# Patient Record
Sex: Female | Born: 1935 | Race: Black or African American | Hispanic: No | Smoking: Never smoker
Health system: Southern US, Community
[De-identification: ages and names within clinical notes are randomized; demographics above are authoritative.]

## PROBLEM LIST (undated history)

## (undated) DIAGNOSIS — M1990 Unspecified osteoarthritis, unspecified site: Secondary | ICD-10-CM

## (undated) DIAGNOSIS — C801 Malignant (primary) neoplasm, unspecified: Secondary | ICD-10-CM

## (undated) DIAGNOSIS — Z5189 Encounter for other specified aftercare: Secondary | ICD-10-CM

## (undated) DIAGNOSIS — N289 Disorder of kidney and ureter, unspecified: Secondary | ICD-10-CM

## (undated) DIAGNOSIS — Z992 Dependence on renal dialysis: Secondary | ICD-10-CM

## (undated) DIAGNOSIS — Z973 Presence of spectacles and contact lenses: Secondary | ICD-10-CM

## (undated) DIAGNOSIS — N186 End stage renal disease: Secondary | ICD-10-CM

## (undated) DIAGNOSIS — I10 Essential (primary) hypertension: Secondary | ICD-10-CM

## (undated) HISTORY — PX: COLONOSCOPY: SHX174

## (undated) HISTORY — DX: Unspecified osteoarthritis, unspecified site: M19.90

## (undated) HISTORY — DX: Encounter for other specified aftercare: Z51.89

## (undated) HISTORY — PX: APPENDECTOMY: SHX54

## (undated) HISTORY — PX: BREAST SURGERY: SHX581

## (undated) HISTORY — DX: Malignant (primary) neoplasm, unspecified: C80.1

## (undated) HISTORY — PX: ABDOMINAL HYSTERECTOMY: SHX81

## (undated) HISTORY — PX: TONSILLECTOMY: SUR1361

---

## 1999-05-04 ENCOUNTER — Other Ambulatory Visit: Admission: RE | Admit: 1999-05-04 | Discharge: 1999-05-04 | Payer: Self-pay | Admitting: Radiology

## 1999-05-04 ENCOUNTER — Encounter (INDEPENDENT_AMBULATORY_CARE_PROVIDER_SITE_OTHER): Payer: Self-pay | Admitting: Specialist

## 1999-05-22 HISTORY — PX: BREAST LUMPECTOMY WITH AXILLARY LYMPH NODE BIOPSY: SHX5593

## 1999-05-29 ENCOUNTER — Encounter (HOSPITAL_BASED_OUTPATIENT_CLINIC_OR_DEPARTMENT_OTHER): Payer: Self-pay | Admitting: General Surgery

## 1999-05-31 ENCOUNTER — Encounter (INDEPENDENT_AMBULATORY_CARE_PROVIDER_SITE_OTHER): Payer: Self-pay

## 1999-05-31 ENCOUNTER — Encounter (HOSPITAL_BASED_OUTPATIENT_CLINIC_OR_DEPARTMENT_OTHER): Payer: Self-pay | Admitting: General Surgery

## 1999-05-31 ENCOUNTER — Ambulatory Visit (HOSPITAL_COMMUNITY): Admission: RE | Admit: 1999-05-31 | Discharge: 1999-05-31 | Payer: Self-pay | Admitting: General Surgery

## 1999-06-28 ENCOUNTER — Encounter: Admission: RE | Admit: 1999-06-28 | Discharge: 1999-09-26 | Payer: Self-pay | Admitting: Radiation Oncology

## 2001-04-18 ENCOUNTER — Emergency Department (HOSPITAL_COMMUNITY): Admission: EM | Admit: 2001-04-18 | Discharge: 2001-04-19 | Payer: Self-pay | Admitting: *Deleted

## 2002-07-24 ENCOUNTER — Encounter: Payer: Self-pay | Admitting: Internal Medicine

## 2002-07-24 ENCOUNTER — Encounter: Admission: RE | Admit: 2002-07-24 | Discharge: 2002-07-24 | Payer: Self-pay | Admitting: Internal Medicine

## 2002-12-01 ENCOUNTER — Ambulatory Visit (HOSPITAL_COMMUNITY): Admission: RE | Admit: 2002-12-01 | Discharge: 2002-12-01 | Payer: Self-pay | Admitting: *Deleted

## 2002-12-01 ENCOUNTER — Encounter (INDEPENDENT_AMBULATORY_CARE_PROVIDER_SITE_OTHER): Payer: Self-pay | Admitting: Specialist

## 2004-07-11 ENCOUNTER — Ambulatory Visit: Payer: Self-pay | Admitting: Oncology

## 2004-08-04 ENCOUNTER — Ambulatory Visit (HOSPITAL_COMMUNITY): Admission: RE | Admit: 2004-08-04 | Discharge: 2004-08-04 | Payer: Self-pay | Admitting: *Deleted

## 2004-08-04 ENCOUNTER — Ambulatory Visit (HOSPITAL_BASED_OUTPATIENT_CLINIC_OR_DEPARTMENT_OTHER): Admission: RE | Admit: 2004-08-04 | Discharge: 2004-08-04 | Payer: Self-pay | Admitting: *Deleted

## 2009-11-14 ENCOUNTER — Ambulatory Visit: Payer: Self-pay | Admitting: Surgery

## 2009-12-13 ENCOUNTER — Ambulatory Visit: Payer: Self-pay | Admitting: Surgery

## 2009-12-13 ENCOUNTER — Ambulatory Visit (HOSPITAL_COMMUNITY): Admission: RE | Admit: 2009-12-13 | Discharge: 2009-12-13 | Payer: Self-pay | Admitting: Surgery

## 2010-01-02 ENCOUNTER — Ambulatory Visit: Payer: Self-pay | Admitting: Surgery

## 2010-01-30 ENCOUNTER — Ambulatory Visit: Payer: Self-pay | Admitting: Surgery

## 2010-02-20 ENCOUNTER — Ambulatory Visit: Payer: Self-pay | Admitting: Surgery

## 2010-03-31 ENCOUNTER — Encounter: Payer: Self-pay | Admitting: Emergency Medicine

## 2010-03-31 ENCOUNTER — Inpatient Hospital Stay (HOSPITAL_COMMUNITY): Admission: EM | Admit: 2010-03-31 | Discharge: 2010-04-18 | Payer: Self-pay | Admitting: Internal Medicine

## 2010-03-31 ENCOUNTER — Ambulatory Visit: Payer: Self-pay | Admitting: Emergency Medicine

## 2010-04-03 ENCOUNTER — Ambulatory Visit: Payer: Self-pay | Admitting: Vascular Surgery

## 2010-04-04 ENCOUNTER — Encounter: Payer: Self-pay | Admitting: Surgery

## 2010-05-16 ENCOUNTER — Emergency Department (HOSPITAL_COMMUNITY)
Admission: EM | Admit: 2010-05-16 | Discharge: 2010-05-16 | Payer: Self-pay | Source: Home / Self Care | Admitting: Emergency Medicine

## 2010-05-24 ENCOUNTER — Ambulatory Visit (HOSPITAL_COMMUNITY)
Admission: RE | Admit: 2010-05-24 | Discharge: 2010-05-24 | Payer: Self-pay | Source: Home / Self Care | Attending: Nephrology | Admitting: Nephrology

## 2010-05-24 ENCOUNTER — Emergency Department (HOSPITAL_COMMUNITY)
Admission: EM | Admit: 2010-05-24 | Discharge: 2010-05-24 | Payer: Self-pay | Source: Home / Self Care | Admitting: Emergency Medicine

## 2010-06-11 ENCOUNTER — Encounter: Payer: Self-pay | Admitting: Internal Medicine

## 2010-06-29 ENCOUNTER — Ambulatory Visit (INDEPENDENT_AMBULATORY_CARE_PROVIDER_SITE_OTHER): Payer: Medicare Other | Admitting: Vascular Surgery

## 2010-06-29 ENCOUNTER — Encounter (INDEPENDENT_AMBULATORY_CARE_PROVIDER_SITE_OTHER): Payer: Medicare Other

## 2010-06-29 DIAGNOSIS — N186 End stage renal disease: Secondary | ICD-10-CM

## 2010-06-29 DIAGNOSIS — T82898A Other specified complication of vascular prosthetic devices, implants and grafts, initial encounter: Secondary | ICD-10-CM

## 2010-07-03 NOTE — Assessment & Plan Note (Signed)
OFFICE VISIT  Pam, Fuller DOB:  [birth year]                                       06/29/2010 ZOXWR#:[id number]  CHIEF COMPLAINT:  Poorly functioning AV fistula.  HISTORY OF PRESENT ILLNESS:  The patient is a 77 year old female who previously had a left radiocephalic AV fistula placed in July of 2011. The fistula has never really functioned well and has had fairly poor flow.  She recently required catheter placement by Dr. Arbie Cookey in November of 2011.  She presents today for consideration for revision or placement of new access.  She currently dialyzes on Monday, Wednesday and Friday.  She has had no problems with her catheter during dialysis.  REVIEW OF SYSTEMS:  She denies any shortness of breath.  She denies any chest pain.  She denies any problems with prolonged bleeding.  PHYSICAL EXAM:  Blood pressure is 123/80 in the right arm, heart rate 102 and regular, respirations 16.  She has 2+ brachial and radial pulses bilaterally.  Clinically she has a thrombosed AV fistula in the left forearm.  She had a duplex ultrasound of her left upper extremity today which showed that the left forearm AV fistula is occluded.  However, the upper arm cephalic vein was patent.  The vein in the upper arm is fairly small between 24 and 30 mm in diameter.  On palpation with placement of a tourniquet again the vein does not seem large enough to consider for an AV fistula.  I believe the best option at this point would be to place an AV graft in her left upper extremity.  Will schedule this within the next few weeks.  Risks, benefits, possible complications and procedure details were explained to the patient including but not limited to bleeding, infection, graft thrombosis, ischemic steal.  She understands and agrees to proceed.    Janetta Hora. Fields, MD Electronically Signed  CEF/MEDQ  D:  06/30/2010  T:  06/30/2010  Job:  [id number]  cc:   Terrial Rhodes, M.D.

## 2010-07-14 NOTE — Procedures (Unsigned)
VASCULAR LAB EXAM  INDICATION:  Duplex/evaluation of left AV fistula.  HISTORY: Diabetes: Cardiac: Hypertension:  EXAM:  Left arm AV fistula with duplex.  IMPRESSION: 1. The left radial to cephalic AV fistula appears occluded from the     anastomosis to the proximal forearm level of the outflow vein.  The     cephalic vein from the antecubital fossa to the shoulder level     appears patent and compressible with nonpulsatile, spontaneous     phasic venous flow. 2. The left radial artery at the distal forearm and wrist levels     appears to be partially occluded with dampened Doppler waveforms     noted. 3.  Additional  measurements noted on the attached worksheet.   ___________________________________________ Janetta Hora. Fields, MD  CH/MEDQ  D:  06/30/2010  T:  06/30/2010  Job:  [id number]

## 2010-07-18 ENCOUNTER — Ambulatory Visit (HOSPITAL_COMMUNITY)
Admission: RE | Admit: 2010-07-18 | Discharge: 2010-07-18 | Disposition: A | Discharge status: Home / Self Care | Payer: Medicare Other | Source: Ambulatory Visit | Attending: Vascular Surgery | Admitting: Vascular Surgery

## 2010-07-18 DIAGNOSIS — D649 Anemia, unspecified: Secondary | ICD-10-CM | POA: Insufficient documentation

## 2010-07-18 DIAGNOSIS — I120 Hypertensive chronic kidney disease with stage 5 chronic kidney disease or end stage renal disease: Secondary | ICD-10-CM

## 2010-07-18 DIAGNOSIS — Z992 Dependence on renal dialysis: Secondary | ICD-10-CM | POA: Insufficient documentation

## 2010-07-18 DIAGNOSIS — N186 End stage renal disease: Secondary | ICD-10-CM

## 2010-07-18 DIAGNOSIS — Z01812 Encounter for preprocedural laboratory examination: Secondary | ICD-10-CM | POA: Insufficient documentation

## 2010-07-18 DIAGNOSIS — M109 Gout, unspecified: Secondary | ICD-10-CM | POA: Insufficient documentation

## 2010-07-18 LAB — POCT I-STAT 4, (NA,K, GLUC, HGB,HCT)
Glucose, Bld: 102 mg/dL — ABNORMAL HIGH (ref 70–99)
HCT: 41 % (ref 36.0–46.0)
Hemoglobin: 13.9 g/dL (ref 12.0–15.0)

## 2010-07-18 LAB — SURGICAL PCR SCREEN: MRSA, PCR: NEGATIVE

## 2010-07-24 NOTE — Op Note (Signed)
NAMEMEHR, DEPAOLI             ACCOUNT NO.:  [id number]  MEDICAL RECORD NO.:  [id number]           PATIENT TYPE:  O  LOCATION:  [facility]SC                         FACILITY:  [facility]  PHYSICIAN:  Janetta Hora. Fields, MD  DATE OF BIRTH:  1932-07-13  DATE OF PROCEDURE:  07/18/2010 DATE OF DISCHARGE:  07/18/2010                              OPERATIVE REPORT   PROCEDURE:  Left forearm arteriovenous graft.  PREOPERATIVE DIAGNOSIS:  End-stage renal disease.  POSTOPERATIVE DIAGNOSIS:  End-stage renal disease.  ANESTHESIA:  Local with IV sedation.  ASSISTANT:  Della Goo, PA-C  OPERATIVE FINDINGS:  A 4 to 7-mm tapered PTFE graft.  OPERATIVE DETAILS:  After obtaining informed consent, the patient was taken to the operating room.  The patient was placed in supine position on the operating room table.  After adequate sedation, the patient's entire left upper extremity was prepped and draped in usual sterile fashion.  Local anesthesia was infiltrated near the antecubital crease. A transverse incision was made in this location, carried down through subcutaneous tissues.  Cephalic vein was quite small and not adequate for use for creation of a fistula.  Laterally the basilic vein was examined and this was approximately 2 to 2.5 mm in diameter and I did not believe that this was a reasonable vein for creation of a basilic vein transposition.  The brachial artery was then dissected free more medial to this.  There was an adjacent deep brachial vein which was also 2.5 mm in diameter but was straighter with fewer side branches and appeared to be a better quality vein than the basilic vein.  This was dissected free circumferentially as well as the brachial artery.  Next, a loop configuration tunnel was created in the forearm with a transverse incision in the distal forearm for assistance and tunneling and a 4 to 7- mm tapered PTFE graft brought through the subcutaneous tunnel; 4 mm end of the  graft was on the ulnar aspect of the arm, 7-mm end on the radial aspect of the arm.  The patient was given 5000 units of intravenous heparin.  Vessel loops were used to control the artery proximally and distally.  A longitudinal opening was made in the brachial artery.  There was some calcification of the artery but overall it was soft in character; 4 mm end of the graft was beveled and sewn end graft to side of artery using a running 6- 0 Prolene suture.  Just prior to completion of anastomosis, this was fore bled, back bled and thoroughly flushed.  Anastomosis was secured, clamps released as pulsatile flow in the graft immediately.  Next, two small bulldog clamps were used to control the vein proximally and distally.  Longitudinal opening was made in the vein and the graft was beveled and sewn end of graft to side of vein using running 6-0 Prolene suture.  Just prior to completion of anastomosis, this was fore bled, back bled, and thoroughly flushed where anastomosis was secured, clamps were released, and there was palpable thrill above the graft immediately.  Hemostasis was then obtained with thrombin and Gelfoam and 50 mg of protamine.  After  hemostasis was obtained, the subcutaneous tissues of both incisions were reapproximated using running 3-0 Vicryl suture in the subcutaneous layer and a 4-0 Vicryl subcuticular stitch in the skin.  Dermabond was applied to both incisions.  The patient tolerated the procedure well and there were no complications.  Instrument, sponge, and needle counts were correct at the end of the case.  The patient was taken to the recovery room in stable condition.     Janetta Hora. Fields, MD     CEF/MEDQ  D:  07/18/2010  T:  07/19/2010  Job:  [id number]  Electronically Signed by Fabienne Bruns MD on 07/24/2010 11:02:27 AM

## 2010-07-31 LAB — CBC
MCH: 30.5 pg (ref 26.0–34.0)
Platelets: 134 10*3/uL — ABNORMAL LOW (ref 150–400)
RBC: 4.26 MIL/uL (ref 3.87–5.11)
RDW: 21.1 % — ABNORMAL HIGH (ref 11.5–15.5)
WBC: 7 10*3/uL (ref 4.0–10.5)

## 2010-07-31 LAB — BASIC METABOLIC PANEL
BUN: 14 mg/dL (ref 6–23)
Calcium: 9 mg/dL (ref 8.4–10.5)
Creatinine, Ser: 7.24 mg/dL — ABNORMAL HIGH (ref 0.4–1.2)
GFR calc Af Amer: 7 mL/min — ABNORMAL LOW (ref >60)

## 2010-07-31 LAB — POCT CARDIAC MARKERS
CKMB, poc: 2.5 ng/mL (ref 1.0–8.0)
Troponin i, poc: <0.05 ng/mL (ref 0.00–0.09)

## 2010-08-01 LAB — URINE CULTURE: Culture: NO GROWTH

## 2010-08-01 LAB — DIFFERENTIAL
Basophils Absolute: 0 10*3/uL (ref 0.0–0.1)
Lymphocytes Relative: 20 % (ref 12–46)
Monocytes Absolute: 0.1 10*3/uL (ref 0.1–1.0)
Neutro Abs: 5 10*3/uL (ref 1.7–7.7)
Neutrophils Relative %: 77 % (ref 43–77)

## 2010-08-01 LAB — URINALYSIS, ROUTINE W REFLEX MICROSCOPIC
Bilirubin Urine: NEGATIVE
Ketones, ur: NEGATIVE mg/dL
Nitrite: NEGATIVE
Specific Gravity, Urine: 1.013 (ref 1.005–1.030)
pH: 6 (ref 5.0–8.0)

## 2010-08-01 LAB — URINE MICROSCOPIC-ADD ON

## 2010-08-01 LAB — COMPREHENSIVE METABOLIC PANEL
Albumin: 4.1 g/dL (ref 3.5–5.2)
BUN: 97 mg/dL — ABNORMAL HIGH (ref 6–23)
Chloride: 103 mEq/L (ref 96–112)
Creatinine, Ser: 9.31 mg/dL — ABNORMAL HIGH (ref 0.4–1.2)
Total Bilirubin: 0.5 mg/dL (ref 0.3–1.2)

## 2010-08-01 LAB — CBC
MCH: 29.2 pg (ref 26.0–34.0)
MCV: 90.6 fL (ref 78.0–100.0)
Platelets: 246 10*3/uL (ref 150–400)
RBC: 4.62 MIL/uL (ref 3.87–5.11)
RDW: 18.2 % — ABNORMAL HIGH (ref 11.5–15.5)

## 2010-08-01 LAB — LIPASE, BLOOD: Lipase: 30 U/L (ref 11–59)

## 2010-08-02 LAB — CBC
HCT: 23.6 % — ABNORMAL LOW (ref 36.0–46.0)
HCT: 30.5 % — ABNORMAL LOW (ref 36.0–46.0)
HCT: 34.2 % — ABNORMAL LOW (ref 36.0–46.0)
HCT: 34.3 % — ABNORMAL LOW (ref 36.0–46.0)
HCT: 34.8 % — ABNORMAL LOW (ref 36.0–46.0)
HCT: 36.1 % (ref 36.0–46.0)
Hemoglobin: 10.4 g/dL — ABNORMAL LOW (ref 12.0–15.0)
Hemoglobin: 8.1 g/dL — ABNORMAL LOW (ref 12.0–15.0)
Hemoglobin: 8.7 g/dL — ABNORMAL LOW (ref 12.0–15.0)
Hemoglobin: 9.5 g/dL — ABNORMAL LOW (ref 12.0–15.0)
Hemoglobin: 9.7 g/dL — ABNORMAL LOW (ref 12.0–15.0)
MCH: 27.5 pg (ref 26.0–34.0)
MCH: 27.7 pg (ref 26.0–34.0)
MCH: 27.8 pg (ref 26.0–34.0)
MCH: 27.9 pg (ref 26.0–34.0)
MCH: 28 pg (ref 26.0–34.0)
MCH: 28.2 pg (ref 26.0–34.0)
MCH: 28.8 pg (ref 26.0–34.0)
MCHC: 30.6 g/dL (ref 30.0–36.0)
MCHC: 30.7 g/dL (ref 30.0–36.0)
MCHC: 30.8 g/dL (ref 30.0–36.0)
MCHC: 30.9 g/dL (ref 30.0–36.0)
MCHC: 31.7 g/dL (ref 30.0–36.0)
MCHC: 31.7 g/dL (ref 30.0–36.0)
MCHC: 31.8 g/dL (ref 30.0–36.0)
MCHC: 32.2 g/dL (ref 30.0–36.0)
MCV: 87 fL (ref 78.0–100.0)
MCV: 87.9 fL (ref 78.0–100.0)
MCV: 88.1 fL (ref 78.0–100.0)
MCV: 90.3 fL (ref 78.0–100.0)
MCV: 90.4 fL (ref 78.0–100.0)
MCV: 90.4 fL (ref 78.0–100.0)
Platelets: 187 10*3/uL (ref 150–400)
Platelets: 196 10*3/uL (ref 150–400)
Platelets: 209 10*3/uL (ref 150–400)
Platelets: 216 10*3/uL (ref 150–400)
Platelets: 231 10*3/uL (ref 150–400)
Platelets: 232 10*3/uL (ref 150–400)
Platelets: 254 10*3/uL (ref 150–400)
Platelets: 329 10*3/uL (ref 150–400)
Platelets: 344 10*3/uL (ref 150–400)
Platelets: 360 10*3/uL (ref 150–400)
RBC: 2.6 MIL/uL — ABNORMAL LOW (ref 3.87–5.11)
RBC: 2.61 MIL/uL — ABNORMAL LOW (ref 3.87–5.11)
RBC: 2.9 MIL/uL — ABNORMAL LOW (ref 3.87–5.11)
RBC: 3.02 MIL/uL — ABNORMAL LOW (ref 3.87–5.11)
RBC: 3.46 MIL/uL — ABNORMAL LOW (ref 3.87–5.11)
RBC: 3.85 MIL/uL — ABNORMAL LOW (ref 3.87–5.11)
RDW: 17.1 % — ABNORMAL HIGH (ref 11.5–15.5)
RDW: 17.2 % — ABNORMAL HIGH (ref 11.5–15.5)
RDW: 17.2 % — ABNORMAL HIGH (ref 11.5–15.5)
RDW: 17.3 % — ABNORMAL HIGH (ref 11.5–15.5)
RDW: 17.3 % — ABNORMAL HIGH (ref 11.5–15.5)
RDW: 17.3 % — ABNORMAL HIGH (ref 11.5–15.5)
RDW: 17.4 % — ABNORMAL HIGH (ref 11.5–15.5)
RDW: 17.5 % — ABNORMAL HIGH (ref 11.5–15.5)
RDW: 17.7 % — ABNORMAL HIGH (ref 11.5–15.5)
RDW: 17.8 % — ABNORMAL HIGH (ref 11.5–15.5)
WBC: 12.2 10*3/uL — ABNORMAL HIGH (ref 4.0–10.5)
WBC: 12.2 10*3/uL — ABNORMAL HIGH (ref 4.0–10.5)
WBC: 20.9 10*3/uL — ABNORMAL HIGH (ref 4.0–10.5)
WBC: 5.5 10*3/uL (ref 4.0–10.5)
WBC: 5.8 10*3/uL (ref 4.0–10.5)
WBC: 6.8 10*3/uL (ref 4.0–10.5)
WBC: 9 10*3/uL (ref 4.0–10.5)
WBC: 9.6 10*3/uL (ref 4.0–10.5)
WBC: 9.8 10*3/uL (ref 4.0–10.5)

## 2010-08-02 LAB — RENAL FUNCTION PANEL
Albumin: 2.2 g/dL — ABNORMAL LOW (ref 3.5–5.2)
Albumin: 2.7 g/dL — ABNORMAL LOW (ref 3.5–5.2)
Albumin: 2.9 g/dL — ABNORMAL LOW (ref 3.5–5.2)
Albumin: 3.3 g/dL — ABNORMAL LOW (ref 3.5–5.2)
BUN: 114 mg/dL — ABNORMAL HIGH (ref 6–23)
BUN: 122 mg/dL — ABNORMAL HIGH (ref 6–23)
BUN: 34 mg/dL — ABNORMAL HIGH (ref 6–23)
BUN: 71 mg/dL — ABNORMAL HIGH (ref 6–23)
BUN: 73 mg/dL — ABNORMAL HIGH (ref 6–23)
CO2: 20 mEq/L (ref 19–32)
CO2: 20 mEq/L (ref 19–32)
CO2: 24 mEq/L (ref 19–32)
CO2: 24 mEq/L (ref 19–32)
CO2: 26 mEq/L (ref 19–32)
Calcium: 7 mg/dL — ABNORMAL LOW (ref 8.4–10.5)
Calcium: 7.2 mg/dL — ABNORMAL LOW (ref 8.4–10.5)
Calcium: 7.6 mg/dL — ABNORMAL LOW (ref 8.4–10.5)
Calcium: 7.7 mg/dL — ABNORMAL LOW (ref 8.4–10.5)
Calcium: 8.2 mg/dL — ABNORMAL LOW (ref 8.4–10.5)
Calcium: 8.3 mg/dL — ABNORMAL LOW (ref 8.4–10.5)
Calcium: 8.6 mg/dL (ref 8.4–10.5)
Chloride: 89 mEq/L — ABNORMAL LOW (ref 96–112)
Chloride: 90 mEq/L — ABNORMAL LOW (ref 96–112)
Chloride: 90 mEq/L — ABNORMAL LOW (ref 96–112)
Creatinine, Ser: 6.8 mg/dL — ABNORMAL HIGH (ref 0.4–1.2)
Creatinine, Ser: 7 mg/dL — ABNORMAL HIGH (ref 0.4–1.2)
Creatinine, Ser: 7.55 mg/dL — ABNORMAL HIGH (ref 0.4–1.2)
Creatinine, Ser: 8.61 mg/dL — ABNORMAL HIGH (ref 0.4–1.2)
Creatinine, Ser: 9.27 mg/dL — ABNORMAL HIGH (ref 0.4–1.2)
Creatinine, Ser: 9.49 mg/dL — ABNORMAL HIGH (ref 0.4–1.2)
GFR calc Af Amer: 5 mL/min — ABNORMAL LOW (ref >60)
GFR calc Af Amer: 5 mL/min — ABNORMAL LOW (ref >60)
GFR calc Af Amer: 5 mL/min — ABNORMAL LOW (ref >60)
GFR calc Af Amer: 6 mL/min — ABNORMAL LOW (ref >60)
GFR calc Af Amer: 7 mL/min — ABNORMAL LOW (ref >60)
GFR calc Af Amer: 9 mL/min — ABNORMAL LOW (ref >60)
GFR calc non Af Amer: 4 mL/min — ABNORMAL LOW (ref >60)
GFR calc non Af Amer: 4 mL/min — ABNORMAL LOW (ref >60)
GFR calc non Af Amer: 5 mL/min — ABNORMAL LOW (ref >60)
GFR calc non Af Amer: 5 mL/min — ABNORMAL LOW (ref >60)
GFR calc non Af Amer: 6 mL/min — ABNORMAL LOW (ref >60)
GFR calc non Af Amer: 7 mL/min — ABNORMAL LOW (ref >60)
Glucose, Bld: 112 mg/dL — ABNORMAL HIGH (ref 70–99)
Glucose, Bld: 118 mg/dL — ABNORMAL HIGH (ref 70–99)
Glucose, Bld: 136 mg/dL — ABNORMAL HIGH (ref 70–99)
Glucose, Bld: 139 mg/dL — ABNORMAL HIGH (ref 70–99)
Phosphorus: 3.1 mg/dL (ref 2.3–4.6)
Phosphorus: 3.4 mg/dL (ref 2.3–4.6)
Phosphorus: 4 mg/dL (ref 2.3–4.6)
Phosphorus: 4.2 mg/dL (ref 2.3–4.6)
Phosphorus: 6.5 mg/dL — ABNORMAL HIGH (ref 2.3–4.6)
Phosphorus: 6.8 mg/dL — ABNORMAL HIGH (ref 2.3–4.6)
Potassium: 3.3 mEq/L — ABNORMAL LOW (ref 3.5–5.1)
Potassium: 4.2 mEq/L (ref 3.5–5.1)
Potassium: 4.6 mEq/L (ref 3.5–5.1)
Sodium: 129 mEq/L — ABNORMAL LOW (ref 135–145)
Sodium: 131 mEq/L — ABNORMAL LOW (ref 135–145)
Sodium: 139 mEq/L (ref 135–145)

## 2010-08-02 LAB — COMPREHENSIVE METABOLIC PANEL
ALT: 19 U/L (ref 0–35)
ALT: 22 U/L (ref 0–35)
AST: 61 U/L — ABNORMAL HIGH (ref 0–37)
Albumin: 2 g/dL — ABNORMAL LOW (ref 3.5–5.2)
Albumin: 3 g/dL — ABNORMAL LOW (ref 3.5–5.2)
Albumin: 3.3 g/dL — ABNORMAL LOW (ref 3.5–5.2)
Alkaline Phosphatase: 287 U/L — ABNORMAL HIGH (ref 39–117)
Alkaline Phosphatase: 91 U/L (ref 39–117)
BUN: 47 mg/dL — ABNORMAL HIGH (ref 6–23)
CO2: 26 mEq/L (ref 19–32)
Calcium: 7.5 mg/dL — ABNORMAL LOW (ref 8.4–10.5)
Calcium: 7.8 mg/dL — ABNORMAL LOW (ref 8.4–10.5)
Chloride: 95 mEq/L — ABNORMAL LOW (ref 96–112)
Creatinine, Ser: 4.64 mg/dL — ABNORMAL HIGH (ref 0.4–1.2)
Creatinine, Ser: 7.82 mg/dL — ABNORMAL HIGH (ref 0.4–1.2)
GFR calc Af Amer: 11 mL/min — ABNORMAL LOW (ref >60)
GFR calc Af Amer: 5 mL/min — ABNORMAL LOW (ref >60)
GFR calc non Af Amer: 9 mL/min — ABNORMAL LOW (ref >60)
Potassium: 4.5 mEq/L (ref 3.5–5.1)
Potassium: 4.9 mEq/L (ref 3.5–5.1)
Sodium: 139 mEq/L (ref 135–145)
Total Bilirubin: 0.3 mg/dL (ref 0.3–1.2)
Total Bilirubin: 1.3 mg/dL — ABNORMAL HIGH (ref 0.3–1.2)
Total Protein: 6.4 g/dL (ref 6.0–8.3)
Total Protein: 6.5 g/dL (ref 6.0–8.3)

## 2010-08-02 LAB — BASIC METABOLIC PANEL
BUN: 50 mg/dL — ABNORMAL HIGH (ref 6–23)
Calcium: 8.3 mg/dL — ABNORMAL LOW (ref 8.4–10.5)
Creatinine, Ser: 5.45 mg/dL — ABNORMAL HIGH (ref 0.4–1.2)
GFR calc Af Amer: 7 mL/min — ABNORMAL LOW (ref >60)
GFR calc non Af Amer: 6 mL/min — ABNORMAL LOW (ref >60)
GFR calc non Af Amer: 8 mL/min — ABNORMAL LOW (ref >60)
Glucose, Bld: 126 mg/dL — ABNORMAL HIGH (ref 70–99)
Potassium: 3.5 mEq/L (ref 3.5–5.1)
Sodium: 133 mEq/L — ABNORMAL LOW (ref 135–145)

## 2010-08-02 LAB — PROTIME-INR
INR: 1.13 (ref 0.00–1.49)
INR: 1.31 (ref 0.00–1.49)
INR: 1.34 (ref 0.00–1.49)
Prothrombin Time: 14.7 seconds (ref 11.6–15.2)
Prothrombin Time: 16.5 seconds — ABNORMAL HIGH (ref 11.6–15.2)
Prothrombin Time: 16.8 seconds — ABNORMAL HIGH (ref 11.6–15.2)

## 2010-08-02 LAB — CROSSMATCH
Antibody Screen: NEGATIVE
Unit division: 0

## 2010-08-02 LAB — CLOSTRIDIUM DIFFICILE BY PCR

## 2010-08-02 LAB — AMYLASE: Amylase: 148 U/L — ABNORMAL HIGH (ref 0–105)

## 2010-08-02 LAB — HEPATITIS PANEL, ACUTE
HCV Ab: NEGATIVE
Hep A IgM: NEGATIVE

## 2010-08-02 LAB — PTH, INTACT AND CALCIUM
Calcium, Total (PTH): 7 mg/dL — ABNORMAL LOW (ref 8.4–10.5)
PTH: 406.1 pg/mL — ABNORMAL HIGH (ref 14.0–72.0)

## 2010-08-02 LAB — DIFFERENTIAL
Eosinophils Relative: 0 % (ref 0–5)
Lymphocytes Relative: 10 % — ABNORMAL LOW (ref 12–46)
Lymphs Abs: 0.7 10*3/uL (ref 0.7–4.0)
Monocytes Absolute: 0.3 10*3/uL (ref 0.1–1.0)
Monocytes Relative: 4 % (ref 3–12)
Neutro Abs: 5.9 10*3/uL (ref 1.7–7.7)

## 2010-08-02 LAB — GLUCOSE, CAPILLARY
Glucose-Capillary: 135 mg/dL — ABNORMAL HIGH (ref 70–99)
Glucose-Capillary: 96 mg/dL (ref 70–99)

## 2010-08-02 LAB — CLOSTRIDIUM DIFFICILE EIA

## 2010-08-02 LAB — CULTURE, BLOOD (ROUTINE X 2)
Culture  Setup Time: 201111171339
Culture  Setup Time: 201111211912
Culture: NO GROWTH

## 2010-08-02 LAB — HEPATIC FUNCTION PANEL
Bilirubin, Direct: 0.3 mg/dL (ref 0.0–0.3)
Indirect Bilirubin: 0.5 mg/dL (ref 0.3–0.9)
Total Protein: 6.7 g/dL (ref 6.0–8.3)

## 2010-08-02 LAB — IRON AND TIBC
Iron: 84 ug/dL (ref 42–135)
TIBC: 178 ug/dL — ABNORMAL LOW (ref 250–470)

## 2010-08-02 LAB — FERRITIN: Ferritin: 251 ng/mL (ref 10–291)

## 2010-08-02 LAB — HEPATITIS B CORE ANTIBODY, TOTAL: Hep B Core Total Ab: NEGATIVE

## 2010-08-02 LAB — ABO/RH: ABO/RH(D): O NEG

## 2010-08-02 LAB — LIPASE, BLOOD: Lipase: 23 U/L (ref 11–59)

## 2010-08-02 LAB — HEMOGLOBIN A1C
Hgb A1c MFr Bld: 5.4 % (ref <5.7)
Mean Plasma Glucose: 108 mg/dL (ref <117)

## 2010-08-02 LAB — LIPID PANEL
HDL: 50 mg/dL (ref >39)
Triglycerides: 192 mg/dL — ABNORMAL HIGH (ref <150)
VLDL: 38 mg/dL (ref 0–40)

## 2010-08-02 LAB — NA AND K (SODIUM & POTASSIUM), RAND UR
Potassium Urine: 41 mEq/L
Sodium, Ur: 37 mEq/L

## 2010-08-02 LAB — HEPATITIS B SURFACE ANTIBODY,QUALITATIVE: Hep B S Ab: NEGATIVE

## 2010-08-05 LAB — SURGICAL PCR SCREEN
MRSA, PCR: NEGATIVE
Staphylococcus aureus: NEGATIVE

## 2010-08-31 ENCOUNTER — Ambulatory Visit (HOSPITAL_COMMUNITY): Payer: Medicare Other | Attending: Vascular Surgery

## 2010-08-31 DIAGNOSIS — I120 Hypertensive chronic kidney disease with stage 5 chronic kidney disease or end stage renal disease: Secondary | ICD-10-CM

## 2010-08-31 DIAGNOSIS — N186 End stage renal disease: Secondary | ICD-10-CM | POA: Insufficient documentation

## 2010-08-31 DIAGNOSIS — Z452 Encounter for adjustment and management of vascular access device: Secondary | ICD-10-CM | POA: Insufficient documentation

## 2010-10-03 NOTE — Assessment & Plan Note (Signed)
OFFICE VISIT   LURAE, HORNBROOK  DOB:  June 27, 1932                                       02/20/2010  JJOAC#:[id number]   REASON FOR VISIT:  Follow up fistula.   HISTORY:  This is a 77 year old female not yet on dialysis who underwent  left radiocephalic fistula on December 13, 2009.  She developed mild steal  post procedure.  She comes back in today for followup.  She has been  doing exercises.  She says that her hand feels tremendously better.   On examination, I can feel a thrill in her fistula up to the antecubital  crease.  She has 4/5 grip strength.  Sensation is intact.  I am a little  concerned her fistula is on the deep side.   I will plan on having the patient come back to see me in 6 weeks.  We  will get a ultrasound her fistula at that time.  We discussed today  possibly defatting/transposition of her fistula based on what her  ultrasound looks like.  This will be in 6 weeks.     Jorge Ny, MD  Electronically Signed   VWB/MEDQ  D:  02/20/2010  T:  02/21/2010  Job:  [id number]   cc:   Mindi Slicker. Lowell Guitar, M.D.

## 2010-10-03 NOTE — Assessment & Plan Note (Signed)
OFFICE VISIT   Pam Fuller, Pam Fuller  DOB:  1932/05/31                                       01/30/2010  IEPPI#:[id number]   The patient comes back today for followup of her left radiocephalic  fistula.  This was placed on December 13, 2009.  I was concerned of a mild  steal syndrome; therefore, I have encouraged her to do exercise and come  back to see me today.  She states that she has had very little change in  her symptoms, which are mainly swelling and pain in the joint space of  the index finger and thumb and fifth finger.  She denies having any  symptoms of a cold feeling or numbness.  She does have some weakness in  her left arm which is slightly worse than before her operation; however,  at baseline she has had issues with carpal tunnel syndrome.   PHYSICAL EXAMINATION:  Her incision is well-healed.  There is a palpable  radial pulse.  There is a thrill within the fistula, although it appears  to be somewhat on the deeper side.   I have ordered and reviewed her ultrasound, which shows an adequate-  sized cephalic vein measuring 0.4 to 0.6 mm.  There is retrograde flow  within the radial artery which changes to antegrade with fistula  compression.   I think the patient continues to have a mild form of steal syndrome.  She has no evidence of ischemia.  Some of her symptoms I do not feel are  related to her operation, particularly the joint pain that she is  feeling in her index and thumb finger.  I think she has some residual  carpal tunnel issues, with her ultrasound findings as well as her  symptom complaints that is definitely a component of steal but I do not  feel like it is strong enough to warrant taking down her fistula.  The  patient is in agreement with this.  She does not want to have this  removed at this time.  We will plan on seeing her back in 3 weeks.  I  have encouraged her continue to do her exercises more frequently.  Again, I will see  her back in 3 weeks.     Jorge Ny, MD  Electronically Signed   VWB/MEDQ  D:  01/30/2010  T:  01/31/2010  Job:  [id number]

## 2010-10-03 NOTE — Assessment & Plan Note (Signed)
OFFICE VISIT   Pam Fuller, Pam Fuller  DOB:  1933-02-26                                       11/14/2009  EAVWU#:[id number]   REASON FOR VISIT:  Dialysis access.   HISTORY:  This is a 77 year old female seen at the request of Dr. Lowell Guitar  for evaluation of renal failure and the need for access.  She is right-  handed.  She has stage V chronic kidney disease.  This patient suffers  from hypertension and dyslipidemia, which are medically managed.   REVIEW OF SYSTEMS:  GENERAL:  Negative for fevers, chills, weight gain  or weight loss.  CARDIAC:  Negative for chest pain.  PULMONARY:  Negative for shortness of breath.  GI:  Negative.  GU:  Positive for kidney disease.  VASCULAR:  Negative.  NEUROLOGIC:  Positive for occasional headaches.  MUSCULOSKELETAL:  Positive for arthritis and joint pain.  All other review of systems is negative.   PAST MEDICAL HISTORY:  Stage V chronic kidney disease, hypertension,  anemia, gout, dyslipidemia, obesity.   FAMILY HISTORY:  Negative for cardiovascular disease at an early age.   SOCIAL HISTORY:  Is a widow with 2 children.  Does not smoke, does not  drink.   ALLERGIES:  To mycins.   PHYSICAL EXAMINATION:  Heart rate is 64, blood pressure 174/80, O2  saturation is 98%.  Generally, she is well-appearing, in no distress.  HEENT:  Within normal limits.  Lungs are clear bilaterally.  Cardiovascular is a regular rate and rhythm.  Abdomen is soft but obese.  Musculoskeletal:  Without major deformities, minimal pedal edema.  Neuro:  No focal deficits.  Skin:  Without rash.  She has a palpable  left radial and brachial pulse.   DIAGNOSTICS:  I have ordered and independently reviewed her vein  mapping.  She has an adequate-sized left cephalic vein beginning at the  wrist.   ASSESSMENT/PLAN:  A 77 year old with chronic kidney disease, needing  access.   PLAN:  I have discussed with the patient proceeding with a left  radiocephalic fistula.  This has been scheduled for Tuesday, July 26.  The risks and benefits were discussed with the patient including the  risk of non-maturity, the risk of infection and the need for future  interventions including making her fistula more superficial.  All  questions of her questions were answered today.     Jorge Ny, MD  Electronically Signed   VWB/MEDQ  D:  11/14/2009  T:  11/15/2009  Job:  [id number]   cc:   Dr. Lowell Guitar

## 2010-10-03 NOTE — Procedures (Signed)
VASCULAR LAB EXAM   INDICATION:  :  Left hand pain, status post left AVF.    HISTORY:   EXAM:  Duplex of left upper extremity AVF.   IMPRESSION:  1. Patent left arteriovenous fistula with elevated velocities at the      anastomosis and the cephalic vein near the anastomosis.  2. Retrograde flow noted in the left radial artery distal to      anastomosis.  3. Branches and additional measurements as shown on attached drawing.    ___________________________________________  V. Charlena Cross, MD   AS/MEDQ  D:  01/30/2010  T:  01/30/2010  Job:  [id number]

## 2010-10-03 NOTE — Assessment & Plan Note (Signed)
OFFICE VISIT   VELTA, ROCKHOLT  DOB:  [birth year]                                       01/02/2010  ZOXWR#:[id number]   The patient comes back today.  She is status post left radiocephalic  fistula on July 26, 119.  There is good thrill within her forearm.  The  incision is well-healed.  She had been complaining of some pain and  swelling in her hand.  On examination her hand is warm and sensation is  intact.  She does have weak grip strength, however, this may have been  present baseline due to her  history of carpal tunnel release.   I have encouraged the patient to work with exercise balloon.  I will see  her back in 3 weeks.  I am concerned this  may be a mild steal syndrome,  however, I did not think any intervention is required at this time.  She  will see me in  3 weeks with an ultrasound to evaluate her for steal as  well as to evaluate her fistula.     Jorge Ny, MD  Electronically Signed   VWB/MEDQ  D:  01/02/2010  T:  01/03/2010  Job:  [id number]

## 2010-10-03 NOTE — Procedures (Signed)
CEPHALIC VEIN MAPPING   INDICATION:  AV fistula placement.   HISTORY:  Stage V chronic kidney disease.   EXAM:  The right cephalic and basilic veins are compressible.   Diameter measurements range from 0.49 to 0.36 in the cephalic vein and  0.61 to 0.29 in the basilic vein.   The left cephalic and basilic veins are compressible.   Diameter measurements range from 0.51 to 0.31 in the cephalic vein and  0.63 to 0.26 in the basilic vein.   See attached worksheet for all measurements.   IMPRESSION:  Patent right and left cephalic and basilic veins with the  diameters as noted above.   ___________________________________________  V. Pam Cross, MD   NT/MEDQ  D:  11/14/2009  T:  11/14/2009  Job:  [id number]

## 2010-10-06 NOTE — Op Note (Signed)
NAMEMONTANNA, MCBAIN             ACCOUNT NO.:  [id number]   MEDICAL RECORD NO.:  [id number]          PATIENT TYPE:  AMB   LOCATION:  [facility]SC                          FACILITY:  [facility]   PHYSICIAN:  Tennis Must Meyerdierks, M.D.DATE OF BIRTH:  12-Nov-1932   DATE OF PROCEDURE:  08/04/2004  DATE OF DISCHARGE:                                 OPERATIVE REPORT   PREOPERATIVE DIAGNOSIS:  Left carpal tunnel syndrome.   POSTOPERATIVE DIAGNOSIS:  Left carpal tunnel syndrome.   PROCEDURE:  Decompression, median nerve, left carpal tunnel.   SURGEON:  Lowell Bouton, M.D.   ANESTHESIA:  0.5% Marcaine local with sedation.   OPERATIVE FINDINGS:  The patient has had significant pressure on the nerve.  There were no masses in the carpal canal.  The motor branch of the nerve was  intact.   PROCEDURE:  Under 0.5% Marcaine local anesthesia with a tourniquet on the  left arm, the left hand was prepped and draped in the usual fashion and  after exsanguinating the limb, the tourniquet was inflated to 260 mmHg.  A 3 cm longitudinal incision was made in the palm just ulnar to the thenar  crease.  Sharp dissection was carried through the subcutaneous tissues.  Blunt dissection was carried through the superficial palmar fascia distal to  the transverse carpal ligament.  A hemostat was then placed in the carpal  canal up against the hook of the hamate and the transverse carpal ligament  was divided on the ulnar border of the median nerve.  The proximal end of  the ligament was divided with the scissors after dissecting the nerve away  from the undersurface of the ligament.  The carpal canal was then palpated  and was found to be adequately decompressed.  The nerve was examined and the  wound was irrigated with saline.  The skin was closed with 4-0 nylon  sutures.  Sterile dressings were applied, followed by a volar wrist splint.  The patient tolerated the procedure well and went to the recovery room  awake  and stable in good condition.      EMM/MEDQ  D:  08/04/2004  T:  08/04/2004  Job:  [id number]

## 2010-10-06 NOTE — Op Note (Signed)
[place]  Patient:    Pam Fuller                     MRN: [id number] Proc. Date: 05/31/99 Adm. Date:  [id number] Attending:  Sonda Primes CC:         Mardene Celeste. Lurene Shadow, M.D. (2)                           Operative Report  PREOPERATIVE DIAGNOSIS:  Carcinoma, left breast.  POSTOPERATIVE DIAGNOSIS:  Carcinoma, left breast.  OPERATION PERFORMED:  Lumpectomy with sentinel lymph node dissection.  SURGEON:  Mardene Celeste. Lurene Shadow, M.D.  ASSISTANT:  Ocie Cornfield, PA student.  ANESTHESIA:  General.  INDICATIONS FOR PROCEDURE:  The patient is a 77 year old woman presenting with n abnormal mammogram showing a stellate lesion within the right breast.  She underwent guided core needle biopsy which showed carcinoma.  She was brought to the operating room now for lumpectomy and sentinel lymph node dissection, possible axillary lymph node dissection following needle localization and radionuclide injection.  DESCRIPTION OF PROCEDURE:  Following the induction of satisfactory anesthesia, he region around the localizing needle was infiltrated with Lymphazurin dye 5 cc.  This area was massaged for approximately five minutes.  The breast was then prepped and draped to be included in a sterile operative field.  An elliptical incision  around the localizing needle was made and a wide wedge of breast tissue around he localizing needle was carried down to the anterior chest wall.  This was removed in its entirety and forwarded for pathologic evaluation.  Touch Preps on the margins were negative.  Attention was then turned to the left axilla where a transaxillary incision was made, deepened through the skin and subcutaneous tissues and with he use of a Neoprobe, an area of high radioactivity was pursued and this was carried down to a lymph node.  The first lymph node was not hot or blue.  The second lymph node was hot and not blue.  The  third lymph node was both hot and blue.  All three were forwarded for pathologic evaluation.  Touch Preps of these lymph nodes did not show any evidence of metastatic carcinoma.  Hemostasis was assured in both wounds. Sponge, instrument and sharp counts were verified.  In the axilla deep tissue was closed with interrupted 3-0 Vicryl and the skin closed with 5-0 Monocryl running subcuticular stitch.  The subcutaneous tissues were closed with interrupted 3-0  Vicryl sutures and skin closed with a 4-0 Monocryl running subcuticular stitch.  Both wounds were then reinforced with Steri-Strips and sterile compressive dressing applied.  Anesthetic reversed.  Patient removed from the operating room to the recovery room in stable condition having tolerated the procedure well. DD:  05/31/99 TD:  05/31/99 Job: [id number]

## 2011-01-24 ENCOUNTER — Other Ambulatory Visit: Payer: Self-pay

## 2011-01-24 ENCOUNTER — Encounter: Payer: Self-pay | Admitting: *Deleted

## 2011-01-24 ENCOUNTER — Emergency Department (HOSPITAL_BASED_OUTPATIENT_CLINIC_OR_DEPARTMENT_OTHER)
Admission: EM | Admit: 2011-01-24 | Discharge: 2011-01-24 | Disposition: A | Discharge status: Home / Self Care | Payer: Medicare Other | Attending: Emergency Medicine | Admitting: Emergency Medicine

## 2011-01-24 DIAGNOSIS — N186 End stage renal disease: Secondary | ICD-10-CM | POA: Insufficient documentation

## 2011-01-24 DIAGNOSIS — I120 Hypertensive chronic kidney disease with stage 5 chronic kidney disease or end stage renal disease: Secondary | ICD-10-CM | POA: Insufficient documentation

## 2011-01-24 DIAGNOSIS — Z992 Dependence on renal dialysis: Secondary | ICD-10-CM | POA: Insufficient documentation

## 2011-01-24 DIAGNOSIS — R1110 Vomiting, unspecified: Secondary | ICD-10-CM | POA: Insufficient documentation

## 2011-01-24 HISTORY — DX: Essential (primary) hypertension: I10

## 2011-01-24 HISTORY — DX: Disorder of kidney and ureter, unspecified: N28.9

## 2011-01-24 LAB — CBC
Hemoglobin: 11.7 g/dL — ABNORMAL LOW (ref 12.0–15.0)
MCH: 29.9 pg (ref 26.0–34.0)
MCV: 91.3 fL (ref 78.0–100.0)
RBC: 3.91 MIL/uL (ref 3.87–5.11)

## 2011-01-24 LAB — COMPREHENSIVE METABOLIC PANEL
ALT: 11 U/L (ref 0–35)
CO2: 29 mEq/L (ref 19–32)
Calcium: 10.7 mg/dL — ABNORMAL HIGH (ref 8.4–10.5)
Creatinine, Ser: 3.9 mg/dL — ABNORMAL HIGH (ref 0.50–1.10)
GFR calc Af Amer: 14 mL/min — ABNORMAL LOW (ref >60)
GFR calc non Af Amer: 11 mL/min — ABNORMAL LOW (ref >60)
Glucose, Bld: 167 mg/dL — ABNORMAL HIGH (ref 70–99)

## 2011-01-24 MED ORDER — ONDANSETRON HCL 4 MG PO TABS: 4.0000 mg | ORAL_TABLET | Freq: Four times a day (QID) | ORAL | Status: AC

## 2011-01-24 MED ORDER — ONDANSETRON 4 MG PO TBDP
4.0000 mg | ORAL_TABLET | Freq: Once | ORAL | Status: AC
  Administered 2011-01-24: 4 mg via ORAL
  Filled 2011-01-24: qty 1

## 2011-01-24 NOTE — ED Notes (Signed)
Pt to room 9 by ems via stretcher, pt reports having dialysis today, going to eat at [employer] afterwards, and having an episode of vomiting at the restaurant. Pt is now a/a/ox4, with no c/o, states this happens after dialysis sometimes and then she feels fine. Son at bedside.

## 2011-01-24 NOTE — ED Notes (Signed)
EMS advised me that patient was eating when episode began and after she went thru dialysis this afternoon. EMS stated patient had good ECG, but they had not checked her blood sugar as she is not diabetic. Patient was covered in vomit, mostly food particles. I helped wash her feet and get into gown. I replaced new sheets and chux.

## 2011-01-24 NOTE — ED Notes (Signed)
Pt given gingerale and encouraged to take small sips

## 2011-01-24 NOTE — ED Notes (Signed)
Pt has tolerated PO fluids. Pt also ambulated around room without difficulty. Will notify MD

## 2011-01-25 NOTE — ED Provider Notes (Signed)
History     CSN: [id number] Arrival date & time: 01/24/2011  6:52 PM  Chief Complaint  Patient presents with  . Nausea  . Emesis   HPI Comments: History of end-stage renal disease on dialysis, gout, hypertension presenting with vomiting and near syncope while at dinner at a restaurant.  The patient completed her full hour four-hour run of dialysis this afternoon and then went out to eat with her family. After eating a full meal she began to feel nauseated and had one episode of emesis, fell lightheaded and fell as she was like she is going to pass out. She now actually is consciousness. She denies any chest pain or shortness of breath. Paramedics were called and patient was transported here. She feels back to normal now. She has had vomiting after dialysis in the past. She denies any fevers, cough, abdominal pain, diarrhea. She does make a little bit of urine at baseline.  The history is provided by the patient and the EMS personnel.    Past Medical History  Diagnosis Date  . Hypertension   . Renal disorder     History reviewed. No pertinent past surgical history.  History reviewed. No pertinent family history.  History  Substance Use Topics  . Smoking status: Not on file  . Smokeless tobacco: Not on file  . Alcohol Use:     OB History    Grav Para Term Preterm Abortions TAB SAB Ect Mult Living                  Review of Systems  Constitutional: Positive for activity change and appetite change. Negative for fever.  HENT: Negative for congestion and rhinorrhea.   Respiratory: Negative for cough and shortness of breath.   Cardiovascular: Negative for chest pain.  Gastrointestinal: Positive for nausea and vomiting. Negative for abdominal pain and diarrhea.  Genitourinary: Negative for dysuria and hematuria.  Musculoskeletal: Negative for back pain.  Neurological: Negative for weakness and headaches.  Psychiatric/Behavioral: Negative.     Physical Exam  BP 102/59  Pulse  90  Temp(Src) 98.4 F (36.9 C) (Oral)  Resp 21  SpO2 96%  Physical Exam  Constitutional: She is oriented to person, place, and time. She appears well-developed and well-nourished. No distress.  HENT:  Head: Normocephalic and atraumatic.  Mouth/Throat: Oropharynx is clear and moist. No oropharyngeal exudate.  Eyes: Conjunctivae are normal. Pupils are equal, round, and reactive to light.  Neck: Normal range of motion.  Cardiovascular: Normal rate, regular rhythm and normal heart sounds.   Pulmonary/Chest: Effort normal and breath sounds normal. No respiratory distress.  Abdominal: Soft. There is no tenderness. There is no rebound and no guarding.  Musculoskeletal: Normal range of motion. She exhibits no edema and no tenderness.       Left forearm fistula with positive bruit and thrill  Neurological: She is alert and oriented to person, place, and time. No cranial nerve deficit.  Skin: Skin is warm.    ED Course  Procedures  MDM Vomiting and near syncopal episode. Back to baseline now. Abdomen soft and nontender We'll obtain baseline laboratory studies, EKG and treat symptoms with antiemetics.  Orthostatic vitals are positive, patient does not wish to have an IV and was able to take some hydration by mouth. She is ambulatory in the ED and stable for discharge home.    Date: 01/25/2011  Rate: 89  Rhythm: normal sinus rhythm  QRS Axis: normal  Intervals: QT prolonged  ST/T Wave abnormalities: nonspecific  ST changes  Conduction Disutrbances:none  Narrative Interpretation:   Old EKG Reviewed: unavailable  Results for orders placed during the hospital encounter of 01/24/11  CBC      Component Value Range   WBC 3.6 (*) 4.0 - 10.5 (K/uL)   RBC 3.91  3.87 - 5.11 (MIL/uL)   Hemoglobin 11.7 (*) 12.0 - 15.0 (g/dL)   HCT 09.8 (*) 11.9 - 46.0 (%)   MCV 91.3  78.0 - 100.0 (fL)   MCH 29.9  26.0 - 34.0 (pg)   MCHC 32.8  30.0 - 36.0 (g/dL)   RDW 14.7  82.9 - 56.2 (%)   Platelets 171   150 - 400 (K/uL)  COMPREHENSIVE METABOLIC PANEL      Component Value Range   Sodium 139  135 - 145 (mEq/L)   Potassium 4.9  3.5 - 5.1 (mEq/L)   Chloride 97  96 - 112 (mEq/L)   CO2 29  19 - 32 (mEq/L)   Glucose, Bld 167 (*) 70 - 99 (mg/dL)   BUN 18  6 - 23 (mg/dL)   Creatinine, Ser 1.30 (*) 0.50 - 1.10 (mg/dL)   Calcium 86.5 (*) 8.4 - 10.5 (mg/dL)   Total Protein 8.5 (*) 6.0 - 8.3 (g/dL)   Albumin 4.1  3.5 - 5.2 (g/dL)   AST 14  0 - 37 (U/L)   ALT 11  0 - 35 (U/L)   Alkaline Phosphatase 147 (*) 39 - 117 (U/L)   Total Bilirubin 0.6  0.3 - 1.2 (mg/dL)   GFR calc non Af Amer 11 (*) >60 (mL/min)   GFR calc Af Amer 14 (*) >60 (mL/min)  TROPONIN I      Component Value Range   Troponin I <0.30  <0.30 (ng/mL)   No results found.        Glynn Octave, MD 01/25/11 [phone]

## 2012-02-08 ENCOUNTER — Other Ambulatory Visit (HOSPITAL_COMMUNITY): Payer: Self-pay | Admitting: Nephrology

## 2012-02-08 DIAGNOSIS — N186 End stage renal disease: Secondary | ICD-10-CM

## 2012-02-09 ENCOUNTER — Other Ambulatory Visit (HOSPITAL_COMMUNITY): Payer: Self-pay | Admitting: Nephrology

## 2012-02-09 ENCOUNTER — Ambulatory Visit (HOSPITAL_COMMUNITY)
Admission: RE | Admit: 2012-02-09 | Discharge: 2012-02-09 | Disposition: A | Discharge status: Home / Self Care | Payer: Medicare Other | Source: Ambulatory Visit | Attending: Nephrology | Admitting: Nephrology

## 2012-02-09 DIAGNOSIS — Y832 Surgical operation with anastomosis, bypass or graft as the cause of abnormal reaction of the patient, or of later complication, without mention of misadventure at the time of the procedure: Secondary | ICD-10-CM | POA: Insufficient documentation

## 2012-02-09 DIAGNOSIS — T82898A Other specified complication of vascular prosthetic devices, implants and grafts, initial encounter: Secondary | ICD-10-CM | POA: Insufficient documentation

## 2012-02-09 DIAGNOSIS — I871 Compression of vein: Secondary | ICD-10-CM | POA: Insufficient documentation

## 2012-02-09 DIAGNOSIS — N186 End stage renal disease: Secondary | ICD-10-CM | POA: Insufficient documentation

## 2012-02-09 DIAGNOSIS — I120 Hypertensive chronic kidney disease with stage 5 chronic kidney disease or end stage renal disease: Secondary | ICD-10-CM | POA: Insufficient documentation

## 2012-02-09 LAB — POCT I-STAT 4, (NA,K, GLUC, HGB,HCT)
Glucose, Bld: 85 mg/dL (ref 70–99)
Potassium: 4.7 mEq/L (ref 3.5–5.1)
Sodium: 139 mEq/L (ref 135–145)

## 2012-02-09 MED ORDER — HEPARIN SODIUM (PORCINE) 1000 UNIT/ML IJ SOLN
INTRAMUSCULAR | Status: AC
  Administered 2012-02-09: 3000 [IU]
  Filled 2012-02-09: qty 1

## 2012-02-09 MED ORDER — ALTEPLASE 100 MG IV SOLR
4.0000 mg | INTRAVENOUS | Status: AC
  Administered 2012-02-09: 4 mg
  Filled 2012-02-09: qty 4

## 2012-02-09 MED ORDER — FENTANYL CITRATE 0.05 MG/ML IJ SOLN
INTRAMUSCULAR | Status: AC
  Filled 2012-02-09: qty 4

## 2012-02-09 MED ORDER — MIDAZOLAM HCL 2 MG/2ML IJ SOLN
INTRAMUSCULAR | Status: AC
  Filled 2012-02-09: qty 4

## 2012-02-09 MED ORDER — FENTANYL CITRATE 0.05 MG/ML IJ SOLN
INTRAMUSCULAR | Status: AC | PRN
  Administered 2012-02-09 (×2): 50 ug via INTRAVENOUS

## 2012-02-09 MED ORDER — MIDAZOLAM HCL 5 MG/5ML IJ SOLN
INTRAMUSCULAR | Status: AC | PRN
  Administered 2012-02-09 (×2): 1 mg via INTRAVENOUS

## 2012-02-09 MED ORDER — IOHEXOL 300 MG/ML  SOLN: 150.0000 mL | Freq: Once | INTRAMUSCULAR | Status: AC | PRN

## 2012-02-09 NOTE — Procedures (Signed)
Technically successful declot of left forearm dialysis graft.  Persistent irregular narrowing within main outflow vein required Rx with deployment of uncovered stent.  No immediate post procedural complications.

## 2012-02-09 NOTE — H&P (Signed)
Pam Fuller is an 78 y.o. female.   Chief Complaint: left lower arm graft clotted Last used 9/18: no probs Placed "2 yrs ago" No other interventions Scheduled for thrombolysis and possible angioplasty/stent placement. Possible dialysis catheter placement if needed HPI: HTN; ESRD  Past Medical History  Diagnosis Date  . Hypertension   . Renal disorder     No past surgical history on file.  No family history on file. Social History:  does not have a smoking history on file. She does not have any smokeless tobacco history on file. Her alcohol and drug histories not on file.  Allergies:  Allergies  Allergen Reactions  . Erythromycin (Erythromycin)      (Not in a hospital admission)  No results found for this or any previous visit (from the past 48 hour(s)). No results found.  Review of Systems  Constitutional: Negative for fever.  Respiratory: Negative for shortness of breath.   Cardiovascular: Negative for chest pain.  Gastrointestinal: Negative for nausea, vomiting and abdominal pain.  Neurological: Negative for headaches.    Blood pressure 174/113, pulse 59, temperature 98.1 F (36.7 C), resp. rate 12, SpO2 100.00%. Physical Exam  Constitutional: She is oriented to person, place, and time. She appears well-developed and well-nourished.  Cardiovascular: Normal rate, regular rhythm and normal heart sounds.   No murmur heard. Respiratory: Effort normal and breath sounds normal. She has no wheezes.  GI: Soft. Bowel sounds are normal. There is no tenderness.  Musculoskeletal: Normal range of motion.       Left lower arm no thrill or pulse  Neurological: She is alert and oriented to person, place, and time.  Skin: Skin is warm and dry.  Psychiatric: She has a normal mood and affect. Her behavior is normal. Judgment and thought content normal.     Assessment/Plan Left low arm dialysis graft clotted Scheduled for thrombolysis and poss pta/stent Possible catheter  if needed Pt aware of procedure benefits and risks and agreeable to proceed Consent signed and in chart  Pam Fuller A 02/09/2012, 8:51 AM

## 2013-03-26 ENCOUNTER — Other Ambulatory Visit: Payer: Self-pay | Admitting: Radiology

## 2013-04-07 ENCOUNTER — Encounter (INDEPENDENT_AMBULATORY_CARE_PROVIDER_SITE_OTHER): Payer: Self-pay | Admitting: General Surgery

## 2013-04-07 ENCOUNTER — Ambulatory Visit (INDEPENDENT_AMBULATORY_CARE_PROVIDER_SITE_OTHER): Payer: Medicare Other | Admitting: General Surgery

## 2013-04-07 ENCOUNTER — Encounter (INDEPENDENT_AMBULATORY_CARE_PROVIDER_SITE_OTHER): Payer: Self-pay

## 2013-04-07 VITALS — BP 130/72 | HR 78 | Temp 99.2°F | Resp 16 | Ht 62.0 in | Wt 148.0 lb

## 2013-04-07 DIAGNOSIS — C50419 Malignant neoplasm of upper-outer quadrant of unspecified female breast: Secondary | ICD-10-CM

## 2013-04-07 DIAGNOSIS — M109 Gout, unspecified: Secondary | ICD-10-CM

## 2013-04-07 DIAGNOSIS — I10 Essential (primary) hypertension: Secondary | ICD-10-CM | POA: Insufficient documentation

## 2013-04-07 DIAGNOSIS — N186 End stage renal disease: Secondary | ICD-10-CM

## 2013-04-07 DIAGNOSIS — K219 Gastro-esophageal reflux disease without esophagitis: Secondary | ICD-10-CM

## 2013-04-07 DIAGNOSIS — Z170 Estrogen receptor positive status [ER+]: Secondary | ICD-10-CM | POA: Insufficient documentation

## 2013-04-07 DIAGNOSIS — C50412 Malignant neoplasm of upper-outer quadrant of left female breast: Secondary | ICD-10-CM

## 2013-04-07 NOTE — Progress Notes (Addendum)
Patient ID: Pam Fuller, female   DOB: January 25, 1933, 79 y.o.   MRN: [id number]  Chief Complaint  Patient presents with  . New Evaluation    eval lt br ca    HPI Pam Fuller is a 79 y.o. female.  She is referred by Dr. Rogelia Mire at [facility] for diagnosis of a recurrent left breast cancer.  This very pleasant 79 year old woman is end-stage renal disease on dialysis for 2 years but she is independent, drive her car, and lives with one of her 2 sons. She is followed by Dr. Kellie Shropshire for primary care and Dr. Jomarie LongsValentina Lucks for nephrology.   She was diagnosed with cancer of the left breast, upper outer quadrant in 2001. On 05/31/1999 she underwent left partial mastectomy and left axillary sentinel nerve biopsy by Dr. Dominga Ferry. We have those records demonstrating a 0.9 cm tumor, receptor positive, HER-2/neu negative, node negative. Pathologic stage TI B., N0. She received adjuvant chemotherapy as well as adjuvant radiation therapy. She said she got a bad burn in the lower outer quadrant of the left breast from the radiation therapy and had lots of desquamation. She says there is a hyperpigmented area there.  She went for screening mammograms at [facility]. She also had ultrasound. They found a 5 mm mass in the left breast at 3:00 position and a small cyst in the left axilla tail. Image guided biopsy of the 5 mm mass at 3:00 position shows invasive mammary carcinoma, lobular features, receptor positive, Ki-67 8%, HER-2/neu negative.  She is in no distress today. One of her sons is with her throughout the encounter  Comorbidities include end-stage renal disease, history of breast cancer, hypertension, gout, GERD.[name] HPI  Past Medical History  Diagnosis Date  . Hypertension   . Arthritis   . Blood transfusion without reported diagnosis   . Renal disorder     patient on dialysis  . Cancer     patient states she had br ca 13 years ago    Past Surgical History  Procedure  Laterality Date  . Abdominal hysterectomy    . Breast lumpectomy with axillary lymph node biopsy    . Appendectomy      Family History  Problem Relation Age of Onset  . Cancer Maternal Aunt     breast    Social History History  Substance Use Topics  . Smoking status: Never Smoker   . Smokeless tobacco: Never Used  . Alcohol Use: No    Allergies  Allergen Reactions  . Erythromycin [Erythromycin]     Current Outpatient Prescriptions  Medication Sig Dispense Refill  . amLODipine (NORVASC) 10 MG tablet       . cloNIDine (CATAPRES) 0.1 MG tablet       . colchicine 0.6 MG tablet Take 0.6 mg by mouth daily.        . fexofenadine-pseudoephedrine (ALLEGRA-D 24) 180-240 MG per 24 hr tablet Take 1 tablet by mouth daily.      . fluticasone (FLONASE) 50 MCG/ACT nasal spray Place 2 sprays into the nose daily.      [name] lisinopril (PRINIVIL,ZESTRIL) 40 MG tablet       . metoprolol tartrate (LOPRESSOR) 25 MG tablet Take 50 mg by mouth once.       [name] omeprazole (PRILOSEC OTC) 20 MG tablet Take 20 mg by mouth daily.       No current facility-administered medications for this visit.    Review of Systems Review of Systems  Constitutional: Positive for fatigue. Negative for fever, chills and unexpected weight change.  HENT: Negative for congestion, hearing loss, sore throat, trouble swallowing and voice change.   Eyes: Negative for visual disturbance.  Respiratory: Negative for cough and wheezing.   Cardiovascular: Negative for chest pain, palpitations and leg swelling.  Gastrointestinal: Negative for nausea, vomiting, abdominal pain, diarrhea, constipation, blood in stool, abdominal distention and anal bleeding.  Genitourinary: Positive for decreased urine volume. Negative for hematuria, vaginal bleeding and difficulty urinating.  Musculoskeletal: Negative for arthralgias.  Skin: Negative for rash and wound.  Neurological: Negative for seizures, syncope and headaches.  Hematological:  Negative for adenopathy. Does not bruise/bleed easily.  Psychiatric/Behavioral: Negative for confusion.    Blood pressure 130/72, pulse 78, temperature 99.2 F (37.3 C), temperature source Temporal, resp. rate 16, height 5\' 2"  (1.575 m), weight 148 lb (67.132 kg).  Physical Exam Physical Exam  Constitutional: She is oriented to person, place, and time. She appears well-developed and well-nourished. No distress.  HENT:  Head: Normocephalic and atraumatic.  Nose: Nose normal.  Mouth/Throat: No oropharyngeal exudate.  Eyes: Conjunctivae and EOM are normal. Pupils are equal, round, and reactive to light. Left eye exhibits no discharge. No scleral icterus.  Neck: Neck supple. No JVD present. No tracheal deviation present. No thyromegaly present.  Cardiovascular: Normal rate, regular rhythm, normal heart sounds and intact distal pulses.   No murmur heard. Pulmonary/Chest: Effort normal and breath sounds normal. No respiratory distress. She has no wheezes. She has no rales. She exhibits no tenderness.    Left breast smaller than right. Hyperpigmentation and burn scar lower outer quadrant left breast. Well-healed scar left breast upper outer quadrant and left axilla. There is no real palpable mass in either breast. No skin changes on the right. No axillary adenopathy on either side.  Abdominal: Soft. Bowel sounds are normal. She exhibits no distension and no mass. There is no tenderness. There is no rebound and no guarding.  Musculoskeletal: She exhibits no edema and no tenderness.  Lymphadenopathy:    She has no cervical adenopathy.  Neurological: She is alert and oriented to person, place, and time. She exhibits normal muscle tone. Coordination normal.  Skin: Skin is warm. No rash noted. She is not diaphoretic. No erythema. No pallor.  Psychiatric: She has a normal mood and affect. Her behavior is normal. Judgment and thought content normal.    Data Reviewed Old records from her first  breast cancer. Recent imaging studies and recent pathology report.  Assessment    New or recurrent Invasive mammary carcinoma left breast, 3:00 position, 5 mm tumor, receptor positive, HER-2/neu negative.  13 years postop left partial mastectomy, sentinel node biopsy and adjuvant chemotherapy and radiation therapy. Her tumor was T1B, N0 at that point.  End-stage renal disease on dialysis for 2 years. AV fistula left upper extremity  Despite her comorbidities, she is quite functional and independent.  Hypertension  Gout  GERD     Plan    I had a very long conversation with the patient her son. I presented left total mastectomy as the standard of care for recurrent cancer in her breast. We talked about a lumpectomy and AE therapy, although I pointed out this is not the standard of care and we have very little data on the adequacy of this, but it could be considered.  She is concerned about a mastectomy but is willing to consider all of her options.  I have advised her that her mammograms are difficult  to interpret due to multiple densities and multiple calcifications, so she will be somewhat difficult to follow in the future.  I suggested  a second opinion, and she very much wants to have that. She will be referred to medical oncology for their advice regarding efficacy of  multidisciplinary therapy in her individual situation.  She will be presented at tumor Board tomorrow  Return to see me in approximately 2 weeks for final decision making.   We will need to coordinate her eventual surgery with her dialysis schedule, and if she needs to be admitted to the hospital will need to involve nephrology for inpatient dialysis.       Angelia Mould. Derrell Lolling, M.D., [facility] General and Minimally invasive Surgery Breast and Colorectal Surgery Office:   [phone] Pager:   [phone]  04/07/2013, 2:53 PM

## 2013-04-07 NOTE — Patient Instructions (Signed)
You have a new or recurrent cancer in your left breast laterally. You  had a lumpectomy, chemotherapy, and radiation therapy in 2001. There is no role for radiation therapy a second time.  We have talked about left simple mastectomy as the standard of care.  We have also talked about a lesser procedure, lumpectomy and antiestrogen therapy, although that is not the standard of care in this situation.  We have talked about how difficult it is to evaluate her mammograms with the multiple densities and multiple calcifications.  You and your son  have asked lots of good questions.  You will be referred to a medical oncologist for a second opinion regarding their advice regarding mastectomy versus a lumpectomy and antiestrogen therapy.  Your case will be presented at our breast cancer tumor board tomorrow.  Return to see Dr. Derrell Lolling in approximately 2 weeks.

## 2013-04-08 ENCOUNTER — Telehealth: Payer: Self-pay | Admitting: *Deleted

## 2013-04-08 ENCOUNTER — Encounter: Payer: Self-pay | Admitting: *Deleted

## 2013-04-08 NOTE — Progress Notes (Signed)
Received referral from Dr. Derrell Lolling at [facility] and pt is a previous pt of Dr. Darnelle Catalan and is requesting to see him again.  Gave paperwork to Dr. Darnelle Catalan for a date and time.

## 2013-04-08 NOTE — Telephone Encounter (Signed)
Received an appt from Dr. Darnelle Catalan.  Called and left a message for the pt or her son to return my call so I can get her scheduled.

## 2013-04-09 ENCOUNTER — Telehealth: Payer: Self-pay | Admitting: *Deleted

## 2013-04-09 NOTE — Telephone Encounter (Signed)
Pt returned my call and I confirmed 04/21/13 appt w/ pt.  Unable to schedule appt - emailed Dory Peru to open templates.  Mailed before appt letter, welcome packet & intake form to pt.  Emailed Huntley Dec at [facility] to make her aware.  Took paperwork to [facility] w/ a request for them to check storage for past history records.

## 2013-04-10 ENCOUNTER — Encounter (INDEPENDENT_AMBULATORY_CARE_PROVIDER_SITE_OTHER): Payer: Self-pay

## 2013-04-13 ENCOUNTER — Telehealth (INDEPENDENT_AMBULATORY_CARE_PROVIDER_SITE_OTHER): Payer: Self-pay

## 2013-04-13 NOTE — Telephone Encounter (Signed)
Left message for pt to call me.  We need to reschedule her to see Dr Derrell Lolling after she sees Dr Darnelle Catalan on 12/2.

## 2013-04-20 ENCOUNTER — Encounter: Payer: Self-pay | Admitting: *Deleted

## 2013-04-20 ENCOUNTER — Other Ambulatory Visit: Payer: Self-pay | Admitting: *Deleted

## 2013-04-20 DIAGNOSIS — C50412 Malignant neoplasm of upper-outer quadrant of left female breast: Secondary | ICD-10-CM

## 2013-04-20 NOTE — Progress Notes (Signed)
Completed chart.  Gave to [facility] to enter labs and give back so I can give to MD.

## 2013-04-21 ENCOUNTER — Ambulatory Visit (HOSPITAL_BASED_OUTPATIENT_CLINIC_OR_DEPARTMENT_OTHER): Payer: 59 | Admitting: Oncology

## 2013-04-21 ENCOUNTER — Other Ambulatory Visit: Payer: Self-pay | Admitting: Oncology

## 2013-04-21 ENCOUNTER — Encounter: Payer: Self-pay | Admitting: *Deleted

## 2013-04-21 ENCOUNTER — Ambulatory Visit: Payer: Medicare Other

## 2013-04-21 ENCOUNTER — Encounter: Payer: Self-pay | Admitting: Oncology

## 2013-04-21 ENCOUNTER — Encounter (INDEPENDENT_AMBULATORY_CARE_PROVIDER_SITE_OTHER): Payer: Medicare Other | Admitting: General Surgery

## 2013-04-21 VITALS — BP 174/61 | HR 80 | Temp 97.8°F | Resp 18 | Ht 62.0 in | Wt 167.9 lb

## 2013-04-21 DIAGNOSIS — Z170 Estrogen receptor positive status [ER+]: Secondary | ICD-10-CM

## 2013-04-21 DIAGNOSIS — C50412 Malignant neoplasm of upper-outer quadrant of left female breast: Secondary | ICD-10-CM

## 2013-04-21 DIAGNOSIS — I120 Hypertensive chronic kidney disease with stage 5 chronic kidney disease or end stage renal disease: Secondary | ICD-10-CM

## 2013-04-21 DIAGNOSIS — N186 End stage renal disease: Secondary | ICD-10-CM

## 2013-04-21 DIAGNOSIS — C50912 Malignant neoplasm of unspecified site of left female breast: Secondary | ICD-10-CM

## 2013-04-21 DIAGNOSIS — Z992 Dependence on renal dialysis: Secondary | ICD-10-CM

## 2013-04-21 DIAGNOSIS — C50419 Malignant neoplasm of upper-outer quadrant of unspecified female breast: Secondary | ICD-10-CM

## 2013-04-21 NOTE — Progress Notes (Signed)
Checked in new pt with no financial concerns. °

## 2013-04-21 NOTE — Progress Notes (Signed)
ID: Barron Schmid OB: 1932/09/16  MR#: [id number]  WUJ#:[id number]  PCP: Alva Garnet., MD GYN:   SUClaud Kelp OTHER MD: Terrial Rhodes  CHIEF COMPLAINT: "My mammogram showed I had breast cancer".  HISTORY OF PRESENT ILLNESS: Katha underwent left lumpectomy and sentinel lymph node sampling under her Dr. Leonie Man 05/31/1999 for a 9 mm invasive ductal carcinoma, grade 2, with 0 of 3 sentinel lymph nodes involved. The tumor was 97% estrogen receptor positive, 98% progesterone receptor positive, and had a proliferation marker of 10%. HER-2/neu was negative by the HercepTest. It was diploid. After completing adjuvant radiation treatment the patient took tamoxifen for 5 years, completed in the spring of 2006.  More recently, in 03/19/2013 the patient had bilateral screening mammography at [facility]. There was a focal asymmetry in the left breast at the 1:00 position which by ultrasound on the same day proved to be a 5 mm irregular mass. A 4 mm complex cyst was also noted in the left axillary tail, consistent with a sebaceous cyst.  Biopsy of the left breast mass in question 03/26/2013 showed an invasive ductal carcinoma, with lobular features, grade 1, estrogen receptor 100% positive, progesterone receptor 94% positive, with an MIB-1 of 8% and no HER-2 amplification.  The patient's subsequent history is as detailed below  INTERVAL HISTORY: Jeaneane was evaluated in the breast clinic 04/21/2013 accompanied by her son Susann Givens.  REVIEW OF SYSTEMS: There were no unusual symptoms leading to the October mammogram, which was routinely scheduled. The patient has a history of severe hypertension, with end-stage renal disease, on hemodialysis Mondays, Wednesdays and Fridays. She does not exercise regularly, but occasionally walks "around the block", which is "as far as I can". She has a little bit of a runny nose, mild sinus problems, and a dry cough currently. She has occasional  headaches. These are not more frequent or intense than usual. She has a history of gout and significant osteoarthritis. She has a history of anemia associated with her renal disease otherwise a detailed review of systems today was noncontributory  PAST MEDICAL HISTORY: Past Medical History  Diagnosis Date  . Hypertension   . Arthritis   . Blood transfusion without reported diagnosis   . Renal disorder     patient on dialysis  . Cancer     patient states she had br ca 13 years ago    PAST SURGICAL HISTORY: Past Surgical History  Procedure Laterality Date  . Abdominal hysterectomy    . Breast lumpectomy with axillary lymph node biopsy    . Appendectomy      FAMILY HISTORY Family History  Problem Relation Age of Onset  . Cancer Maternal Aunt     breast   the patient's father died in his 49s from what appears to have been complications of atherosclerotic cardiovascular disease. The patient's mother died in her 27s from complications of diabetes. Cyndy has one adopted brother. She has 2 full sisters. There is no breast or ovarian cancer in the immediate family. One of the patient's mother is 3 sisters was diagnosed with breast cancer but the patient does not know at what age.  GYNECOLOGIC HISTORY:  Menarche age 81, first live birth age 38. She is GX P2. She underwent hysterectomy remotely, but does not know if her ovaries were removed. 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 She is a widow and lives with  her son Shon Hale, who is self-employed in Armed forces logistics/support/administrative officer. Son Susann Givens is completing his PhD in business and health care. The patient has 3 grandchildren she attends a local [employer]    ADVANCED DIRECTIVES: Not in place; the patient was given the appropriate documents on her 04/21/2013 visit to declare a healthcare power of attorney   HEALTH MAINTENANCE: History   Substance Use Topics  . Smoking status: Never Smoker   . Smokeless tobacco: Never Used  . Alcohol Use: No     Colonoscopy:  PAP:  Bone density:  Lipid panel:  Allergies  Allergen Reactions  . Erythromycin [Erythromycin]     Current Outpatient Prescriptions  Medication Sig Dispense Refill  . amLODipine (NORVASC) 10 MG tablet       . cloNIDine (CATAPRES) 0.1 MG tablet       . colchicine 0.6 MG tablet Take 0.6 mg by mouth 2 (two) times daily.      . fexofenadine-pseudoephedrine (ALLEGRA-D 24) 180-240 MG per 24 hr tablet Take 1 tablet by mouth daily.      . fluticasone (FLONASE) 50 MCG/ACT nasal spray Place 2 sprays into the nose daily.      . hydrALAZINE (APRESOLINE) 50 MG tablet       . metoprolol tartrate (LOPRESSOR) 25 MG tablet Take 50 mg by mouth once.       [name] omeprazole (PRILOSEC OTC) 20 MG tablet Take 20 mg by mouth daily.       No current facility-administered medications for this visit.    OBJECTIVE: [name] who appears stated age 7 Vitals:   04/21/13 1633  BP: 174/61  Pulse: 80  Temp: 97.8 F (36.6 C)  Resp: 18     Body mass index is 30.7 kg/(m^2).    ECOG FS:1 - Symptomatic but completely ambulatory  Ocular: Sclerae unicteric, arcus senilis Ear-nose-throat: Oropharynx shows no thrush or other lesions Lymphatic: No cervical or supraclavicular adenopathy Lungs no rales or rhonchi, good excursion bilaterally Heart regular rate and rhythm, 3/6 systolic murmur appreciated Abd soft, nontender, positive bowel sounds, no masses palpated MSK no focal spinal tenderness, fistula and left upper extremity, with good thrill Neuro: non-focal, well-oriented, appropriate affect Breasts: The right breast is unremarkable. The left breast is much smaller than the right. It is slightly darker. I do not palpate any mass in the left breast. There are no skin or nipple changes of concern. The left axilla is benign.   LAB RESULTS:  CMP     Component  Value Date/Time   NA 139 02/09/2012 0951   K 4.7 02/09/2012 0951   CL 97 01/24/2011 1920   CO2 29 01/24/2011 1920   GLUCOSE 85 02/09/2012 0951   BUN 18 01/24/2011 1920   CREATININE 3.90* 01/24/2011 1920   CALCIUM 10.7* 01/24/2011 1920   CALCIUM 7.0* 04/03/2010 1225   PROT 8.5* 01/24/2011 1920   ALBUMIN 4.1 01/24/2011 1920   AST 14 01/24/2011 1920   ALT 11 01/24/2011 1920   ALKPHOS 147* 01/24/2011 1920   BILITOT 0.6 01/24/2011 1920   GFRNONAA 11* 01/24/2011 1920   GFRAA 14* 01/24/2011 1920    I No results found for this basename: SPEP, UPEP,  kappa and lambda light chains    Lab Results  Component Value Date   WBC 3.6* 01/24/2011   NEUTROABS 5.9 04/01/2010   HGB 12.6 02/09/2012   HCT 37.0 02/09/2012   MCV 91.3 01/24/2011   PLT 171 01/24/2011      Chemistry  Component Value Date/Time   NA 139 02/09/2012 0951   K 4.7 02/09/2012 0951   CL 97 01/24/2011 1920   CO2 29 01/24/2011 1920   BUN 18 01/24/2011 1920   CREATININE 3.90* 01/24/2011 1920      Component Value Date/Time   CALCIUM 10.7* 01/24/2011 1920   CALCIUM 7.0* 04/03/2010 1225   ALKPHOS 147* 01/24/2011 1920   AST 14 01/24/2011 1920   ALT 11 01/24/2011 1920   BILITOT 0.6 01/24/2011 1920       No results found for this basename: LABCA2    No components found with this basename: LABCA125    No results found for this basename: INR,  in the last 168 hours  Urinalysis    Component Value Date/Time   COLORURINE YELLOW 03/31/2010 1319   APPEARANCEUR CLEAR 03/31/2010 1319   LABSPEC 1.013 03/31/2010 1319   PHURINE 6.0 03/31/2010 1319   GLUCOSEU 100* 03/31/2010 1319   HGBUR SMALL* 03/31/2010 1319   BILIRUBINUR NEGATIVE 03/31/2010 1319   KETONESUR NEGATIVE 03/31/2010 1319   PROTEINUR >300* 03/31/2010 1319   UROBILINOGEN 0.2 03/31/2010 1319   NITRITE NEGATIVE 03/31/2010 1319   LEUKOCYTESUR NEGATIVE 03/31/2010 1319    STUDIES: Mammography and ultrasonography studies reviewed with the patient and son   ASSESSMENT: 34 y.o. [place] woman  (1)  status post left lumpectomy and sentinel lymph node sampling January of 2001 for a pT1b pN0, stage IA invasive ductal carcinoma, grade 2, estrogen receptor 97% positive, progesterone receptor 90% positive, with an MIB-1 of 10%  (2) status post adjuvant radiation to the left breast  (3) status post tamoxifen given for 5 years, completed February of 2006  (4) status post left breast biopsy 03/26/2013 for a clinical T1a N0, stage IA invasive ductal carcinoma, with lobular features, grade 1, estrogen receptor 100% positive, progesterone receptor 94% positive, with an MIB-1 of 8% and no HER-2 amplification  PLAN: We spent the better part of today's hour-long appointment discussing the biology of breast cancer in general, and the specifics of the patient's tumor in particular. We reviewed the data that show that a mastectomy is equivalent to lumpectomy plus radiation in terms of overall survival. We do not have similar data for lumpectomy without radiation. Moderate understands she would not be able to receive adjuvant radiation and a second time. For that reason mastectomy in this setting is the standard of care.  However she is intent on lumpectomy. I quoted her a roughly 1/3 risk of local recurrence with lumpectomy alone.(It is true that that risk would drop in half if she took anti-estrogens, as will be the plan). She also understands this tumor has lobular features, and it may be difficult to identify the margins. If she has positive margins she would need to have further surgery.  After a very thorough discussion Edeline is very sure she wants to have a lumpectomy. She is not sure whether she wants local versus general anesthesia, but will discuss that with Dr. Derrell Lolling when she sees him December 11. She agrees to have a breast MRI, which we will try to arrange for her before her December 11 visit with Dr. Derrell Lolling, and understands if there are surprises in the MRI then mastectomy may be inevitable after  all.  At this point the overall plan is for lumpectomy with no axillary lymph node sampling (it would not affect the chemotherapy decision) to be followed by anastrozole for 5 years. The patient anticipates having surgery early January. She will see me accordingly  late January. If she has recovered well we will start anastrozole at that time. We will review of her most recent bone density, which was obtained through Dr. Mathews Robinsons office. However note that we will not be able to use bisphosphonates in the setting of hemodialysis.  Marni has a good understanding of this overall plan and agrees to it. She understands the goal of treatment is cure. She knows to call for any problems or questions before her next visit here.   Lowella Dell, MD   04/21/2013 6:25 PM

## 2013-04-21 NOTE — Progress Notes (Signed)
Received chart back from Dawn and placed in Dr. Magrinat's box.  

## 2013-04-22 ENCOUNTER — Telehealth: Payer: Self-pay | Admitting: Oncology

## 2013-04-22 ENCOUNTER — Encounter: Payer: Self-pay | Admitting: *Deleted

## 2013-04-22 ENCOUNTER — Telehealth: Payer: Self-pay | Admitting: *Deleted

## 2013-04-22 NOTE — Telephone Encounter (Signed)
called home ph sw leon adv appt made in Jan and CAL would be mailed shh

## 2013-04-22 NOTE — Progress Notes (Signed)
Mailed after appt letter to pt. 

## 2013-04-22 NOTE — Telephone Encounter (Signed)
sw pt gv appt for MR on 04/28/13 @ 6:30p. Pt is aware...td

## 2013-04-28 ENCOUNTER — Other Ambulatory Visit: Payer: 59

## 2013-04-30 ENCOUNTER — Encounter (INDEPENDENT_AMBULATORY_CARE_PROVIDER_SITE_OTHER): Payer: Self-pay | Admitting: General Surgery

## 2013-04-30 ENCOUNTER — Ambulatory Visit (INDEPENDENT_AMBULATORY_CARE_PROVIDER_SITE_OTHER): Payer: Medicare Other | Admitting: General Surgery

## 2013-04-30 VITALS — BP 132/76 | HR 78 | Temp 98.0°F | Resp 18 | Ht 64.0 in | Wt 164.0 lb

## 2013-04-30 DIAGNOSIS — C50419 Malignant neoplasm of upper-outer quadrant of unspecified female breast: Secondary | ICD-10-CM

## 2013-04-30 DIAGNOSIS — C50412 Malignant neoplasm of upper-outer quadrant of left female breast: Secondary | ICD-10-CM

## 2013-04-30 NOTE — Patient Instructions (Signed)
You have discussed your options with Dr. Darnelle Catalan, and today you have discussed your options for treating your left breast cancer recurrence with me for the second time.  We have talked about the option of left mastectomy as well as lumpectomy. Although these are not necessarily equivalent in terms of long term outcome, you have chosen to have a left lumpectomy and probably will be offered antiestrogen therapy postop.  You will be scheduled for left partial mastectomy with needle localization on a nondialysis day. You will be able to go home the same day.      Lumpectomy A lumpectomy is a form of "breast conserving" or "breast preservation" surgery. It may also be referred to as a partial mastectomy. During a lumpectomy, the portion of the breast that contains the cancerous tumor or breast mass (the lump) is removed. Some normal tissue around the lump may also be removed to make sure all the tumor has been removed. This surgery should take 40 minutes or less. LET [facility] CARE PROVIDER KNOW ABOUT:  Any allergies you have.  All medicines you are taking, including vitamins, herbs, eye drops, creams, and over-the-counter medicines.  Previous problems you or members of your family have had with the use of anesthetics.  Any blood disorders you have.  Previous surgeries you have had.  Medical conditions you have. RISKS AND COMPLICATIONS Generally, this is a safe procedure. However, as with any procedure, complications can occur. Possible complications include:  Bleeding.  Infection.  Pain.  Temporary swelling.  Change in the shape of the breast, particularly if a large portion is removed. BEFORE THE PROCEDURE  Ask your health care provider about changing or stopping your regular medicines.  Do not eat or drink anything for 7 8 hours before the surgery or as directed by your health care provider. Ask your health care provider if you can take a sip of water with any approved  medicines.  On the day of surgery, your healthcare provider will use a mammogram or ultrasound to locate and mark the tumor in your breast. These markings on your breast will show where the cut (incision) will be made. PROCEDURE   An IV tube will be put into one of your veins.  You may be given medicine to help you relax before the surgery (sedative). You will be given one of the following:  A medicine that numbs the area (local anesthesia).  A medicine that makes you go to sleep (general anesthesia).  Your health care provider will use a kind of electric scalpel that uses heat to minimize bleeding (electrocautery knife).  A curved incision (like a smile or frown) that follows the natural curve of your breast is made, to allow for minimal scarring and better healing.  The tumor will be removed with some of the surrounding tissue. This will be sent to the lab for analysis. Your health care provider may also remove your lymph nodes at this time if needed.  Sometimes, but not always, a rubber tube called a drain will be surgically inserted into your breast area or armpit to collect excess fluid that may accumulate in the space where the tumor was. This drain is connected to a plastic bulb on the outside of your body. This drain creates suction to help remove the fluid.  The incisions will be closed with stitches (sutures).  A bandage may be placed over the incisions. AFTER THE PROCEDURE  You will be taken to the recovery area.  You will be  given medicine for pain.  A small rubber drain may be placed in the breast for 2 3 days to prevent a collection of blood (hematoma) from developing in the breast. You will be given instructions on caring for the drain before you go home.  A pressure bandage (dressing) will be applied for 1 2 days to prevent bleeding. Ask your health care provider how to care for your bandage at home. Document Released: 06/18/2006 Document Revised: 01/07/2013 Document  Reviewed: 10/10/2012 [facility] Patient Information 2014 [place], [place].

## 2013-04-30 NOTE — Progress Notes (Addendum)
Patient ID: Pam Fuller, female   DOB: December 17, 1932, 79 y.o.   MRN: [id number]  History: Pam Fuller returns with her son to discuss planning for definitive surgery of her recurrent left breast cancer.. She is aware that the standard of care in her situation is a left total mastectomy, but she has decided to have a left partial mastectomy with needle localization and probable postop antiestrogen therapy. Please see Dr. Darrall Dears detailed note.  Background history is as follows: This very pleasant 79 year old woman has end-stage renal disease on dialysis for 2 years but she is independent, drives her car, and lives with one of her 2 sons. She is followed by Dr. Kellie Shropshire for primary care and Dr. Jomarie LongsValentina Lucks for nephrology. Dialysis on Monday Wednesday Friday. She was diagnosed with cancer of the left breast, upper outer quadrant in 2001. On 05/31/1999 she underwent left partial mastectomy and left axillary sentinel node biopsy by Dr. Dominga Ferry. We have those records demonstrating a 0.9 cm tumor, receptor positive, HER-2/neu negative, node negative. Pathologic stage TI B., N0. She received adjuvant chemotherapy as well as adjuvant radiation therapy. She said she got a bad burn in the lower outer quadrant of the left breast from the radiation therapy and had lots of desquamation. She says there is a hyperpigmented area there.  She went for screening mammograms at [facility]. She also had ultrasound. They found a 5 mm mass in the left breast at 3:00 position and a small cyst in the left axilla tail. Image guided biopsy of the 5 mm mass at 3:00 position shows invasive mammary carcinoma, lobular features, receptor positive, Ki-67 8%, HER-2/neu negative.  She is in no distress today. One of her sons is with her throughout the encounter  Comorbidities include end-stage renal disease, history of breast cancer, hypertension, gout, GERD.[name]  She was scheduled for MRI by  Dr. Darnelle Catalan. That study was canceled  by the radiologist stated they could not get a dialysis patient the contrast. We have discussed this, specifically outlining the sensitivity and specificity issues with MRI, and decided not to press the issue.   Past history, social history, family history, and review of systems are documented on the chart, unchanged, and noncontributory except as described above.   Exam: Constitutional: She is oriented to person, place, and time. She appears well-developed and well-nourished. No distress.  HENT:  Head: Normocephalic and atraumatic.  Nose: Nose normal.  Mouth/Throat: No oropharyngeal exudate.  Eyes: Conjunctivae and EOM are normal. Pupils are equal, round, and reactive to light. Left eye exhibits no discharge. No scleral icterus.  Neck: Neck supple. No JVD present. No tracheal deviation present. No thyromegaly present.  Cardiovascular: Normal rate, regular rhythm, normal heart sounds and intact distal pulses.  No murmur heard.  Pulmonary/Chest: Effort normal and breath sounds normal. No respiratory distress. She has no wheezes. She has no rales. She exhibits no tenderness.    Left breast smaller than right. Hyperpigmentation and burn scar lower outer quadrant left breast. Well-healed scar left breast upper outer quadrant and left axilla. There is no real palpable mass in either breast. No skin changes on the right. No axillary adenopathy on either side.  Abdominal: Soft. Bowel sounds are normal. She exhibits no distension and no mass. There is no tenderness. There is no rebound and no guarding.  Musculoskeletal: She exhibits no edema and no tenderness.  Lymphadenopathy:  She has no cervical adenopathy.  Neurological: She is alert and oriented to person, place, and time. She  exhibits normal muscle tone. Coordination normal.  Skin: Skin is warm. No rash noted. She is not diaphoretic. No erythema. No pallor.  Psychiatric: She has a normal mood and affect. Her behavior is normal. Judgment and  thought content normal   Assessment  New or recurrent Invasive mammary carcinoma left breast, 3:00 position, 5 mm tumor, receptor positive, HER-2/neu negative.  13 years postop left partial mastectomy, sentinel node biopsy and adjuvant chemotherapy and radiation therapy. Her tumor was T1B, N0 at that point.   End-stage renal disease on dialysis for 2 years. AV fistula left upper extremity  Despite her comorbidities, she is quite functional and independent.  Hypertension  Gout  GERD   Plan: She will be scheduled for left partial mastectomy with needle localization as an outpatient in the near future. We will coordinate with her nephrologist, but this will need to be done on a nondialysis day. I do not plan any surgical management of the left axilla at this time due to the low probability of lymph node metastasis and the fact that her left arm is her dialysis access.  She is aware that the standard of care for her situation is left total mastectomy. She accepts the theoretical increased local recurrence rate with a lumpectomy and antiestrogen alone.  I discussed the indications, details, techniques, and numerous risk of the surgery with her and her son. She is aware of the risk of bleeding, infection, cosmetic deformity, reoperation for positive margins, skin healing problems and so forth. At this time all of her questions are answered and she agrees with this plan.      Angelia Mould. Derrell Lolling, M.D., [facility] General and Minimally invasive Surgery Breast and Colorectal Surgery Office:   [phone] Pager:   [phone]

## 2013-05-14 ENCOUNTER — Emergency Department (HOSPITAL_BASED_OUTPATIENT_CLINIC_OR_DEPARTMENT_OTHER)
Admission: EM | Admit: 2013-05-14 | Discharge: 2013-05-15 | Disposition: A | Discharge status: Home / Self Care | Payer: Medicare Other | Attending: Emergency Medicine | Admitting: Emergency Medicine

## 2013-05-14 DIAGNOSIS — Z8739 Personal history of other diseases of the musculoskeletal system and connective tissue: Secondary | ICD-10-CM | POA: Insufficient documentation

## 2013-05-14 DIAGNOSIS — Z87448 Personal history of other diseases of urinary system: Secondary | ICD-10-CM | POA: Insufficient documentation

## 2013-05-14 DIAGNOSIS — Z853 Personal history of malignant neoplasm of breast: Secondary | ICD-10-CM | POA: Insufficient documentation

## 2013-05-14 DIAGNOSIS — R1110 Vomiting, unspecified: Secondary | ICD-10-CM | POA: Insufficient documentation

## 2013-05-14 DIAGNOSIS — Z79899 Other long term (current) drug therapy: Secondary | ICD-10-CM | POA: Insufficient documentation

## 2013-05-14 DIAGNOSIS — I10 Essential (primary) hypertension: Secondary | ICD-10-CM | POA: Insufficient documentation

## 2013-05-14 DIAGNOSIS — IMO0002 Reserved for concepts with insufficient information to code with codable children: Secondary | ICD-10-CM | POA: Insufficient documentation

## 2013-05-14 DIAGNOSIS — R5381 Other malaise: Secondary | ICD-10-CM | POA: Insufficient documentation

## 2013-05-14 DIAGNOSIS — K5900 Constipation, unspecified: Secondary | ICD-10-CM | POA: Insufficient documentation

## 2013-05-14 HISTORY — DX: End stage renal disease: Z99.2

## 2013-05-14 HISTORY — DX: End stage renal disease: N18.6

## 2013-05-14 MED ORDER — ONDANSETRON 4 MG PO TBDP
4.0000 mg | ORAL_TABLET | Freq: Once | ORAL | Status: AC
  Administered 2013-05-15: 4 mg via ORAL
  Filled 2013-05-14: qty 1

## 2013-05-14 NOTE — ED Notes (Addendum)
Vomiting, headache, decreased PO intake, warm to touch per family, all starting 2 days ago. Denies fever or chills. Denies diarrhea. Pt sts that she had a root canal 3 days ago and has some swelling of left cheek from root canal. Pt unsure if vomiting related to root canal or just has virus. Pt has dialysis tomorrow.

## 2013-05-14 NOTE — ED Provider Notes (Signed)
CSN: [id number]     Arrival date & time 05/14/13  2311 History   First MD Initiated Contact with Patient 05/14/13 2330     Chief Complaint  Patient presents with  . Emesis   (Consider location/radiation/quality/duration/timing/severity/associated sxs/prior Treatment) HPI Patient with history of renal failure on hemodialysis last dialyzed on Tuesday presents with several days of vomiting. She had a root canal done to the left inferior second molar on Monday has had vomiting x4 daily since. She denies any bilious or bloody vomit. She's had no fevers or chills. She currently has no nausea. She denies any increased pain in the dental area. She has no abdominal pain. She denies any chest pain or shortness of breath. She makes some urine and has had no dysuria, frequency or hematuria. She has mild fatigue but no focal weakness or numbness. No known sick contacts. She has been on a penicillin derivative antibiotics since the root canal. She is unable to recall the name of the antibiotic. Patient has not had a bowel movement for several days. She denies any diarrhea. Past Medical History  Diagnosis Date  . Hypertension   . Arthritis   . Blood transfusion without reported diagnosis   . Renal disorder     patient on dialysis  . Cancer     patient states she had br ca 13 years ago   Past Surgical History  Procedure Laterality Date  . Abdominal hysterectomy    . Breast lumpectomy with axillary lymph node biopsy    . Appendectomy     Family History  Problem Relation Age of Onset  . Cancer Maternal Aunt     breast   History  Substance Use Topics  . Smoking status: Never Smoker   . Smokeless tobacco: Never Used  . Alcohol Use: No   OB History   Grav Para Term Preterm Abortions TAB SAB Ect Mult Living                 Review of Systems  Constitutional: Positive for fatigue. Negative for fever and chills.  HENT: Negative for sore throat.   Respiratory: Negative for cough and shortness of  breath.   Cardiovascular: Negative for chest pain and palpitations.  Gastrointestinal: Positive for nausea, vomiting and constipation. Negative for abdominal pain and diarrhea.  Genitourinary: Negative for dysuria, frequency, hematuria and flank pain.  Musculoskeletal: Negative for back pain, neck pain and neck stiffness.  Skin: Negative for rash and wound.  Neurological: Negative for dizziness, weakness, light-headedness, numbness and headaches.  All other systems reviewed and are negative.    Allergies  Erythromycin  Home Medications   Current Outpatient Rx  Name  Route  Sig  Dispense  Refill  . amLODipine (NORVASC) 10 MG tablet               . cloNIDine (CATAPRES) 0.1 MG tablet               . colchicine 0.6 MG tablet   Oral   Take 0.6 mg by mouth 2 (two) times daily.         . fexofenadine-pseudoephedrine (ALLEGRA-D 24) 180-240 MG per 24 hr tablet   Oral   Take 1 tablet by mouth daily.         . fluticasone (FLONASE) 50 MCG/ACT nasal spray   Nasal   Place 2 sprays into the nose daily.         . hydrALAZINE (APRESOLINE) 50 MG tablet               .  metoprolol tartrate (LOPRESSOR) 25 MG tablet   Oral   Take 50 mg by mouth once.          [name] omeprazole (PRILOSEC OTC) 20 MG tablet   Oral   Take 20 mg by mouth daily.          BP 186/80  Pulse 87  Temp(Src) 98 F (36.7 C) (Oral)  Ht 5\' 2"  (1.575 m)  Wt 168 lb (76.204 kg)  BMI 30.72 kg/m2  SpO2 99% Physical Exam  Nursing note and vitals reviewed. Constitutional: She is oriented to person, place, and time. She appears well-developed and well-nourished. No distress.  HENT:  Head: Normocephalic and atraumatic.  Mouth/Throat: Oropharynx is clear and moist.  Eyes: EOM are normal. Pupils are equal, round, and reactive to light.  Neck: Normal range of motion. Neck supple.  Cardiovascular: Normal rate and regular rhythm.   Pulmonary/Chest: Effort normal and breath sounds normal. No respiratory  distress. She has no wheezes. She has no rales. She exhibits no tenderness.  Abdominal: Soft. Bowel sounds are normal. She exhibits no distension and no mass. There is no tenderness. There is no rebound and no guarding.  Musculoskeletal: Normal range of motion. She exhibits no edema and no tenderness.  Palpable thrill in the left forearm at the site of the AV fistula. No warmth, tenderness or swelling.  Neurological: She is alert and oriented to person, place, and time.  Patient is alert and oriented x3 with clear, goal oriented speech. Patient has 5/5 motor in all extremities. Sensation is intact to light touch.   Skin: Skin is warm and dry. No rash noted. No erythema.  Psychiatric: She has a normal mood and affect. Her behavior is normal.    ED Course  Procedures (including critical care time) Labs Review Labs Reviewed  CBC WITH DIFFERENTIAL  COMPREHENSIVE METABOLIC PANEL  URINALYSIS, ROUTINE W REFLEX MICROSCOPIC  TROPONIN I  LIPASE, BLOOD   Imaging Review No results found.  EKG Interpretation   None       MDM    Patient with no vomiting and remains asymptomatic in the emergency department. She is tolerating fluids. She refused further blood draws therefore was unable to get a CBC. She was unable to make any urine and states she makes very small amount. EKG with right bundle branch block with no prior comparison EKG recently. Had  discussion with patient and her son. All questions were answered. She is following up tomorrow at her dialysis clinic. She's been given strict return precautions to return immediately for worsening vomiting, any pain or for any concerns. Patient's voice understanding.   Loren Racer, MD 05/15/13 0130

## 2013-05-15 ENCOUNTER — Encounter (HOSPITAL_BASED_OUTPATIENT_CLINIC_OR_DEPARTMENT_OTHER): Payer: Self-pay | Admitting: Emergency Medicine

## 2013-05-15 LAB — COMPREHENSIVE METABOLIC PANEL
ALT: 13 U/L (ref 0–35)
AST: 24 U/L (ref 0–37)
Alkaline Phosphatase: 123 U/L — ABNORMAL HIGH (ref 39–117)
CO2: 31 mEq/L (ref 19–32)
Calcium: 10.7 mg/dL — ABNORMAL HIGH (ref 8.4–10.5)
GFR calc Af Amer: 5 mL/min — ABNORMAL LOW (ref >90)
GFR calc non Af Amer: 5 mL/min — ABNORMAL LOW (ref >90)
Potassium: 4 mEq/L (ref 3.5–5.1)
Sodium: 138 mEq/L (ref 135–145)
Total Bilirubin: 0.6 mg/dL (ref 0.3–1.2)
Total Protein: 8.9 g/dL — ABNORMAL HIGH (ref 6.0–8.3)

## 2013-05-15 LAB — LIPASE, BLOOD: Lipase: 20 U/L (ref 11–59)

## 2013-05-15 MED ORDER — ONDANSETRON 4 MG PO TBDP: ORAL_TABLET | ORAL | Status: DC

## 2013-05-15 NOTE — ED Notes (Signed)
Pt is a difficult phlebotomy/IV access candidate - unable to obtain CBC at this time, attempted x2 to obtain with one specimen hemolyzing and vein collapsing with second specimen - patient has had venipuncture x2 - states that she does not want to have additional venipuncture, states "please just use the blood you have." (CMP, Trop, Lipase) - Pt also states that due to her HD she cannot void for UA at this time - states she is oliguric due to HD - EDP made aware. NNO.

## 2013-06-09 ENCOUNTER — Telehealth: Payer: Self-pay | Admitting: *Deleted

## 2013-06-09 NOTE — Telephone Encounter (Signed)
Pt left message stating she needs to reschedule her appointment with Dr Jannifer Rodney on 06/18/2013 due to date of surgery under Dr Dalbert Batman.  POF sent to scheduling per above request.

## 2013-06-11 ENCOUNTER — Other Ambulatory Visit: Payer: Self-pay | Admitting: Oncology

## 2013-06-14 NOTE — H&P (Signed)
Pam Fuller   MRN:  [id number]   Description: 80 year old female  Provider: Adin Hector, MD  Department: [facility]          Diagnoses      Breast cancer of upper-outer quadrant of left female breast    -  Primary      174.4                    Current Vitals - Last Recorded      BP Pulse Temp(Src) Resp Ht Wt      132/76 78 98 F (36.7 C) 18 5' 4" (1.626 m) 164 lb (74.39 kg)     BMI - 28.14 kg/m2                      Vitals History Recorded              History and Physical   Adin Hector, MD    Status: Addendum            Patient ID: Pam Fuller, female   DOB: July 13, 1932, 80 y.o.   MRN: [id number]           History: [name] returns with her son to discuss planning for definitive surgery of her recurrent left breast cancer.. She is aware that the standard of care in her situation is a left total mastectomy, but she has decided to have a left partial mastectomy with needle localization and probable postop antiestrogen therapy. Please see Dr. Virgie Dad detailed note.   Background history is as follows: This very pleasant 80 year old woman has end-stage renal disease on dialysis for 2 years but she is independent, drives her car, and lives with one of her 2 sons. She is followed by Dr. Heath Gold for primary care and Dr. Broadus JohnLittie Deeds for nephrology. Dialysis on Monday Wednesday Friday. She was diagnosed with cancer of the left breast, upper outer quadrant in 2001. On 05/31/1999 she underwent left partial mastectomy and left axillary sentinel node biopsy by Dr. Ronnie Derby. We have those records demonstrating a 0.9 cm tumor, receptor positive, HER-2/neu negative, node negative. Pathologic stage TI B., N0. She received adjuvant chemotherapy as well as adjuvant radiation therapy. She said she got a bad burn in the lower outer quadrant of the left breast from the radiation therapy and had lots of desquamation. She says there is a  hyperpigmented area there.   She went for screening mammograms at [facility]. She also had ultrasound. They found a 5 mm mass in the left breast at 3:00 position and a small cyst in the left axilla tail. Image guided biopsy of the 5 mm mass at 3:00 position shows invasive mammary carcinoma, lobular features, receptor positive, Ki-67 8%, HER-2/neu negative.   She is in no distress today. One of her sons is with her throughout the encounter   Comorbidities include end-stage renal disease, history of breast cancer, hypertension, gout, GERD.[name]   She was scheduled for MRI by  Dr. Jana Hakim. That study was canceled by the radiologist stated they could not get a dialysis patient the contrast. We have discussed this, specifically outlining the sensitivity and specificity issues with MRI, and decided not to press the issue.     Past history, social history, family history, and review of systems are documented on the chart, unchanged, and noncontributory except as described above.     Exam: Constitutional: She is oriented to person,  place, and time. She appears well-developed and well-nourished. No distress.   HENT:   Head: Normocephalic and atraumatic.   Nose: Nose normal.   Mouth/Throat: No oropharyngeal exudate.   Eyes: Conjunctivae and EOM are normal. Pupils are equal, round, and reactive to light. Left eye exhibits no discharge. No scleral icterus.   Neck: Neck supple. No JVD present. No tracheal deviation present. No thyromegaly present.   Cardiovascular: Normal rate, regular rhythm, normal heart sounds and intact distal pulses.   No murmur heard.   Pulmonary/Chest: Effort normal and breath sounds normal. No respiratory distress. She has no wheezes. She has no rales. She exhibits no tenderness.    Left breast smaller than right. Hyperpigmentation and burn scar lower outer quadrant left breast. Well-healed scar left breast upper outer quadrant and left axilla. There is no real palpable mass in  either breast. No skin changes on the right. No axillary adenopathy on either side.  Abdominal: Soft. Bowel sounds are normal. She exhibits no distension and no mass. There is no tenderness. There is no rebound and no guarding.  Musculoskeletal: She exhibits no edema and no tenderness.  Lymphadenopathy:  She has no cervical adenopathy.  Neurological: She is alert and oriented to person, place, and time. She exhibits normal muscle tone. Coordination normal.   Skin: Skin is warm. No rash noted. She is not diaphoretic. No erythema. No pallor.  Psychiatric: She has a normal mood and affect. Her behavior is normal. Judgment and thought content normal     Assessment   New or recurrent Invasive mammary carcinoma left breast, 3:00 position, 5 mm tumor, receptor positive, HER-2/neu negative.   13 years postop left partial mastectomy, sentinel node biopsy and adjuvant chemotherapy and radiation therapy. Her tumor was T1B, N0 at that point.    End-stage renal disease on dialysis for 2 years. AV fistula left upper extremity   Despite her comorbidities, she is quite functional and independent.   Hypertension   Gout   GERD     Plan: She will be scheduled for left partial mastectomy with needle localization as an outpatient in the near future. We will coordinate with her nephrologist, but this will need to be done on a nondialysis day. I do not plan any surgical management of the left axilla at this time due to the low probability of lymph node metastasis and the fact that her left arm is her dialysis access.   She is aware that the standard of care for her situation is left total mastectomy. She accepts the theoretical increased local recurrence rate with a lumpectomy and antiestrogen alone.   I discussed the indications, details, techniques, and numerous risk of the surgery with her and her son. She is aware of the risk of bleeding, infection, cosmetic deformity, reoperation for positive  margins, skin healing problems and so forth. At this time all of her questions are answered and she agrees with this plan.        Edsel Petrin. Dalbert Batman, M.D., [facility] General and Minimally invasive Surgery Breast and Colorectal Surgery Office:   [phone] Pager:   [phone]

## 2013-06-15 ENCOUNTER — Encounter (HOSPITAL_BASED_OUTPATIENT_CLINIC_OR_DEPARTMENT_OTHER): Payer: Self-pay | Admitting: *Deleted

## 2013-06-15 NOTE — Progress Notes (Signed)
Dialysis pt-will need istat

## 2013-06-16 ENCOUNTER — Encounter (HOSPITAL_BASED_OUTPATIENT_CLINIC_OR_DEPARTMENT_OTHER)
Admission: RE | Admit: 2013-06-16 | Discharge: 2013-06-16 | Disposition: A | Discharge status: Home / Self Care | Payer: Medicare Other | Source: Ambulatory Visit | Attending: General Surgery | Admitting: General Surgery

## 2013-06-17 ENCOUNTER — Encounter (HOSPITAL_BASED_OUTPATIENT_CLINIC_OR_DEPARTMENT_OTHER)
Admission: RE | Admit: 2013-06-17 | Discharge: 2013-06-17 | Disposition: A | Discharge status: Home / Self Care | Payer: Medicare Other | Source: Ambulatory Visit | Attending: General Surgery | Admitting: General Surgery

## 2013-06-17 LAB — CBC WITH DIFFERENTIAL/PLATELET
BASOS ABS: 0 10*3/uL (ref 0.0–0.1)
BASOS PCT: 0 % (ref 0–1)
EOS ABS: 0.4 10*3/uL (ref 0.0–0.7)
Eosinophils Relative: 5 % (ref 0–5)
HCT: 33.7 % — ABNORMAL LOW (ref 36.0–46.0)
Hemoglobin: 11.1 g/dL — ABNORMAL LOW (ref 12.0–15.0)
Lymphocytes Relative: 22 % (ref 12–46)
Lymphs Abs: 1.5 10*3/uL (ref 0.7–4.0)
MCH: 29.9 pg (ref 26.0–34.0)
MCHC: 32.9 g/dL (ref 30.0–36.0)
MCV: 90.8 fL (ref 78.0–100.0)
Monocytes Absolute: 0.3 10*3/uL (ref 0.1–1.0)
Monocytes Relative: 4 % (ref 3–12)
NEUTROS ABS: 4.7 10*3/uL (ref 1.7–7.7)
Neutrophils Relative %: 68 % (ref 43–77)
PLATELETS: 271 10*3/uL (ref 150–400)
RBC: 3.71 MIL/uL — ABNORMAL LOW (ref 3.87–5.11)
RDW: 15.6 % — AB (ref 11.5–15.5)
WBC: 6.9 10*3/uL (ref 4.0–10.5)

## 2013-06-17 LAB — COMPREHENSIVE METABOLIC PANEL
ALBUMIN: 3.6 g/dL (ref 3.5–5.2)
ALK PHOS: 117 U/L (ref 39–117)
ALT: 18 U/L (ref 0–35)
AST: 19 U/L (ref 0–37)
BUN: 33 mg/dL — AB (ref 6–23)
CHLORIDE: 97 meq/L (ref 96–112)
CO2: 23 mEq/L (ref 19–32)
Calcium: 8.3 mg/dL — ABNORMAL LOW (ref 8.4–10.5)
Creatinine, Ser: 6.78 mg/dL — ABNORMAL HIGH (ref 0.50–1.10)
GFR calc Af Amer: 6 mL/min — ABNORMAL LOW (ref >90)
GFR calc non Af Amer: 5 mL/min — ABNORMAL LOW (ref >90)
Glucose, Bld: 111 mg/dL — ABNORMAL HIGH (ref 70–99)
POTASSIUM: 4.3 meq/L (ref 3.7–5.3)
SODIUM: 139 meq/L (ref 137–147)
TOTAL PROTEIN: 7.7 g/dL (ref 6.0–8.3)
Total Bilirubin: 0.5 mg/dL (ref 0.3–1.2)

## 2013-06-18 ENCOUNTER — Telehealth (INDEPENDENT_AMBULATORY_CARE_PROVIDER_SITE_OTHER): Payer: Self-pay | Admitting: General Surgery

## 2013-06-18 ENCOUNTER — Ambulatory Visit: Payer: 59 | Admitting: Oncology

## 2013-06-18 ENCOUNTER — Ambulatory Visit (HOSPITAL_BASED_OUTPATIENT_CLINIC_OR_DEPARTMENT_OTHER)
Admission: RE | Admit: 2013-06-18 | Discharge: 2013-06-18 | Disposition: A | Discharge status: Home / Self Care | Payer: Medicare Other | Source: Ambulatory Visit | Attending: General Surgery | Admitting: General Surgery

## 2013-06-18 ENCOUNTER — Telehealth: Payer: Self-pay | Admitting: *Deleted

## 2013-06-18 ENCOUNTER — Encounter (HOSPITAL_BASED_OUTPATIENT_CLINIC_OR_DEPARTMENT_OTHER)
Admission: RE | Disposition: A | Discharge status: Home / Self Care | Payer: Self-pay | Source: Ambulatory Visit | Attending: General Surgery

## 2013-06-18 ENCOUNTER — Ambulatory Visit (HOSPITAL_BASED_OUTPATIENT_CLINIC_OR_DEPARTMENT_OTHER): Payer: Medicare Other | Admitting: Anesthesiology

## 2013-06-18 ENCOUNTER — Encounter (HOSPITAL_BASED_OUTPATIENT_CLINIC_OR_DEPARTMENT_OTHER): Payer: Medicare Other | Admitting: Anesthesiology

## 2013-06-18 ENCOUNTER — Encounter (HOSPITAL_BASED_OUTPATIENT_CLINIC_OR_DEPARTMENT_OTHER): Payer: Self-pay | Admitting: Anesthesiology

## 2013-06-18 DIAGNOSIS — C50919 Malignant neoplasm of unspecified site of unspecified female breast: Secondary | ICD-10-CM | POA: Insufficient documentation

## 2013-06-18 DIAGNOSIS — K219 Gastro-esophageal reflux disease without esophagitis: Secondary | ICD-10-CM | POA: Insufficient documentation

## 2013-06-18 DIAGNOSIS — M109 Gout, unspecified: Secondary | ICD-10-CM | POA: Insufficient documentation

## 2013-06-18 DIAGNOSIS — Z01812 Encounter for preprocedural laboratory examination: Secondary | ICD-10-CM | POA: Insufficient documentation

## 2013-06-18 DIAGNOSIS — Z170 Estrogen receptor positive status [ER+]: Secondary | ICD-10-CM

## 2013-06-18 DIAGNOSIS — C50412 Malignant neoplasm of upper-outer quadrant of left female breast: Secondary | ICD-10-CM | POA: Diagnosis present

## 2013-06-18 DIAGNOSIS — N186 End stage renal disease: Secondary | ICD-10-CM | POA: Insufficient documentation

## 2013-06-18 DIAGNOSIS — Z01818 Encounter for other preprocedural examination: Secondary | ICD-10-CM | POA: Insufficient documentation

## 2013-06-18 DIAGNOSIS — Z992 Dependence on renal dialysis: Secondary | ICD-10-CM | POA: Insufficient documentation

## 2013-06-18 DIAGNOSIS — I120 Hypertensive chronic kidney disease with stage 5 chronic kidney disease or end stage renal disease: Secondary | ICD-10-CM | POA: Insufficient documentation

## 2013-06-18 HISTORY — DX: Presence of spectacles and contact lenses: Z97.3

## 2013-06-18 HISTORY — PX: PARTIAL MASTECTOMY WITH NEEDLE LOCALIZATION: SHX6008

## 2013-06-18 SURGERY — PARTIAL MASTECTOMY WITH NEEDLE LOCALIZATION
Anesthesia: General | Site: Breast | Laterality: Left

## 2013-06-18 MED ORDER — FENTANYL CITRATE 0.05 MG/ML IJ SOLN: 50.0000 ug | INTRAMUSCULAR | Status: DC | PRN

## 2013-06-18 MED ORDER — OXYCODONE HCL 5 MG/5ML PO SOLN: 5.0000 mg | Freq: Once | ORAL | Status: DC | PRN

## 2013-06-18 MED ORDER — LIDOCAINE HCL (CARDIAC) 20 MG/ML IV SOLN
INTRAVENOUS | Status: DC | PRN
  Administered 2013-06-18: 80 mg via INTRAVENOUS

## 2013-06-18 MED ORDER — ACETAMINOPHEN-CODEINE #3 300-30 MG PO TABS
1.0000 | ORAL_TABLET | Freq: Once | ORAL | Status: AC | PRN
  Administered 2013-06-18: 1 via ORAL

## 2013-06-18 MED ORDER — MIDAZOLAM HCL 2 MG/2ML IJ SOLN: 1.0000 mg | INTRAMUSCULAR | Status: DC | PRN

## 2013-06-18 MED ORDER — ACETAMINOPHEN-CODEINE #3 300-30 MG PO TABS
ORAL_TABLET | ORAL | Status: AC
  Filled 2013-06-18: qty 1

## 2013-06-18 MED ORDER — HYDROMORPHONE HCL PF 1 MG/ML IJ SOLN
0.2500 mg | INTRAMUSCULAR | Status: DC | PRN
  Administered 2013-06-18: 0.25 mg via INTRAVENOUS

## 2013-06-18 MED ORDER — DEXAMETHASONE SODIUM PHOSPHATE 4 MG/ML IJ SOLN
INTRAMUSCULAR | Status: DC | PRN
  Administered 2013-06-18: 10 mg via INTRAVENOUS

## 2013-06-18 MED ORDER — LIDOCAINE HCL (PF) 1 % IJ SOLN
INTRAMUSCULAR | Status: AC
  Filled 2013-06-18: qty 30

## 2013-06-18 MED ORDER — FENTANYL CITRATE 0.05 MG/ML IJ SOLN
INTRAMUSCULAR | Status: AC
  Filled 2013-06-18: qty 2

## 2013-06-18 MED ORDER — OXYCODONE HCL 5 MG PO TABS: 5.0000 mg | ORAL_TABLET | Freq: Once | ORAL | Status: DC | PRN

## 2013-06-18 MED ORDER — HYDROCODONE-ACETAMINOPHEN 5-325 MG PO TABS: 1.0000 | ORAL_TABLET | ORAL | Status: DC | PRN

## 2013-06-18 MED ORDER — BUPIVACAINE-EPINEPHRINE PF 0.25-1:200000 % IJ SOLN
INTRAMUSCULAR | Status: DC | PRN
  Administered 2013-06-18: 5 mL

## 2013-06-18 MED ORDER — SODIUM BICARBONATE 4 % IV SOLN
INTRAVENOUS | Status: AC
  Filled 2013-06-18: qty 5

## 2013-06-18 MED ORDER — LACTATED RINGERS IV SOLN: INTRAVENOUS | Status: DC

## 2013-06-18 MED ORDER — CHLORHEXIDINE GLUCONATE 4 % EX LIQD: 1.0000 "application " | Freq: Once | CUTANEOUS | Status: DC

## 2013-06-18 MED ORDER — SODIUM CHLORIDE 0.9 % IV SOLN
INTRAVENOUS | Status: DC
  Administered 2013-06-18: 13:00:00 via INTRAVENOUS

## 2013-06-18 MED ORDER — HYDROMORPHONE HCL PF 1 MG/ML IJ SOLN
INTRAMUSCULAR | Status: AC
  Filled 2013-06-18: qty 1

## 2013-06-18 MED ORDER — FENTANYL CITRATE 0.05 MG/ML IJ SOLN
INTRAMUSCULAR | Status: DC | PRN
  Administered 2013-06-18 (×2): 50 ug via INTRAVENOUS

## 2013-06-18 MED ORDER — ONDANSETRON HCL 4 MG/2ML IJ SOLN
INTRAMUSCULAR | Status: DC | PRN
  Administered 2013-06-18: 4 mg via INTRAVENOUS

## 2013-06-18 MED ORDER — PROPOFOL 10 MG/ML IV BOLUS
INTRAVENOUS | Status: DC | PRN
  Administered 2013-06-18: 200 mg via INTRAVENOUS

## 2013-06-18 MED ORDER — CEFAZOLIN SODIUM-DEXTROSE 2-3 GM-% IV SOLR
2.0000 g | INTRAVENOUS | Status: AC
  Administered 2013-06-18: 2 g via INTRAVENOUS

## 2013-06-18 MED ORDER — ONDANSETRON HCL 4 MG/2ML IJ SOLN: 4.0000 mg | Freq: Once | INTRAMUSCULAR | Status: DC | PRN

## 2013-06-18 MED ORDER — EPHEDRINE SULFATE 50 MG/ML IJ SOLN
INTRAMUSCULAR | Status: DC | PRN
  Administered 2013-06-18: 10 mg via INTRAVENOUS

## 2013-06-18 MED ORDER — BUPIVACAINE-EPINEPHRINE PF 0.5-1:200000 % IJ SOLN
INTRAMUSCULAR | Status: AC
  Filled 2013-06-18: qty 30

## 2013-06-18 SURGICAL SUPPLY — 61 items
APL SKNCLS STERI-STRIP NONHPOA (GAUZE/BANDAGES/DRESSINGS)
APPLIER CLIP 9.375 MED OPEN (MISCELLANEOUS) ×3
BANDAGE ELASTIC 6 VELCRO ST LF (GAUZE/BANDAGES/DRESSINGS) IMPLANT
BENZOIN TINCTURE PRP APPL 2/3 (GAUZE/BANDAGES/DRESSINGS) IMPLANT
BINDER BREAST LRG (GAUZE/BANDAGES/DRESSINGS) ×3 IMPLANT
BINDER BREAST MEDIUM (GAUZE/BANDAGES/DRESSINGS) IMPLANT
BINDER BREAST XLRG (GAUZE/BANDAGES/DRESSINGS) IMPLANT
BINDER BREAST XXLRG (GAUZE/BANDAGES/DRESSINGS) IMPLANT
BLADE HEX COATED 2.75 (ELECTRODE) ×3 IMPLANT
BLADE SURG 15 STRL LF DISP TIS (BLADE) ×2 IMPLANT
BLADE SURG 15 STRL SS (BLADE) ×4
CANISTER SUCT 1200ML W/VALVE (MISCELLANEOUS) ×3 IMPLANT
CHLORAPREP W/TINT 26ML (MISCELLANEOUS) ×3 IMPLANT
CLIP APPLIE 9.375 MED OPEN (MISCELLANEOUS) ×1 IMPLANT
CLOSURE WOUND 1/2 X4 (GAUZE/BANDAGES/DRESSINGS)
COVER MAYO STAND STRL (DRAPES) ×3 IMPLANT
COVER TABLE BACK 60X90 (DRAPES) ×3 IMPLANT
DECANTER SPIKE VIAL GLASS SM (MISCELLANEOUS) IMPLANT
DERMABOND ADVANCED (GAUZE/BANDAGES/DRESSINGS) ×2
DERMABOND ADVANCED .7 DNX12 (GAUZE/BANDAGES/DRESSINGS) ×1 IMPLANT
DEVICE DUBIN W/COMP PLATE 8390 (MISCELLANEOUS) ×3 IMPLANT
DRAPE LAPAROSCOPIC ABDOMINAL (DRAPES) IMPLANT
DRAPE LAPAROTOMY TRNSV 102X78 (DRAPE) IMPLANT
DRAPE PED LAPAROTOMY (DRAPES) ×3 IMPLANT
DRAPE UTILITY XL STRL (DRAPES) ×3 IMPLANT
ELECT REM PT RETURN 9FT ADLT (ELECTROSURGICAL) ×3
ELECTRODE REM PT RTRN 9FT ADLT (ELECTROSURGICAL) ×1 IMPLANT
GAUZE SPONGE 4X4 16PLY XRAY LF (GAUZE/BANDAGES/DRESSINGS) IMPLANT
GLOVE BIO SURGEON STRL SZ 6.5 (GLOVE) ×2 IMPLANT
GLOVE BIO SURGEONS STRL SZ 6.5 (GLOVE) ×1
GLOVE EUDERMIC 7 POWDERFREE (GLOVE) ×3 IMPLANT
GOWN STRL REUS W/ TWL LRG LVL3 (GOWN DISPOSABLE) ×1 IMPLANT
GOWN STRL REUS W/ TWL XL LVL3 (GOWN DISPOSABLE) ×1 IMPLANT
GOWN STRL REUS W/TWL LRG LVL3 (GOWN DISPOSABLE) ×3
GOWN STRL REUS W/TWL XL LVL3 (GOWN DISPOSABLE) ×2
KIT MARKER MARGIN INK (KITS) ×3 IMPLANT
NEEDLE HYPO 22GX1.5 SAFETY (NEEDLE) IMPLANT
NEEDLE HYPO 25X1 1.5 SAFETY (NEEDLE) ×3 IMPLANT
NS IRRIG 1000ML POUR BTL (IV SOLUTION) ×3 IMPLANT
PACK BASIN DAY SURGERY FS (CUSTOM PROCEDURE TRAY) ×3 IMPLANT
PAD ABD 8X10 STRL (GAUZE/BANDAGES/DRESSINGS) ×3 IMPLANT
PENCIL BUTTON HOLSTER BLD 10FT (ELECTRODE) ×3 IMPLANT
SLEEVE SCD COMPRESS KNEE MED (MISCELLANEOUS) ×3 IMPLANT
SPONGE GAUZE 4X4 12PLY STER LF (GAUZE/BANDAGES/DRESSINGS) IMPLANT
SPONGE LAP 4X18 X RAY DECT (DISPOSABLE) ×3 IMPLANT
STRIP CLOSURE SKIN 1/2X4 (GAUZE/BANDAGES/DRESSINGS) IMPLANT
SUT ETHILON 4 0 PS 2 18 (SUTURE) IMPLANT
SUT MNCRL AB 4-0 PS2 18 (SUTURE) ×3 IMPLANT
SUT SILK 2 0 SH (SUTURE) ×3 IMPLANT
SUT VIC AB 2-0 SH 27 (SUTURE)
SUT VIC AB 2-0 SH 27XBRD (SUTURE) IMPLANT
SUT VIC AB 4-0 P-3 18XBRD (SUTURE) IMPLANT
SUT VIC AB 4-0 P3 18 (SUTURE)
SUT VICRYL 3-0 CR8 SH (SUTURE) ×3 IMPLANT
SYR BULB 3OZ (MISCELLANEOUS) IMPLANT
SYR CONTROL 10ML LL (SYRINGE) ×3 IMPLANT
TAPE HYPAFIX 4 X10 (GAUZE/BANDAGES/DRESSINGS) IMPLANT
TOWEL OR NON WOVEN STRL DISP B (DISPOSABLE) ×3 IMPLANT
TUBE CONNECTING 20'X1/4 (TUBING) ×1
TUBE CONNECTING 20X1/4 (TUBING) ×2 IMPLANT
YANKAUER SUCT BULB TIP NO VENT (SUCTIONS) ×3 IMPLANT

## 2013-06-18 NOTE — Anesthesia Postprocedure Evaluation (Signed)
  Anesthesia Post-op Note  Patient: Pam Fuller  Procedure(s) Performed: Procedure(s): PARTIAL MASTECTOMY WITH NEEDLE LOCALIZATION (Left)  Patient Location: PACU  Anesthesia Type:General  Level of Consciousness: awake, alert  and oriented  Airway and Oxygen Therapy: Patient Spontanous Breathing and Patient connected to face mask oxygen  Post-op Pain: mild  Post-op Assessment: Post-op Vital signs reviewed  Post-op Vital Signs: Reviewed  Complications: No apparent anesthesia complications

## 2013-06-18 NOTE — Anesthesia Preprocedure Evaluation (Signed)
Anesthesia Evaluation  Patient identified by MRN, date of birth, ID band Patient awake    Reviewed: Allergy & Precautions, H&P , NPO status , Patient's Chart, lab work & pertinent test results  Airway Mallampati: I TM Distance: >3 FB Neck ROM: Full    Dental  (+) Teeth Intact and Dental Advisory Given   Pulmonary  breath sounds clear to auscultation        Cardiovascular hypertension, Pt. on medications Rhythm:Regular Rate:Normal     Neuro/Psych    GI/Hepatic GERD-  Medicated and Controlled,  Endo/Other    Renal/GU Dialysis and CRFRenal disease     Musculoskeletal   Abdominal   Peds  Hematology   Anesthesia Other Findings   Reproductive/Obstetrics                           Anesthesia Physical Anesthesia Plan  ASA: III  Anesthesia Plan: General   Post-op Pain Management:    Induction: Intravenous  Airway Management Planned: LMA  Additional Equipment:   Intra-op Plan:   Post-operative Plan: Extubation in OR  Informed Consent: I have reviewed the patients History and Physical, chart, labs and discussed the procedure including the risks, benefits and alternatives for the proposed anesthesia with the patient or authorized representative who has indicated his/her understanding and acceptance.   Dental advisory given  Plan Discussed with: CRNA, Anesthesiologist and Surgeon  Anesthesia Plan Comments:         Anesthesia Quick Evaluation

## 2013-06-18 NOTE — Telephone Encounter (Signed)
i will mail a letter/avs informing the pt about her appts on 08/13/13 w/labs@11am  and ov@ 11:30am...td

## 2013-06-18 NOTE — Interval H&P Note (Signed)
History and Physical Interval Note:  06/18/2013 12:43 PM  Pam Fuller  has presented today for surgery, with the diagnosis of breast cancer  The goals and the various methods of treatment have been discussed with the patient and family. After consideration of risks, benefits and other options for treatment, the patient has consented to  Procedure(s): PARTIAL MASTECTOMY WITH NEEDLE LOCALIZATION (Left) as a surgical intervention .  The patient's history has been reviewed, patient examined topday, no change in status, stable for surgery.  I have reviewed the patient's chart and labs.  Questions were answered to the patient's satisfaction.     Adin Hector

## 2013-06-18 NOTE — Telephone Encounter (Signed)
Pt of Dr. Dalbert Batman is ready for discharge and does not want the Norco for pain.   States she cannot take it; it is too strong.  Wants Tylenol #3 instead.  Dr. Dalbert Batman not available this afternoon.  Tylenol # 3, # 30 (thirty,) 1 po Q4H prn pain, no refill written by Dr. Georgette Dover (urgent office) for pick up at the front desk.  Her son will do so.

## 2013-06-18 NOTE — Discharge Instructions (Signed)
[facility] °Office Phone Number [phone] ° °BREAST BIOPSY/ PARTIAL MASTECTOMY: POST OP INSTRUCTIONS ° °Always review your discharge instruction sheet given to you by the facility where your surgery was performed. ° °IF YOU HAVE DISABILITY OR FAMILY LEAVE FORMS, YOU MUST BRING THEM TO THE OFFICE FOR PROCESSING.  DO NOT GIVE THEM TO YOUR DOCTOR. ° °1. A prescription for pain medication may be given to you upon discharge.  Take your pain medication as prescribed, if needed.  If narcotic pain medicine is not needed, then you may take acetaminophen (Tylenol) or ibuprofen (Advil) as needed. °2. Take your usually prescribed medications unless otherwise directed °3. If you need a refill on your pain medication, please contact your pharmacy.  They will contact our office to request authorization.  Prescriptions will not be filled after 5pm or on week-ends. °4. You should eat very light the first 24 hours after surgery, such as soup, crackers, pudding, etc.  Resume your normal diet the day after surgery. °5. Most patients will experience some swelling and bruising in the breast.  Ice packs and a good support bra will help.  Swelling and bruising can take several days to resolve.  °6. It is common to experience some constipation if taking pain medication after surgery.  Increasing fluid intake and taking a stool softener will usually help or prevent this problem from occurring.  A mild laxative (Milk of Magnesia or Miralax) should be taken according to package directions if there are no bowel movements after 48 hours. °7. Unless discharge instructions indicate otherwise, you may remove your bandages 24-48 hours after surgery, and you may shower at that time.  You may have steri-strips (small skin tapes) in place directly over the incision.  These strips should be left on the skin for 7-10 days.  If your surgeon used skin glue on the incision, you may shower in 24 hours.  The glue will flake off over the  next 2-3 weeks.  Any sutures or staples will be removed at the office during your follow-up visit. °8. ACTIVITIES:  You may resume regular daily activities (gradually increasing) beginning the next day.  Wearing a good support bra or sports bra minimizes pain and swelling.  You may have sexual intercourse when it is comfortable. °a. You may drive when you no longer are taking prescription pain medication, you can comfortably wear a seatbelt, and you can safely maneuver your car and apply brakes. °b. RETURN TO WORK:  ______________________________________________________________________________________ °9. You should see your doctor in the office for a follow-up appointment approximately two weeks after your surgery.  Your doctor’s nurse will typically make your follow-up appointment when she calls you with your pathology report.  Expect your pathology report 2-3 business days after your surgery.  You may call to check if you do not hear from us after three days. °10. OTHER INSTRUCTIONS: _______________________________________________________________________________________________ _____________________________________________________________________________________________________________________________________ °_____________________________________________________________________________________________________________________________________ °_____________________________________________________________________________________________________________________________________ ° °WHEN TO CALL YOUR DOCTOR: °1. Fever over 101.0 °2. Nausea and/or vomiting. °3. Extreme swelling or bruising. °4. Continued bleeding from incision. °5. Increased pain, redness, or drainage from the incision. ° °The clinic staff is available to answer your questions during regular business hours.  Please don’t hesitate to call and ask to speak to one of the nurses for clinical concerns.  If you have a medical emergency, go to the nearest  emergency room or call 911.  A surgeon from [facility] is always on call at the hospital. ° °For further questions, please visit [email or web address]  ° ° °  Post Anesthesia Home Care Instructions ° °Activity: °Get plenty of rest for the remainder of the day. A responsible adult should stay with you for 24 hours following the procedure.  °For the next 24 hours, DO NOT: °-Drive a car °-Operate machinery °-Drink alcoholic beverages °-Take any medication unless instructed by your physician °-Make any legal decisions or sign important papers. ° °Meals: °Start with liquid foods such as gelatin or soup. Progress to regular foods as tolerated. Avoid greasy, spicy, heavy foods. If nausea and/or vomiting occur, drink only clear liquids until the nausea and/or vomiting subsides. Call your physician if vomiting continues. ° °Special Instructions/Symptoms: °Your throat may feel dry or sore from the anesthesia or the breathing tube placed in your throat during surgery. If this causes discomfort, gargle with warm salt water. The discomfort should disappear within 24 hours. ° °

## 2013-06-18 NOTE — Op Note (Signed)
Patient Name:           Pam Fuller   Date of Surgery:        06/18/2013  Pre op Diagnosis:      New or recurrent Invasive mammary carcinoma left breast, 3:00 position, 5 mm tumor, receptor positive, HER-2/neu negative.  13 years postop left partial mastectomy, sentinel node biopsy and adjuvant chemotherapy and radiation therapy for T1B, N0 at that point.    Post op Diagnosis:    same  Procedure:                 Left partial mastectomy with needle localization  Surgeon:                     Edsel Petrin. Dalbert Batman, M.D., FACS  Assistant:                      None  Operative Indications:    This very pleasant 80 year old woman has end-stage renal disease on dialysis for 2 years but she is independent, drives her car, and lives with one of her 2 sons. She is followed by Dr. Heath Gold for primary care and Dr. Broadus JohnLittie Deeds for nephrology. Dialysis on Monday Wednesday Friday.  She was diagnosed with cancer of the left breast, upper outer quadrant in 2001. On 05/31/1999 she underwent left partial mastectomy and left axillary sentinel node biopsy by Dr. Ronnie Derby. We have those records demonstrating a 0.9 cm tumor, receptor positive, HER-2/neu negative, node negative. Pathologic stage TI B., N0. She received adjuvant chemotherapy as well as adjuvant radiation therapy. She said she got a bad burn in the lower outer quadrant of the left breast from the radiation therapy and had lots of desquamation. There is a hyperpigmented area there.  She went for screening mammograms at [facility]. She also had ultrasound. They found a 5 mm mass in the left breast at 3:00 position and a small cyst in the left axilla tail. Image guided biopsy of the 5 mm mass at 3:00 position shows invasive mammary carcinoma, lobular features, receptor positive, Ki-67 8%, HER-2/neu negative.  I have discussed this with her and her son twice as an outpatient. She has seen Dr.  Jana Hakim. She is aware that the standard of care in her  situation is a left total mastectomy, but she has declined that and instead has decided to have a left partial mastectomy with needle localization and probable postop antiestrogen therapy Comorbidities include end-stage renal disease, history of breast cancer, hypertension, gout, GERD.[name]  She was scheduled for MRI by Dr. Jana Hakim. That study was canceled by the radiologist stated they could not get a dialysis patient the contrast. We have discussed this, specifically outlining the sensitivity and specificity issues with MRI, and decided not to press the issue   Operative Findings:       The localizing wire entered the left breast from a very lateral position and was directed medially near the marker clip. The breast tissues were very dense and fibrotic, presumably secondary to prior surgery and radiation boost in that area. Specimen mammogram looked very good with a marker clip and wire and near the center of the specimen. There was one small very hard very fibrotic area of tissue posteriorly which was discontinuous with the primary area. I felt this was most likely fibrosis but I excised this conservatively and sent as a separate specimen.  Procedure in Detail:  The localizing wire was well placed, and she was brought to the operating room at [facility] day surgery center. Gen. Anesthesia with LMA device was induced. Intravenous antibiotics were given. Surgical time out was performed. The left breast was prepped and draped in a sterile fashion.    0.25% Marcaine with epinephrine was used as local infiltration anesthetic.A radially oriented incision was made in the left breast at about the 4:00 position. Dissection was carried down into the breast tissue around the localizing wire. We took this widely and deeply. The specimen was removed and marked with silk sutures and a 6 color ink kit.   the specimen mammogram looked good. The specimen was marked and sent to the pathology lab. I saw one small fibrotic  area in the very posterior aspect of the lumpectomy cavity.  I conservatively cut this out and sent it as a second specimen. Hemostasis was excellent and achieved with electrocautery. The wound was irrigated saline. The breast tissues were closed in 3 layers, a deep layer of interrupted 3-0 Vicryl, the superficial layer of 0 Vicryl, and the skin was closed with a running subcuticular suture of 4-0 Monocryl and Dermabond. Elastic binder was placed. The patient taken to recovery room in stable condition. EBL 10 cc. Counts correct. Complications none.          Edsel Petrin. Dalbert Batman, M.D., FACS General and Minimally Invasive Surgery Breast and Colorectal Surgery  06/18/2013 2:02 PM

## 2013-06-18 NOTE — Transfer of Care (Signed)
Immediate Anesthesia Transfer of Care Note  Patient: Pam Fuller  Procedure(s) Performed: Procedure(s): PARTIAL MASTECTOMY WITH NEEDLE LOCALIZATION (Left)  Patient Location: PACU  Anesthesia Type:General  Level of Consciousness: awake, alert  and oriented  Airway & Oxygen Therapy: Patient Spontanous Breathing and Patient connected to face mask oxygen  Post-op Assessment: Report given to PACU RN and Post -op Vital signs reviewed and stable  Post vital signs: Reviewed and stable  Complications: No apparent anesthesia complications

## 2013-06-19 ENCOUNTER — Encounter (HOSPITAL_BASED_OUTPATIENT_CLINIC_OR_DEPARTMENT_OTHER): Payer: Self-pay | Admitting: General Surgery

## 2013-06-22 NOTE — Progress Notes (Signed)
No show

## 2013-06-23 ENCOUNTER — Encounter (INDEPENDENT_AMBULATORY_CARE_PROVIDER_SITE_OTHER): Payer: Self-pay | Admitting: Internal Medicine

## 2013-06-23 ENCOUNTER — Telehealth (INDEPENDENT_AMBULATORY_CARE_PROVIDER_SITE_OTHER): Payer: Self-pay | Admitting: General Surgery

## 2013-06-23 ENCOUNTER — Encounter (INDEPENDENT_AMBULATORY_CARE_PROVIDER_SITE_OTHER): Payer: Self-pay

## 2013-06-23 NOTE — Telephone Encounter (Signed)
Surgical pathology report of her left lumpectomy shows invasive ductal carcinoma, grade 1, spanning 7 mm. The cancer is probably less than 1 mm from the superior margin. I had reexcised the posterior margin that was negative. Estrogen receptor 100%. Progesterone receptor 94%. Her 2-Neu negative.  I called Pam Fuller and discussed this with her and her son.  I told her that most likely we would advise her to have this area conservatively re-excised at the superior margin. She wants to think about this which is fine. We are going to make her an appointment to come in and discuss this with me in the near future.   Edsel Petrin. Dalbert Batman, M.D., [facility] General and Minimally invasive Surgery Breast and Colorectal Surgery Office:   [phone] Pager:   [phone]

## 2013-06-25 NOTE — Progress Notes (Signed)
Quick Note:  Inform patient of Pathology report,. ______ 

## 2013-07-02 ENCOUNTER — Ambulatory Visit (INDEPENDENT_AMBULATORY_CARE_PROVIDER_SITE_OTHER): Payer: Medicare Other | Admitting: General Surgery

## 2013-07-02 ENCOUNTER — Encounter (INDEPENDENT_AMBULATORY_CARE_PROVIDER_SITE_OTHER): Payer: Self-pay | Admitting: General Surgery

## 2013-07-02 ENCOUNTER — Telehealth (INDEPENDENT_AMBULATORY_CARE_PROVIDER_SITE_OTHER): Payer: Self-pay

## 2013-07-02 DIAGNOSIS — C50412 Malignant neoplasm of upper-outer quadrant of left female breast: Secondary | ICD-10-CM

## 2013-07-02 DIAGNOSIS — C50419 Malignant neoplasm of upper-outer quadrant of unspecified female breast: Secondary | ICD-10-CM

## 2013-07-02 NOTE — Progress Notes (Signed)
Patient ID: Pam Fuller, female   DOB: 26-Dec-1932, 80 y.o.   MRN: [id number] History: This patient returns for her first postoperative visit. On 06/18/2013 she underwent left partial mastectomy with needle localization. This was performed because of new or recurrent invasive mammary carcinoma of the left breast, 3:00 position, receptor positive, HER-2-negative. She is 13 years postop left partial mastectomy, sentinel lymph biopsy, as the chemotherapy and radiation therapy for a T1 B., N0 tumor. She has end-stage renal disease on dialysis for the past 2 years. She is here with her side.     She is doing fairly well from her lumpectomy. She notices a little thickening but no and no drainage and no significant pain.    Her pathology report shows invasive cancer, this is broadly less than 1 mm from the superior margin. All other margins are negative. The tumor was 7 mm diameter. I have discussed the pathology report with the patient and her son and a bra pictures.  Exam: Patient looks well. Experience. Very good conversation. No distress Left breast shows a healing incision laterally. Minimal thickening. Good contour. Some volume loss. Otherwise excellent cosmesis.  Assessment:  Invasive mammary carcinoma left breast, receptor positive, 7 mm tumor. HER-2 negative. Recovering uneventfully following left partial mastectomy. The tumor is broadly less than 1 mm from the superior margin  13 years postop left partial mastectomy sentinel node biopsy and adjuvant chemotherapy and radiation therapy for T1 B., N0 tumor. The patient declined total mastectomy for this recurrence.  Plan:    We had a long discussion, and it was a good conversation. She is aware of the options of reexcision for better margins.   patient is also aware of the option of simply proceeding with antiestrogen therapy under the guidance of Dr. Jana Hakim. She knows that theoretically she is at increased risk for local recurrence with a  close or positive margin, but she is not inclined to have any more surgery unless it is absolutely necessary. I told her that it was reasonable to hold off and get further opinions at this time.    She will see Dr. Jana Hakim in the near future to see what his opinion about simply proceeding with antiestrogen therapy is.    She knows that she cannot have any further radiation therapy because of prior treatment    Return to see me in 6 weeks.    Edsel Petrin. Dalbert Batman, M.D., [facility] General and Minimally invasive Surgery Breast and Colorectal Surgery Office:   [phone] Pager:   [phone]

## 2013-07-02 NOTE — Patient Instructions (Signed)
Your left breast lumpectomy wound appears to be healing normally. The thickening in the scar will go away slowly over the next few months.  We have discussed your pathology report. We have discussed the very close margin. We discussed the fact that this tumor is estrogen receptor positive.  We have discussed the option of further surgery to get a better margin. We have also discussed the option of proceeding with antiestrogen therapy and close clinical followup.  At this time you have stated he would like to hold off on any further surgery until you go back and see Dr. Jana Hakim to discuss your medical treatment options.  Return to see Dr. Dalbert Batman in 6 weeks.

## 2013-07-02 NOTE — Telephone Encounter (Signed)
Pt seen today in office and requested sooner appt with Dr Jana Hakim. I have sent staff msg to [facility] at [facility] to review for sooner appt.

## 2013-08-03 ENCOUNTER — Telehealth (INDEPENDENT_AMBULATORY_CARE_PROVIDER_SITE_OTHER): Payer: Self-pay

## 2013-08-03 ENCOUNTER — Encounter (INDEPENDENT_AMBULATORY_CARE_PROVIDER_SITE_OTHER): Payer: Self-pay

## 2013-08-03 NOTE — Telephone Encounter (Signed)
Telephone call to patient.  Need to inform her of recheck appointment with Dr. Dalbert Batman on 08/26/13 at 5:00pm.

## 2013-08-10 ENCOUNTER — Encounter (INDEPENDENT_AMBULATORY_CARE_PROVIDER_SITE_OTHER): Payer: Self-pay

## 2013-08-10 ENCOUNTER — Telehealth (INDEPENDENT_AMBULATORY_CARE_PROVIDER_SITE_OTHER): Payer: Self-pay

## 2013-08-10 NOTE — Telephone Encounter (Signed)
I have attempted to contact patient multiple times, the phone does not have a voice mail and goes to a fax machine.  Pt has left messages with triage, she cannot make the April 8th post operative appointment.  I rescheduled this patient for Monday, April 13 at 3:30pm with Dr. Dalbert Batman.  Letter sent to patient's home address on file with this information as well.

## 2013-08-13 ENCOUNTER — Ambulatory Visit (HOSPITAL_BASED_OUTPATIENT_CLINIC_OR_DEPARTMENT_OTHER): Payer: Medicare Other | Admitting: Oncology

## 2013-08-13 ENCOUNTER — Encounter (INDEPENDENT_AMBULATORY_CARE_PROVIDER_SITE_OTHER): Payer: Medicare Other | Admitting: General Surgery

## 2013-08-13 ENCOUNTER — Other Ambulatory Visit (HOSPITAL_BASED_OUTPATIENT_CLINIC_OR_DEPARTMENT_OTHER): Payer: Medicare Other

## 2013-08-13 VITALS — BP 125/73 | HR 76 | Temp 98.5°F | Resp 18 | Ht 62.0 in | Wt 164.5 lb

## 2013-08-13 DIAGNOSIS — C50419 Malignant neoplasm of upper-outer quadrant of unspecified female breast: Secondary | ICD-10-CM

## 2013-08-13 DIAGNOSIS — K219 Gastro-esophageal reflux disease without esophagitis: Secondary | ICD-10-CM

## 2013-08-13 DIAGNOSIS — N186 End stage renal disease: Secondary | ICD-10-CM

## 2013-08-13 DIAGNOSIS — C50919 Malignant neoplasm of unspecified site of unspecified female breast: Secondary | ICD-10-CM

## 2013-08-13 DIAGNOSIS — Z170 Estrogen receptor positive status [ER+]: Secondary | ICD-10-CM

## 2013-08-13 DIAGNOSIS — C50412 Malignant neoplasm of upper-outer quadrant of left female breast: Secondary | ICD-10-CM

## 2013-08-13 DIAGNOSIS — Z992 Dependence on renal dialysis: Secondary | ICD-10-CM

## 2013-08-13 LAB — COMPREHENSIVE METABOLIC PANEL (CC13)
ALT: 13 U/L (ref 0–55)
ANION GAP: 17 meq/L — AB (ref 3–11)
AST: 16 U/L (ref 5–34)
Albumin: 3.7 g/dL (ref 3.5–5.0)
Alkaline Phosphatase: 130 U/L (ref 40–150)
BILIRUBIN TOTAL: 0.65 mg/dL (ref 0.20–1.20)
BUN: 24.3 mg/dL (ref 7.0–26.0)
CALCIUM: 10.2 mg/dL (ref 8.4–10.4)
CHLORIDE: 101 meq/L (ref 98–109)
CO2: 25 meq/L (ref 22–29)
Creatinine: 4.9 mg/dL (ref 0.6–1.1)
Glucose: 111 mg/dl (ref 70–140)
POTASSIUM: 3.6 meq/L (ref 3.5–5.1)
SODIUM: 142 meq/L (ref 136–145)
TOTAL PROTEIN: 7.8 g/dL (ref 6.4–8.3)

## 2013-08-13 LAB — CBC WITH DIFFERENTIAL/PLATELET
BASO%: 0.9 % (ref 0.0–2.0)
Basophils Absolute: 0.1 10*3/uL (ref 0.0–0.1)
EOS%: 4.5 % (ref 0.0–7.0)
Eosinophils Absolute: 0.3 10*3/uL (ref 0.0–0.5)
HCT: 33.4 % — ABNORMAL LOW (ref 34.8–46.6)
HGB: 10.8 g/dL — ABNORMAL LOW (ref 11.6–15.9)
LYMPH#: 1.3 10*3/uL (ref 0.9–3.3)
LYMPH%: 20.8 % (ref 14.0–49.7)
MCH: 29.6 pg (ref 25.1–34.0)
MCHC: 32.3 g/dL (ref 31.5–36.0)
MCV: 91.6 fL (ref 79.5–101.0)
MONO#: 0.3 10*3/uL (ref 0.1–0.9)
MONO%: 4.9 % (ref 0.0–14.0)
NEUT#: 4.4 10*3/uL (ref 1.5–6.5)
NEUT%: 68.9 % (ref 38.4–76.8)
Platelets: 265 10*3/uL (ref 145–400)
RBC: 3.65 10*6/uL — AB (ref 3.70–5.45)
RDW: 17 % — ABNORMAL HIGH (ref 11.2–14.5)
WBC: 6.3 10*3/uL (ref 3.9–10.3)

## 2013-08-13 MED ORDER — TAMOXIFEN CITRATE 20 MG PO TABS: 20.0000 mg | ORAL_TABLET | Freq: Every day | ORAL | Status: DC

## 2013-08-13 NOTE — Progress Notes (Signed)
You will in her would like me to Dr. Dr. Iona Beard or you get a home ID: Pam Fuller OB: [birth year]  MR#: [id number]  LTJ#:[id number]  PCP: Salena Saner., MD GYN:   SUFanny Skates OTHER MD: Donato Heinz  CHIEF COMPLAINT: "My mammogram showed I had breast cancer".  HISTORY OF PRESENT ILLNESS: Pam Fuller underwent left lumpectomy and sentinel lymph node sampling under her Dr. Kathrin Penner 05/31/1999 for a 9 mm invasive ductal carcinoma, grade 2, with 0 of 3 sentinel lymph nodes involved. The tumor was 97% estrogen receptor positive, 98% progesterone receptor positive, and had a proliferation marker of 10%. HER-2/neu was negative by the HercepTest. It was diploid. After completing adjuvant radiation treatment the patient took tamoxifen for 5 years, completed in the spring of 2006.  More recently, in 03/19/2013 the patient had bilateral screening mammography at [facility]. There was a focal asymmetry in the left breast at the 1:00 position which by ultrasound on the same day proved to be a 5 mm irregular mass. A 4 mm complex cyst was also noted in the left axillary tail, consistent with a sebaceous cyst.  Biopsy of the left breast mass in question 03/26/2013 showed an invasive ductal carcinoma, with lobular features, grade 1, estrogen receptor 100% positive, progesterone receptor 94% positive, with an MIB-1 of 8% and no HER-2 amplification.  The patient's subsequent history is as detailed below  INTERVAL HISTORY: Pam Fuller returns today for followup of her breast cancer. Since her last visit here she had her definitive surgery. This was on 06/18/2013, and showed ([id number]) a 7 mm invasive ductal carcinoma, grade 1, with close but negative margins. Repeat HER-2 was again negative.    ROS:   she tolerated the surgery well, with no unusual bleeding, fever, swelling, or other complications. Of course she continues to receive hemodialysis Monday Wednesday and Friday. Sitting therefore 4  hours: Drives her crazy", but she reads, sleeps, and wanted [place] on TV. She has significant dental issues, a little bit of itching in her left ear, heartburn, and of course her arthritis pains. She has rare headaches, which are not more frequent or persistent than before. Otherwise a detailed review of systems today was noncontributory   PAST MEDICAL HISTORY: Past Medical History  Diagnosis Date  . Hypertension   . Arthritis   . Blood transfusion without reported diagnosis   . Renal disorder     patient on dialysis  . Cancer     patient states she had br ca 13 years ago  . ESRD (end stage renal disease) on dialysis   . Wears glasses     PAST SURGICAL HISTORY: Past Surgical History  Procedure Laterality Date  . Abdominal hysterectomy    . Breast lumpectomy with axillary lymph node biopsy  2001    left  . Appendectomy    . Tonsillectomy    . Colonoscopy    . Partial mastectomy with needle localization Left 06/18/2013    Procedure: PARTIAL MASTECTOMY WITH NEEDLE LOCALIZATION;  Surgeon: Adin Hector, MD;  Location: [facility];  Service: General;  Laterality: Left;    FAMILY HISTORY Family History  Problem Relation Age of Onset  . Cancer Maternal Aunt     breast   the patient's father died in his 41s from what appears to have been complications of atherosclerotic cardiovascular disease. The patient's mother died in her 07M from complications of diabetes. Pam Fuller has one adopted brother. She has 2 full sisters. There is no breast  or ovarian cancer in the immediate family. One of the patient's mother is 3 sisters was diagnosed with breast cancer but the patient does not know at what age.  GYNECOLOGIC HISTORY:  Menarche age 73, first live birth age 69. She is GX P2. She underwent hysterectomy remotely, but does not know if her ovaries were removed. 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 She is a widow and lives with her son Milbert Coulter, who is self-employed in Advertising copywriter. Son Mateo Flow is completing his PhD in business and health care. The patient has 3 grandchildren she attends a local [place]: Not in place; the patient was given the appropriate documents on her 04/21/2013 visit to declare a healthcare power of attorney   HEALTH MAINTENANCE: History  Substance Use Topics  . Smoking status: Never Smoker   . Smokeless tobacco: Never Used  . Alcohol Use: No     Colonoscopy:  PAP:  Bone density:  Lipid panel:  Allergies  Allergen Reactions  . Erythromycin Nausea And Vomiting  . Vicodin [Hydrocodone-Acetaminophen]     Current Outpatient Prescriptions  Medication Sig Dispense Refill  . amLODipine (NORVASC) 10 MG tablet       . cloNIDine (CATAPRES) 0.1 MG tablet       . colchicine 0.6 MG tablet Take 0.6 mg by mouth 2 (two) times daily.      . fexofenadine-pseudoephedrine (ALLEGRA-D 24) 180-240 MG per 24 hr tablet Take 1 tablet by mouth daily.      . fluticasone (FLONASE) 50 MCG/ACT nasal spray Place 2 sprays into the nose daily.      . hydrALAZINE (APRESOLINE) 50 MG tablet       . omeprazole (PRILOSEC OTC) 20 MG tablet Take 20 mg by mouth daily.      . ondansetron (ZOFRAN ODT) 4 MG disintegrating tablet 45m ODT q4 hours prn nausea/vomit  8 tablet  0   No current facility-administered medications for this visit.    OBJECTIVE: [name] in no acute distress  Filed Vitals:   08/13/13 1145  BP: 125/73  Pulse: 76  Temp: 98.5 F (36.9 C)  Resp: 18     Body mass index is 30.08 kg/(m^2).    ECOG FS:0 - Asymptomatic  Ocular: Sclerae unicteric, bilateral  arcus senilis Ear-nose-throat: Oropharynx  clear and moist  Lymphatic: No cervical or supraclavicular adenopathy Lungs no rales or rhonchi Heart regular rate and rhythm, 3/6 systolic murmur appreciated as before  Abd soft,  nontender, positive bowel sounds, no masses palpated MSK no focal spinal tenderness, fistula and left upper extremity as previously noted  Neuro: non-focal, well-oriented,  positive  affect Breasts: The right breast is unremarkable. The left breast is darker and  smaller than the right. the new surgical incision is healing very nicely. There is no evidence of dehiscence, erythema, or swelling. There is no unusual tenderness.  The left axilla is benign.   LAB RESULTS:  CMP     Component Value Date/Time   NA 139 06/17/2013 1430   K 4.3 06/17/2013 1430   CL 97 06/17/2013 1430   CO2 23 06/17/2013 1430   GLUCOSE 111* 06/17/2013 1430   BUN 33* 06/17/2013 1430   CREATININE 6.78* 06/17/2013 1430   CALCIUM 8.3* 06/17/2013 1430   CALCIUM 7.0* 04/03/2010 1225   PROT 7.7 06/17/2013 1430   ALBUMIN  3.6 06/17/2013 1430   AST 19 06/17/2013 1430   ALT 18 06/17/2013 1430   ALKPHOS 117 06/17/2013 1430   BILITOT 0.5 06/17/2013 1430   GFRNONAA 5* 06/17/2013 1430   GFRAA 6* 06/17/2013 1430    I No results found for this basename: SPEP,  UPEP,   kappa and lambda light chains    Lab Results  Component Value Date   WBC 6.3 08/13/2013   NEUTROABS 4.4 08/13/2013   HGB 10.8* 08/13/2013   HCT 33.4* 08/13/2013   MCV 91.6 08/13/2013   PLT 265 08/13/2013      Chemistry      Component Value Date/Time   NA 139 06/17/2013 1430   K 4.3 06/17/2013 1430   CL 97 06/17/2013 1430   CO2 23 06/17/2013 1430   BUN 33* 06/17/2013 1430   CREATININE 6.78* 06/17/2013 1430      Component Value Date/Time   CALCIUM 8.3* 06/17/2013 1430   CALCIUM 7.0* 04/03/2010 1225   ALKPHOS 117 06/17/2013 1430   AST 19 06/17/2013 1430   ALT 18 06/17/2013 1430   BILITOT 0.5 06/17/2013 1430       No results found for this basename: LABCA2    No components found with this basename: LABCA125    No results found for this basename: INR,  in the last 168 hours  Urinalysis    Component Value Date/Time   COLORURINE YELLOW 03/31/2010 [place] 03/31/2010 1319   LABSPEC 1.013 03/31/2010 1319   PHURINE 6.0 03/31/2010 1319   GLUCOSEU 100* 03/31/2010 1319   HGBUR SMALL* 03/31/2010 1319   BILIRUBINUR NEGATIVE 03/31/2010 [place] 03/31/2010 1319   PROTEINUR >300* 03/31/2010 1319   UROBILINOGEN 0.2 03/31/2010 1319   NITRITE NEGATIVE 03/31/2010 1319   LEUKOCYTESUR NEGATIVE 03/31/2010 1319    STUDIES: No results found.  ASSESSMENT: 80 y.o. [place] woman  (1) status post left lumpectomy and sentinel lymph node sampling January of 2001 for a pT1b pN0, stage IA invasive ductal carcinoma, grade 2, estrogen receptor 97% positive, progesterone receptor 90% positive, with an MIB-1 of 10%  (2) status post adjuvant radiation to the left breast  (3) status post tamoxifen given for 5 years, completed February of 2006  (4) status post left breast biopsy 03/26/2013 for a clinical T1a N0, stage IA invasive ductal carcinoma, with lobular features, grade 1, estrogen receptor 100% positive, progesterone receptor 94% positive, with an MIB-1 of 8% and no HER-2 amplification  (5) status post left lumpectomy 06/18/2013 for a pT1b NX, stage IA invasive ductal carcinoma, grade 1, repeat HER-2 testing again negative  (6) tamoxifen started March 2015   PLAN:  Pam Fuller's case was discussed at the multidisciplinary breast cancer conference 08/12/2013. Of course she cannot have any further radiation to the left breast. The plan is going to be for anti-estrogens only. Given her being on hemodialysis I am more concerned about her bones than about the possibility of clotting, particularly since she took tamoxifen 5 years previously without any complications.  The plan accordingly will be to go back on tamoxifen and she has a good understanding of the possible toxicities, side effects, or complications of this agent. I put in her prescription and she will see [place] again in 4 months. If she is tolerating tamoxifen well, as I  expect, we can see her 8 months after that and then likely yearly until she completes her tamoxifen.    Pam Fuller has a good understanding of the overall  plan. She agrees with it. She will call with any problems that may develop before next visit. Chauncey Cruel, MD   08/13/2013 12:08 PM

## 2013-08-13 NOTE — Addendum Note (Signed)
Addended by: Laureen Abrahams on: 08/13/2013 05:40 PM   Modules accepted: Orders, Medications

## 2013-08-14 ENCOUNTER — Telehealth: Payer: Self-pay | Admitting: Oncology

## 2013-08-14 NOTE — Telephone Encounter (Signed)
s.w. pt and advised on July appt....mailed pt appt sched/avs adn letter

## 2013-08-18 ENCOUNTER — Telehealth: Payer: Self-pay | Admitting: Oncology

## 2013-08-18 NOTE — Telephone Encounter (Signed)
, °

## 2013-08-26 ENCOUNTER — Encounter (INDEPENDENT_AMBULATORY_CARE_PROVIDER_SITE_OTHER): Payer: Medicare Other | Admitting: General Surgery

## 2013-08-31 ENCOUNTER — Encounter (INDEPENDENT_AMBULATORY_CARE_PROVIDER_SITE_OTHER): Payer: Medicare Other | Admitting: General Surgery

## 2013-09-24 ENCOUNTER — Encounter (INDEPENDENT_AMBULATORY_CARE_PROVIDER_SITE_OTHER): Payer: Medicare Other | Admitting: General Surgery

## 2013-10-01 ENCOUNTER — Encounter (INDEPENDENT_AMBULATORY_CARE_PROVIDER_SITE_OTHER): Payer: Self-pay | Admitting: General Surgery

## 2013-11-04 ENCOUNTER — Emergency Department (HOSPITAL_COMMUNITY)
Admission: EM | Admit: 2013-11-04 | Discharge: 2013-11-05 | Disposition: A | Discharge status: Home / Self Care | Payer: Medicare Other | Attending: Emergency Medicine | Admitting: Emergency Medicine

## 2013-11-04 ENCOUNTER — Encounter (HOSPITAL_COMMUNITY): Payer: Self-pay | Admitting: Emergency Medicine

## 2013-11-04 DIAGNOSIS — I120 Hypertensive chronic kidney disease with stage 5 chronic kidney disease or end stage renal disease: Secondary | ICD-10-CM | POA: Insufficient documentation

## 2013-11-04 DIAGNOSIS — N186 End stage renal disease: Secondary | ICD-10-CM | POA: Insufficient documentation

## 2013-11-04 DIAGNOSIS — Z992 Dependence on renal dialysis: Secondary | ICD-10-CM | POA: Insufficient documentation

## 2013-11-04 DIAGNOSIS — H109 Unspecified conjunctivitis: Secondary | ICD-10-CM

## 2013-11-04 DIAGNOSIS — Z853 Personal history of malignant neoplasm of breast: Secondary | ICD-10-CM | POA: Insufficient documentation

## 2013-11-04 DIAGNOSIS — Z8739 Personal history of other diseases of the musculoskeletal system and connective tissue: Secondary | ICD-10-CM | POA: Insufficient documentation

## 2013-11-04 DIAGNOSIS — Z79899 Other long term (current) drug therapy: Secondary | ICD-10-CM | POA: Insufficient documentation

## 2013-11-04 MED ORDER — TETRACAINE HCL 0.5 % OP SOLN
1.0000 [drp] | Freq: Once | OPHTHALMIC | Status: AC
  Administered 2013-11-05: 1 [drp] via OPHTHALMIC
  Filled 2013-11-04: qty 2

## 2013-11-04 MED ORDER — FLUORESCEIN SODIUM 1 MG OP STRP
1.0000 | ORAL_STRIP | Freq: Once | OPHTHALMIC | Status: AC
  Administered 2013-11-05: 1 via OPHTHALMIC
  Filled 2013-11-04: qty 1

## 2013-11-04 NOTE — ED Notes (Signed)
Visual acuity: pt wears glasses and has bilateral cataracts. Pt unable to perform visual acuity properly, however she did c/o worsening vision in her left eye

## 2013-11-04 NOTE — ED Notes (Signed)
Pt c/o L eye irritation and drainage x 2 days. Pt does not think she has any visual changes

## 2013-11-04 NOTE — ED Provider Notes (Signed)
CSN: [id number]     Arrival date & time 11/04/13  2035 History   First MD Initiated Contact with Patient 11/04/13 2255     Chief Complaint  Patient presents with  . Eye Drainage     (Consider location/radiation/quality/duration/timing/severity/associated sxs/prior Treatment) HPI Comments: Patient is a 80 year old female, wears glasses, history of cataract presents emergency room chief complaint of left eye drainage and irritation for 2 days. She reports watery discharge and crusting in the morning. She is unaware if her vision has changed due to cataract in left eye. She denies fever, chills, headache. She denies known exposure to sick contacts.  She denies a headache but reports frontal "heaviness". She denies lightheadedness, dizziness.  Patient is a 80 y.o. female presenting with eye problem. The history is provided by the patient. No language interpreter was used.  Eye Problem Location:  L eye Quality: puritic  Severity:  Moderate Onset quality:  Gradual Duration:  2 days Timing:  Constant Progression:  Unchanged Chronicity:  New Context: not chemical exposure, not contact lens problem, not direct trauma and not foreign body   Relieved by:  Heat Worsened by:  Nothing tried Associated symptoms: crusting and discharge   Associated symptoms: no headaches and no nausea     Past Medical History  Diagnosis Date  . Hypertension   . Arthritis   . Blood transfusion without reported diagnosis   . Renal disorder     patient on dialysis  . Cancer     patient states she had br ca 13 years ago  . ESRD (end stage renal disease) on dialysis   . Wears glasses    Past Surgical History  Procedure Laterality Date  . Abdominal hysterectomy    . Breast lumpectomy with axillary lymph node biopsy  2001    left  . Appendectomy    . Tonsillectomy    . Colonoscopy    . Partial mastectomy with needle localization Left 06/18/2013    Procedure: PARTIAL MASTECTOMY WITH NEEDLE LOCALIZATION;   Surgeon: Adin Hector, MD;  Location: [facility];  Service: General;  Laterality: Left;  . Breast surgery     Family History  Problem Relation Age of Onset  . Cancer Maternal Aunt     breast   History  Substance Use Topics  . Smoking status: Never Smoker   . Smokeless tobacco: Never Used  . Alcohol Use: No   OB History   Grav Para Term Preterm Abortions TAB SAB Ect Mult Living                 Review of Systems  Constitutional: Negative for fever and chills.  Eyes: Positive for discharge.  Gastrointestinal: Negative for nausea and abdominal pain.  Neurological: Negative for light-headedness and headaches.      Allergies  Erythromycin and Vicodin  Home Medications   Prior to Admission medications   Medication Sig Start Date End Date Taking? Authorizing Lluvia Gwynne  amLODipine (NORVASC) 10 MG tablet Take 10 mg by mouth daily.  03/31/13  Yes Historical Hudsyn Champine, MD  cloNIDine (CATAPRES) 0.1 MG tablet Take 0.1 mg by mouth 2 (two) times daily.  03/24/13  Yes Historical Samauri Kellenberger, MD  colchicine 0.6 MG tablet Take 0.6 mg by mouth 2 (two) times daily.   Yes Historical Tinsleigh Slovacek, MD  hydrALAZINE (APRESOLINE) 50 MG tablet Take 50 mg by mouth daily.  04/14/13  Yes Historical Krishika Bugge, MD  tamoxifen (NOLVADEX) 20 MG tablet Take 1 tablet (20 mg total) by mouth  daily. 08/13/13  Yes Chauncey Cruel, MD   BP 133/54  Pulse 88  Temp(Src) 100.1 F (37.8 C) (Oral)  Resp 18  Ht 5\' 2"  (1.575 m)  Wt 168 lb (76.204 kg)  BMI 30.72 kg/m2  SpO2 99% Physical Exam  Nursing note and vitals reviewed. Constitutional: She is oriented to person, place, and time. She appears well-developed and well-nourished. No distress.  HENT:  Head: Normocephalic and atraumatic.  Eyes: EOM are normal. Pupils are equal, round, and reactive to light. Right eye exhibits no discharge and no exudate. No foreign body present in the right eye. Left eye exhibits discharge and exudate. No foreign body  present in the left eye. Right conjunctiva is not injected. Left conjunctiva is injected.  Slit lamp exam:      The left eye shows no fluorescein uptake.  No dendritic lesions IOP: Left: 13-15 Right:15-18  Neck: Neck supple.  Pulmonary/Chest: Effort normal. No respiratory distress.  Neurological: She is alert and oriented to person, place, and time.  Skin: Skin is warm and dry. She is not diaphoretic.  Psychiatric: She has a normal mood and affect. Her behavior is normal.    ED Course  Procedures (including critical care time) Labs Review Labs Reviewed - No data to display  Imaging Review No results found.   EKG Interpretation None      MDM   Final diagnoses:  Conjunctivitis   Patient with history of left high drainage and discomfort for 2 days, no pleural uptake on exam, no obvious foreign bodies. Normal intraocular pressure.  Temperature 100.1 will reevaluate. Normal pressure.  Discussed patient, condition with Dr. Venora Maples who also evaluated the patient during this encounter and agrees with current workup and plan. Discussed and treatment plan with the patient. Return precautions given. Reports understanding and no other concerns at this time.  Patient is stable for discharge at this time.  Meds given in ED:  Medications  tetracaine (PONTOCAINE) 0.5 % ophthalmic solution 1 drop (1 drop Both Eyes Given by Other 11/05/13 0012)  fluorescein ophthalmic strip 1 strip (1 strip Left Eye Given by Other 11/05/13 0012)    Discharge Medication List as of 11/05/2013  1:05 AM    START taking these medications   Details  ciprofloxacin (CILOXAN) 0.3 % ophthalmic solution Place 2 drops into the left eye 4 (four) times daily. Place one drop in effected eye every 4 hours until follow up with opthalmologist, Starting 11/05/2013, Until Discontinued, [place], PA-C 11/06/13 [phone]

## 2013-11-05 MED ORDER — CIPROFLOXACIN HCL 0.3 % OP SOLN: 2.0000 [drp] | Freq: Four times a day (QID) | OPHTHALMIC | Status: DC

## 2013-11-05 NOTE — Discharge Instructions (Signed)
Call for a follow up appointment with a Family or Primary Care Provider.  Call your ophthalmologist for further evaluation of your eye irritation. Return if Symptoms worsen.   Take medication as prescribed.  Wash her hands frequently. Avoid scratching the eye.

## 2013-11-06 NOTE — ED Provider Notes (Signed)
Medical screening examination/treatment/procedure(s) were conducted as a shared visit with non-physician practitioner(s) and myself.  I personally evaluated the patient during the encounter.   EKG Interpretation None      Well appearing. Will have optho follow up. Cover with abx for potential bacterial infection   Hoy Morn, MD 11/06/13 [phone]

## 2013-11-26 ENCOUNTER — Telehealth: Payer: Self-pay | Admitting: *Deleted

## 2013-11-26 NOTE — Telephone Encounter (Signed)
Notified pt of appointment time and provider change for 12/10/13

## 2013-12-10 ENCOUNTER — Other Ambulatory Visit: Payer: Medicare Other

## 2013-12-10 ENCOUNTER — Ambulatory Visit: Payer: Medicare Other

## 2014-01-29 ENCOUNTER — Telehealth: Payer: Self-pay | Admitting: Oncology

## 2014-01-29 NOTE — Telephone Encounter (Signed)
, °

## 2014-02-05 ENCOUNTER — Encounter: Payer: Self-pay | Admitting: *Deleted

## 2014-03-23 ENCOUNTER — Ambulatory Visit (HOSPITAL_BASED_OUTPATIENT_CLINIC_OR_DEPARTMENT_OTHER): Payer: Medicare Other | Admitting: Oncology

## 2014-03-23 ENCOUNTER — Other Ambulatory Visit (HOSPITAL_BASED_OUTPATIENT_CLINIC_OR_DEPARTMENT_OTHER): Payer: Medicare Other

## 2014-03-23 ENCOUNTER — Telehealth: Payer: Self-pay | Admitting: Oncology

## 2014-03-23 VITALS — BP 160/68 | HR 71 | Temp 98.4°F | Resp 18 | Ht 62.0 in | Wt 165.1 lb

## 2014-03-23 DIAGNOSIS — Z992 Dependence on renal dialysis: Secondary | ICD-10-CM

## 2014-03-23 DIAGNOSIS — Z853 Personal history of malignant neoplasm of breast: Secondary | ICD-10-CM

## 2014-03-23 DIAGNOSIS — C50412 Malignant neoplasm of upper-outer quadrant of left female breast: Secondary | ICD-10-CM

## 2014-03-23 DIAGNOSIS — Z170 Estrogen receptor positive status [ER+]: Secondary | ICD-10-CM

## 2014-03-23 LAB — COMPREHENSIVE METABOLIC PANEL (CC13)
ALT: 11 U/L (ref 0–55)
AST: 15 U/L (ref 5–34)
Albumin: 3.6 g/dL (ref 3.5–5.0)
Alkaline Phosphatase: 83 U/L (ref 40–150)
Anion Gap: 13 mEq/L — ABNORMAL HIGH (ref 3–11)
BILIRUBIN TOTAL: 0.71 mg/dL (ref 0.20–1.20)
BUN: 13.3 mg/dL (ref 7.0–26.0)
CALCIUM: 9.2 mg/dL (ref 8.4–10.4)
CHLORIDE: 97 meq/L — AB (ref 98–109)
CO2: 27 mEq/L (ref 22–29)
Creatinine: 4.6 mg/dL (ref 0.6–1.1)
Glucose: 82 mg/dl (ref 70–140)
Potassium: 3.5 mEq/L (ref 3.5–5.1)
Sodium: 137 mEq/L (ref 136–145)
Total Protein: 7.6 g/dL (ref 6.4–8.3)

## 2014-03-23 LAB — CBC WITH DIFFERENTIAL/PLATELET
BASO%: 1.1 % (ref 0.0–2.0)
Basophils Absolute: 0.1 10*3/uL (ref 0.0–0.1)
EOS%: 3.8 % (ref 0.0–7.0)
Eosinophils Absolute: 0.2 10*3/uL (ref 0.0–0.5)
HEMATOCRIT: 35.2 % (ref 34.8–46.6)
HGB: 11.1 g/dL — ABNORMAL LOW (ref 11.6–15.9)
LYMPH#: 1.5 10*3/uL (ref 0.9–3.3)
LYMPH%: 25 % (ref 14.0–49.7)
MCH: 29.5 pg (ref 25.1–34.0)
MCHC: 31.6 g/dL (ref 31.5–36.0)
MCV: 93.4 fL (ref 79.5–101.0)
MONO#: 0.2 10*3/uL (ref 0.1–0.9)
MONO%: 3.8 % (ref 0.0–14.0)
NEUT#: 3.9 10*3/uL (ref 1.5–6.5)
NEUT%: 66.3 % (ref 38.4–76.8)
Platelets: 207 10*3/uL (ref 145–400)
RBC: 3.77 10*6/uL (ref 3.70–5.45)
RDW: 16 % — ABNORMAL HIGH (ref 11.2–14.5)
WBC: 5.9 10*3/uL (ref 3.9–10.3)

## 2014-03-23 NOTE — Progress Notes (Signed)
You will in her would like me to Dr. Dr. Iona Beard or you get a home ID: Pam Fuller OB: December 11, 1932  MR#: [id number]  NWG#:[id number]  PCP: Salena Saner., MD GYN:   SUFanny Skates OTHER MD: Donato Heinz  CHIEF COMPLAINT:estrogen receptor positive breast cancer  CURRENT TREATMENT: Tamoxifen   HISTORY OF PRESENT ILLNESS: From the earlier summary: Pam Fuller underwent left lumpectomy and sentinel lymph node sampling under her Dr. Kathrin Penner 05/31/1999 for a 9 mm invasive ductal carcinoma, grade 2, with 0 of 3 sentinel lymph nodes involved. The tumor was 97% estrogen receptor positive, 98% progesterone receptor positive, and had a proliferation marker of 10%. HER-2/neu was negative by the HercepTest. It was diploid. After completing adjuvant radiation treatment the patient took tamoxifen for 5 years, completed in the spring of 2006.  More recently, in 03/19/2013 the patient had bilateral screening mammography at [facility]. There was a focal asymmetry in the left breast at the 1:00 position which by ultrasound on the same day proved to be a 5 mm irregular mass. A 4 mm complex cyst was also noted in the left axillary tail, consistent with a sebaceous cyst.  Biopsy of the left breast mass in question 03/26/2013 showed an invasive ductal carcinoma, with lobular features, grade 1, estrogen receptor 100% positive, progesterone receptor 94% positive, with an MIB-1 of 8% and no HER-2 amplification.  The patient's subsequent history is as detailed below  INTERVAL HISTORY: Pam Fuller returns today for followup of her breast cancer. The interval history is unremarkable. She continues to receive hemodialysis Mondays Wednesdays and Fridays. On the other days she has frequent appointments with doctors including her ophthalmologist: She just had her left cataract removed and is seeing much better, she tells me. She calls hemodialysis "my job", which is a good way to think of it. She is tolerating with  no hot flashes or vaginal dryness that she is aware of. Recall she is status post hysterectomy  ROS:  She has some fatigue related to her hemodialysis, but aside from that there have been no intercurrent infections, no bleeding, no unusual headaches, no nausea or vomiting, no cough or phlegm production, no change in bowel habits, and in general a detailed review of systems today was entirely stable  PAST MEDICAL HISTORY: Past Medical History  Diagnosis Date  . Hypertension   . Arthritis   . Blood transfusion without reported diagnosis   . Renal disorder     patient on dialysis  . Cancer     patient states she had br ca 13 years ago  . ESRD (end stage renal disease) on dialysis   . Wears glasses     PAST SURGICAL HISTORY: Past Surgical History  Procedure Laterality Date  . Abdominal hysterectomy    . Breast lumpectomy with axillary lymph node biopsy  2001    left  . Appendectomy    . Tonsillectomy    . Colonoscopy    . Partial mastectomy with needle localization Left 06/18/2013    Procedure: PARTIAL MASTECTOMY WITH NEEDLE LOCALIZATION;  Surgeon: Adin Hector, MD;  Location: [facility] [facility];  Service: General;  Laterality: Left;  . Breast surgery      FAMILY HISTORY Family History  Problem Relation Age of Onset  . Cancer Maternal Aunt     breast   the patient's father died in his 30s from what appears to have been complications of atherosclerotic cardiovascular disease. The patient's mother died in her 78Q from complications of diabetes.  Pam Fuller has one adopted brother. She has 2 full sisters. There is no breast or ovarian cancer in the immediate family. One of the patient's mother is 3 sisters was diagnosed with breast cancer but the patient does not know at what age.  GYNECOLOGIC HISTORY:  Menarche age 80, first live birth age 80. She is GX P2. She underwent hysterectomy remotely, but does not know if her ovaries were removed. 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 She is a widow and lives with her son Pam Fuller, who is self-employed in Advertising copywriter. Son Pam Fuller is completing his PhD in business and health care. The patient has 3 grandchildren she attends a local [employer]: Not in place; the patient was given the appropriate documents on her 04/21/2013 visit to declare a healthcare power of attorney   HEALTH MAINTENANCE: History  Substance Use Topics  . Smoking status: Never Smoker   . Smokeless tobacco: Never Used  . Alcohol Use: No     Colonoscopy:  PAP:  Bone density:  Lipid panel:  Allergies  Allergen Reactions  . Erythromycin Nausea And Vomiting  . Vicodin [Hydrocodone-Acetaminophen]     unknown    Current Outpatient Prescriptions  Medication Sig Dispense Refill  . amLODipine (NORVASC) 10 MG tablet Take 10 mg by mouth daily.     . ciprofloxacin (CILOXAN) 0.3 % ophthalmic solution Place 2 drops into the left eye 4 (four) times daily. Place one drop in effected eye every 4 hours until follow up with opthalmologist 2.5 mL 1  . cloNIDine (CATAPRES) 0.1 MG tablet Take 0.1 mg by mouth 2 (two) times daily.     . colchicine 0.6 MG tablet Take 0.6 mg by mouth 2 (two) times daily.    . hydrALAZINE (APRESOLINE) 50 MG tablet Take 50 mg by mouth daily.     . tamoxifen (NOLVADEX) 20 MG tablet Take 1 tablet (20 mg total) by mouth daily. 90 tablet 12   No current facility-administered medications for this visit.    OBJECTIVE: [name] who appears stated age  80 Vitals:   03/23/14 1139  BP: 160/68  Pulse: 71  Temp: 98.4 F (36.9 C)  Resp: 18     Body mass index is 30.19 kg/(m^2).    ECOG FS:1 - Symptomatic but completely ambulatory  Ocular: Sclerae unicteric,EOMs intact Ear-nose-throat: Oropharynx  clear andslightly dry  Lymphatic: No cervical or supraclavicular  adenopathy Lungs no rales or rhonchi Heart regular rate and rhythm, 3/6 systolic murmur, unchanged Abd soft, nontender, positive bowel sounds, no masses palpated MSK no focal spinal tenderness, fistula and left upper extremity stable Neuro: non-focal, well-oriented,  positive  affect Breasts: The right breast is unremarkable. The left breast is darker and  smaller than the right. There is no no evidence of local recurrence. The left axilla is benign  LAB RESULTS:  CMP     Component Value Date/Time   NA 142 08/13/2013 1117   NA 139 06/17/2013 1430   K 3.6 08/13/2013 1117   K 4.3 06/17/2013 1430   CL 97 06/17/2013 1430   CO2 25 08/13/2013 1117   CO2 23 06/17/2013 1430   GLUCOSE 111 08/13/2013 1117   GLUCOSE 111* 06/17/2013 1430   BUN 24.3 08/13/2013 1117   BUN 33* 06/17/2013 1430   CREATININE 4.9* 08/13/2013 1117   CREATININE 6.78* 06/17/2013  1430   CALCIUM 10.2 08/13/2013 1117   CALCIUM 8.3* 06/17/2013 1430   CALCIUM 7.0* 04/03/2010 1225   PROT 7.8 08/13/2013 1117   PROT 7.7 06/17/2013 1430   ALBUMIN 3.7 08/13/2013 1117   ALBUMIN 3.6 06/17/2013 1430   AST 16 08/13/2013 1117   AST 19 06/17/2013 1430   ALT 13 08/13/2013 1117   ALT 18 06/17/2013 1430   ALKPHOS 130 08/13/2013 1117   ALKPHOS 117 06/17/2013 1430   BILITOT 0.65 08/13/2013 1117   BILITOT 0.5 06/17/2013 1430   GFRNONAA 5* 06/17/2013 1430   GFRAA 6* 06/17/2013 1430    I No results found for: SPEP  Lab Results  Component Value Date   WBC 5.9 03/23/2014   NEUTROABS 3.9 03/23/2014   HGB 11.1* 03/23/2014   HCT 35.2 03/23/2014   MCV 93.4 03/23/2014   PLT 207 03/23/2014      Chemistry      Component Value Date/Time   NA 142 08/13/2013 1117   NA 139 06/17/2013 1430   K 3.6 08/13/2013 1117   K 4.3 06/17/2013 1430   CL 97 06/17/2013 1430   CO2 25 08/13/2013 1117   CO2 23 06/17/2013 1430   BUN 24.3 08/13/2013 1117   BUN 33* 06/17/2013 1430   CREATININE 4.9* 08/13/2013 1117   CREATININE 6.78*  06/17/2013 1430      Component Value Date/Time   CALCIUM 10.2 08/13/2013 1117   CALCIUM 8.3* 06/17/2013 1430   CALCIUM 7.0* 04/03/2010 1225   ALKPHOS 130 08/13/2013 1117   ALKPHOS 117 06/17/2013 1430   AST 16 08/13/2013 1117   AST 19 06/17/2013 1430   ALT 13 08/13/2013 1117   ALT 18 06/17/2013 1430   BILITOT 0.65 08/13/2013 1117   BILITOT 0.5 06/17/2013 1430       No results found for: LABCA2  No components found for: LABCA125  No results for input(s): INR in the last 168 hours.  Urinalysis    Component Value Date/Time   COLORURINE YELLOW 03/31/2010 1319   APPEARANCEUR CLEAR 03/31/2010 1319   LABSPEC 1.013 03/31/2010 1319   PHURINE 6.0 03/31/2010 1319   GLUCOSEU 100* 03/31/2010 1319   HGBUR SMALL* 03/31/2010 1319   BILIRUBINUR NEGATIVE 03/31/2010 1319   KETONESUR NEGATIVE 03/31/2010 1319   PROTEINUR >300* 03/31/2010 1319   UROBILINOGEN 0.2 03/31/2010 1319   NITRITE NEGATIVE 03/31/2010 1319   LEUKOCYTESUR NEGATIVE 03/31/2010 1319    STUDIES: No results found.   ASSESSMENT: 80 y.o. [place] woman  (1) status post left lumpectomy and sentinel lymph node sampling January of 2001 for a pT1b pN0, stage IA invasive ductal carcinoma, grade 2, estrogen receptor 97% positive, progesterone receptor 90% positive, with an MIB-1 of 10%  (2) status post adjuvant radiation to the left breast  (3) status post tamoxifen given for 5 years, completed February of 2006  (4) status post left breast biopsy 03/26/2013 for a clinical T1a N0, stage IA invasive ductal carcinoma, with lobular features, grade 1, estrogen receptor 100% positive, progesterone receptor 94% positive, with an MIB-1 of 8% and no HER-2 amplification  (5) status post left lumpectomy 06/18/2013 for a pT1b NX, stage IA invasive ductal carcinoma, grade 1, repeat HER-2 testing again negative  (6) tamoxifen started March 2015   (7) status post remote hysterectomy  PLAN:  Calianne is tolerating tamoxifen with no  side effects that she is aware of. The plan is going to be to continue that for a total of 5 years.she will see [place] again in  4 months, after her next set of mammograms. We will start seeing her on a once a year basis after that.  Nianna has a good understanding of the overall plan. She agrees with it. She knows the goal of treatment in her case is cure.She will call with any problems that may develop before next visit. Chauncey Cruel, MD   03/23/2014 12:02 PM

## 2014-03-23 NOTE — Addendum Note (Signed)
Addended by: Laureen Abrahams on: 03/23/2014 05:27 PM   Modules accepted: Medications

## 2014-07-20 ENCOUNTER — Other Ambulatory Visit: Payer: Self-pay | Admitting: Nurse Practitioner

## 2014-07-20 ENCOUNTER — Other Ambulatory Visit: Payer: Medicare Other

## 2014-07-20 ENCOUNTER — Ambulatory Visit: Payer: Medicare Other | Admitting: Nurse Practitioner

## 2014-11-04 ENCOUNTER — Other Ambulatory Visit: Payer: Self-pay

## 2014-11-04 MED ORDER — TAMOXIFEN CITRATE 20 MG PO TABS: 20.0000 mg | ORAL_TABLET | Freq: Every day | ORAL | Status: DC

## 2014-12-09 ENCOUNTER — Other Ambulatory Visit: Payer: Self-pay | Admitting: *Deleted

## 2014-12-09 MED ORDER — TAMOXIFEN CITRATE 20 MG PO TABS: 20.0000 mg | ORAL_TABLET | Freq: Every day | ORAL | Status: DC

## 2014-12-09 NOTE — Telephone Encounter (Signed)
Received refill request for tamoxifen.  Pt last seen in Nov 2015 with next follow up in March of 2016 with pt as a no show.  Refill in June noted as 1 month only and request for pt to call to schedule appointment for follow up.  Refill will again be given for 1 month and POF sent to scheduling for this office to contact pt to scheduled follow up.

## 2014-12-10 ENCOUNTER — Telehealth: Payer: Self-pay | Admitting: Nurse Practitioner

## 2014-12-10 NOTE — Telephone Encounter (Signed)
Patient confirmed appointment for August. Patient needs a Tuesday or Thursday due to Dialysis. Mailed calendar

## 2015-01-06 ENCOUNTER — Ambulatory Visit (HOSPITAL_BASED_OUTPATIENT_CLINIC_OR_DEPARTMENT_OTHER): Payer: Medicare Other | Admitting: Nurse Practitioner

## 2015-01-06 ENCOUNTER — Other Ambulatory Visit: Payer: Self-pay | Admitting: *Deleted

## 2015-01-06 ENCOUNTER — Telehealth: Payer: Self-pay | Admitting: Nurse Practitioner

## 2015-01-06 ENCOUNTER — Encounter: Payer: Self-pay | Admitting: Nurse Practitioner

## 2015-01-06 ENCOUNTER — Other Ambulatory Visit (HOSPITAL_BASED_OUTPATIENT_CLINIC_OR_DEPARTMENT_OTHER): Payer: Medicare Other

## 2015-01-06 VITALS — BP 150/66 | HR 63 | Temp 98.6°F | Resp 17 | Ht 62.0 in | Wt 153.0 lb

## 2015-01-06 DIAGNOSIS — C50412 Malignant neoplasm of upper-outer quadrant of left female breast: Secondary | ICD-10-CM

## 2015-01-06 DIAGNOSIS — Z170 Estrogen receptor positive status [ER+]: Secondary | ICD-10-CM | POA: Diagnosis not present

## 2015-01-06 DIAGNOSIS — Z79810 Long term (current) use of selective estrogen receptor modulators (SERMs): Secondary | ICD-10-CM

## 2015-01-06 LAB — CBC WITH DIFFERENTIAL/PLATELET
BASO%: 0.4 % (ref 0.0–2.0)
Basophils Absolute: 0 10*3/uL (ref 0.0–0.1)
EOS%: 4.4 % (ref 0.0–7.0)
Eosinophils Absolute: 0.3 10*3/uL (ref 0.0–0.5)
HCT: 31.6 % — ABNORMAL LOW (ref 34.8–46.6)
HGB: 9.9 g/dL — ABNORMAL LOW (ref 11.6–15.9)
LYMPH%: 29.3 % (ref 14.0–49.7)
MCH: 30.1 pg (ref 25.1–34.0)
MCHC: 31.3 g/dL — ABNORMAL LOW (ref 31.5–36.0)
MCV: 96 fL (ref 79.5–101.0)
MONO#: 0.4 10*3/uL (ref 0.1–0.9)
MONO%: 6.2 % (ref 0.0–14.0)
NEUT#: 3.4 10*3/uL (ref 1.5–6.5)
NEUT%: 59.7 % (ref 38.4–76.8)
Platelets: 205 10*3/uL (ref 145–400)
RBC: 3.29 10*6/uL — AB (ref 3.70–5.45)
RDW: 17.5 % — ABNORMAL HIGH (ref 11.2–14.5)
WBC: 5.6 10*3/uL (ref 3.9–10.3)
lymph#: 1.7 10*3/uL (ref 0.9–3.3)
nRBC: 0 % (ref 0–0)

## 2015-01-06 MED ORDER — TAMOXIFEN CITRATE 20 MG PO TABS: 20.0000 mg | ORAL_TABLET | Freq: Every day | ORAL | Status: DC

## 2015-01-06 NOTE — Telephone Encounter (Signed)
Appointments made and avs printed for patient °

## 2015-01-06 NOTE — Progress Notes (Signed)
You will in her would like me to Dr. Dr. Iona Fuller or you get a home ID: Pam Fuller OB: [birth year]  MR#: [id number]  HTX#:[id number]  PCP: Pam Fuller., MD GYN:   SUFanny Fuller OTHER MD: Pam Fuller  CHIEF COMPLAINT:estrogen receptor positive breast cancer  CURRENT TREATMENT: Tamoxifen  BREAST CANCER HISTORY: From the earlier summary: Pam Fuller underwent left lumpectomy and sentinel lymph node sampling under her Dr. Kathrin Fuller 05/31/1999 for a 9 mm invasive ductal carcinoma, grade 2, with 0 of 3 sentinel lymph nodes involved. The tumor was 97% estrogen receptor positive, 98% progesterone receptor positive, and had a proliferation marker of 10%. HER-2/neu was negative by the HercepTest. It was diploid. After completing adjuvant radiation treatment the patient took tamoxifen for 5 years, completed in the spring of 2006.  More recently, in 03/19/2013 the patient had bilateral screening mammography at [facility] There was a focal asymmetry in the left breast at the 1:00 position which by ultrasound on the same day proved to be a 5 mm irregular mass. A 4 mm complex cyst was also noted in the left axillary tail, consistent with a sebaceous cyst.  Biopsy of the left breast mass in question 03/26/2013 showed an invasive ductal carcinoma, with lobular features, grade 1, estrogen receptor 100% positive, progesterone receptor 94% positive, with an MIB-1 of 8% and no HER-2 amplification.  The patient's subsequent history is as detailed below  INTERVAL HISTORY: Pam Fuller returns today for follow up of her breast cancer. She has been on tamoxifen since March 2015 and is tolerating to well. She has rare hot flashes, and denies vaginal changes. The interval history remarkable for cataract surgery to her left eye. This well with with no complications.   ROS:  Pam Fuller denies fevers, chills, nausea, or changes in bowel habits. She continues on hemodialysis MWF and denies recurrent infections  or bleeding. She has occasional headaches when her blood pressure is too high. She eats about 2 meals daily. She has fatigue after hemodialysis. She denies shortness of breath, chest pain, cough, or palpitations. She has chronic arthritis, but this has been no worse since her last visit. A detailed review of systems is otherwise stable.   PAST MEDICAL HISTORY: Past Medical History  Diagnosis Date  . Hypertension   . Arthritis   . Blood transfusion without reported diagnosis   . Renal disorder     patient on dialysis  . Cancer     patient states she had br ca 13 years ago  . ESRD (end stage renal disease) on dialysis   . Wears glasses     PAST SURGICAL HISTORY: Past Surgical History  Procedure Laterality Date  . Abdominal hysterectomy    . Breast lumpectomy with axillary lymph node biopsy  2001    left  . Appendectomy    . Tonsillectomy    . Colonoscopy    . Partial mastectomy with needle localization Left 06/18/2013    Procedure: PARTIAL MASTECTOMY WITH NEEDLE LOCALIZATION;  Surgeon: Pam Hector, MD;  Location: [facility];  Service: General;  Laterality: Left;  . Breast surgery      FAMILY HISTORY Family History  Problem Relation Age of Onset  . Cancer Maternal Aunt     breast   the patient's father died in his 52s from what appears to have been complications of atherosclerotic cardiovascular disease. The patient's mother died in her 32Y from complications of diabetes. [name] has one adopted brother. She has 2 full sisters. There  is no breast or ovarian cancer in the immediate family. One of the patient's mother is 3 sisters was diagnosed with breast cancer but the patient does not know at what age.  GYNECOLOGIC HISTORY:  Menarche age 89, first live birth age 6. She is GX P2. She underwent hysterectomy remotely, but does not know if her ovaries were removed. 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 She is a widow and lives with her son Pam Fuller, who is self-employed in Advertising copywriter. Son Pam Fuller is completing his PhD in business and health care. The patient has 3 grandchildren she attends a local Pam Fuller: Not in place; the patient was given the appropriate documents on her 04/21/2013 visit to declare a healthcare power of attorney   HEALTH MAINTENANCE: Social History  Substance Use Topics  . Smoking status: Never Smoker   . Smokeless tobacco: Never Used  . Alcohol Use: No     Colonoscopy:  PAP:  Bone density:  Lipid panel:  Allergies  Allergen Reactions  . Erythromycin Nausea And Vomiting  . Vicodin [Hydrocodone-Acetaminophen]     unknown    Current Outpatient Prescriptions  Medication Sig Dispense Refill  . amLODipine (NORVASC) 10 MG tablet Take 10 mg by mouth daily.     . cloNIDine (CATAPRES) 0.1 MG tablet Take 0.1 mg by mouth 2 (two) times daily.     . hydrALAZINE (APRESOLINE) 50 MG tablet Take 50 mg by mouth daily.     [name] PROLENSA 0.07 % SOLN Place 1 drop into the left eye daily.   0  . tamoxifen (NOLVADEX) 20 MG tablet Take 1 tablet (20 mg total) by mouth daily. 30 tablet 0  . colchicine 0.6 MG tablet Take 0.6 mg by mouth 2 (two) times daily.     No current facility-administered medications for this visit.    OBJECTIVE: [name] who appears stated age  81 Vitals:   01/06/15 1312  BP: 150/66  Pulse: 63  Temp: 98.6 F (37 C)  Resp: 17     Body mass index is 27.98 kg/(m^2).    ECOG FS:1 - Symptomatic but completely ambulatory  Skin: warm, dry  HEENT: sclerae anicteric, conjunctivae pink, oropharynx clear. No thrush or mucositis.  Lymph Nodes: No cervical or supraclavicular lymphadenopathy  Lungs: clear to auscultation bilaterally, no rales, wheezes, or rhonci  Heart: regular rate and rhythm, soft systolic murmur noted, left forearm AV graft Abdomen:  round, soft, non tender, positive bowel sounds  Musculoskeletal: No focal spinal tenderness, no peripheral edema  Neuro: non focal, well oriented, positive affect  Breasts: left breast status post lumpectomy and radiation. Residual hyperpigmentation noted. No evidence of recurrent disease. Left axilla benign. Right breast unremarkable.  LAB RESULTS:  CMP     Component Value Date/Time   NA 137 03/23/2014 1122   NA 139 06/17/2013 1430   K 3.5 03/23/2014 1122   K 4.3 06/17/2013 1430   CL 97 06/17/2013 1430   CO2 27 03/23/2014 1122   CO2 23 06/17/2013 1430   GLUCOSE 82 03/23/2014 1122   GLUCOSE 111* 06/17/2013 1430   BUN 13.3 03/23/2014 1122   BUN 33* 06/17/2013 1430   CREATININE 4.6* 03/23/2014 1122   CREATININE 6.78* 06/17/2013 1430   CALCIUM 9.2 03/23/2014 1122   CALCIUM 8.3* 06/17/2013 1430   CALCIUM 7.0* 04/03/2010 1225  PROT 7.6 03/23/2014 1122   PROT 7.7 06/17/2013 1430   ALBUMIN 3.6 03/23/2014 1122   ALBUMIN 3.6 06/17/2013 1430   AST 15 03/23/2014 1122   AST 19 06/17/2013 1430   ALT 11 03/23/2014 1122   ALT 18 06/17/2013 1430   ALKPHOS 83 03/23/2014 1122   ALKPHOS 117 06/17/2013 1430   BILITOT 0.71 03/23/2014 1122   BILITOT 0.5 06/17/2013 1430   GFRNONAA 5* 06/17/2013 1430   GFRAA 6* 06/17/2013 1430    I No results found for: SPEP  Lab Results  Component Value Date   WBC 5.6 01/06/2015   NEUTROABS 3.4 01/06/2015   HGB 9.9* 01/06/2015   HCT 31.6* 01/06/2015   MCV 96.0 01/06/2015   PLT 205 01/06/2015      Chemistry      Component Value Date/Time   NA 137 03/23/2014 1122   NA 139 06/17/2013 1430   K 3.5 03/23/2014 1122   K 4.3 06/17/2013 1430   CL 97 06/17/2013 1430   CO2 27 03/23/2014 1122   CO2 23 06/17/2013 1430   BUN 13.3 03/23/2014 1122   BUN 33* 06/17/2013 1430   CREATININE 4.6* 03/23/2014 1122   CREATININE 6.78* 06/17/2013 1430      Component Value Date/Time   CALCIUM 9.2 03/23/2014 1122   CALCIUM 8.3* 06/17/2013 1430   CALCIUM 7.0*  04/03/2010 1225   ALKPHOS 83 03/23/2014 1122   ALKPHOS 117 06/17/2013 1430   AST 15 03/23/2014 1122   AST 19 06/17/2013 1430   ALT 11 03/23/2014 1122   ALT 18 06/17/2013 1430   BILITOT 0.71 03/23/2014 1122   BILITOT 0.5 06/17/2013 1430       No results found for: LABCA2  No components found for: JDBZM080  No results for input(s): INR in the last 168 hours.  Urinalysis    Component Value Date/Time   COLORURINE YELLOW 03/31/2010 1319   APPEARANCEUR CLEAR 03/31/2010 1319   LABSPEC 1.013 03/31/2010 1319   PHURINE 6.0 03/31/2010 1319   GLUCOSEU 100* 03/31/2010 1319   HGBUR SMALL* 03/31/2010 1319   BILIRUBINUR NEGATIVE 03/31/2010 1319   KETONESUR NEGATIVE 03/31/2010 1319   PROTEINUR >300* 03/31/2010 1319   UROBILINOGEN 0.2 03/31/2010 1319   NITRITE NEGATIVE 03/31/2010 1319   LEUKOCYTESUR NEGATIVE 03/31/2010 1319    STUDIES: No results found.   ASSESSMENT: 81 y.o.  woman  (1) status post left lumpectomy and sentinel lymph node sampling January of 2001 for a pT1b pN0, stage IA invasive ductal carcinoma, grade 2, estrogen receptor 97% positive, progesterone receptor 90% positive, with an MIB-1 of 10%  (2) status post adjuvant radiation to the left breast  (3) status post tamoxifen given for 5 years, completed February of 2006  (4) status post left breast biopsy 03/26/2013 for a clinical T1a N0, stage IA invasive ductal carcinoma, with lobular features, grade 1, estrogen receptor 100% positive, progesterone receptor 94% positive, with an MIB-1 of 8% and no HER-2 amplification  (5) status post left lumpectomy 06/18/2013 for a pT1b NX, stage IA invasive ductal carcinoma, grade 1, repeat HER-2 testing again negative  (6) tamoxifen started March 2015   (7) status post remote hysterectomy  PLAN: Wetona is doing well as far as her breast cancer is concerned. She is now 1.5 years out from her most recent definitive surgery with no evidence of recurrent disease. The  CBC was reviewed in detail and besides anemia, was stable. Her dialysis center manages her anemia of chronic disease. She is tolerating the tamoxifen  well, and will continue this for 5 years of antiestrogen therapy.   Jaslyne will return in 6 months for labs and a follow up visit. She understands and agrees with this plan. She knows the goal of treatment in her case is cure. She has been encouraged to call with any issues that might arise before her next visit here.  Laurie Panda, NP   01/06/2015 1:37 PM

## 2015-03-17 ENCOUNTER — Encounter (INDEPENDENT_AMBULATORY_CARE_PROVIDER_SITE_OTHER): Payer: Self-pay | Admitting: Ophthalmology

## 2015-06-30 ENCOUNTER — Encounter (INDEPENDENT_AMBULATORY_CARE_PROVIDER_SITE_OTHER): Payer: Medicare Other | Admitting: Ophthalmology

## 2015-06-30 DIAGNOSIS — H35033 Hypertensive retinopathy, bilateral: Secondary | ICD-10-CM | POA: Diagnosis not present

## 2015-06-30 DIAGNOSIS — H43813 Vitreous degeneration, bilateral: Secondary | ICD-10-CM

## 2015-06-30 DIAGNOSIS — H34833 Tributary (branch) retinal vein occlusion, bilateral, with macular edema: Secondary | ICD-10-CM | POA: Diagnosis not present

## 2015-06-30 DIAGNOSIS — I10 Essential (primary) hypertension: Secondary | ICD-10-CM | POA: Diagnosis not present

## 2015-07-04 ENCOUNTER — Other Ambulatory Visit: Payer: Self-pay

## 2015-07-04 DIAGNOSIS — C50412 Malignant neoplasm of upper-outer quadrant of left female breast: Secondary | ICD-10-CM

## 2015-07-05 ENCOUNTER — Ambulatory Visit: Payer: Medicare Other | Admitting: Oncology

## 2015-07-05 ENCOUNTER — Other Ambulatory Visit: Payer: Medicare Other

## 2015-07-05 NOTE — Progress Notes (Signed)
no show

## 2015-07-21 ENCOUNTER — Encounter (INDEPENDENT_AMBULATORY_CARE_PROVIDER_SITE_OTHER): Payer: Medicare Other | Admitting: Ophthalmology

## 2015-07-21 DIAGNOSIS — H34832 Tributary (branch) retinal vein occlusion, left eye, with macular edema: Secondary | ICD-10-CM

## 2015-08-11 ENCOUNTER — Other Ambulatory Visit: Payer: Self-pay

## 2015-08-11 DIAGNOSIS — C50412 Malignant neoplasm of upper-outer quadrant of left female breast: Secondary | ICD-10-CM

## 2015-08-11 MED ORDER — TAMOXIFEN CITRATE 20 MG PO TABS: 20.0000 mg | ORAL_TABLET | Freq: Every day | ORAL | Status: DC

## 2015-09-11 ENCOUNTER — Emergency Department (HOSPITAL_BASED_OUTPATIENT_CLINIC_OR_DEPARTMENT_OTHER)
Admission: EM | Admit: 2015-09-11 | Discharge: 2015-09-11 | Disposition: A | Discharge status: Home / Self Care | Payer: Medicare Other | Attending: Emergency Medicine | Admitting: Emergency Medicine

## 2015-09-11 ENCOUNTER — Emergency Department (HOSPITAL_BASED_OUTPATIENT_CLINIC_OR_DEPARTMENT_OTHER): Payer: Medicare Other

## 2015-09-11 ENCOUNTER — Encounter (HOSPITAL_BASED_OUTPATIENT_CLINIC_OR_DEPARTMENT_OTHER): Payer: Self-pay | Admitting: *Deleted

## 2015-09-11 DIAGNOSIS — R05 Cough: Secondary | ICD-10-CM | POA: Diagnosis present

## 2015-09-11 DIAGNOSIS — I120 Hypertensive chronic kidney disease with stage 5 chronic kidney disease or end stage renal disease: Secondary | ICD-10-CM | POA: Diagnosis not present

## 2015-09-11 DIAGNOSIS — N186 End stage renal disease: Secondary | ICD-10-CM | POA: Insufficient documentation

## 2015-09-11 DIAGNOSIS — Z8739 Personal history of other diseases of the musculoskeletal system and connective tissue: Secondary | ICD-10-CM | POA: Diagnosis not present

## 2015-09-11 DIAGNOSIS — Z992 Dependence on renal dialysis: Secondary | ICD-10-CM | POA: Diagnosis not present

## 2015-09-11 DIAGNOSIS — J069 Acute upper respiratory infection, unspecified: Secondary | ICD-10-CM | POA: Insufficient documentation

## 2015-09-11 DIAGNOSIS — Z853 Personal history of malignant neoplasm of breast: Secondary | ICD-10-CM | POA: Insufficient documentation

## 2015-09-11 DIAGNOSIS — Z79899 Other long term (current) drug therapy: Secondary | ICD-10-CM | POA: Diagnosis not present

## 2015-09-11 MED ORDER — GUAIFENESIN-DM 100-10 MG/5ML PO SYRP: 5.0000 mL | ORAL_SOLUTION | Freq: Three times a day (TID) | ORAL | Status: DC | PRN

## 2015-09-11 NOTE — Discharge Instructions (Signed)
Upper Respiratory Infection, Adult Most upper respiratory infections (URIs) are a viral infection of the air passages leading to the lungs. A URI affects the nose, throat, and upper air passages. The most common type of URI is nasopharyngitis and is typically referred to as "the common cold." URIs run their course and usually go away on their own. Most of the time, a URI does not require medical attention, but sometimes a bacterial infection in the upper airways can follow a viral infection. This is called a secondary infection. Sinus and middle ear infections are common types of secondary upper respiratory infections. Bacterial pneumonia can also complicate a URI. A URI can worsen asthma and chronic obstructive pulmonary disease (COPD). Sometimes, these complications can require emergency medical care and may be life threatening.  CAUSES Almost all URIs are caused by viruses. A virus is a type of germ and can spread from one person to another.  RISKS FACTORS You may be at risk for a URI if:   You smoke.   You have chronic heart or lung disease.  You have a weakened defense (immune) system.   You are very young or very old.   You have nasal allergies or asthma.  You work in crowded or poorly ventilated areas.  You work in health care facilities or schools. SIGNS AND SYMPTOMS  Symptoms typically develop 2-3 days after you come in contact with a cold virus. Most viral URIs last 7-10 days. However, viral URIs from the influenza virus (flu virus) can last 14-18 days and are typically more severe. Symptoms may include:   Runny or stuffy (congested) nose.   Sneezing.   Cough.   Sore throat.   Headache.   Fatigue.   Fever.   Loss of appetite.   Pain in your forehead, behind your eyes, and over your cheekbones (sinus pain).  Muscle aches.  DIAGNOSIS  Your health care provider may diagnose a URI by:  Physical exam.  Tests to check that your symptoms are not due to  another condition such as:  Strep throat.  Sinusitis.  Pneumonia.  Asthma. TREATMENT  A URI goes away on its own with time. It cannot be cured with medicines, but medicines may be prescribed or recommended to relieve symptoms. Medicines may help:  Reduce your fever.  Reduce your cough.  Relieve nasal congestion. HOME CARE INSTRUCTIONS   Take medicines only as directed by your health care provider.   Gargle warm saltwater or take cough drops to comfort your throat as directed by your health care provider.  Use a warm mist humidifier or inhale steam from a shower to increase air moisture. This may make it easier to breathe.  Drink enough fluid to keep your urine clear or pale yellow.   Eat soups and other clear broths and maintain good nutrition.   Rest as needed.   Return to work when your temperature has returned to normal or as your health care provider advises. You may need to stay home longer to avoid infecting others. You can also use a face mask and careful hand washing to prevent spread of the virus.  Increase the usage of your inhaler if you have asthma.   Do not use any tobacco products, including cigarettes, chewing tobacco, or electronic cigarettes. If you need help quitting, ask your health care provider. PREVENTION  The best way to protect yourself from getting a cold is to practice good hygiene.   Avoid oral or hand contact with people with cold   symptoms.   Wash your hands often if contact occurs.  There is no clear evidence that vitamin C, vitamin E, echinacea, or exercise reduces the chance of developing a cold. However, it is always recommended to get plenty of rest, exercise, and practice good nutrition.  SEEK MEDICAL CARE IF:   You are getting worse rather than better.   Your symptoms are not controlled by medicine.   You have chills.  You have worsening shortness of breath.  You have brown or red mucus.  You have yellow or brown nasal  discharge.  You have pain in your face, especially when you bend forward.  You have a fever.  You have swollen neck glands.  You have pain while swallowing.  You have white areas in the back of your throat. SEEK IMMEDIATE MEDICAL CARE IF:   You have severe or persistent:  Headache.  Ear pain.  Sinus pain.  Chest pain.  You have chronic lung disease and any of the following:  Wheezing.  Prolonged cough.  Coughing up blood.  A change in your usual mucus.  You have a stiff neck.  You have changes in your:  Vision.  Hearing.  Thinking.  Mood. MAKE SURE YOU:   Understand these instructions.  Will watch your condition.  Will get help right away if you are not doing well or get worse.   This information is not intended to replace advice given to you by your health care provider. Make sure you discuss any questions you have with your health care provider.   Document Released: 10/31/2000 Document Revised: 09/21/2014 Document Reviewed: 08/12/2013 Elsevier Interactive Patient Education 2016 [employer] Inc.  

## 2015-09-11 NOTE — ED Provider Notes (Signed)
CSN: [id number]     Arrival date & time 09/11/15  1236 History   First MD Initiated Contact with Patient 09/11/15 1349     Chief Complaint  Patient presents with  . URI      Patient is a 82 y.o. female presenting with URI. The history is provided by the patient.  URI Presenting symptoms: congestion and cough   Associated symptoms: no headaches    patient presents with shortness of breath and cough. States began a few days ago. States she is coughing and bringing up sputum. States that it began with yellow sputum and now it is cleared up somewhat. States she has had to mouth breathe. She is a dialysis patient and was last dialyzed 2 days ago as normal. Some chills without frank fever. No nausea vomiting or diarrhea.  No known lung diseases. She does not smoke.  Past Medical History  Diagnosis Date  . Hypertension   . Arthritis   . Blood transfusion without reported diagnosis   . Renal disorder     patient on dialysis  . Cancer [facility])     patient states she had br ca 13 years ago  . ESRD (end stage renal disease) on dialysis ([place])   . Wears glasses    Past Surgical History  Procedure Laterality Date  . Abdominal hysterectomy    . Breast lumpectomy with axillary lymph node biopsy  2001    left  . Appendectomy    . Tonsillectomy    . Colonoscopy    . Partial mastectomy with needle localization Left 06/18/2013    Procedure: PARTIAL MASTECTOMY WITH NEEDLE LOCALIZATION;  Surgeon: Adin Hector, MD;  Location: [place];  Service: General;  Laterality: Left;  . Breast surgery     Family History  Problem Relation Age of Onset  . Cancer Maternal Aunt     breast   Social History  Substance Use Topics  . Smoking status: Never Smoker   . Smokeless tobacco: Never Used  . Alcohol Use: No   OB History    No data available     Review of Systems  Constitutional: Negative for activity change and appetite change.  HENT: Positive for congestion.   Eyes: Negative  for pain.  Respiratory: Positive for cough and shortness of breath. Negative for chest tightness.   Cardiovascular: Negative for chest pain and leg swelling.  Gastrointestinal: Negative for nausea, vomiting, abdominal pain and diarrhea.  Genitourinary: Negative for flank pain.  Musculoskeletal: Negative for back pain and neck stiffness.  Skin: Negative for rash.  Neurological: Negative for weakness, numbness and headaches.  Psychiatric/Behavioral: Negative for behavioral problems.      Allergies  Erythromycin and Vicodin  Home Medications   Prior to Admission medications   Medication Sig Start Date End Date Taking? Authorizing Provider  amLODipine (NORVASC) 10 MG tablet Take 10 mg by mouth daily.  03/31/13   Historical Provider, MD  cloNIDine (CATAPRES) 0.1 MG tablet Take 0.1 mg by mouth 2 (two) times daily.  03/24/13   Historical Provider, MD  colchicine 0.6 MG tablet Take 0.6 mg by mouth 2 (two) times daily.    Historical Provider, MD  guaiFENesin-dextromethorphan (ROBITUSSIN DM) 100-10 MG/5ML syrup Take 5 mLs by mouth 3 (three) times daily as needed for cough. 09/11/15   Davonna Belling, MD  hydrALAZINE (APRESOLINE) 50 MG tablet Take 50 mg by mouth daily.  04/14/13   Historical Provider, MD  PROLENSA 0.07 % SOLN Place 1 drop into  the left eye daily.  12/28/14   Historical Provider, MD  tamoxifen (NOLVADEX) 20 MG tablet Take 1 tablet (20 mg total) by mouth daily. 08/11/15   Chauncey Cruel, MD   BP 193/77 mmHg  Pulse 66  Temp(Src) 98.2 F (36.8 C) (Oral)  Resp 16  Ht 5\' 2"  (1.575 m)  Wt 165 lb (74.844 kg)  BMI 30.17 kg/m2  SpO2 100% Physical Exam  Constitutional: She is oriented to person, place, and time. She appears well-developed and well-nourished.  HENT:  Head: Normocephalic and atraumatic.  Nasal congestion  Eyes: Pupils are equal, round, and reactive to light.  Neck: Normal range of motion.  Cardiovascular: Normal rate, regular rhythm and normal heart sounds.   No  murmur heard. Pulmonary/Chest: Effort normal and breath sounds normal. No respiratory distress. She has no wheezes. She has no rales.  Abdominal: Soft. Bowel sounds are normal. She exhibits no distension. There is no tenderness.  Musculoskeletal: Normal range of motion.  Neurological: She is alert and oriented to person, place, and time.  Skin: Skin is warm and dry.  Psychiatric: Her speech is normal.  Nursing note and vitals reviewed.   ED Course  Procedures (including critical care time) Labs Review Labs Reviewed - No data to display  Imaging Review Dg Chest 2 View  09/11/2015  CLINICAL DATA:  Productive cough EXAM: CHEST  2 VIEW COMPARISON:  04/10/2010 chest radiograph. FINDINGS: Surgical clips overlie the left lower chest. Stable cardiomediastinal silhouette with normal heart size. No pneumothorax. No pleural effusion. Lungs appear clear, with no acute consolidative airspace disease and no pulmonary edema. IMPRESSION: No active cardiopulmonary disease. Electronically Signed   By: Ilona Sorrel M.D.   On: 09/11/2015 14:01   I have personally reviewed and evaluated these images and lab results as part of my medical decision-making.   EKG Interpretation None      MDM   Final diagnoses:  URI (upper respiratory infection)  ESRD on dialysis [facility])     patient resents with URI symptoms and cough. No focal pneumonia either on exam or on x-ray. Not hypoxic with ambulation. Will discharge home with antitussives will follow-up as needed.    Davonna Belling, MD 09/11/15 [phone]

## 2015-09-11 NOTE — ED Notes (Signed)
Patient walked a lap around department. SAT 98% entire time. MD aware. RT to monitor.

## 2015-09-11 NOTE — ED Notes (Signed)
Having prod cough, thick yellow sputum.

## 2015-09-11 NOTE — ED Notes (Signed)
DC instructions and Rx by EDP reviewed and discussed with pt. Son here with pt. Opportunity for questions provided

## 2015-09-11 NOTE — ED Notes (Signed)
Pt reports nasal congestion, productive cough x 4-5 days.  Denies fever.

## 2015-09-13 ENCOUNTER — Emergency Department (HOSPITAL_COMMUNITY): Payer: No Typology Code available for payment source

## 2015-09-13 ENCOUNTER — Observation Stay (HOSPITAL_COMMUNITY)
Admission: EM | Admit: 2015-09-13 | Discharge: 2015-09-15 | Disposition: A | Discharge status: Home / Self Care | Payer: No Typology Code available for payment source | Attending: Internal Medicine | Admitting: Internal Medicine

## 2015-09-13 ENCOUNTER — Encounter (HOSPITAL_COMMUNITY): Payer: Self-pay

## 2015-09-13 DIAGNOSIS — I10 Essential (primary) hypertension: Secondary | ICD-10-CM | POA: Insufficient documentation

## 2015-09-13 DIAGNOSIS — S3992XA Unspecified injury of lower back, initial encounter: Principal | ICD-10-CM | POA: Insufficient documentation

## 2015-09-13 DIAGNOSIS — N186 End stage renal disease: Secondary | ICD-10-CM | POA: Diagnosis not present

## 2015-09-13 DIAGNOSIS — G8929 Other chronic pain: Secondary | ICD-10-CM | POA: Diagnosis not present

## 2015-09-13 DIAGNOSIS — Y998 Other external cause status: Secondary | ICD-10-CM | POA: Insufficient documentation

## 2015-09-13 DIAGNOSIS — R55 Syncope and collapse: Secondary | ICD-10-CM | POA: Diagnosis present

## 2015-09-13 DIAGNOSIS — Z973 Presence of spectacles and contact lenses: Secondary | ICD-10-CM | POA: Diagnosis not present

## 2015-09-13 DIAGNOSIS — Z79899 Other long term (current) drug therapy: Secondary | ICD-10-CM | POA: Diagnosis not present

## 2015-09-13 DIAGNOSIS — Z859 Personal history of malignant neoplasm, unspecified: Secondary | ICD-10-CM | POA: Diagnosis not present

## 2015-09-13 DIAGNOSIS — Y9389 Activity, other specified: Secondary | ICD-10-CM | POA: Diagnosis not present

## 2015-09-13 DIAGNOSIS — Z992 Dependence on renal dialysis: Secondary | ICD-10-CM | POA: Diagnosis not present

## 2015-09-13 DIAGNOSIS — Y92410 Unspecified street and highway as the place of occurrence of the external cause: Secondary | ICD-10-CM | POA: Diagnosis not present

## 2015-09-13 DIAGNOSIS — M1990 Unspecified osteoarthritis, unspecified site: Secondary | ICD-10-CM | POA: Insufficient documentation

## 2015-09-13 DIAGNOSIS — E875 Hyperkalemia: Secondary | ICD-10-CM | POA: Insufficient documentation

## 2015-09-13 DIAGNOSIS — I120 Hypertensive chronic kidney disease with stage 5 chronic kidney disease or end stage renal disease: Secondary | ICD-10-CM | POA: Insufficient documentation

## 2015-09-13 LAB — BASIC METABOLIC PANEL
Anion gap: 16 — ABNORMAL HIGH (ref 5–15)
BUN: 35 mg/dL — ABNORMAL HIGH (ref 6–20)
CALCIUM: 9.1 mg/dL (ref 8.9–10.3)
CO2: 27 mmol/L (ref 22–32)
CREATININE: 6.63 mg/dL — AB (ref 0.44–1.00)
Chloride: 97 mmol/L — ABNORMAL LOW (ref 101–111)
GFR calc non Af Amer: 5 mL/min — ABNORMAL LOW (ref >60)
GFR, EST AFRICAN AMERICAN: 6 mL/min — AB (ref >60)
GLUCOSE: 95 mg/dL (ref 65–99)
Potassium: 5.6 mmol/L — ABNORMAL HIGH (ref 3.5–5.1)
Sodium: 140 mmol/L (ref 135–145)

## 2015-09-13 LAB — CBC
HCT: 40.1 % (ref 36.0–46.0)
Hemoglobin: 12.6 g/dL (ref 12.0–15.0)
MCH: 28.8 pg (ref 26.0–34.0)
MCHC: 31.4 g/dL (ref 30.0–36.0)
MCV: 91.8 fL (ref 78.0–100.0)
PLATELETS: 202 10*3/uL (ref 150–400)
RBC: 4.37 MIL/uL (ref 3.87–5.11)
RDW: 15.8 % — AB (ref 11.5–15.5)
WBC: 7.9 10*3/uL (ref 4.0–10.5)

## 2015-09-13 LAB — MAGNESIUM: Magnesium: 2.4 mg/dL (ref 1.7–2.4)

## 2015-09-13 LAB — CBG MONITORING, ED: GLUCOSE-CAPILLARY: 81 mg/dL (ref 65–99)

## 2015-09-13 MED ORDER — FENTANYL CITRATE (PF) 100 MCG/2ML IJ SOLN
50.0000 ug | Freq: Once | INTRAMUSCULAR | Status: AC
  Administered 2015-09-13: 50 ug via INTRAVENOUS
  Filled 2015-09-13: qty 2
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
## 2015-09-14 ENCOUNTER — Observation Stay (HOSPITAL_BASED_OUTPATIENT_CLINIC_OR_DEPARTMENT_OTHER): Payer: No Typology Code available for payment source

## 2015-09-14 DIAGNOSIS — N186 End stage renal disease: Secondary | ICD-10-CM | POA: Diagnosis not present

## 2015-09-14 DIAGNOSIS — R55 Syncope and collapse: Secondary | ICD-10-CM

## 2015-09-14 DIAGNOSIS — C50919 Malignant neoplasm of unspecified site of unspecified female breast: Secondary | ICD-10-CM

## 2015-09-14 DIAGNOSIS — I10 Essential (primary) hypertension: Secondary | ICD-10-CM | POA: Diagnosis not present

## 2015-09-14 DIAGNOSIS — E875 Hyperkalemia: Secondary | ICD-10-CM | POA: Diagnosis not present

## 2015-09-14 DIAGNOSIS — S3992XA Unspecified injury of lower back, initial encounter: Secondary | ICD-10-CM | POA: Diagnosis not present

## 2015-09-14 DIAGNOSIS — M545 Low back pain: Secondary | ICD-10-CM

## 2015-09-14 LAB — CBC
HCT: 33.4 % — ABNORMAL LOW (ref 36.0–46.0)
Hemoglobin: 10.7 g/dL — ABNORMAL LOW (ref 12.0–15.0)
MCH: 29.1 pg (ref 26.0–34.0)
MCHC: 32 g/dL (ref 30.0–36.0)
MCV: 90.8 fL (ref 78.0–100.0)
Platelets: 183 10*3/uL (ref 150–400)
RBC: 3.68 MIL/uL — ABNORMAL LOW (ref 3.87–5.11)
RDW: 15.8 % — ABNORMAL HIGH (ref 11.5–15.5)
WBC: 6 10*3/uL (ref 4.0–10.5)

## 2015-09-14 LAB — RENAL FUNCTION PANEL
Albumin: 3.2 g/dL — ABNORMAL LOW (ref 3.5–5.0)
Anion gap: 14 (ref 5–15)
BUN: 44 mg/dL — ABNORMAL HIGH (ref 6–20)
CO2: 26 mmol/L (ref 22–32)
Calcium: 8.8 mg/dL — ABNORMAL LOW (ref 8.9–10.3)
Chloride: 97 mmol/L — ABNORMAL LOW (ref 101–111)
Creatinine, Ser: 7.79 mg/dL — ABNORMAL HIGH (ref 0.44–1.00)
GFR calc Af Amer: 5 mL/min — ABNORMAL LOW (ref >60)
GFR calc non Af Amer: 4 mL/min — ABNORMAL LOW (ref >60)
Glucose, Bld: 90 mg/dL (ref 65–99)
Phosphorus: 5.9 mg/dL — ABNORMAL HIGH (ref 2.5–4.6)
Potassium: 5.1 mmol/L (ref 3.5–5.1)
Sodium: 137 mmol/L (ref 135–145)

## 2015-09-14 LAB — CREATININE, SERUM
CREATININE: 6.48 mg/dL — AB (ref 0.44–1.00)
GFR calc Af Amer: 6 mL/min — ABNORMAL LOW (ref >60)
GFR, EST NON AFRICAN AMERICAN: 5 mL/min — AB (ref >60)

## 2015-09-14 LAB — ECHOCARDIOGRAM COMPLETE
HEIGHTINCHES: 62 in
WEIGHTICAEL: 2398.6 [oz_av]

## 2015-09-14 LAB — MRSA PCR SCREENING: MRSA by PCR: NEGATIVE

## 2015-09-14 LAB — HEPATITIS B SURFACE ANTIGEN: Hepatitis B Surface Ag: NEGATIVE

## 2015-09-14 LAB — TSH: TSH: 2.381 u[IU]/mL (ref 0.350–4.500)

## 2015-09-14 MED ORDER — SODIUM CHLORIDE 0.9 % IV SOLN: 100.0000 mL | INTRAVENOUS | Status: DC | PRN

## 2015-09-14 MED ORDER — CALCIUM CARBONATE 1250 MG/5ML PO SUSP: 500.0000 mg | Freq: Four times a day (QID) | ORAL | Status: DC | PRN

## 2015-09-14 MED ORDER — SORBITOL 70 % SOLN
30.0000 mL | Status: DC | PRN
Start: 2015-09-14 — End: 2015-09-15

## 2015-09-14 MED ORDER — HYDROMORPHONE HCL 1 MG/ML IJ SOLN
0.5000 mg | Freq: Once | INTRAMUSCULAR | Status: AC
  Administered 2015-09-14: 0.5 mg via INTRAVENOUS
  Filled 2015-09-14: qty 1

## 2015-09-14 MED ORDER — CAMPHOR-MENTHOL 0.5-0.5 % EX LOTN
1.0000 "application " | TOPICAL_LOTION | Freq: Three times a day (TID) | CUTANEOUS | Status: DC | PRN
  Filled 2015-09-14: qty 222

## 2015-09-14 MED ORDER — NEPRO/CARBSTEADY PO LIQD: 237.0000 mL | Freq: Three times a day (TID) | ORAL | Status: DC | PRN

## 2015-09-14 MED ORDER — DOXERCALCIFEROL 4 MCG/2ML IV SOLN
INTRAVENOUS | Status: AC
  Administered 2015-09-14: 2 ug
  Filled 2015-09-14: qty 2

## 2015-09-14 MED ORDER — DOCUSATE SODIUM 283 MG RE ENEM
1.0000 | ENEMA | RECTAL | Status: DC | PRN
  Filled 2015-09-14: qty 1

## 2015-09-14 MED ORDER — HYDRALAZINE HCL 20 MG/ML IJ SOLN: 10.0000 mg | INTRAMUSCULAR | Status: DC | PRN

## 2015-09-14 MED ORDER — HEPARIN SODIUM (PORCINE) 1000 UNIT/ML DIALYSIS: 20.0000 [IU]/kg | INTRAMUSCULAR | Status: DC | PRN

## 2015-09-14 MED ORDER — RENA-VITE PO TABS
1.0000 | ORAL_TABLET | Freq: Every day | ORAL | Status: DC
  Administered 2015-09-14: 1 via ORAL
  Filled 2015-09-14: qty 1

## 2015-09-14 MED ORDER — FLUTICASONE PROPIONATE 50 MCG/ACT NA SUSP
2.0000 | Freq: Every day | NASAL | Status: DC
  Administered 2015-09-14 – 2015-09-15 (×2): 2 via NASAL
  Filled 2015-09-14 (×2): qty 16

## 2015-09-14 MED ORDER — PENTAFLUOROPROP-TETRAFLUOROETH EX AERO: 1.0000 "application " | INHALATION_SPRAY | CUTANEOUS | Status: DC | PRN

## 2015-09-14 MED ORDER — CALCIUM ACETATE (PHOS BINDER) 667 MG PO CAPS
1334.0000 mg | ORAL_CAPSULE | Freq: Three times a day (TID) | ORAL | Status: DC
  Administered 2015-09-14 – 2015-09-15 (×4): 1334 mg via ORAL
  Filled 2015-09-14 (×4): qty 2

## 2015-09-14 MED ORDER — HYDROXYZINE HCL 25 MG PO TABS
ORAL_TABLET | ORAL | Status: AC
  Filled 2015-09-14: qty 1

## 2015-09-14 MED ORDER — NEPRO/CARBSTEADY PO LIQD: 237.0000 mL | Freq: Two times a day (BID) | ORAL | Status: DC

## 2015-09-14 MED ORDER — DOXERCALCIFEROL 4 MCG/2ML IV SOLN: 2.0000 ug | INTRAVENOUS | Status: DC

## 2015-09-14 MED ORDER — HEPARIN SODIUM (PORCINE) 1000 UNIT/ML DIALYSIS: 1000.0000 [IU] | INTRAMUSCULAR | Status: DC | PRN

## 2015-09-14 MED ORDER — ONDANSETRON HCL 4 MG PO TABS: 4.0000 mg | ORAL_TABLET | Freq: Four times a day (QID) | ORAL | Status: DC | PRN

## 2015-09-14 MED ORDER — SODIUM CHLORIDE 0.9% FLUSH
3.0000 mL | Freq: Two times a day (BID) | INTRAVENOUS | Status: DC
  Administered 2015-09-14 – 2015-09-15 (×4): 3 mL via INTRAVENOUS

## 2015-09-14 MED ORDER — KETOROLAC TROMETHAMINE 0.5 % OP SOLN
1.0000 [drp] | Freq: Four times a day (QID) | OPHTHALMIC | Status: DC
  Filled 2015-09-14: qty 3

## 2015-09-14 MED ORDER — IBUPROFEN 400 MG PO TABS
400.0000 mg | ORAL_TABLET | ORAL | Status: DC | PRN
  Administered 2015-09-14 (×2): 400 mg via ORAL
  Filled 2015-09-14 (×2): qty 1

## 2015-09-14 MED ORDER — ONDANSETRON HCL 4 MG/2ML IJ SOLN: 4.0000 mg | Freq: Four times a day (QID) | INTRAMUSCULAR | Status: DC | PRN

## 2015-09-14 MED ORDER — AMLODIPINE BESYLATE 10 MG PO TABS: 10.0000 mg | ORAL_TABLET | Freq: Every day | ORAL | Status: DC

## 2015-09-14 MED ORDER — CALCIUM ACETATE (PHOS BINDER) 667 MG PO CAPS: 667.0000 mg | ORAL_CAPSULE | Freq: Every day | ORAL | Status: DC

## 2015-09-14 MED ORDER — ALTEPLASE 2 MG IJ SOLR: 2.0000 mg | Freq: Once | INTRAMUSCULAR | Status: DC | PRN

## 2015-09-14 MED ORDER — HYDRALAZINE HCL 50 MG PO TABS: 50.0000 mg | ORAL_TABLET | Freq: Every day | ORAL | Status: DC

## 2015-09-14 MED ORDER — LIDOCAINE HCL (PF) 1 % IJ SOLN: 5.0000 mL | INTRAMUSCULAR | Status: DC | PRN

## 2015-09-14 MED ORDER — LIDOCAINE-PRILOCAINE 2.5-2.5 % EX CREA: 1.0000 "application " | TOPICAL_CREAM | CUTANEOUS | Status: DC | PRN

## 2015-09-14 MED ORDER — GUAIFENESIN-DM 100-10 MG/5ML PO SYRP: 5.0000 mL | ORAL_SOLUTION | Freq: Three times a day (TID) | ORAL | Status: DC | PRN

## 2015-09-14 MED ORDER — HYDROXYZINE HCL 25 MG PO TABS
25.0000 mg | ORAL_TABLET | Freq: Three times a day (TID) | ORAL | Status: DC | PRN
  Administered 2015-09-14: 25 mg via ORAL

## 2015-09-14 MED ORDER — HEPARIN SODIUM (PORCINE) 5000 UNIT/ML IJ SOLN
5000.0000 [IU] | Freq: Three times a day (TID) | INTRAMUSCULAR | Status: DC
  Administered 2015-09-14: 5000 [IU] via SUBCUTANEOUS
  Filled 2015-09-14: qty 1

## 2015-09-14 MED ORDER — PREDNISONE 20 MG PO TABS
40.0000 mg | ORAL_TABLET | Freq: Every day | ORAL | Status: DC
  Administered 2015-09-14 – 2015-09-15 (×2): 40 mg via ORAL
  Filled 2015-09-14 (×2): qty 2

## 2015-09-14 MED ORDER — TAMOXIFEN CITRATE 10 MG PO TABS
20.0000 mg | ORAL_TABLET | Freq: Every day | ORAL | Status: DC
  Administered 2015-09-14 – 2015-09-15 (×2): 20 mg via ORAL
  Filled 2015-09-14 (×2): qty 2

## 2015-09-14 MED ORDER — CLONIDINE HCL 0.1 MG PO TABS
0.1000 mg | ORAL_TABLET | Freq: Two times a day (BID) | ORAL | Status: DC
Start: 2015-09-14 — End: 2015-09-15
  Administered 2015-09-14 – 2015-09-15 (×3): 0.1 mg via ORAL
  Filled 2015-09-14 (×3): qty 1
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
## 2015-09-14 NOTE — Progress Notes (Signed)
  Echocardiogram 2D Echocardiogram has been performed.  Pam Fuller 09/14/2015, 9:37 AM

## 2015-09-14 NOTE — Procedures (Signed)
Patient was seen on dialysis and the procedure was supervised. BFR 300 [address] LABG BP is 119/52.  Patient appears to be tolerating treatment well
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
## 2015-09-14 NOTE — Progress Notes (Signed)
Patient stated she no longer uses eyedrops.  MD notified.

## 2015-09-14 NOTE — Progress Notes (Signed)
New Admission Note:   Arrival Method: via stretcher from ED Mental Orientation: Alert and oriented x4 Telemetry: Placed on Box 6E23; NSR Assessment: Completed Skin: Warm, dry, intact.  IV: Right AC Peripheral IV Normal Saline Locked Pain: c/o 5/10 pain Tubes: N/A Safety Measures: Educated on fall prevention safety plan, patient acknowledged and understood. Admission: Completed [address] [place] Orientation: Patient has been orientated to the room, unit and staff.  Family: Son updated  Orders have been reviewed and implemented. Will continue to monitor the patient. Call light has been placed within reach and bed alarm has been activated.    Dorothea Glassman, RN  Phone number: [phone]
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
## 2015-09-15 DIAGNOSIS — I10 Essential (primary) hypertension: Secondary | ICD-10-CM | POA: Insufficient documentation

## 2015-09-15 DIAGNOSIS — N186 End stage renal disease: Secondary | ICD-10-CM

## 2015-09-15 DIAGNOSIS — Z992 Dependence on renal dialysis: Secondary | ICD-10-CM

## 2015-09-15 DIAGNOSIS — S3992XA Unspecified injury of lower back, initial encounter: Secondary | ICD-10-CM | POA: Diagnosis not present

## 2015-09-15 DIAGNOSIS — E875 Hyperkalemia: Secondary | ICD-10-CM | POA: Diagnosis not present

## 2015-09-15 DIAGNOSIS — R55 Syncope and collapse: Secondary | ICD-10-CM

## 2015-09-15 LAB — GLUCOSE, CAPILLARY: Glucose-Capillary: 122 mg/dL — ABNORMAL HIGH (ref 65–99)

## 2015-09-15 MED ORDER — CALCIUM ACETATE (PHOS BINDER) 667 MG PO CAPS: 1334.0000 mg | ORAL_CAPSULE | Freq: Three times a day (TID) | ORAL | Status: AC

## 2015-09-15 MED ORDER — CAMPHOR-MENTHOL 0.5-0.5 % EX LOTN: 1.0000 "application " | TOPICAL_LOTION | Freq: Three times a day (TID) | CUTANEOUS | Status: DC | PRN

## 2015-09-15 MED ORDER — NEPRO/CARBSTEADY PO LIQD: 237.0000 mL | Freq: Three times a day (TID) | ORAL | Status: DC | PRN

## 2015-09-15 MED ORDER — RENA-VITE PO TABS: 1.0000 | ORAL_TABLET | Freq: Every day | ORAL | Status: AC

## 2015-09-15 MED ORDER — CALCIUM ACETATE (PHOS BINDER) 667 MG PO CAPS: 667.0000 mg | ORAL_CAPSULE | Freq: Every day | ORAL | Status: DC

## 2015-09-15 MED ORDER — NEPRO/CARBSTEADY PO LIQD: 237.0000 mL | Freq: Two times a day (BID) | ORAL | Status: DC

## 2015-09-15 MED ORDER — FLUTICASONE PROPIONATE 50 MCG/ACT NA SUSP: 2.0000 | Freq: Every day | NASAL | Status: AC

## 2015-09-15 MED ORDER — HYDROXYZINE HCL 25 MG PO TABS: 25.0000 mg | ORAL_TABLET | Freq: Three times a day (TID) | ORAL | Status: AC | PRN
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
## 2015-09-15 NOTE — Progress Notes (Signed)
Discharge instructions and medications discussed with patient.  All questions answered.  

## 2015-12-08 ENCOUNTER — Ambulatory Visit (INDEPENDENT_AMBULATORY_CARE_PROVIDER_SITE_OTHER): Payer: Medicare Other | Admitting: Ophthalmology

## 2016-01-12 ENCOUNTER — Ambulatory Visit (INDEPENDENT_AMBULATORY_CARE_PROVIDER_SITE_OTHER): Payer: Medicare Other | Admitting: Ophthalmology

## 2016-01-12 DIAGNOSIS — H348322 Tributary (branch) retinal vein occlusion, left eye, stable: Secondary | ICD-10-CM

## 2016-01-12 DIAGNOSIS — H34831 Tributary (branch) retinal vein occlusion, right eye, with macular edema: Secondary | ICD-10-CM

## 2016-01-12 DIAGNOSIS — I10 Essential (primary) hypertension: Secondary | ICD-10-CM | POA: Diagnosis not present

## 2016-01-12 DIAGNOSIS — H43813 Vitreous degeneration, bilateral: Secondary | ICD-10-CM

## 2016-01-12 DIAGNOSIS — H35033 Hypertensive retinopathy, bilateral: Secondary | ICD-10-CM

## 2016-03-02 ENCOUNTER — Other Ambulatory Visit: Payer: Self-pay | Admitting: *Deleted

## 2016-03-02 DIAGNOSIS — C50412 Malignant neoplasm of upper-outer quadrant of left female breast: Secondary | ICD-10-CM

## 2016-03-02 MED ORDER — TAMOXIFEN CITRATE 20 MG PO TABS: 20.0000 mg | ORAL_TABLET | Freq: Every day | ORAL | 1 refills | Status: DC

## 2016-04-23 ENCOUNTER — Telehealth: Payer: Self-pay

## 2016-04-23 NOTE — Telephone Encounter (Signed)
SENT NOTES TO SCHEDULING 

## 2016-04-25 ENCOUNTER — Encounter: Payer: Self-pay | Admitting: *Deleted

## 2016-05-01 ENCOUNTER — Ambulatory Visit: Payer: Medicare Other | Admitting: Physician Assistant

## 2016-05-01 NOTE — Progress Notes (Deleted)
Cardiology Office Note:    Date:  05/01/2016   ID:  Pam Fuller, DOB 1932-08-14, MRN [id number]  PCP:  Salena Saner., MD  Cardiologist:  New - ***  Electrophysiologist:  n/a  Referring MD: Willey Blade, MD   No chief complaint on file. ***  History of Present Illness:    Pam Fuller is a 82 y.o. female with a hx of ***  She was evaluated by Dr. Allegra Lai in 08/2015 ***  Prior CV studies that were reviewed today include:    Echo 09/14/15 Moderate focal basal septal hypertrophy, EF 60-65, normal wall motion, grade 1 diastolic dysfunction, calcified aortic valve, mild aortic stenosis (mean 12, peak 22), moderate MAC, PASP 33   Past Medical History:  Diagnosis Date  . Arthritis   . Blood transfusion without reported diagnosis   . Cancer [facility])    patient states she had br ca 13 years ago  . ESRD (end stage renal disease) on dialysis ([place])   . Hypertension   . Renal disorder    patient on dialysis  . Wears glasses     Past Surgical History:  Procedure Laterality Date  . ABDOMINAL HYSTERECTOMY    . APPENDECTOMY    . BREAST LUMPECTOMY WITH AXILLARY LYMPH NODE BIOPSY  2001   left  . BREAST SURGERY    . COLONOSCOPY    . PARTIAL MASTECTOMY WITH NEEDLE LOCALIZATION Left 06/18/2013   Procedure: PARTIAL MASTECTOMY WITH NEEDLE LOCALIZATION;  Surgeon: Adin Hector, MD;  Location: [place];  Service: General;  Laterality: Left;  . TONSILLECTOMY      Current Medications: No outpatient prescriptions have been marked as taking for the 05/01/16 encounter (Appointment) with Liliane Shi, PA-C.     Allergies:   Erythromycin and Vicodin [hydrocodone-acetaminophen]   Social History   Social History  . Marital status: Widowed    Spouse name: N/A  . Number of children: N/A  . Years of education: N/A   Social History Main Topics  . Smoking status: Never Smoker  . Smokeless tobacco: Never Used  . Alcohol use No  . Drug use: No    . Sexual activity: Not on file   Other Topics Concern  . Not on file   Social History Narrative  . No narrative on file     Family History:  The patient's ***family history includes Cancer in her maternal aunt.   ROS:   Please see the history of present illness.    ROS All other systems reviewed and are negative.   EKGs/Labs/Other Test Reviewed:    EKG:  EKG is *** ordered today.  The ekg ordered today demonstrates ***  Recent Labs: 09/13/2015: Magnesium 2.4 09/14/2015: BUN 44; Creatinine, Ser 7.79; Hemoglobin 10.7; Platelets 183; Potassium 5.1; Sodium 137; TSH 2.381   Recent Lipid Panel    Component Value Date/Time   CHOL (H) 04/02/2010 0644    214        ATP III CLASSIFICATION:  <200     mg/dL   Desirable  200-239  mg/dL   Borderline High  >=240    mg/dL   High          TRIG 192 (H) 04/02/2010 0644   HDL 50 04/02/2010 0644   CHOLHDL 4.3 04/02/2010 0644   VLDL 38 04/02/2010 0644   LDLCALC (H) 04/02/2010 0644    126        Total Cholesterol/HDL:CHD Risk Coronary Heart Disease Risk Table  Men   Women  1/2 Average Risk   3.4   3.3  Average Risk       5.0   4.4  2 X Average Risk   9.6   7.1  3 X Average Risk  23.4   11.0        Use the calculated Patient Ratio above and the CHD Risk Table to determine the patient's CHD Risk.        ATP III CLASSIFICATION (LDL):  <100     mg/dL   Optimal  100-129  mg/dL   Near or Above                    Optimal  130-159  mg/dL   Borderline  160-189  mg/dL   High  >190     mg/dL   Very High     Physical Exam:    VS:  There were no vitals taken for this visit.    Wt Readings from Last 3 Encounters:  09/14/15 149 lb 0.5 oz (67.6 kg)  09/11/15 165 lb (74.8 kg)  01/06/15 153 lb (69.4 kg)     ***Physical Exam  ASSESSMENT:    No diagnosis found. PLAN:    In order of problems listed above:  ***   Medication Adjustments/Labs and Tests Ordered: Current medicines are reviewed at length with the  patient today.  Concerns regarding medicines are outlined above.  Medication changes, Labs and Tests ordered today are outlined in the Patient Instructions noted below. There are no Patient Instructions on file for this visit. Signed, Richardson Dopp, PA-C  05/01/2016 2:13 PM    [facility] [address], [place], [place]  [address] Phone: [phone]; Fax: [phone]

## 2016-05-03 ENCOUNTER — Encounter: Payer: Self-pay | Admitting: Physician Assistant

## 2016-06-28 ENCOUNTER — Other Ambulatory Visit: Payer: Self-pay | Admitting: Oncology

## 2016-06-28 DIAGNOSIS — C50412 Malignant neoplasm of upper-outer quadrant of left female breast: Secondary | ICD-10-CM

## 2016-06-29 ENCOUNTER — Telehealth: Payer: Self-pay | Admitting: *Deleted

## 2016-06-29 NOTE — Telephone Encounter (Signed)
Per refill request noted no show per prior appointment-  Limited refill given for tamoxifen and LOS sent to scheduling for appointment with requ

## 2016-07-04 ENCOUNTER — Telehealth: Payer: Self-pay | Admitting: Oncology

## 2016-07-04 NOTE — Telephone Encounter (Signed)
lvm to inform pt of 2/27 appts per LOS. Instructed pt to return call to office if appt date/time conflicts with dialysis  appt

## 2016-07-11 NOTE — Progress Notes (Deleted)
CLINIC:  Survivorship   REASON FOR VISIT:  Routine follow-up for history of breast cancer.   BRIEF ONCOLOGIC HISTORY:  Per Dr. Jana Fuller note: (1) status post left lumpectomy and sentinel lymph node sampling January of 2001 for a pT1b pN0, stage IA invasive ductal carcinoma, grade 2, estrogen receptor 97% positive, progesterone receptor 90% positive, with an MIB-1 of 10%  (2) status post adjuvant radiation to the left breast  (3) status post tamoxifen given for 5 years, completed February of 2006  (4) status post left breast biopsy 03/26/2013 for a clinical T1a N0, stage IA invasive ductal carcinoma, with lobular features, grade 1, estrogen receptor 100% positive, progesterone receptor 94% positive, with an MIB-1 of 8% and no HER-2 amplification  (5) status post left lumpectomy 06/18/2013 for a pT1b NX, stage IA invasive ductal carcinoma, grade 1, repeat HER-2 testing again negative  (6) tamoxifen started March 2015   (7) status post remote hysterectomy   INTERVAL HISTORY:  Pam Fuller presents to the Survivorship Clinic today for routine follow-up for her history of breast cancer.  Overall, she reports feeling quite well. ***    REVIEW OF SYSTEMS:  ***     Breast: Denies any new nodularity, masses, tenderness, nipple changes, or nipple discharge.    A 14-point review of systems was completed and was negative, except as noted above.    PAST MEDICAL/SURGICAL HISTORY:  Past Medical History:  Diagnosis Date  . Arthritis   . Blood transfusion without reported diagnosis   . Cancer [facility])    patient states she had br ca 13 years ago  . ESRD (end stage renal disease) on dialysis ([place])   . Hypertension   . Renal disorder    patient on dialysis  . Wears glasses    Past Surgical History:  Procedure Laterality Date  . ABDOMINAL HYSTERECTOMY    . APPENDECTOMY    . BREAST LUMPECTOMY WITH AXILLARY LYMPH NODE BIOPSY  2001   left  . BREAST SURGERY    . COLONOSCOPY     . PARTIAL MASTECTOMY WITH NEEDLE LOCALIZATION Left 06/18/2013   Procedure: PARTIAL MASTECTOMY WITH NEEDLE LOCALIZATION;  Surgeon: Pam Hector, MD;  Location: [place];  Service: General;  Laterality: Left;  . TONSILLECTOMY       ALLERGIES:  Allergies  Allergen Reactions  . Erythromycin Nausea And Vomiting  . Vicodin [Hydrocodone-Acetaminophen]     unknown     CURRENT MEDICATIONS:  Outpatient Encounter Prescriptions as of 07/17/2016  Medication Sig  . amLODipine (NORVASC) 10 MG tablet Take 10 mg by mouth daily.   . calcium acetate (PHOSLO) 667 MG capsule Take 2 capsules (1,334 mg total) by mouth 3 (three) times daily with meals.  . calcium acetate (PHOSLO) 667 MG capsule Take 1 capsule (667 mg total) by mouth daily at 8 pm.  . camphor-menthol (SARNA) lotion Apply 1 application topically every 8 (eight) hours as needed for itching.  . cloNIDine (CATAPRES) 0.1 MG tablet Take 0.1 mg by mouth 2 (two) times daily.   . colchicine 0.6 MG tablet Take 0.6 mg by mouth 2 (two) times daily.  . fluticasone (FLONASE) 50 MCG/ACT nasal spray Place 2 sprays into both nostrils daily.  . hydrALAZINE (APRESOLINE) 50 MG tablet Take 50 mg by mouth daily.   . hydrOXYzine (ATARAX/VISTARIL) 25 MG tablet Take 1 tablet (25 mg total) by mouth every 8 (eight) hours as needed for itching.  . multivitamin (RENA-VIT) TABS tablet Take 1 tablet by mouth  at bedtime.  . Nutritional Supplements (FEEDING SUPPLEMENT, NEPRO CARB STEADY,) LIQD Take 237 mLs by mouth 2 (two) times daily between meals.  . Nutritional Supplements (FEEDING SUPPLEMENT, NEPRO CARB STEADY,) LIQD Take 237 mLs by mouth 3 (three) times daily as needed (Supplement).  . PROLENSA 0.07 % SOLN Place 1 drop into the left eye daily.   . tamoxifen (NOLVADEX) 20 MG tablet Take 1 tablet (20 mg total) by mouth daily.   No facility-administered encounter medications on file as of 07/17/2016.      ONCOLOGIC FAMILY HISTORY:  Family History    Problem Relation Age of Onset  . Cancer Maternal Aunt     breast    GENETIC COUNSELING/TESTING: ***  SOCIAL HISTORY:  Pam Fuller is /single/married/divorced/widowed/separated and lives alone/[place] her spouse/family/[place].  She has (#) children and they live in (city).  Pam Fuller is currently retired/disabled/working part-time/full-time as ***.  She denies any current or history of tobacco, alcohol, or illicit drug use.     PHYSICAL EXAMINATION:  Vital Signs: There were no vitals filed for this visit. There were no vitals filed for this visit. General: Well-nourished, well-appearing female in no acute distress.  Unaccompanied/Accompanied by***** today.   HEENT: Head is normocephalic.  Pupils equal and reactive to light. Conjunctivae clear without exudate.  Sclerae anicteric. Oral mucosa is pink, moist.  Oropharynx is pink without lesions or erythema.  Lymph: No cervical, supraclavicular, or infraclavicular lymphadenopathy noted on palpation.  Cardiovascular: Regular rate and rhythm.[name] Respiratory: Clear to auscultation bilaterally. Chest expansion symmetric; breathing non-labored.  Breast Exam:  -Left breast: No appreciable masses on palpation. No skin redness, thickening, or peau d'orange appearance; no nipple retraction or nipple discharge; mild distortion in symmetry at previous lumpectomy site***healed scar without erythema or nodularity.  -Right breast: No appreciable masses on palpation. No skin redness, thickening, or peau d'orange appearance; no nipple retraction or nipple discharge; mild distortion in symmetry at previous lumpectomy site***healed scar without erythema or nodularity. -Axilla: No axillary adenopathy bilaterally.  GI: Abdomen soft and round; non-tender, non-distended. Bowel sounds normoactive. No hepatosplenomegaly.   GU: Deferred.  Neuro: No focal deficits. Steady gait.  Psych: Mood and affect normal and appropriate for situation.   Extremities: No edema. Skin: Warm and dry.  LABORATORY DATA:  None for this visit***   DIAGNOSTIC IMAGING:  Most recent mammogram: ***    ASSESSMENT AND PLAN:  Ms.. Fuller is a pleasant 83 y.o. female with history of Stage *** right/left breast invasive ductal carcinoma, ER+/PR+/HER2-, diagnosed in (date), treated with lumpectomy, adjuvant radiation therapy, and anti-estrogen therapy with *** beginning in (date).  She presents to the Survivorship Clinic for surveillance and routine follow-up.   1. History of breast cancer:  Ms. Fragoso is currently clinically and radiographically without evidence of disease or recurrence of breast cancer. She will be due for mammogram in ***; orders placed today.  She will continue her anti-estrogen therapy with ***, with plans to continue for *** years.  She will return to the [facility] to see her medical oncologist, Dr. ***, in ***/2018.  I encouraged her to call me with any questions or concerns before her next visit at the [facility], and I would be happy to see her sooner, if needed.    #. Problem(s) at Visit___________________.  #. Bone health:  Given Ms. Whitt's age, history of breast cancer, and her current anti-estrogen therapy with ________, she is at risk for bone demineralization. Her last DEXA scan  was on **/**/20**.  In the meantime, she was encouraged to increase her consumption of foods rich in calcium, as well as increase her weight-bearing activities.  She was given education on specific food and activities to promote bone health.  #. Cancer screening:  Due to Ms. Kennington's history and her age, she should receive screening for skin cancers, colon cancer, and ***gynecologic cancers. She was encouraged to follow-up with her PCP for appropriate cancer screenings.   #. Health maintenance and wellness promotion: Ms. Barfuss was encouraged to consume 5-7 servings of fruits and vegetables per day. She was also encouraged to engage in moderate  to vigorous exercise for 30 minutes per day most days of the week. She was instructed to limit her alcohol consumption and continue to abstain from tobacco use/was encouraged stop smoking.  ***    Dispo:  -Return to [facility] ***   A total of (#) minutes of face-to-face time was spent with this patient with greater than 50% of that time in counseling and care-coordination.   Charlestine Massed, NP [place] [phone]   Note: PRIMARY CARE PROVIDER Salena Saner., MD [phone] lt

## 2016-07-16 ENCOUNTER — Other Ambulatory Visit: Payer: Self-pay | Admitting: Adult Health

## 2016-07-16 DIAGNOSIS — C50412 Malignant neoplasm of upper-outer quadrant of left female breast: Secondary | ICD-10-CM

## 2016-07-17 ENCOUNTER — Other Ambulatory Visit: Payer: Medicare Other

## 2016-07-17 ENCOUNTER — Encounter: Payer: Self-pay | Admitting: Adult Health

## 2016-07-17 ENCOUNTER — Ambulatory Visit: Payer: Medicare Other | Admitting: Adult Health

## 2016-09-26 ENCOUNTER — Other Ambulatory Visit: Payer: Self-pay | Admitting: Oncology

## 2016-09-26 DIAGNOSIS — C50412 Malignant neoplasm of upper-outer quadrant of left female breast: Secondary | ICD-10-CM

## 2017-01-09 ENCOUNTER — Other Ambulatory Visit: Payer: Self-pay | Admitting: Oncology

## 2017-01-09 DIAGNOSIS — C50412 Malignant neoplasm of upper-outer quadrant of left female breast: Secondary | ICD-10-CM

## 2017-01-11 NOTE — Telephone Encounter (Signed)
Noted pt note seen since 2016 - with no show for follow up.  Message left on VM of number per demographic and will await return call.

## 2017-01-17 ENCOUNTER — Ambulatory Visit (INDEPENDENT_AMBULATORY_CARE_PROVIDER_SITE_OTHER): Payer: Medicare Other | Admitting: Ophthalmology

## 2017-01-23 ENCOUNTER — Ambulatory Visit: Payer: Medicare Other | Admitting: Oncology

## 2017-01-23 ENCOUNTER — Other Ambulatory Visit: Payer: Medicare Other

## 2017-01-24 ENCOUNTER — Telehealth: Payer: Self-pay | Admitting: Oncology

## 2017-01-24 NOTE — Telephone Encounter (Signed)
Pt called to r/s missed appts. Gave pt 9/25 at 230 pm per request. sched appt with [facility] due to [facility] schedule is full

## 2017-02-11 ENCOUNTER — Other Ambulatory Visit: Payer: Self-pay

## 2017-02-11 DIAGNOSIS — Z170 Estrogen receptor positive status [ER+]: Principal | ICD-10-CM

## 2017-02-11 DIAGNOSIS — C50412 Malignant neoplasm of upper-outer quadrant of left female breast: Secondary | ICD-10-CM

## 2017-02-12 ENCOUNTER — Telehealth: Payer: Self-pay | Admitting: Adult Health

## 2017-02-12 ENCOUNTER — Other Ambulatory Visit (HOSPITAL_BASED_OUTPATIENT_CLINIC_OR_DEPARTMENT_OTHER): Payer: Medicare Other

## 2017-02-12 ENCOUNTER — Ambulatory Visit (HOSPITAL_BASED_OUTPATIENT_CLINIC_OR_DEPARTMENT_OTHER): Payer: Medicare Other | Admitting: Adult Health

## 2017-02-12 ENCOUNTER — Encounter: Payer: Self-pay | Admitting: Adult Health

## 2017-02-12 VITALS — BP 176/70 | HR 67 | Temp 98.1°F | Resp 20 | Ht 62.0 in | Wt 138.2 lb

## 2017-02-12 DIAGNOSIS — Z170 Estrogen receptor positive status [ER+]: Principal | ICD-10-CM

## 2017-02-12 DIAGNOSIS — C50412 Malignant neoplasm of upper-outer quadrant of left female breast: Secondary | ICD-10-CM

## 2017-02-12 DIAGNOSIS — Z853 Personal history of malignant neoplasm of breast: Secondary | ICD-10-CM

## 2017-02-12 DIAGNOSIS — Z992 Dependence on renal dialysis: Secondary | ICD-10-CM | POA: Diagnosis not present

## 2017-02-12 DIAGNOSIS — Z79811 Long term (current) use of aromatase inhibitors: Secondary | ICD-10-CM | POA: Diagnosis not present

## 2017-02-12 LAB — COMPREHENSIVE METABOLIC PANEL
ALBUMIN: 3.3 g/dL — AB (ref 3.5–5.0)
ALK PHOS: 75 U/L (ref 40–150)
ALT: 7 U/L (ref 0–55)
ANION GAP: 9 meq/L (ref 3–11)
AST: 13 U/L (ref 5–34)
BILIRUBIN TOTAL: 0.68 mg/dL (ref 0.20–1.20)
BUN: 16.9 mg/dL (ref 7.0–26.0)
CALCIUM: 9.2 mg/dL (ref 8.4–10.4)
CO2: 30 mEq/L — ABNORMAL HIGH (ref 22–29)
Chloride: 102 mEq/L (ref 98–109)
Creatinine: 6.2 mg/dL (ref 0.6–1.1)
EGFR: 7 mL/min/{1.73_m2} — AB (ref >90)
GLUCOSE: 88 mg/dL (ref 70–140)
Potassium: 3.9 mEq/L (ref 3.5–5.1)
Sodium: 142 mEq/L (ref 136–145)
TOTAL PROTEIN: 6.8 g/dL (ref 6.4–8.3)

## 2017-02-12 LAB — CBC WITH DIFFERENTIAL/PLATELET
BASO%: 0.2 % (ref 0.0–2.0)
BASOS ABS: 0 10*3/uL (ref 0.0–0.1)
EOS ABS: 0.6 10*3/uL — AB (ref 0.0–0.5)
EOS%: 6.6 % (ref 0.0–7.0)
HCT: 36.4 % (ref 34.8–46.6)
HEMOGLOBIN: 11.5 g/dL — AB (ref 11.6–15.9)
LYMPH%: 18.9 % (ref 14.0–49.7)
MCH: 29.4 pg (ref 25.1–34.0)
MCHC: 31.6 g/dL (ref 31.5–36.0)
MCV: 93.1 fL (ref 79.5–101.0)
MONO#: 0.4 10*3/uL (ref 0.1–0.9)
MONO%: 4.1 % (ref 0.0–14.0)
NEUT%: 70.2 % (ref 38.4–76.8)
NEUTROS ABS: 6.7 10*3/uL — AB (ref 1.5–6.5)
PLATELETS: 206 10*3/uL (ref 145–400)
RBC: 3.91 10*6/uL (ref 3.70–5.45)
RDW: 16.6 % — AB (ref 11.2–14.5)
WBC: 9.5 10*3/uL (ref 3.9–10.3)
lymph#: 1.8 10*3/uL (ref 0.9–3.3)

## 2017-02-12 MED ORDER — TAMOXIFEN CITRATE 20 MG PO TABS: 20.0000 mg | ORAL_TABLET | Freq: Every day | ORAL | 5 refills | Status: AC

## 2017-02-12 NOTE — Telephone Encounter (Signed)
Gave patient avs and calendar with upcoming appts.  °

## 2017-02-12 NOTE — Progress Notes (Signed)
You will in her would like me to Dr. Dr. Iona Beard or you get a home ID: EDLIN FORD OB: [birth year]  MR#: [id number]  YBW#:[id number]  PCP: Willey Blade, MD GYN:   SU: Fanny Skates OTHER MD: [facility] receptor positive breast cancer  CURRENT TREATMENT: Tamoxifen  BREAST CANCER HISTORY: From the earlier summary: Pam Fuller underwent left lumpectomy and sentinel lymph node sampling under her Dr. Kathrin Penner 05/31/1999 for a 9 mm invasive ductal carcinoma, grade 2, with 0 of 3 sentinel lymph nodes involved. The tumor was 97% estrogen receptor positive, 98% progesterone receptor positive, and had a proliferation marker of 10%. HER-2/neu was negative by the HercepTest. It was diploid. After completing adjuvant radiation treatment the patient took tamoxifen for 5 years, completed in the spring of 2006.  More recently, in 03/19/2013 the patient had bilateral screening mammography at [facility]. There was a focal asymmetry in the left breast at the 1:00 position which by ultrasound on the same day proved to be a 5 mm irregular mass. A 4 mm complex cyst was also noted in the left axillary tail, consistent with a sebaceous cyst.  Biopsy of the left breast mass in question 03/26/2013 showed an invasive ductal carcinoma, with lobular features, grade 1, estrogen receptor 100% positive, progesterone receptor 94% positive, with an MIB-1 of 8% and no HER-2 amplification.  The patient's subsequent history is as detailed below  INTERVAL HISTORY: Pam Fuller returns today for follow up of her breast cancer.  She is doing well.  She is taking Tamoxifen daily and is tolerating it moderately well.  She denies any problems with taking it.    ROS:  Mikena continues to do well.  She does have ESRD and gets dialysis 3 times a week.  She is tolerating that well.  She denies any breast changes.  She follows up with her PCP regularly.  Otherwise, a detailed ROS was conducted and is  non contributory.   PAST MEDICAL HISTORY: Past Medical History:  Diagnosis Date  . Arthritis   . Blood transfusion without reported diagnosis   . Cancer [facility])    patient states she had br ca 13 years ago  . ESRD (end stage renal disease) on dialysis ([place])   . Hypertension   . Renal disorder    patient on dialysis  . Wears glasses     PAST SURGICAL HISTORY: Past Surgical History:  Procedure Laterality Date  . ABDOMINAL HYSTERECTOMY    . APPENDECTOMY    . BREAST LUMPECTOMY WITH AXILLARY LYMPH NODE BIOPSY  2001   left  . BREAST SURGERY    . COLONOSCOPY    . PARTIAL MASTECTOMY WITH NEEDLE LOCALIZATION Left 06/18/2013   Procedure: PARTIAL MASTECTOMY WITH NEEDLE LOCALIZATION;  Surgeon: Adin Hector, MD;  Location: [facility];  Service: General;  Laterality: Left;  . TONSILLECTOMY      FAMILY HISTORY Family History  Problem Relation Age of Onset  . Cancer Maternal Aunt        breast   the patient's father died in his 109s from what appears to have been complications of atherosclerotic cardiovascular disease. The patient's mother died in her 76O from complications of diabetes. Jaspreet has one adopted brother. She has 2 full sisters. There is no breast or ovarian cancer in the immediate family. One of the patient's mother is 3 sisters was diagnosed with breast cancer but the patient does not know at what age.  GYNECOLOGIC HISTORY:  Menarche age 78,  first live birth age 81. She is GX P2. She underwent hysterectomy remotely, but does not know if her ovaries were removed. 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 She is a widow and lives with her son Pam Fuller, who is self-employed in Advertising copywriter. Son Pam Fuller is completing his PhD in business and health care. The patient has 3 grandchildren she attends a local [place]: Not in  place; the patient was given the appropriate documents on her 04/21/2013 visit to declare a healthcare power of attorney   HEALTH MAINTENANCE: Social History  Substance Use Topics  . Smoking status: Never Smoker  . Smokeless tobacco: Never Used  . Alcohol use No     Colonoscopy:  PAP:  Bone density:  Lipid panel:  Allergies  Allergen Reactions  . Erythromycin Nausea And Vomiting  . Vicodin [Hydrocodone-Acetaminophen]     unknown    Current Outpatient Prescriptions  Medication Sig Dispense Refill  . amLODipine (NORVASC) 10 MG tablet Take 10 mg by mouth daily.     . calcium acetate (PHOSLO) 667 MG capsule Take 2 capsules (1,334 mg total) by mouth 3 (three) times daily with meals. 180 capsule 1  . cloNIDine (CATAPRES) 0.1 MG tablet Take 0.1 mg by mouth 2 (two) times daily.     . fluticasone (FLONASE) 50 MCG/ACT nasal spray Place 2 sprays into both nostrils daily. 16 g 2  . hydrALAZINE (APRESOLINE) 50 MG tablet Take 50 mg by mouth daily.     . hydrOXYzine (ATARAX/VISTARIL) 25 MG tablet Take 1 tablet (25 mg total) by mouth every 8 (eight) hours as needed for itching. 30 tablet 0  . multivitamin (RENA-VIT) TABS tablet Take 1 tablet by mouth at bedtime. 30 tablet 0  . tamoxifen (NOLVADEX) 20 MG tablet take 1 tablet by mouth once daily 30 tablet 1   No current facility-administered medications for this visit.     OBJECTIVE:  Vitals:   02/12/17 1517 02/12/17 1518  BP: (!) 176/70 (!) 176/70  Pulse:  67  Resp:  20  Temp:  98.1 F (36.7 C)  SpO2:  100%     Body mass index is 25.28 kg/m.    ECOG FS:1 - Symptomatic but completely ambulatory  GENERAL: Patient is a well appearing female in no acute distress HEENT:  Sclerae anicteric.  Oropharynx clear and moist. No ulcerations or evidence of oropharyngeal candidiasis. Neck is supple.  NODES:  No cervical, supraclavicular, or axillary lymphadenopathy palpated.  BREAST EXAM:  Right breast without any nodules masses skin or nipple  changes, left breast s/p lumpectomy, mild distortion present at lumpectomy site, well healed scar tissue, no nodularity, no mass, no nipple/skin change.  Benign bilateral breast exam.  LUNGS:  Clear to auscultation bilaterally.  No wheezes or rhonchi. HEART:  Regular rate and rhythm. No murmur appreciated. ABDOMEN:  Soft, nontender.  Positive, normoactive bowel sounds. No organomegaly palpated. MSK:  No focal spinal tenderness to palpation. Full range of motion bilaterally in the upper extremities. EXTREMITIES:  No peripheral edema.   SKIN:  Clear with no obvious rashes or skin changes. No nail dyscrasia. NEURO:  Nonfocal. Well oriented.  Appropriate affect.    LAB RESULTS:  CMP     Component Value Date/Time   NA 142 02/12/2017 1400   K 3.9 02/12/2017 1400   CL 97 (L) 09/14/2015 1417  CO2 30 (H) 02/12/2017 1400   GLUCOSE 88 02/12/2017 1400   BUN 16.9 02/12/2017 1400   CREATININE 6.2 (HH) 02/12/2017 1400   CALCIUM 9.2 02/12/2017 1400   PROT 6.8 02/12/2017 1400   ALBUMIN 3.3 (L) 02/12/2017 1400   AST 13 02/12/2017 1400   ALT 7 02/12/2017 1400   ALKPHOS 75 02/12/2017 1400   BILITOT 0.68 02/12/2017 1400   GFRNONAA 4 (L) 09/14/2015 1417   GFRAA 5 (L) 09/14/2015 1417    I No results found for: SPEP  Lab Results  Component Value Date   WBC 9.5 02/12/2017   NEUTROABS 6.7 (H) 02/12/2017   HGB 11.5 (L) 02/12/2017   HCT 36.4 02/12/2017   MCV 93.1 02/12/2017   PLT 206 02/12/2017      Chemistry      Component Value Date/Time   NA 142 02/12/2017 1400   K 3.9 02/12/2017 1400   CL 97 (L) 09/14/2015 1417   CO2 30 (H) 02/12/2017 1400   BUN 16.9 02/12/2017 1400   CREATININE 6.2 (HH) 02/12/2017 1400      Component Value Date/Time   CALCIUM 9.2 02/12/2017 1400   ALKPHOS 75 02/12/2017 1400   AST 13 02/12/2017 1400   ALT 7 02/12/2017 1400   BILITOT 0.68 02/12/2017 1400       No results found for: LABCA2  No components found for: [id number]  No results for input(s): INR  in the last 168 hours.  Urinalysis    Component Value Date/Time   COLORURINE YELLOW 03/31/2010 1319   APPEARANCEUR CLEAR 03/31/2010 1319   LABSPEC 1.013 03/31/2010 1319   PHURINE 6.0 03/31/2010 1319   GLUCOSEU 100 (A) 03/31/2010 1319   HGBUR SMALL (A) 03/31/2010 1319   BILIRUBINUR NEGATIVE 03/31/2010 1319   KETONESUR NEGATIVE 03/31/2010 1319   PROTEINUR >300 (A) 03/31/2010 1319   UROBILINOGEN 0.2 03/31/2010 1319   NITRITE NEGATIVE 03/31/2010 1319   LEUKOCYTESUR NEGATIVE 03/31/2010 1319    STUDIES: No results found.   ASSESSMENT: 83 y.o. [place] woman  (1) status post left lumpectomy and sentinel lymph node sampling January of 2001 for a pT1b pN0, stage IA invasive ductal carcinoma, grade 2, estrogen receptor 97% positive, progesterone receptor 90% positive, with an MIB-1 of 10%  (2) status post adjuvant radiation to the left breast  (3) status post tamoxifen given for 5 years, completed February of 2006  (4) status post left breast biopsy 03/26/2013 for a clinical T1a N0, stage IA invasive ductal carcinoma, with lobular features, grade 1, estrogen receptor 100% positive, progesterone receptor 94% positive, with an MIB-1 of 8% and no HER-2 amplification  (5) status post left lumpectomy 06/18/2013 for a pT1b NX, stage IA invasive ductal carcinoma, grade 1, repeat HER-2 testing again negative  (6) tamoxifen started March 2015   (7) status post remote hysterectomy  PLAN: Rema is doing well today.  She will continue Tamoxifen and I printed a prescription for her today.  I cannot find the most recent mammogram on her.  She has her mammograms done at [facility], so we will call and request one.  I recommended healthy diet and exercise today.  She was recommended to f/u with Dr. Jana Hakim in 6 months.    The above plan was reviewed with Pam Fuller in detail.  She verbalized understanding.    A total of (20) minutes of face-to-face time was spent with this patient with greater than  50% of that time in counseling and care-coordination.   Scot Dock, NP  02/12/2017 3:32 PM

## 2017-07-04 ENCOUNTER — Ambulatory Visit (INDEPENDENT_AMBULATORY_CARE_PROVIDER_SITE_OTHER): Payer: Medicare Other

## 2017-07-04 ENCOUNTER — Other Ambulatory Visit: Payer: Self-pay

## 2017-07-04 ENCOUNTER — Ambulatory Visit: Payer: Medicare Other | Admitting: Physician Assistant

## 2017-07-04 ENCOUNTER — Encounter: Payer: Self-pay | Admitting: Physician Assistant

## 2017-07-04 VITALS — BP 182/74 | HR 98 | Temp 99.0°F | Resp 16 | Ht 62.0 in | Wt 141.0 lb

## 2017-07-04 DIAGNOSIS — M79671 Pain in right foot: Secondary | ICD-10-CM

## 2017-07-04 DIAGNOSIS — M7661 Achilles tendinitis, right leg: Secondary | ICD-10-CM

## 2017-07-04 NOTE — Progress Notes (Signed)
PRIMARY CARE AT [facility] [address] [place] [address] [phone]  Date:  07/04/2017   Name:  Pam Fuller   DOB:  August 07, 1932   MRN:  [id number]  PCP:  Patient, No Pcp Per    History of Present Illness:  Pam Fuller is a 84 y.o. female patient who presents to PCP with  Chief Complaint  Patient presents with  . Foot Pain    pt states her right heel is been hurting for 2 days     2 days of heel pain at the back of her right heel.  Hurts to ambulate.  No swelling or warmth.  No strenuous movement, or trauma.    Patient Active Problem List   Diagnosis Date Noted  . Accelerated hypertension   . Hyperkalemia   . Syncope 09/14/2015  . Malignant neoplasm of upper-outer quadrant of left breast in female, estrogen receptor positive ([place]) 04/07/2013  . ESRD on dialysis ([place]) 04/07/2013  . Unspecified essential hypertension 04/07/2013  . Gout 04/07/2013  . GERD (gastroesophageal reflux disease) 04/07/2013    Past Medical History:  Diagnosis Date  . Arthritis   . Blood transfusion without reported diagnosis   . Cancer [facility])    patient states she had br ca 13 years ago  . ESRD (end stage renal disease) on dialysis ([place])   . Hypertension   . Renal disorder    patient on dialysis  . Wears glasses     Past Surgical History:  Procedure Laterality Date  . ABDOMINAL HYSTERECTOMY    . APPENDECTOMY    . BREAST LUMPECTOMY WITH AXILLARY LYMPH NODE BIOPSY  2001   left  . BREAST SURGERY    . COLONOSCOPY    . PARTIAL MASTECTOMY WITH NEEDLE LOCALIZATION Left 06/18/2013   Procedure: PARTIAL MASTECTOMY WITH NEEDLE LOCALIZATION;  Surgeon: Adin Hector, MD;  Location: [facility];  Service: General;  Laterality: Left;  . TONSILLECTOMY      Social History   Tobacco Use  . Smoking status: Never Smoker  . Smokeless tobacco: Never Used  Substance Use Topics  . Alcohol use: No  . Drug use: No    Family History  Problem Relation Age of Onset  . Cancer  Maternal Aunt        breast    Allergies  Allergen Reactions  . Erythromycin Nausea And Vomiting  . Vicodin [Hydrocodone-Acetaminophen]     unknown    Medication list has been reviewed and updated.  Current Outpatient Medications on File Prior to Visit  Medication Sig Dispense Refill  . amLODipine (NORVASC) 10 MG tablet Take 10 mg by mouth daily.     . calcium acetate (PHOSLO) 667 MG capsule Take 2 capsules (1,334 mg total) by mouth 3 (three) times daily with meals. 180 capsule 1  . cloNIDine (CATAPRES) 0.1 MG tablet Take 0.1 mg by mouth 2 (two) times daily.     . fluticasone (FLONASE) 50 MCG/ACT nasal spray Place 2 sprays into both nostrils daily. 16 g 2  . hydrALAZINE (APRESOLINE) 50 MG tablet Take 50 mg by mouth daily.     . hydrOXYzine (ATARAX/VISTARIL) 25 MG tablet Take 1 tablet (25 mg total) by mouth every 8 (eight) hours as needed for itching. 30 tablet 0  . multivitamin (RENA-VIT) TABS tablet Take 1 tablet by mouth at bedtime. 30 tablet 0  . tamoxifen (NOLVADEX) 20 MG tablet Take 1 tablet (20 mg total) by mouth daily. 30 tablet 5  No current facility-administered medications on file prior to visit.     ROS ROS otherwise unremarkable unless listed above.  Physical Examination: BP (!) 182/74   Pulse 98   Temp 99 F (37.2 C) (Oral)   Resp 16   Ht 5\' 2"  (1.575 m)   Wt 141 lb (64 kg)   SpO2 98%   BMI 25.79 kg/m  Ideal Body Weight: Weight in (lb) to have BMI = 25: 136.4  Physical Exam  Constitutional: She is oriented to person, place, and time. She appears well-developed and well-nourished. No distress.  HENT:  Head: Normocephalic and atraumatic.  Right Ear: External ear normal.  Left Ear: External ear normal.  Eyes: Conjunctivae and EOM are normal. Pupils are equal, round, and reactive to light.  Cardiovascular: Normal rate.  Pulmonary/Chest: Effort normal. No respiratory distress.  Musculoskeletal:  Tender at the posterior foot at the achilles, directly  prominent closer to the calcaneus.  Thompson test without abnormality.  No erythema or warmth.    Neurological: She is alert and oriented to person, place, and time.  Skin: She is not diaphoretic.  Psychiatric: She has a normal mood and affect. Her behavior is normal.   Dg Os Calcis Right  Result Date: 07/04/2017 CLINICAL DATA:  Heel pain. EXAM: RIGHT OS CALCIS - 2+ VIEW COMPARISON:  Radiographs 04/09/2010 FINDINGS: Calcifications noted in the region of the distal Achilles tendon suggesting chronic calcific tendinitis. No definite findings for Achilles tendon rupture. A small calcaneal heel spurs noted. There are mild tibiotalar and subtalar joint degenerative changes and moderate midfoot degenerative changes. No acute fractures identified. IMPRESSION: Distal Achilles calcific tendinitis. Degenerative changes but no acute fracture. Electronically Signed   By: Marijo Sanes M.D.   On: 07/04/2017 18:02     Assessment and Plan: Pam Fuller is a 84 y.o. female who is here today for cc of  Chief Complaint  Patient presents with  . Foot Pain    pt states her right heel is been hurting for 2 days  advised icing regimen. Given heel cup today.   Achilles tendinitis of right lower extremity - Plan: DG Os Calcis Right  Pain of right heel - Plan: DG Os Calcis Right  Ivar Drape, PA-C [facility] 2/15/201912:54 PM

## 2017-07-04 NOTE — Patient Instructions (Addendum)
Please ice the heel three times per day for 15 minutes.    Achilles Tendinitis Rehab Ask your health care provider which exercises are safe for you. Do exercises exactly as told by your health care provider and adjust them as directed. It is normal to feel mild stretching, pulling, tightness, or discomfort as you do these exercises, but you should stop right away if you feel sudden pain or your pain gets worse. Do not begin these exercises until told by your health care provider. Stretching and range of motion exercises These exercises warm up your muscles and joints and improve the movement and flexibility of your ankle. These exercises also help to relieve pain, numbness, and tingling. Exercise A: Standing wall calf stretch, knee straight  1. Stand with your hands against a wall. 2. Extend your __________ leg behind you and bend your front knee slightly. Keep both of your heels on the floor. 3. Point the toes of your back foot slightly inward. 4. Keeping your heels on the floor and your back knee straight, shift your weight toward the wall. Do not allow your back to arch. You should feel a gentle stretch in your calf. 5. Hold this position for seconds. Repeat __________ times. Complete this stretch __________ times per day. Exercise B: Standing wall calf stretch, knee bent 1. Stand with your hands against a wall. 2. Extend your __________ leg behind you, and bend your front knee slightly. Keep both of your heels on the floor. 3. Point the toes of your back foot slightly inward. 4. Keeping your heels on the floor, unlock your back knee so that it is bent. You should feel a gentle stretch deep in your calf. 5. Hold this position for __________ seconds. Repeat __________ times. Complete this stretch __________ times per day. Strengthening exercises These exercises build strength and control of your ankle. Endurance is the ability to use your muscles for a long time, even after they get  tired. Exercise C: Plantar flexion with band  1. Sit on the floor with your __________ leg extended. You may put a pillow under your calf to give your foot more room to move. 2. Loop a rubber exercise band or tube around the ball of your __________ foot. The ball of your foot is on the walking surface, right under your toes. The band or tube should be slightly tense when your foot is relaxed. If the band or tube slips, you can put on your shoe or put a washcloth between the band and your foot to help it stay in place. 3. Slowly point your toes downward, pushing them away from you. 4. Hold this position for __________ seconds. 5. Slowly release the tension in the band or tube, controlling smoothly until your foot is back to the starting position. Repeat __________ times. Complete this exercise __________ times per day. Exercise D: Heel raise with eccentric lower  1. Stand on a step with the balls of your feet. The ball of your foot is on the walking surface, right under your toes. ? Do not put your heels on the step. ? For balance, rest your hands on the wall or on a railing. 2. Rise up onto the balls of your feet. 3. Keeping your heels up, shift all of your weight to your __________ leg and pick up your other leg. 4. Slowly lower your __________ leg so your heel drops below the level of the step. 5. Put down your foot. If told by your health care  provider, build up to:  3 sets of 15 repetitions while keeping your knees straight.  3 sets of 15 repetitions while keeping your knees bent as far as told by your health care provider.  Complete this exercise __________ times per day. If this exercise is too easy, try doing it while wearing a backpack with weights in it. Balance exercises These exercises improve or maintain your balance. Balance is important in preventing falls. Exercise E: Single leg stand 1. Without shoes, stand near a railing or in a door frame. Hold on to the railing or door  frame as needed. 2. Stand on your __________ foot. Keep your big toe down on the floor and try to keep your arch lifted. 3. Hold this position for __________ seconds. Repeat __________ times. Complete this exercise __________ times per day. If this exercise is too easy, you can try it with your eyes closed or while standing on a pillow. This information is not intended to replace advice given to you by your health care provider. Make sure you discuss any questions you have with your health care provider. Document Released: 12/06/2004 Document Revised: 01/12/2016 Document Reviewed: 01/11/2015 Elsevier Interactive Patient Education  2018 [employer].   IF you received an x-ray today, you will receive an invoice from [facility]. Please contact [facility] at [phone] with questions or concerns regarding your invoice.   IF you received labwork today, you will receive an invoice from [facility]. Please contact [facility] at [phone] with questions or concerns regarding your invoice.   Our billing staff will not be able to assist you with questions regarding bills from these companies.  You will be contacted with the lab results as soon as they are available. The fastest way to get your results is to activate your My Chart account. Instructions are located on the last page of this paperwork. If you have not heard from [place] regarding the results in 2 weeks, please contact this office.

## 2017-08-08 NOTE — Progress Notes (Signed)
No show

## 2017-08-13 ENCOUNTER — Other Ambulatory Visit: Payer: Medicare Other

## 2017-08-13 ENCOUNTER — Ambulatory Visit: Payer: Medicare Other | Admitting: Oncology

## 2017-08-13 ENCOUNTER — Encounter: Payer: Self-pay | Admitting: Oncology

## 2017-08-20 ENCOUNTER — Encounter: Payer: Self-pay | Admitting: Physician Assistant

## 2017-08-21 ENCOUNTER — Emergency Department (HOSPITAL_COMMUNITY): Payer: Medicare Other

## 2017-08-21 ENCOUNTER — Encounter (HOSPITAL_COMMUNITY): Payer: Self-pay | Admitting: Emergency Medicine

## 2017-08-21 ENCOUNTER — Other Ambulatory Visit: Payer: Self-pay

## 2017-08-21 ENCOUNTER — Inpatient Hospital Stay (HOSPITAL_COMMUNITY)
Admission: EM | Admit: 2017-08-21 | Discharge: 2017-10-19 | DRG: 003 | Disposition: E | Payer: Medicare Other | Attending: Pulmonary Disease | Admitting: Pulmonary Disease

## 2017-08-21 DIAGNOSIS — D696 Thrombocytopenia, unspecified: Secondary | ICD-10-CM | POA: Diagnosis not present

## 2017-08-21 DIAGNOSIS — D735 Infarction of spleen: Secondary | ICD-10-CM | POA: Diagnosis not present

## 2017-08-21 DIAGNOSIS — Z7951 Long term (current) use of inhaled steroids: Secondary | ICD-10-CM

## 2017-08-21 DIAGNOSIS — Z992 Dependence on renal dialysis: Secondary | ICD-10-CM

## 2017-08-21 DIAGNOSIS — N2581 Secondary hyperparathyroidism of renal origin: Secondary | ICD-10-CM | POA: Diagnosis present

## 2017-08-21 DIAGNOSIS — B49 Unspecified mycosis: Secondary | ICD-10-CM | POA: Diagnosis present

## 2017-08-21 DIAGNOSIS — I160 Hypertensive urgency: Secondary | ICD-10-CM | POA: Diagnosis present

## 2017-08-21 DIAGNOSIS — I82C12 Acute embolism and thrombosis of left internal jugular vein: Secondary | ICD-10-CM | POA: Diagnosis not present

## 2017-08-21 DIAGNOSIS — R188 Other ascites: Secondary | ICD-10-CM

## 2017-08-21 DIAGNOSIS — Z90710 Acquired absence of both cervix and uterus: Secondary | ICD-10-CM

## 2017-08-21 DIAGNOSIS — Z9911 Dependence on respirator [ventilator] status: Secondary | ICD-10-CM

## 2017-08-21 DIAGNOSIS — Z515 Encounter for palliative care: Secondary | ICD-10-CM

## 2017-08-21 DIAGNOSIS — K653 Choleperitonitis: Secondary | ICD-10-CM

## 2017-08-21 DIAGNOSIS — R1084 Generalized abdominal pain: Secondary | ICD-10-CM

## 2017-08-21 DIAGNOSIS — K839 Disease of biliary tract, unspecified: Secondary | ICD-10-CM

## 2017-08-21 DIAGNOSIS — Z9889 Other specified postprocedural states: Secondary | ICD-10-CM

## 2017-08-21 DIAGNOSIS — Z6825 Body mass index (BMI) 25.0-25.9, adult: Secondary | ICD-10-CM

## 2017-08-21 DIAGNOSIS — Z853 Personal history of malignant neoplasm of breast: Secondary | ICD-10-CM

## 2017-08-21 DIAGNOSIS — T17908A Unspecified foreign body in respiratory tract, part unspecified causing other injury, initial encounter: Secondary | ICD-10-CM

## 2017-08-21 DIAGNOSIS — Z881 Allergy status to other antibiotic agents status: Secondary | ICD-10-CM

## 2017-08-21 DIAGNOSIS — M1990 Unspecified osteoarthritis, unspecified site: Secondary | ICD-10-CM | POA: Diagnosis present

## 2017-08-21 DIAGNOSIS — K8001 Calculus of gallbladder with acute cholecystitis with obstruction: Principal | ICD-10-CM | POA: Diagnosis present

## 2017-08-21 DIAGNOSIS — R6521 Severe sepsis with septic shock: Secondary | ICD-10-CM | POA: Diagnosis not present

## 2017-08-21 DIAGNOSIS — D62 Acute posthemorrhagic anemia: Secondary | ICD-10-CM | POA: Diagnosis not present

## 2017-08-21 DIAGNOSIS — Z430 Encounter for attention to tracheostomy: Secondary | ICD-10-CM

## 2017-08-21 DIAGNOSIS — R34 Anuria and oliguria: Secondary | ICD-10-CM | POA: Diagnosis not present

## 2017-08-21 DIAGNOSIS — K810 Acute cholecystitis: Secondary | ICD-10-CM

## 2017-08-21 DIAGNOSIS — L8990 Pressure ulcer of unspecified site, unspecified stage: Secondary | ICD-10-CM

## 2017-08-21 DIAGNOSIS — K219 Gastro-esophageal reflux disease without esophagitis: Secondary | ICD-10-CM | POA: Diagnosis present

## 2017-08-21 DIAGNOSIS — I272 Pulmonary hypertension, unspecified: Secondary | ICD-10-CM | POA: Diagnosis not present

## 2017-08-21 DIAGNOSIS — Z7189 Other specified counseling: Secondary | ICD-10-CM

## 2017-08-21 DIAGNOSIS — Z809 Family history of malignant neoplasm, unspecified: Secondary | ICD-10-CM

## 2017-08-21 DIAGNOSIS — D631 Anemia in chronic kidney disease: Secondary | ICD-10-CM | POA: Diagnosis present

## 2017-08-21 DIAGNOSIS — J80 Acute respiratory distress syndrome: Secondary | ICD-10-CM | POA: Diagnosis not present

## 2017-08-21 DIAGNOSIS — Z66 Do not resuscitate: Secondary | ICD-10-CM | POA: Diagnosis not present

## 2017-08-21 DIAGNOSIS — N186 End stage renal disease: Secondary | ICD-10-CM

## 2017-08-21 DIAGNOSIS — I214 Non-ST elevation (NSTEMI) myocardial infarction: Secondary | ICD-10-CM

## 2017-08-21 DIAGNOSIS — E43 Unspecified severe protein-calorie malnutrition: Secondary | ICD-10-CM | POA: Insufficient documentation

## 2017-08-21 DIAGNOSIS — E162 Hypoglycemia, unspecified: Secondary | ICD-10-CM | POA: Diagnosis not present

## 2017-08-21 DIAGNOSIS — K838 Other specified diseases of biliary tract: Secondary | ICD-10-CM

## 2017-08-21 DIAGNOSIS — N179 Acute kidney failure, unspecified: Secondary | ICD-10-CM | POA: Diagnosis not present

## 2017-08-21 DIAGNOSIS — E440 Moderate protein-calorie malnutrition: Secondary | ICD-10-CM

## 2017-08-21 DIAGNOSIS — I700 Atherosclerosis of aorta: Secondary | ICD-10-CM | POA: Diagnosis present

## 2017-08-21 DIAGNOSIS — J9601 Acute respiratory failure with hypoxia: Secondary | ICD-10-CM

## 2017-08-21 DIAGNOSIS — J9600 Acute respiratory failure, unspecified whether with hypoxia or hypercapnia: Secondary | ICD-10-CM

## 2017-08-21 DIAGNOSIS — Z4659 Encounter for fitting and adjustment of other gastrointestinal appliance and device: Secondary | ICD-10-CM

## 2017-08-21 DIAGNOSIS — R1031 Right lower quadrant pain: Secondary | ICD-10-CM

## 2017-08-21 DIAGNOSIS — Z9289 Personal history of other medical treatment: Secondary | ICD-10-CM

## 2017-08-21 DIAGNOSIS — I4510 Unspecified right bundle-branch block: Secondary | ICD-10-CM | POA: Diagnosis present

## 2017-08-21 DIAGNOSIS — K559 Vascular disorder of intestine, unspecified: Secondary | ICD-10-CM | POA: Diagnosis not present

## 2017-08-21 DIAGNOSIS — I21A1 Myocardial infarction type 2: Secondary | ICD-10-CM | POA: Diagnosis not present

## 2017-08-21 DIAGNOSIS — I10 Essential (primary) hypertension: Secondary | ICD-10-CM | POA: Diagnosis present

## 2017-08-21 DIAGNOSIS — D72823 Leukemoid reaction: Secondary | ICD-10-CM | POA: Diagnosis not present

## 2017-08-21 DIAGNOSIS — D689 Coagulation defect, unspecified: Secondary | ICD-10-CM

## 2017-08-21 DIAGNOSIS — Z885 Allergy status to narcotic agent status: Secondary | ICD-10-CM

## 2017-08-21 DIAGNOSIS — E874 Mixed disorder of acid-base balance: Secondary | ICD-10-CM | POA: Diagnosis not present

## 2017-08-21 DIAGNOSIS — I82622 Acute embolism and thrombosis of deep veins of left upper extremity: Secondary | ICD-10-CM | POA: Diagnosis not present

## 2017-08-21 DIAGNOSIS — K567 Ileus, unspecified: Secondary | ICD-10-CM | POA: Diagnosis not present

## 2017-08-21 DIAGNOSIS — K660 Peritoneal adhesions (postprocedural) (postinfection): Secondary | ICD-10-CM | POA: Diagnosis present

## 2017-08-21 DIAGNOSIS — E872 Acidosis: Secondary | ICD-10-CM | POA: Diagnosis not present

## 2017-08-21 DIAGNOSIS — Z452 Encounter for adjustment and management of vascular access device: Secondary | ICD-10-CM

## 2017-08-21 DIAGNOSIS — Z789 Other specified health status: Secondary | ICD-10-CM

## 2017-08-21 DIAGNOSIS — K922 Gastrointestinal hemorrhage, unspecified: Secondary | ICD-10-CM

## 2017-08-21 DIAGNOSIS — K9189 Other postprocedural complications and disorders of digestive system: Secondary | ICD-10-CM

## 2017-08-21 DIAGNOSIS — Z930 Tracheostomy status: Secondary | ICD-10-CM

## 2017-08-21 DIAGNOSIS — Z978 Presence of other specified devices: Secondary | ICD-10-CM

## 2017-08-21 DIAGNOSIS — K651 Peritoneal abscess: Secondary | ICD-10-CM | POA: Diagnosis not present

## 2017-08-21 DIAGNOSIS — L02211 Cutaneous abscess of abdominal wall: Secondary | ICD-10-CM

## 2017-08-21 DIAGNOSIS — E861 Hypovolemia: Secondary | ICD-10-CM | POA: Diagnosis not present

## 2017-08-21 DIAGNOSIS — H5702 Anisocoria: Secondary | ICD-10-CM | POA: Diagnosis not present

## 2017-08-21 DIAGNOSIS — Y838 Other surgical procedures as the cause of abnormal reaction of the patient, or of later complication, without mention of misadventure at the time of the procedure: Secondary | ICD-10-CM | POA: Diagnosis not present

## 2017-08-21 DIAGNOSIS — K7460 Unspecified cirrhosis of liver: Secondary | ICD-10-CM | POA: Diagnosis not present

## 2017-08-21 DIAGNOSIS — R1115 Cyclical vomiting syndrome unrelated to migraine: Secondary | ICD-10-CM

## 2017-08-21 DIAGNOSIS — R680 Hypothermia, not associated with low environmental temperature: Secondary | ICD-10-CM | POA: Diagnosis not present

## 2017-08-21 DIAGNOSIS — E876 Hypokalemia: Secondary | ICD-10-CM | POA: Diagnosis present

## 2017-08-21 DIAGNOSIS — E2740 Unspecified adrenocortical insufficiency: Secondary | ICD-10-CM | POA: Diagnosis not present

## 2017-08-21 DIAGNOSIS — I120 Hypertensive chronic kidney disease with stage 5 chronic kidney disease or end stage renal disease: Secondary | ICD-10-CM | POA: Diagnosis present

## 2017-08-21 DIAGNOSIS — E875 Hyperkalemia: Secondary | ICD-10-CM | POA: Diagnosis not present

## 2017-08-21 DIAGNOSIS — Z9012 Acquired absence of left breast and nipple: Secondary | ICD-10-CM

## 2017-08-21 DIAGNOSIS — K2960 Other gastritis without bleeding: Secondary | ICD-10-CM | POA: Diagnosis not present

## 2017-08-21 DIAGNOSIS — K7200 Acute and subacute hepatic failure without coma: Secondary | ICD-10-CM

## 2017-08-21 DIAGNOSIS — J690 Pneumonitis due to inhalation of food and vomit: Secondary | ICD-10-CM | POA: Diagnosis not present

## 2017-08-21 DIAGNOSIS — A419 Sepsis, unspecified organism: Secondary | ICD-10-CM

## 2017-08-21 LAB — CBC WITH DIFFERENTIAL/PLATELET
BASOS ABS: 0 10*3/uL (ref 0.0–0.1)
BASOS PCT: 0 %
EOS ABS: 0 10*3/uL (ref 0.0–0.7)
EOS PCT: 0 %
HCT: 33.5 % — ABNORMAL LOW (ref 36.0–46.0)
Hemoglobin: 10.7 g/dL — ABNORMAL LOW (ref 12.0–15.0)
LYMPHS PCT: 5 %
Lymphs Abs: 0.6 10*3/uL — ABNORMAL LOW (ref 0.7–4.0)
MCH: 29.7 pg (ref 26.0–34.0)
MCHC: 31.9 g/dL (ref 30.0–36.0)
MCV: 93.1 fL (ref 78.0–100.0)
MONO ABS: 0.2 10*3/uL (ref 0.1–1.0)
Monocytes Relative: 2 %
Neutro Abs: 10.9 10*3/uL — ABNORMAL HIGH (ref 1.7–7.7)
Neutrophils Relative %: 93 %
PLATELETS: 216 10*3/uL (ref 150–400)
RBC: 3.6 MIL/uL — AB (ref 3.87–5.11)
RDW: 15 % (ref 11.5–15.5)
WBC: 11.8 10*3/uL — AB (ref 4.0–10.5)

## 2017-08-21 LAB — URINALYSIS, ROUTINE W REFLEX MICROSCOPIC
BILIRUBIN URINE: NEGATIVE
Bacteria, UA: NONE SEEN
GLUCOSE, UA: 150 mg/dL — AB
KETONES UR: NEGATIVE mg/dL
LEUKOCYTES UA: NEGATIVE
Nitrite: NEGATIVE
PROTEIN: 30 mg/dL — AB
Specific Gravity, Urine: 1.006 (ref 1.005–1.030)
pH: 9 — ABNORMAL HIGH (ref 5.0–8.0)

## 2017-08-21 LAB — COMPREHENSIVE METABOLIC PANEL
ALT: 12 U/L — AB (ref 14–54)
AST: 23 U/L (ref 15–41)
Albumin: 3.6 g/dL (ref 3.5–5.0)
Alkaline Phosphatase: 66 U/L (ref 38–126)
Anion gap: 9 (ref 5–15)
BUN: 9 mg/dL (ref 6–20)
CHLORIDE: 100 mmol/L — AB (ref 101–111)
CO2: 27 mmol/L (ref 22–32)
Calcium: 7.8 mg/dL — ABNORMAL LOW (ref 8.9–10.3)
Creatinine, Ser: 4.43 mg/dL — ABNORMAL HIGH (ref 0.44–1.00)
GFR calc Af Amer: 10 mL/min — ABNORMAL LOW (ref >60)
GFR, EST NON AFRICAN AMERICAN: 8 mL/min — AB (ref >60)
Glucose, Bld: 155 mg/dL — ABNORMAL HIGH (ref 65–99)
POTASSIUM: 2.8 mmol/L — AB (ref 3.5–5.1)
SODIUM: 136 mmol/L (ref 135–145)
Total Bilirubin: 1 mg/dL (ref 0.3–1.2)
Total Protein: 7.3 g/dL (ref 6.5–8.1)

## 2017-08-21 LAB — LIPASE, BLOOD: LIPASE: 36 U/L (ref 11–51)

## 2017-08-21 MED ORDER — FENTANYL CITRATE (PF) 100 MCG/2ML IJ SOLN
50.0000 ug | Freq: Once | INTRAMUSCULAR | Status: AC
  Administered 2017-08-21: 50 ug via INTRAVENOUS
  Filled 2017-08-21: qty 2

## 2017-08-21 MED ORDER — POTASSIUM CHLORIDE CRYS ER 20 MEQ PO TBCR
20.0000 meq | EXTENDED_RELEASE_TABLET | Freq: Once | ORAL | Status: AC
  Administered 2017-08-21: 20 meq via ORAL
  Filled 2017-08-21: qty 1

## 2017-08-21 MED ORDER — ONDANSETRON HCL 4 MG/2ML IJ SOLN
4.0000 mg | Freq: Once | INTRAMUSCULAR | Status: AC
  Administered 2017-08-21: 4 mg via INTRAVENOUS
  Filled 2017-08-21: qty 2

## 2017-08-21 MED ORDER — IOPAMIDOL (ISOVUE-300) INJECTION 61%
INTRAVENOUS | Status: AC
  Filled 2017-08-21: qty 30

## 2017-08-21 NOTE — ED Notes (Signed)
This EMT attempted urine collection from pt. Pt stated she would not provide. Rn informed

## 2017-08-21 NOTE — ED Provider Notes (Signed)
[facility] EMERGENCY DEPARTMENT Provider Note   CSN: [id number] Arrival date & time: 08/29/2017  1607     History   Chief Complaint Chief Complaint  Patient presents with  . Abdominal Pain    HPI Pam Fuller is a 84 y.o. female.  HPI  85 year old female with a history of end-stage renal disease on dialysis and hypertension presents with abdominal pain and vomiting.  Started this morning when she awoke.  Feels like it starts in her umbilicus and goes to her right lower quadrant.  The pain is constant and dull aching/throbbing.  Pain is progressively worsening and a 7/10.  No back pain.  She had multiple episodes of emesis.  Denies constipation or diarrhea.  She denies urinary symptoms but only urinates about once per day.  She could only tolerate a little less than 2 hours of dialysis today.  Past Medical History:  Diagnosis Date  . Arthritis   . Blood transfusion without reported diagnosis   . Cancer [facility])    patient states she had br ca 13 years ago  . ESRD (end stage renal disease) on dialysis ([place])   . Hypertension   . Renal disorder    patient on dialysis  . Wears glasses     Patient Active Problem List   Diagnosis Date Noted  . Accelerated hypertension   . Hyperkalemia   . Syncope 09/14/2015  . Malignant neoplasm of upper-outer quadrant of left breast in female, estrogen receptor positive ([place]) 04/07/2013  . ESRD on dialysis ([place]) 04/07/2013  . Unspecified essential hypertension 04/07/2013  . Gout 04/07/2013  . GERD (gastroesophageal reflux disease) 04/07/2013    Past Surgical History:  Procedure Laterality Date  . ABDOMINAL HYSTERECTOMY    . APPENDECTOMY    . BREAST LUMPECTOMY WITH AXILLARY LYMPH NODE BIOPSY  2001   left  . BREAST SURGERY    . COLONOSCOPY    . PARTIAL MASTECTOMY WITH NEEDLE LOCALIZATION Left 06/18/2013   Procedure: PARTIAL MASTECTOMY WITH NEEDLE LOCALIZATION;  Surgeon: Adin Hector, MD;  Location: [facility];  Service: General;  Laterality: Left;  . TONSILLECTOMY       OB History   None      Home Medications    Prior to Admission medications   Medication Sig Start Date End Date Taking? Authorizing Provider  amLODipine (NORVASC) 10 MG tablet Take 10 mg by mouth daily.  03/31/13   [provider]  calcium acetate (PHOSLO) 667 MG capsule Take 2 capsules (1,334 mg total) by mouth 3 (three) times daily with meals. 09/15/15   Dana Allan I, MD  cloNIDine (CATAPRES) 0.1 MG tablet Take 0.1 mg by mouth 2 (two) times daily.  03/24/13   [provider]  fluticasone (FLONASE) 50 MCG/ACT nasal spray Place 2 sprays into both nostrils daily. 09/15/15   Dana Allan I, MD  hydrALAZINE (APRESOLINE) 50 MG tablet Take 50 mg by mouth daily.  04/14/13   [provider]  hydrOXYzine (ATARAX/VISTARIL) 25 MG tablet Take 1 tablet (25 mg total) by mouth every 8 (eight) hours as needed for itching. 09/15/15   Dana Allan I, MD  multivitamin (RENA-VIT) TABS tablet Take 1 tablet by mouth at bedtime. 09/15/15   Bonnell Public, MD  tamoxifen (NOLVADEX) 20 MG tablet Take 1 tablet (20 mg total) by mouth daily. 02/12/17   Gardenia Phlegm, NP    Family History Family History  Problem Relation Age of Onset  . Cancer Maternal  Aunt        breast    Social History Social History   Tobacco Use  . Smoking status: Never Smoker  . Smokeless tobacco: Never Used  Substance Use Topics  . Alcohol use: No  . Drug use: No     Allergies   Erythromycin and Vicodin [hydrocodone-acetaminophen]   Review of Systems Review of Systems  Constitutional: Negative for fever.  Cardiovascular: Negative for chest pain.  Gastrointestinal: Positive for abdominal pain, nausea and vomiting. Negative for constipation and diarrhea.  Genitourinary: Negative for dysuria.  Musculoskeletal: Negative for back pain.  All other systems reviewed and are  negative.    Physical Exam Updated Vital Signs BP (!) 203/84   Pulse 86   Temp 99.2 F (37.3 C) (Oral)   Resp 17   SpO2 100%   Physical Exam  Constitutional: She is oriented to person, place, and time. She appears well-developed and well-nourished.  Non-toxic appearance. She does not appear ill. No distress.  HENT:  Head: Normocephalic and atraumatic.  Right Ear: External ear normal.  Left Ear: External ear normal.  Nose: Nose normal.  Eyes: Right eye exhibits no discharge. Left eye exhibits no discharge.  Cardiovascular: Normal rate, regular rhythm and normal heart sounds.  Pulmonary/Chest: Effort normal and breath sounds normal.  Abdominal: Soft. There is tenderness in the right lower quadrant. There is no rigidity.  Neurological: She is alert and oriented to person, place, and time.  Skin: Skin is warm and dry.  Nursing note and vitals reviewed.    ED Treatments / Results  Labs (all labs ordered are listed, but only abnormal results are displayed) Labs Reviewed  CBC WITH DIFFERENTIAL/PLATELET - Abnormal; Notable for the following components:      Result Value   WBC 11.8 (*)    RBC 3.60 (*)    Hemoglobin 10.7 (*)    HCT 33.5 (*)    Neutro Abs 10.9 (*)    Lymphs Abs 0.6 (*)    All other components within normal limits  COMPREHENSIVE METABOLIC PANEL - Abnormal; Notable for the following components:   Potassium 2.8 (*)    Chloride 100 (*)    Glucose, Bld 155 (*)    Creatinine, Ser 4.43 (*)    Calcium 7.8 (*)    ALT 12 (*)    GFR calc non Af Amer 8 (*)    GFR calc Af Amer 10 (*)    All other components within normal limits  LIPASE, BLOOD  URINALYSIS, ROUTINE W REFLEX MICROSCOPIC    EKG EKG Interpretation  Date/Time:  Wednesday August 21 2017 21:11:30 EDT Ventricular Rate:  83 PR Interval:    QRS Duration: 140 QT Interval:  478 QTC Calculation: 562 R Axis:   -41 Text Interpretation:  Sinus rhythm Atrial premature complex RBBB and LAFB Probable left  ventricular hypertrophy Lateral infarct, age indeterminate no significant change since April 2017 Confirmed by Sherwood Gambler [phone]) on 08/19/2017 9:38:09 PM   Radiology No results found.  Procedures Procedures (including critical care time)  Medications Ordered in ED Medications  iopamidol (ISOVUE-300) 61 % injection (has no administration in time range)  fentaNYL (SUBLIMAZE) injection 50 mcg (50 mcg Intravenous Given 09/09/2017 2205)  ondansetron (ZOFRAN) injection 4 mg (4 mg Intravenous Given 08/19/2017 2204)  potassium chloride SA (K-DUR,KLOR-CON) CR tablet 20 mEq (20 mEq Oral Given 09/11/2017 2209)     Initial Impression / Assessment and Plan / ED Course  I have reviewed the triage vital signs and  the nursing notes.  Pertinent labs & imaging results that were available during my care of the patient were reviewed by me and considered in my medical decision making (see chart for details).     Given the focal right lower quadrant abdominal pain with mildly elevated WBC, CT will be ordered to help evaluate her abdominal pain and vomiting.  No current vomiting.  She will be given Zofran.  Fentanyl for pain.  Small dose of oral potassium given her renal failure but also significant hypokalemia which is probably from vomiting.  CT currently pending wound care transferred to [facility].  Final Clinical Impressions(s) / ED Diagnoses   Final diagnoses:  RLQ abdominal pain  Hypokalemia    ED Discharge Orders    None       Sherwood Gambler, MD 09/08/2017 2226

## 2017-08-21 NOTE — ED Notes (Signed)
Patient transported to CT 

## 2017-08-21 NOTE — ED Provider Notes (Signed)
Signout at shift change from Dr. Regenia Skeeter See previous providers note for full H&P  Briefly, patient awoke this morning with right lower quadrant pain.  Patient describes her pain is constant, dull, aching, and throbbing.  She feels like it starts in her umbilicus and goes to her right lower quadrant.  She is status post appendectomy.  She has had multiple episodes of vomiting, but no constipation or diarrhea.  She denies any urinary symptoms, but only urinates once a day.  Patient is dialysis patient and can only tolerate a little less than 2 hours of dialysis today.  Plan for urine and CT.  Patient does have hypokalemia, which was replaced in the ED.  Mild leukocytosis, 11.8.  If urine and CT negative, plan for discharge home.  CT abdomen pelvis shows probably distended gallbladder with a gallstone lodged in the gallbladder neck.  Mild gallbladder wall thickening and surrounding pericholecystic fluid, findings concerning for acute cholecystitis; mild dilatation of the common bile duct measuring up to 10 mm, no obstructing mass or stone seen.  Zosyn initiated.  I consulted general surgery and spoke with Dr. Dema Severin who will evaluate the patient.  He advises admission to medicine.  I spoke with Dr. Hal Hope with [facility] who will admit the patient.  I also consulted nephrology and spoke with Dr. Joelyn Oms, who will manage the patient's dialysis needs during admission.   Frederica Kuster, PA-C 08/22/17 1937    Sherwood Gambler, MD 08/22/17 [phone]

## 2017-08-21 NOTE — ED Notes (Signed)
Pt has completed drinking both bottles of contrast.

## 2017-08-21 NOTE — ED Notes (Signed)
Attempted IV start without success.

## 2017-08-21 NOTE — ED Provider Notes (Signed)
Patient placed in Quick Look pathway, seen and evaluated    Chief Complaint: RLQ abd pain  HPI:   Pt presenting via EMS from dialysis for dull RLQ abd pain that began this morning upon awakening. Pt states she had episodes of N/V this morning. She was 2 hours in to dialysis when EMS was called for abd pain. Pt took tylenol and benadryl for pain without relief. Last BM was yesterday and normal. Denies urinary sx, F/C, diarrhea, constipation. Abdominal surgeries include appendectomy, hysterectomy.  ROS: + abd pain, +nausea, + vomiting, - fever  Physical Exam:   Gen: No distress  Neuro: Awake and Alert  Skin: Warm    Focused Exam: Abdomen is soft. Right sided abd tenderness in mid abdomen. No guarding or rebound.  Initiation of care has begun. The patient has been counseled on the process, plan, and necessity for staying for the completion/evaluation, and the remainder of the medical screening examination.    Lissandro Dilorenzo, [place] N, PA-C 09/14/2017 1622    Little, Wenda Overland, MD 08/22/17 [phone]

## 2017-08-21 NOTE — ED Triage Notes (Signed)
Arrived [facility] from dialysis where she was 2 hours into her treatment and reported worsening RLQ pain. Pt reports pain onset this morning prior to dialysis, and N/V but hasn't vomited since before dialysis. Vitals with EMS  BP134/80 P82 [id number]

## 2017-08-22 ENCOUNTER — Encounter (HOSPITAL_COMMUNITY): Payer: Self-pay | Admitting: Internal Medicine

## 2017-08-22 ENCOUNTER — Other Ambulatory Visit: Payer: Self-pay

## 2017-08-22 DIAGNOSIS — A419 Sepsis, unspecified organism: Secondary | ICD-10-CM | POA: Diagnosis not present

## 2017-08-22 DIAGNOSIS — L02211 Cutaneous abscess of abdominal wall: Secondary | ICD-10-CM | POA: Diagnosis not present

## 2017-08-22 DIAGNOSIS — K810 Acute cholecystitis: Secondary | ICD-10-CM | POA: Diagnosis not present

## 2017-08-22 DIAGNOSIS — K839 Disease of biliary tract, unspecified: Secondary | ICD-10-CM | POA: Diagnosis not present

## 2017-08-22 DIAGNOSIS — K8001 Calculus of gallbladder with acute cholecystitis with obstruction: Secondary | ICD-10-CM | POA: Diagnosis not present

## 2017-08-22 DIAGNOSIS — K9189 Other postprocedural complications and disorders of digestive system: Secondary | ICD-10-CM | POA: Diagnosis not present

## 2017-08-22 DIAGNOSIS — K653 Choleperitonitis: Secondary | ICD-10-CM | POA: Diagnosis not present

## 2017-08-22 DIAGNOSIS — T17908A Unspecified foreign body in respiratory tract, part unspecified causing other injury, initial encounter: Secondary | ICD-10-CM | POA: Diagnosis not present

## 2017-08-22 DIAGNOSIS — J690 Pneumonitis due to inhalation of food and vomit: Secondary | ICD-10-CM | POA: Diagnosis not present

## 2017-08-22 DIAGNOSIS — I120 Hypertensive chronic kidney disease with stage 5 chronic kidney disease or end stage renal disease: Secondary | ICD-10-CM | POA: Diagnosis not present

## 2017-08-22 DIAGNOSIS — R001 Bradycardia, unspecified: Secondary | ICD-10-CM | POA: Diagnosis not present

## 2017-08-22 DIAGNOSIS — Y838 Other surgical procedures as the cause of abnormal reaction of the patient, or of later complication, without mention of misadventure at the time of the procedure: Secondary | ICD-10-CM | POA: Diagnosis not present

## 2017-08-22 DIAGNOSIS — J9601 Acute respiratory failure with hypoxia: Secondary | ICD-10-CM | POA: Diagnosis not present

## 2017-08-22 DIAGNOSIS — N2581 Secondary hyperparathyroidism of renal origin: Secondary | ICD-10-CM | POA: Diagnosis present

## 2017-08-22 DIAGNOSIS — K2970 Gastritis, unspecified, without bleeding: Secondary | ICD-10-CM | POA: Diagnosis not present

## 2017-08-22 DIAGNOSIS — I10 Essential (primary) hypertension: Secondary | ICD-10-CM | POA: Diagnosis not present

## 2017-08-22 DIAGNOSIS — R17 Unspecified jaundice: Secondary | ICD-10-CM | POA: Diagnosis not present

## 2017-08-22 DIAGNOSIS — K651 Peritoneal abscess: Secondary | ICD-10-CM | POA: Diagnosis not present

## 2017-08-22 DIAGNOSIS — D62 Acute posthemorrhagic anemia: Secondary | ICD-10-CM | POA: Diagnosis not present

## 2017-08-22 DIAGNOSIS — E43 Unspecified severe protein-calorie malnutrition: Secondary | ICD-10-CM | POA: Diagnosis not present

## 2017-08-22 DIAGNOSIS — Z885 Allergy status to narcotic agent status: Secondary | ICD-10-CM | POA: Diagnosis not present

## 2017-08-22 DIAGNOSIS — K7200 Acute and subacute hepatic failure without coma: Secondary | ICD-10-CM | POA: Diagnosis not present

## 2017-08-22 DIAGNOSIS — Z881 Allergy status to other antibiotic agents status: Secondary | ICD-10-CM | POA: Diagnosis not present

## 2017-08-22 DIAGNOSIS — Z66 Do not resuscitate: Secondary | ICD-10-CM | POA: Diagnosis not present

## 2017-08-22 DIAGNOSIS — B3789 Other sites of candidiasis: Secondary | ICD-10-CM | POA: Diagnosis not present

## 2017-08-22 DIAGNOSIS — Z978 Presence of other specified devices: Secondary | ICD-10-CM | POA: Diagnosis not present

## 2017-08-22 DIAGNOSIS — K922 Gastrointestinal hemorrhage, unspecified: Secondary | ICD-10-CM | POA: Diagnosis not present

## 2017-08-22 DIAGNOSIS — Z515 Encounter for palliative care: Secondary | ICD-10-CM | POA: Diagnosis not present

## 2017-08-22 DIAGNOSIS — I214 Non-ST elevation (NSTEMI) myocardial infarction: Secondary | ICD-10-CM | POA: Diagnosis not present

## 2017-08-22 DIAGNOSIS — I248 Other forms of acute ischemic heart disease: Secondary | ICD-10-CM | POA: Diagnosis not present

## 2017-08-22 DIAGNOSIS — Z930 Tracheostomy status: Secondary | ICD-10-CM | POA: Diagnosis not present

## 2017-08-22 DIAGNOSIS — R0600 Dyspnea, unspecified: Secondary | ICD-10-CM | POA: Diagnosis not present

## 2017-08-22 DIAGNOSIS — D689 Coagulation defect, unspecified: Secondary | ICD-10-CM | POA: Diagnosis not present

## 2017-08-22 DIAGNOSIS — N179 Acute kidney failure, unspecified: Secondary | ICD-10-CM | POA: Diagnosis not present

## 2017-08-22 DIAGNOSIS — T17908S Unspecified foreign body in respiratory tract, part unspecified causing other injury, sequela: Secondary | ICD-10-CM | POA: Diagnosis not present

## 2017-08-22 DIAGNOSIS — E872 Acidosis: Secondary | ICD-10-CM | POA: Diagnosis not present

## 2017-08-22 DIAGNOSIS — E2740 Unspecified adrenocortical insufficiency: Secondary | ICD-10-CM | POA: Diagnosis not present

## 2017-08-22 DIAGNOSIS — K559 Vascular disorder of intestine, unspecified: Secondary | ICD-10-CM | POA: Diagnosis not present

## 2017-08-22 DIAGNOSIS — R6521 Severe sepsis with septic shock: Secondary | ICD-10-CM | POA: Diagnosis not present

## 2017-08-22 DIAGNOSIS — Z7189 Other specified counseling: Secondary | ICD-10-CM | POA: Diagnosis not present

## 2017-08-22 DIAGNOSIS — N186 End stage renal disease: Secondary | ICD-10-CM | POA: Diagnosis not present

## 2017-08-22 DIAGNOSIS — I959 Hypotension, unspecified: Secondary | ICD-10-CM | POA: Diagnosis not present

## 2017-08-22 DIAGNOSIS — I5030 Unspecified diastolic (congestive) heart failure: Secondary | ICD-10-CM | POA: Diagnosis not present

## 2017-08-22 DIAGNOSIS — R601 Generalized edema: Secondary | ICD-10-CM | POA: Diagnosis not present

## 2017-08-22 DIAGNOSIS — K7460 Unspecified cirrhosis of liver: Secondary | ICD-10-CM | POA: Diagnosis not present

## 2017-08-22 DIAGNOSIS — I21A1 Myocardial infarction type 2: Secondary | ICD-10-CM | POA: Diagnosis not present

## 2017-08-22 DIAGNOSIS — Z9911 Dependence on respirator [ventilator] status: Secondary | ICD-10-CM | POA: Diagnosis not present

## 2017-08-22 DIAGNOSIS — R188 Other ascites: Secondary | ICD-10-CM | POA: Diagnosis not present

## 2017-08-22 DIAGNOSIS — K838 Other specified diseases of biliary tract: Secondary | ICD-10-CM | POA: Diagnosis not present

## 2017-08-22 DIAGNOSIS — I82C12 Acute embolism and thrombosis of left internal jugular vein: Secondary | ICD-10-CM | POA: Diagnosis not present

## 2017-08-22 DIAGNOSIS — K567 Ileus, unspecified: Secondary | ICD-10-CM | POA: Diagnosis not present

## 2017-08-22 DIAGNOSIS — K625 Hemorrhage of anus and rectum: Secondary | ICD-10-CM | POA: Diagnosis not present

## 2017-08-22 DIAGNOSIS — D696 Thrombocytopenia, unspecified: Secondary | ICD-10-CM | POA: Diagnosis not present

## 2017-08-22 DIAGNOSIS — Z992 Dependence on renal dialysis: Secondary | ICD-10-CM | POA: Diagnosis not present

## 2017-08-22 DIAGNOSIS — R1084 Generalized abdominal pain: Secondary | ICD-10-CM | POA: Diagnosis not present

## 2017-08-22 DIAGNOSIS — R945 Abnormal results of liver function studies: Secondary | ICD-10-CM | POA: Diagnosis not present

## 2017-08-22 DIAGNOSIS — J80 Acute respiratory distress syndrome: Secondary | ICD-10-CM | POA: Diagnosis not present

## 2017-08-22 DIAGNOSIS — Z9889 Other specified postprocedural states: Secondary | ICD-10-CM | POA: Diagnosis not present

## 2017-08-22 DIAGNOSIS — R1031 Right lower quadrant pain: Secondary | ICD-10-CM | POA: Diagnosis present

## 2017-08-22 DIAGNOSIS — E440 Moderate protein-calorie malnutrition: Secondary | ICD-10-CM | POA: Diagnosis not present

## 2017-08-22 DIAGNOSIS — R609 Edema, unspecified: Secondary | ICD-10-CM | POA: Diagnosis not present

## 2017-08-22 LAB — BASIC METABOLIC PANEL
ANION GAP: 15 (ref 5–15)
BUN: 18 mg/dL (ref 6–20)
CHLORIDE: 93 mmol/L — AB (ref 101–111)
CO2: 31 mmol/L (ref 22–32)
Calcium: 8.8 mg/dL — ABNORMAL LOW (ref 8.9–10.3)
Creatinine, Ser: 5.55 mg/dL — ABNORMAL HIGH (ref 0.44–1.00)
GFR calc non Af Amer: 6 mL/min — ABNORMAL LOW (ref >60)
GFR, EST AFRICAN AMERICAN: 7 mL/min — AB (ref >60)
Glucose, Bld: 97 mg/dL (ref 65–99)
POTASSIUM: 3.7 mmol/L (ref 3.5–5.1)
SODIUM: 139 mmol/L (ref 135–145)

## 2017-08-22 LAB — CBC
HCT: 32.8 % — ABNORMAL LOW (ref 36.0–46.0)
HEMOGLOBIN: 10.5 g/dL — AB (ref 12.0–15.0)
MCH: 29.8 pg (ref 26.0–34.0)
MCHC: 32 g/dL (ref 30.0–36.0)
MCV: 93.2 fL (ref 78.0–100.0)
Platelets: 199 10*3/uL (ref 150–400)
RBC: 3.52 MIL/uL — AB (ref 3.87–5.11)
RDW: 15.2 % (ref 11.5–15.5)
WBC: 14.9 10*3/uL — AB (ref 4.0–10.5)

## 2017-08-22 LAB — SURGICAL PCR SCREEN
MRSA, PCR: NEGATIVE
Staphylococcus aureus: NEGATIVE

## 2017-08-22 LAB — GLUCOSE, CAPILLARY
GLUCOSE-CAPILLARY: 101 mg/dL — AB (ref 65–99)
GLUCOSE-CAPILLARY: 92 mg/dL (ref 65–99)

## 2017-08-22 MED ORDER — FENTANYL CITRATE (PF) 100 MCG/2ML IJ SOLN
INTRAMUSCULAR | Status: AC
  Filled 2017-08-22: qty 2

## 2017-08-22 MED ORDER — ONDANSETRON HCL 4 MG/2ML IJ SOLN
4.0000 mg | Freq: Four times a day (QID) | INTRAMUSCULAR | Status: DC | PRN
  Administered 2017-08-24 – 2017-08-29 (×5): 4 mg via INTRAVENOUS
  Filled 2017-08-22 (×5): qty 2

## 2017-08-22 MED ORDER — CLONIDINE HCL 0.1 MG/24HR TD PTWK
0.1000 mg | MEDICATED_PATCH | TRANSDERMAL | Status: DC
Start: 2017-08-22 — End: 2017-08-28
  Administered 2017-08-22 – 2017-08-28 (×2): 0.1 mg via TRANSDERMAL
  Filled 2017-08-22 (×2): qty 1

## 2017-08-22 MED ORDER — AMLODIPINE BESYLATE 5 MG PO TABS
10.0000 mg | ORAL_TABLET | Freq: Once | ORAL | Status: AC
  Administered 2017-08-22: 10 mg via ORAL
  Filled 2017-08-22: qty 2

## 2017-08-22 MED ORDER — MORPHINE SULFATE (PF) 4 MG/ML IV SOLN
1.0000 mg | INTRAVENOUS | Status: DC | PRN
  Administered 2017-08-22 – 2017-08-24 (×9): 1 mg via INTRAVENOUS
  Filled 2017-08-22 (×9): qty 1

## 2017-08-22 MED ORDER — HYDRALAZINE HCL 25 MG PO TABS
50.0000 mg | ORAL_TABLET | Freq: Once | ORAL | Status: AC
  Administered 2017-08-22: 50 mg via ORAL
  Filled 2017-08-22: qty 2

## 2017-08-22 MED ORDER — HYDRALAZINE HCL 20 MG/ML IJ SOLN
10.0000 mg | INTRAMUSCULAR | Status: DC | PRN
  Administered 2017-08-26: 10 mg via INTRAVENOUS
  Filled 2017-08-22: qty 1

## 2017-08-22 MED ORDER — ONDANSETRON HCL 4 MG PO TABS: 4.0000 mg | ORAL_TABLET | Freq: Four times a day (QID) | ORAL | Status: DC | PRN

## 2017-08-22 MED ORDER — PIPERACILLIN-TAZOBACTAM 3.375 G IVPB
3.3750 g | Freq: Once | INTRAVENOUS | Status: AC
  Administered 2017-08-22: 3.375 g via INTRAVENOUS
  Filled 2017-08-22: qty 50

## 2017-08-22 MED ORDER — FENTANYL CITRATE (PF) 100 MCG/2ML IJ SOLN
50.0000 ug | Freq: Once | INTRAMUSCULAR | Status: AC
  Administered 2017-08-22: 50 ug via INTRAVENOUS

## 2017-08-22 MED ORDER — PIPERACILLIN-TAZOBACTAM 3.375 G IVPB
3.3750 g | Freq: Two times a day (BID) | INTRAVENOUS | Status: DC
  Administered 2017-08-22 – 2017-08-23 (×4): 3.375 g via INTRAVENOUS
  Filled 2017-08-22 (×5): qty 50

## 2017-08-22 MED ORDER — CLONIDINE HCL 0.1 MG PO TABS
0.1000 mg | ORAL_TABLET | Freq: Once | ORAL | Status: AC
  Administered 2017-08-22: 0.1 mg via ORAL
  Filled 2017-08-22: qty 1

## 2017-08-22 MED ORDER — ONDANSETRON HCL 4 MG/2ML IJ SOLN
4.0000 mg | Freq: Once | INTRAMUSCULAR | Status: AC
  Administered 2017-08-22: 4 mg via INTRAVENOUS
  Filled 2017-08-22: qty 2

## 2017-08-22 NOTE — H&P (Signed)
History and Physical    Pam Fuller DGL:[id number] DOB: [birth year] DOA: 09/16/2017  PCP: Patient, No Pcp Per  Patient coming from: Home.  Chief Complaint: Abdominal pain.  HPI: Pam Fuller is a 84 y.o. female with with history of ESRD on hemodialysis Monday Wednesday and Friday, hypertension, anemia and history of breast cancer presents to the ER because of abdominal pain.  Patient has been having right lower quadrant abdominal pain for last 2 days.  Has been having nausea vomiting.  Denies any diarrhea fever or chills.  Denies any chest pain or shortness of breath.  Had dialysis yesterday.  ED Course: In the ER CT abdomen pelvis shows features consistent with acute cholecystitis.  On-call general surgeon was consulted.  Patient was started on cholecystitis.  Patient has still not decided if patient was to have surgery for drain.  Okay with antibiotics.  At the time of my exam patient has not already received pain medication and states her abdominal pain is improved.  Review of Systems: As per HPI, rest all negative.   Past Medical History:  Diagnosis Date  . Arthritis   . Blood transfusion without reported diagnosis   . Cancer [facility])    patient states she had br ca 13 years ago  . ESRD (end stage renal disease) on dialysis ([place])   . Hypertension   . Renal disorder    patient on dialysis  . Wears glasses     Past Surgical History:  Procedure Laterality Date  . ABDOMINAL HYSTERECTOMY    . APPENDECTOMY    . BREAST LUMPECTOMY WITH AXILLARY LYMPH NODE BIOPSY  2001   left  . BREAST SURGERY    . COLONOSCOPY    . PARTIAL MASTECTOMY WITH NEEDLE LOCALIZATION Left 06/18/2013   Procedure: PARTIAL MASTECTOMY WITH NEEDLE LOCALIZATION;  Surgeon: Adin Hector, MD;  Location: [facility];  Service: General;  Laterality: Left;  . TONSILLECTOMY       reports that she has never smoked. She has never used smokeless tobacco. She reports that she does not drink  alcohol or use drugs.  Allergies  Allergen Reactions  . Erythromycin Nausea And Vomiting  . Vicodin [Hydrocodone-Acetaminophen]     unknown    Family History  Problem Relation Age of Onset  . Cancer Maternal Aunt        breast    Prior to Admission medications   Medication Sig Start Date End Date Taking? Authorizing Provider  amLODipine (NORVASC) 10 MG tablet Take 10 mg by mouth every evening.  03/31/13  Yes [provider]  calcium acetate (PHOSLO) 667 MG capsule Take 2 capsules (1,334 mg total) by mouth 3 (three) times daily with meals. 09/15/15  Yes Dana Allan I, MD  cloNIDine (CATAPRES) 0.1 MG tablet Take 0.1 mg by mouth every evening.  03/24/13  Yes [provider]  fluticasone (FLONASE) 50 MCG/ACT nasal spray Place 2 sprays into both nostrils daily. 09/15/15  Yes Dana Allan I, MD  hydrALAZINE (APRESOLINE) 50 MG tablet Take 50 mg by mouth every evening.  04/14/13  Yes [provider]  multivitamin (RENA-VIT) TABS tablet Take 1 tablet by mouth at bedtime. 09/15/15  Yes Bonnell Public, MD  tamoxifen (NOLVADEX) 20 MG tablet Take 1 tablet (20 mg total) by mouth daily. Patient taking differently: Take 20 mg by mouth every evening.  02/12/17  Yes Causey, Charlestine Massed, NP  hydrOXYzine (ATARAX/VISTARIL) 25 MG tablet Take 1 tablet (25 mg total) by mouth  every 8 (eight) hours as needed for itching. Patient not taking: Reported on 08/22/2017 09/15/15   Bonnell Public, MD    Physical Exam: Vitals:   08/22/17 0100 08/22/17 0118 08/22/17 0119 08/22/17 0130  BP: (!) 200/88 (!) 183/83 (!) 183/83 (!) 176/81  Pulse: 88   85  Resp: 15   19  Temp:      TempSrc:      SpO2: 100%   98%      Constitutional: Moderately built and nourished. Vitals:   08/22/17 0100 08/22/17 0118 08/22/17 0119 08/22/17 0130  BP: (!) 200/88 (!) 183/83 (!) 183/83 (!) 176/81  Pulse: 88   85  Resp: 15   19  Temp:      TempSrc:      SpO2: 100%   98%   Eyes:  Anicteric no pallor. ENMT: No discharge from the ears eyes nose or mouth. Neck: No JVD appreciated no mass felt. Respiratory: No rhonchi or crepitations. Cardiovascular: S1-S2 heard no murmurs appreciated. Abdomen: Soft nontender bowel sounds present. Musculoskeletal: No edema.  No joint effusion. Skin: No rash.  Skin appears warm. Neurologic: Alert awake oriented to time place and person.  Moves all extremities. Psychiatric: Appears normal.  Normal affect.   Labs on Admission: I have personally reviewed following labs and imaging studies  CBC: Recent Labs  Lab 09/12/2017 1618  WBC 11.8*  NEUTROABS 10.9*  HGB 10.7*  HCT 33.5*  MCV 93.1  PLT 010   Basic Metabolic Panel: Recent Labs  Lab 08/29/2017 1618  NA 136  K 2.8*  CL 100*  CO2 27  GLUCOSE 155*  BUN 9  CREATININE 4.43*  CALCIUM 7.8*   GFR: CrCl cannot be calculated (Unknown ideal weight.). Liver Function Tests: Recent Labs  Lab 09/02/2017 1618  AST 23  ALT 12*  ALKPHOS 66  BILITOT 1.0  PROT 7.3  ALBUMIN 3.6   Recent Labs  Lab 09/14/2017 1618  LIPASE 36   No results for input(s): AMMONIA in the last 168 hours. Coagulation Profile: No results for input(s): INR, PROTIME in the last 168 hours. Cardiac Enzymes: No results for input(s): CKTOTAL, CKMB, CKMBINDEX, TROPONINI in the last 168 hours. BNP (last 3 results) No results for input(s): PROBNP in the last 8760 hours. HbA1C: No results for input(s): HGBA1C in the last 72 hours. CBG: No results for input(s): GLUCAP in the last 168 hours. Lipid Profile: No results for input(s): CHOL, HDL, LDLCALC, TRIG, CHOLHDL, LDLDIRECT in the last 72 hours. Thyroid Function Tests: No results for input(s): TSH, T4TOTAL, FREET4, T3FREE, THYROIDAB in the last 72 hours. Anemia Panel: No results for input(s): VITAMINB12, FOLATE, FERRITIN, TIBC, IRON, RETICCTPCT in the last 72 hours. Urine analysis:    Component Value Date/Time   COLORURINE STRAW (A) 08/24/2017 2237    APPEARANCEUR CLEAR 09/03/2017 2237   LABSPEC 1.006 09/10/2017 2237   PHURINE 9.0 (H) 09/12/2017 2237   GLUCOSEU 150 (A) 08/24/2017 2237   HGBUR SMALL (A) 09/10/2017 2237   BILIRUBINUR NEGATIVE 09/12/2017 2237   KETONESUR NEGATIVE 08/24/2017 2237   PROTEINUR 30 (A) 09/11/2017 2237   UROBILINOGEN 0.2 03/31/2010 1319   NITRITE NEGATIVE 08/31/2017 2237   LEUKOCYTESUR NEGATIVE 08/28/2017 2237   Sepsis Labs: @LABRCNTIP (procalcitonin:4,lacticidven:4) )No results found for this or any previous visit (from the past 240 hour(s)).   Radiological Exams on Admission: Ct Abdomen Pelvis Wo Contrast  Result Date: 08/22/2017 CLINICAL DATA:  Right lower quadrant pain. EXAM: CT ABDOMEN AND PELVIS WITHOUT CONTRAST TECHNIQUE: Multidetector CT imaging  of the abdomen and pelvis was performed following the standard protocol without IV contrast. COMPARISON:  Lumbar spine x-rays dated September 13, 2015. CT abdomen pelvis dated March 31, 2010. FINDINGS: Lower chest: No acute abnormality. Hepatobiliary: No focal liver abnormality. The gallbladder is distended and contains multiple small gallstones, one of which is seen in the gallbladder neck. Mild gallbladder wall thickening with surrounding pericholecystic fluid. Mild dilatation of the common bile duct, measuring up to 10 mm. Pancreas: Mild atrophy. No ductal dilatation or surrounding inflammatory changes. Spleen: Normal in size without focal abnormality. Adrenals/Urinary Tract: Adrenal glands are unremarkable. Severe atrophy of the bilateral kidneys. No renal or ureteral calculi. No hydronephrosis. The bladder is unremarkable. Stomach/Bowel: Small hiatal hernia. The stomach is distended with oral contrast. No evidence of bowel wall thickening, distention, or surrounding inflammatory changes. Extensive colonic diverticulosis. Vascular/Lymphatic: Aortic atherosclerosis. No enlarged abdominal or pelvic lymph nodes. Reproductive: Status post hysterectomy. No adnexal masses.  Other: No abdominal wall hernia or abnormality. Trace free fluid in the pelvis. No pneumoperitoneum. Musculoskeletal: Mild superior endplate compression deformities of L1 and L2 appear chronic but are new when compared to prior study. IMPRESSION: 1. Prominently distended gallbladder with a gallstone lodged in the gallbladder neck. Mild gallbladder wall thickening and surrounding pericholecystic fluid. Findings are concerning for acute cholecystitis. 2. Mild dilatation of the common bile duct, measuring up to 10 mm. No obstructing mass or stones seen. 3.  Aortic atherosclerosis (ICD10-I70.0). Electronically Signed   By: Titus Dubin M.D.   On: 08/22/2017 00:03    EKG: Independently reviewed.  Normal sinus rhythm with atrial premature complexes RBBB and LAFB.  Assessment/Plan Principal Problem:   Acute cholecystitis Active Problems:   ESRD on dialysis [facility])   Essential hypertension    1. Acute cholecystitis -appreciate surgical consult patient on Zosyn.  Patient yet to decide on surgery upper abdomen.  Will keep patient n.p.o. 2. Hypertension uncontrolled/hypertensive urgency -since patient is n.p.o. I have placed patient on clonidine patch and PRN IV hydralazine.  Closely follow blood pressure trends. 3. ESRD on hemodialysis on Monday Wednesday and Friday.  Patient has had dialysis yesterday.  Consult nephrology for dialysis. 4. Anemia secondary to ESRD.  Follow CBC. 5. History of breast cancer.   DVT prophylaxis: SCDs. Code Status: Full code. Family Communication: Patient's family at the bedside. Disposition Plan: Home. Consults called: General surgery. Admission status: Inpatient.   Rise Patience MD [facility] Pager [phone].  If 7PM-7AM, please contact night-coverage [email or web address] Password [email or web address]  08/22/2017, 2:22 AM

## 2017-08-22 NOTE — Progress Notes (Signed)
[facility] Progress Note     Subjective: CC- abdominal pain Patient admitted last night with acute cholecystitis. She continues to decline surgery or perc drainage of gallbladder. She states that her pain is improving this morning. Denies n/v. States that she is thirsty.  Objective: Vital signs in last 24 hours: Temp:  [98.1 F (36.7 C)-99.3 F (37.4 C)] 99.3 F (37.4 C) (04/04 0512) Pulse Rate:  [71-101] 74 (04/04 0512) Resp:  [13-27] 19 (04/04 0512) BP: (104-204)/(58-100) 104/58 (04/04 0512) SpO2:  [98 %-100 %] 98 % (04/04 0512) Last BM Date: 08/23/2017  Intake/Output from previous day: No intake/output data recorded. Intake/Output this shift: No intake/output data recorded.  PE: Gen:  Alert, NAD, pleasant HEENT: EOM's intact, pupils equal and round Card:  RRR, no M/G/R heard Pulm:  CTAB, no W/R/R, effort normal Abd: Soft, ND, +BS, no HSM, no hernia, mild RUQ/epigastric region TTP Ext: calves soft and nontender Skin: no rashes noted, warm and dry  Lab Results:  Recent Labs    09/12/2017 1618 08/22/17 0636  WBC 11.8* 14.9*  HGB 10.7* 10.5*  HCT 33.5* 32.8*  PLT 216 199   BMET Recent Labs    08/19/2017 1618 08/22/17 0636  NA 136 139  K 2.8* 3.7  CL 100* 93*  CO2 27 31  GLUCOSE 155* 97  BUN 9 18  CREATININE 4.43* 5.55*  CALCIUM 7.8* 8.8*   PT/INR No results for input(s): LABPROT, INR in the last 72 hours. CMP     Component Value Date/Time   NA 139 08/22/2017 0636   NA 142 02/12/2017 1400   K 3.7 08/22/2017 0636   K 3.9 02/12/2017 1400   CL 93 (L) 08/22/2017 0636   CO2 31 08/22/2017 0636   CO2 30 (H) 02/12/2017 1400   GLUCOSE 97 08/22/2017 0636   GLUCOSE 88 02/12/2017 1400   BUN 18 08/22/2017 0636   BUN 16.9 02/12/2017 1400   CREATININE 5.55 (H) 08/22/2017 0636   CREATININE 6.2 (HH) 02/12/2017 1400   CALCIUM 8.8 (L) 08/22/2017 0636   CALCIUM 9.2 02/12/2017 1400   PROT 7.3 08/22/2017 1618   PROT 6.8 02/12/2017 1400   ALBUMIN 3.6  09/06/2017 1618   ALBUMIN 3.3 (L) 02/12/2017 1400   AST 23 09/02/2017 1618   AST 13 02/12/2017 1400   ALT 12 (L) 09/01/2017 1618   ALT 7 02/12/2017 1400   ALKPHOS 66 09/07/2017 1618   ALKPHOS 75 02/12/2017 1400   BILITOT 1.0 08/23/2017 1618   BILITOT 0.68 02/12/2017 1400   GFRNONAA 6 (L) 08/22/2017 0636   GFRAA 7 (L) 08/22/2017 0636   Lipase     Component Value Date/Time   LIPASE 36 09/06/2017 1618       Studies/Results: Ct Abdomen Pelvis Wo Contrast  Result Date: 08/22/2017 CLINICAL DATA:  Right lower quadrant pain. EXAM: CT ABDOMEN AND PELVIS WITHOUT CONTRAST TECHNIQUE: Multidetector CT imaging of the abdomen and pelvis was performed following the standard protocol without IV contrast. COMPARISON:  Lumbar spine x-rays dated September 13, 2015. CT abdomen pelvis dated March 31, 2010. FINDINGS: Lower chest: No acute abnormality. Hepatobiliary: No focal liver abnormality. The gallbladder is distended and contains multiple small gallstones, one of which is seen in the gallbladder neck. Mild gallbladder wall thickening with surrounding pericholecystic fluid. Mild dilatation of the common bile duct, measuring up to 10 mm. Pancreas: Mild atrophy. No ductal dilatation or surrounding inflammatory changes. Spleen: Normal in size without focal abnormality. Adrenals/Urinary Tract: Adrenal glands are unremarkable. Severe atrophy  of the bilateral kidneys. No renal or ureteral calculi. No hydronephrosis. The bladder is unremarkable. Stomach/Bowel: Small hiatal hernia. The stomach is distended with oral contrast. No evidence of bowel wall thickening, distention, or surrounding inflammatory changes. Extensive colonic diverticulosis. Vascular/Lymphatic: Aortic atherosclerosis. No enlarged abdominal or pelvic lymph nodes. Reproductive: Status post hysterectomy. No adnexal masses. Other: No abdominal wall hernia or abnormality. Trace free fluid in the pelvis. No pneumoperitoneum. Musculoskeletal: Mild superior  endplate compression deformities of L1 and L2 appear chronic but are new when compared to prior study. IMPRESSION: 1. Prominently distended gallbladder with a gallstone lodged in the gallbladder neck. Mild gallbladder wall thickening and surrounding pericholecystic fluid. Findings are concerning for acute cholecystitis. 2. Mild dilatation of the common bile duct, measuring up to 10 mm. No obstructing mass or stones seen. 3.  Aortic atherosclerosis (ICD10-I70.0). Electronically Signed   By: Titus Dubin M.D.   On: 08/22/2017 00:03    Anti-infectives: Anti-infectives (From admission, onward)   Start     Dose/Rate Route Frequency Ordered Stop   08/22/17 1000  piperacillin-tazobactam (ZOSYN) IVPB 3.375 g    Note to Pharmacy:  Zosyn 3.375 g IV q12h in ESRD on HD   3.375 g 12.5 mL/hr over 240 Minutes Intravenous Every 12 hours 08/22/17 0128     08/22/17 0045  piperacillin-tazobactam (ZOSYN) IVPB 3.375 g     3.375 g 12.5 mL/hr over 240 Minutes Intravenous  Once 08/22/17 0038 08/22/17 0145       Assessment/Plan HTN ESRD - HD MWF H/o breast cancer  Acute cholecystitis - CT shows prominently distended gallbladder with a gallstone lodged in the  gallbladder neck, mild gallbladder wall thickening and surrounding pericholecystic fluid; mild dilatation of the common bile duct measuring up to 10 mm - on admission WBC 11.8, LFTs WNL, lipase 36  ID - zosyn 4/4>> FEN - IVF, NPO VTE - SCDs Foley - none Follow up - TBD  Plan - Patient still declines surgery or percutaneous drainage of her gallbladder. She states her abdominal pain is improving this morning and just wants to continue IV antibiotics. Will continue with current plan and follow. Labs in AM.   LOS: 0 days    Wellington Hampshire , [facility] 08/22/2017, 8:39 AM Pager: [phone] Consults: [phone] Mon-Fri 7:00 am-4:30 pm Sat-Sun 7:00 am-11:30 am

## 2017-08-22 NOTE — ED Notes (Signed)
Dr Dema Severin (surgery) in to see patient.

## 2017-08-22 NOTE — Progress Notes (Signed)
Pam Fuller is a 84 y.o. female patient admitted from ED awake, alert - oriented  X 4 - no acute distress noted.  VS - Blood pressure (!) 184/100, pulse (!) 101, temperature 99.2 F (37.3 C), temperature source Oral, resp. rate 18, SpO2 99 %.    IV in place, occlusive dsg intact without redness.    Will cont to eval and treat per MD orders.  Vidal Schwalbe, RN 08/22/2017 4:01 AM

## 2017-08-22 NOTE — Progress Notes (Signed)
Patient admitted this morning with acute cholecystitis. She has been evaluated by general surgery. She has end-stage renal disease on hemodialysis and being seen by nephrology as well. She has been trying to decide between percutaneous cholecystostomy tube and laparoscopic cholecystectomy. According to nephrology patient is cleared for surgery. Patient is now leaning towards surgical intervention. Continue pain management while undergoing hemodialysis in the hospital

## 2017-08-22 NOTE — H&P (Signed)
CC: Mid abdominal pain and Right sided abdominal pain - consult by Dr. Regenia Skeeter  HPI: Pam Fuller is an 84 y.o. female with hx of HTN, ESRD (controlled on HD M/W/F) presents to ED with 1d hx of mid abdominal and right sided abdominal pain. Pain is crampy and constant. She has never had this before. Pain is more in RLQ than RUQ. Pain does not radiate. Nothing makes it better or worse. She has associated nausea and vomiting throughout the day today. Was at dialysis but left 2hrs in due to nausea/vomiting. She denies fever/chills. She denies any other complaints including constipation/diarrhea.  Past Medical History:  Diagnosis Date  . Arthritis   . Blood transfusion without reported diagnosis   . Cancer [facility])    patient states she had br ca 13 years ago  . ESRD (end stage renal disease) on dialysis ([place])   . Hypertension   . Renal disorder    patient on dialysis  . Wears glasses    PSH: TAH via Pfannenstiel Appendectomy - unclear if lap or open but scars suggest low midline incision Breast surgery - left lumpectomy x2 for cancer (most recent with Dr. Dalbert Batman)- on Tamoxifen currently for 33yrrisk reduction  Past Surgical History:  Procedure Laterality Date  . ABDOMINAL HYSTERECTOMY    . APPENDECTOMY    . BREAST LUMPECTOMY WITH AXILLARY LYMPH NODE BIOPSY  2001   left  . BREAST SURGERY    . COLONOSCOPY    . PARTIAL MASTECTOMY WITH NEEDLE LOCALIZATION Left 06/18/2013   Procedure: PARTIAL MASTECTOMY WITH NEEDLE LOCALIZATION;  Surgeon: HAdin Hector MD;  [facility]  Service: General;  Laterality: Left;  . TONSILLECTOMY      Family History  Problem Relation Age of Onset  . Cancer Maternal Aunt        breast    Social:  reports that she has never smoked. She has never used smokeless tobacco. She reports that she does not drink alcohol or use drugs.  Allergies:  Allergies  Allergen Reactions  . Erythromycin Nausea And Vomiting  . Vicodin  [Hydrocodone-Acetaminophen]     unknown    Medications: I have reviewed the patient's current medications.  Results for orders placed or performed during the hospital encounter of 09/07/2017 (from the past 48 hour(s))  CBC with Differential     Status: Abnormal   Collection Time: 09/06/2017  4:18 PM  Result Value Ref Range   WBC 11.8 (H) 4.0 - 10.5 K/uL   RBC 3.60 (L) 3.87 - 5.11 MIL/uL   Hemoglobin 10.7 (L) 12.0 - 15.0 g/dL   HCT 33.5 (L) 36.0 - 46.0 %   MCV 93.1 78.0 - 100.0 fL   MCH 29.7 26.0 - 34.0 pg   MCHC 31.9 30.0 - 36.0 g/dL   RDW 15.0 11.5 - 15.5 %   Platelets 216 150 - 400 K/uL   Neutrophils Relative % 93 %   Neutro Abs 10.9 (H) 1.7 - 7.7 K/uL   Lymphocytes Relative 5 %   Lymphs Abs 0.6 (L) 0.7 - 4.0 K/uL   Monocytes Relative 2 %   Monocytes Absolute 0.2 0.1 - 1.0 K/uL   Eosinophils Relative 0 %   Eosinophils Absolute 0.0 0.0 - 0.7 K/uL   Basophils Relative 0 %   Basophils Absolute 0.0 0.0 - 0.1 K/uL    Comment: Performed at [facility] [address] [place] [id number] Comprehensive metabolic panel     Status:  Abnormal   Collection Time: 09/17/2017  4:18 PM  Result Value Ref Range   Sodium 136 135 - 145 mmol/L   Potassium 2.8 (L) 3.5 - 5.1 mmol/L   Chloride 100 (L) 101 - 111 mmol/L   CO2 27 22 - 32 mmol/L   Glucose, Bld 155 (H) 65 - 99 mg/dL   BUN 9 6 - 20 mg/dL   Creatinine, Ser 4.43 (H) 0.44 - 1.00 mg/dL   Calcium 7.8 (L) 8.9 - 10.3 mg/dL   Total Protein 7.3 6.5 - 8.1 g/dL   Albumin 3.6 3.5 - 5.0 g/dL   AST 23 15 - 41 U/L   ALT 12 (L) 14 - 54 U/L   Alkaline Phosphatase 66 38 - 126 U/L   Total Bilirubin 1.0 0.3 - 1.2 mg/dL   GFR calc non Af Amer 8 (L) >60 mL/min   GFR calc Af Amer 10 (L) >60 mL/min    Comment: (NOTE) The eGFR has been calculated using the CKD EPI equation. This calculation has not been validated in all clinical situations. eGFR's persistently <60 mL/min signify possible Chronic Kidney Disease.    Anion gap 9 5 - 15     Comment: Performed at [facility] [address], [place], [place] [address]  Lipase, blood     Status: None   Collection Time: 09/09/2017  4:18 PM  Result Value Ref Range   Lipase 36 11 - 51 U/L    Comment: Performed at [facility] [address] [place], [place] [address]  Urinalysis, Routine w reflex microscopic     Status: Abnormal   Collection Time: 09/11/2017 10:37 PM  Result Value Ref Range   Color, Urine STRAW (A) YELLOW   APPearance CLEAR CLEAR   Specific Gravity, Urine 1.006 1.005 - 1.030   pH 9.0 (H) 5.0 - 8.0   Glucose, UA 150 (A) NEGATIVE mg/dL   Hgb urine dipstick SMALL (A) NEGATIVE   Bilirubin Urine NEGATIVE NEGATIVE   Ketones, ur NEGATIVE NEGATIVE mg/dL   Protein, ur 30 (A) NEGATIVE mg/dL   Nitrite NEGATIVE NEGATIVE   Leukocytes, UA NEGATIVE NEGATIVE   RBC / HPF 0-5 0 - 5 RBC/hpf   WBC, UA 0-5 0 - 5 WBC/hpf   Bacteria, UA NONE SEEN NONE SEEN   Squamous Epithelial / LPF 0-5 (A) NONE SEEN    Comment: Performed at [facility], [place] [address] [place], [place] [address]    Ct Abdomen Pelvis Wo Contrast  Result Date: 08/22/2017 CLINICAL DATA:  Right lower quadrant pain. EXAM: CT ABDOMEN AND PELVIS WITHOUT CONTRAST TECHNIQUE: Multidetector CT imaging of the abdomen and pelvis was performed following the standard protocol without IV contrast. COMPARISON:  Lumbar spine x-rays dated September 13, 2015. CT abdomen pelvis dated March 31, 2010. FINDINGS: Lower chest: No acute abnormality. Hepatobiliary: No focal liver abnormality. The gallbladder is distended and contains multiple small gallstones, one of which is seen in the gallbladder neck. Mild gallbladder wall thickening with surrounding pericholecystic fluid. Mild dilatation of the common bile duct, measuring up to 10 mm. Pancreas: Mild atrophy. No ductal dilatation or surrounding inflammatory changes. Spleen: Normal in size without focal abnormality. Adrenals/Urinary Tract: Adrenal glands are unremarkable.  Severe atrophy of the bilateral kidneys. No renal or ureteral calculi. No hydronephrosis. The bladder is unremarkable. Stomach/Bowel: Small hiatal hernia. The stomach is distended with oral contrast. No evidence of bowel wall thickening, distention, or surrounding inflammatory changes. Extensive colonic diverticulosis. Vascular/Lymphatic: Aortic atherosclerosis. No  enlarged abdominal or pelvic lymph nodes. Reproductive: Status post hysterectomy. No adnexal masses. Other: No abdominal wall hernia or abnormality. Trace free fluid in the pelvis. No pneumoperitoneum. Musculoskeletal: Mild superior endplate compression deformities of L1 and L2 appear chronic but are new when compared to prior study. IMPRESSION: 1. Prominently distended gallbladder with a gallstone lodged in the gallbladder neck. Mild gallbladder wall thickening and surrounding pericholecystic fluid. Findings are concerning for acute cholecystitis. 2. Mild dilatation of the common bile duct, measuring up to 10 mm. No obstructing mass or stones seen. 3.  Aortic atherosclerosis (ICD10-I70.0). Electronically Signed   By: Titus Dubin M.D.   On: 08/22/2017 00:03    ROS - all of the below systems have been reviewed with the patient and positives are indicated with bold text General: chills, fever or night sweats Eyes: blurry vision or double vision ENT: epistaxis or sore throat Allergy/Immunology: itchy/watery eyes or nasal congestion Hematologic/Lymphatic: bleeding problems, blood clots or swollen lymph nodes Endocrine: temperature intolerance or unexpected weight changes Breast: new or changing breast lumps or nipple discharge Resp: cough, shortness of breath, or wheezing CV: chest pain or dyspnea on exertion GI: as per HPI GU: dysuria, trouble voiding, or hematuria MSK: joint pain or joint stiffness Neuro: TIA or stroke symptoms Derm: pruritus and skin lesion changes Psych: anxiety and depression  PE Blood pressure (!) 175/70, pulse  71, temperature 99.2 F (37.3 C), temperature source Oral, resp. rate 19, SpO2 99 %. Constitutional: NAD; conversant; no deformities Eyes: Moist conjunctiva; no lid lag; anicteric; PERRL Neck: Trachea midline; no thyromegaly Lungs: Normal respiratory effort; no tactile fremitus CV: RRR; no palpable thrills; no pitting edema GI: Abd soft, mildly ttp in RUQ & RLQ; no palpable hepatosplenomegaly. Negative Murphy's. No rebound/guarding. MSK: LUE with some swelling; noted AVF in L arm; no clubbing/cyanosis. Psychiatric: Appropriate affect; alert and oriented x3 Lymphatic: No palpable cervical or axillary lymphadenopathy  Results for orders placed or performed during the hospital encounter of 09/06/2017 (from the past 48 hour(s))  CBC with Differential     Status: Abnormal   Collection Time: 09/03/2017  4:18 PM  Result Value Ref Range   WBC 11.8 (H) 4.0 - 10.5 K/uL   RBC 3.60 (L) 3.87 - 5.11 MIL/uL   Hemoglobin 10.7 (L) 12.0 - 15.0 g/dL   HCT 33.5 (L) 36.0 - 46.0 %   MCV 93.1 78.0 - 100.0 fL   MCH 29.7 26.0 - 34.0 pg   MCHC 31.9 30.0 - 36.0 g/dL   RDW 15.0 11.5 - 15.5 %   Platelets 216 150 - 400 K/uL   Neutrophils Relative % 93 %   Neutro Abs 10.9 (H) 1.7 - 7.7 K/uL   Lymphocytes Relative 5 %   Lymphs Abs 0.6 (L) 0.7 - 4.0 K/uL   Monocytes Relative 2 %   Monocytes Absolute 0.2 0.1 - 1.0 K/uL   Eosinophils Relative 0 %   Eosinophils Absolute 0.0 0.0 - 0.7 K/uL   Basophils Relative 0 %   Basophils Absolute 0.0 0.0 - 0.1 K/uL    Comment: Performed at [facility], [address], [place], [place] [address]  Comprehensive metabolic panel     Status: Abnormal   Collection Time: 09/15/2017  4:18 PM  Result Value Ref Range   Sodium 136 135 - 145 mmol/L   Potassium 2.8 (L) 3.5 - 5.1 mmol/L   Chloride 100 (L) 101 - 111 mmol/L   CO2 27 22 - 32 mmol/L   Glucose, Bld  155 (H) 65 - 99 mg/dL   BUN 9 6 - 20 mg/dL   Creatinine, Ser 4.43 (H) 0.44 - 1.00 mg/dL   Calcium 7.8 (L) 8.9 - 10.3 mg/dL     Total Protein 7.3 6.5 - 8.1 g/dL   Albumin 3.6 3.5 - 5.0 g/dL   AST 23 15 - 41 U/L   ALT 12 (L) 14 - 54 U/L   Alkaline Phosphatase 66 38 - 126 U/L   Total Bilirubin 1.0 0.3 - 1.2 mg/dL   GFR calc non Af Amer 8 (L) >60 mL/min   GFR calc Af Amer 10 (L) >60 mL/min    Comment: (NOTE) The eGFR has been calculated using the CKD EPI equation. This calculation has not been validated in all clinical situations. eGFR's persistently <60 mL/min signify possible Chronic Kidney Disease.    Anion gap 9 5 - 15    Comment: Performed at [facility] [address], [place], [place] [address]  Lipase, blood     Status: None   Collection Time: 09/14/2017  4:18 PM  Result Value Ref Range   Lipase 36 11 - 51 U/L    Comment: Performed at [facility] [address], [place], [place] [address]  Urinalysis, Routine w reflex microscopic     Status: Abnormal   Collection Time: 09/06/2017 10:37 PM  Result Value Ref Range   Color, Urine STRAW (A) YELLOW   APPearance CLEAR CLEAR   Specific Gravity, Urine 1.006 1.005 - 1.030   pH 9.0 (H) 5.0 - 8.0   Glucose, UA 150 (A) NEGATIVE mg/dL   Hgb urine dipstick SMALL (A) NEGATIVE   Bilirubin Urine NEGATIVE NEGATIVE   Ketones, ur NEGATIVE NEGATIVE mg/dL   Protein, ur 30 (A) NEGATIVE mg/dL   Nitrite NEGATIVE NEGATIVE   Leukocytes, UA NEGATIVE NEGATIVE   RBC / HPF 0-5 0 - 5 RBC/hpf   WBC, UA 0-5 0 - 5 WBC/hpf   Bacteria, UA NONE SEEN NONE SEEN   Squamous Epithelial / LPF 0-5 (A) NONE SEEN    Comment: Performed at [facility], [place] [address], [place], [place] [address]    Ct Abdomen Pelvis Wo Contrast  Result Date: 08/22/2017 CLINICAL DATA:  Right lower quadrant pain. EXAM: CT ABDOMEN AND PELVIS WITHOUT CONTRAST TECHNIQUE: Multidetector CT imaging of the abdomen and pelvis was performed following the standard protocol without IV contrast. COMPARISON:  Lumbar spine x-rays dated September 13, 2015. CT abdomen pelvis dated March 31, 2010. FINDINGS:  Lower chest: No acute abnormality. Hepatobiliary: No focal liver abnormality. The gallbladder is distended and contains multiple small gallstones, one of which is seen in the gallbladder neck. Mild gallbladder wall thickening with surrounding pericholecystic fluid. Mild dilatation of the common bile duct, measuring up to 10 mm. Pancreas: Mild atrophy. No ductal dilatation or surrounding inflammatory changes. Spleen: Normal in size without focal abnormality. Adrenals/Urinary Tract: Adrenal glands are unremarkable. Severe atrophy of the bilateral kidneys. No renal or ureteral calculi. No hydronephrosis. The bladder is unremarkable. Stomach/Bowel: Small hiatal hernia. The stomach is distended with oral contrast. No evidence of bowel wall thickening, distention, or surrounding inflammatory changes. Extensive colonic diverticulosis. Vascular/Lymphatic: Aortic atherosclerosis. No enlarged abdominal or pelvic lymph nodes. Reproductive: Status post hysterectomy. No adnexal masses. Other: No abdominal wall hernia or abnormality. Trace free fluid in the pelvis. No pneumoperitoneum. Musculoskeletal: Mild superior endplate compression deformities of L1 and L2 appear chronic but are new when compared to prior study. IMPRESSION: 1. Prominently  distended gallbladder with a gallstone lodged in the gallbladder neck. Mild gallbladder wall thickening and surrounding pericholecystic fluid. Findings are concerning for acute cholecystitis. 2. Mild dilatation of the common bile duct, measuring up to 10 mm. No obstructing mass or stones seen. 3.  Aortic atherosclerosis (ICD10-I70.0). Electronically Signed   By: Titus Dubin M.D.   On: 08/22/2017 00:03    A/P: UZMA HELLMER is an 84 y.o. female with hx of HTN, ESRD on HD M/W/F with acute cholecystitis  -She is being admitted to medicine -NPO, IVF as necessary per medicine, IV abx - Zosyn -I discussed the anatomy and physiology of the biliary system with the patient and her  oldest sone. I discussed the pathophysiology of gallbladder disease with them with associated pictures. I discussed the treatment approach and recommendation for surgery to address her problem. I discussed laparoscopic as well as open techniques, material risks (including but not limited to pain, bleeding, infection, damage to surrounding structures, damage to bile duct, bile leak, hernia, heart attack, stroke, death), benefits and alternatives. -She asked if there were any other options short of surgery and we discussed IV abx and even IR for perc drain placement. Her questions were answered and she voiced understanding. She is not interested in perc drain and she is also not interested in surgery at this time. -For time being, would continue IV abx and we will be back to discuss further during daylight hours after she's had more time to think about everything  Pam Fuller, M.D. [facility]

## 2017-08-22 NOTE — Consult Note (Addendum)
Pam Fuller Renal Consultation Note    Indication for Consultation:  Management of ESRD/hemodialysis, anemia, hypertension/volume, and secondary hyperparathyroidism. PCP:  HPI: Pam Fuller is a 84 y.o. female with ESRD, HTN, Hx breast cancer (years ago) who was admitted with acute cholecystitis.  Developed R sided abdominal pain on 4/3 when waking. Pain progressed throughout the day. Went for usual HD session, but stopped after 2 hours and sent to ED d/t abd pain. In ED, labs showed Na 136, K 2.8 (post-HD), Glu 155, Lipase 36, WBC 11.8, Hgb 10.7. Abdominal CT concerning for acute cholecystitis. She was admitted, started on abx (Zosyn) and surgery was consulted. Apparently, she refused surgery. Tells me this morning, that she was reconsidering.  Dialyzes MWF at [facility]. S/p partial HD yesterday d/t abdominal pain. Labs ok this morning. No CP, dyspnea. Abd pain is improving slightly, no vomiting. Feels thirsty.  Past Medical History:  Diagnosis Date  . Arthritis   . Blood transfusion without reported diagnosis   . Cancer [facility])    patient states she had br ca 13 years ago  . ESRD (end stage renal disease) on dialysis ([place])   . Hypertension   . Renal disorder    patient on dialysis  . Wears glasses    Past Surgical History:  Procedure Laterality Date  . ABDOMINAL HYSTERECTOMY    . APPENDECTOMY    . BREAST LUMPECTOMY WITH AXILLARY LYMPH NODE BIOPSY  2001   left  . BREAST SURGERY    . COLONOSCOPY    . PARTIAL MASTECTOMY WITH NEEDLE LOCALIZATION Left 06/18/2013   Procedure: PARTIAL MASTECTOMY WITH NEEDLE LOCALIZATION;  Surgeon: Adin Hector, MD;  Location: [place];  Service: General;  Laterality: Left;  . TONSILLECTOMY     Family History  Problem Relation Age of Onset  . Cancer Maternal Aunt        breast   Social History:  reports that she has never smoked. She has never used smokeless tobacco. She reports that she does not drink alcohol or  use drugs.  ROS: As per HPI otherwise negative.  Physical Exam: Vitals:   08/22/17 0200 08/22/17 0230 08/22/17 0322 08/22/17 0512  BP: (!) 190/84 (!) 182/79 (!) 184/100 (!) 104/58  Pulse: 81 80 (!) 101 74  Resp: 20 17 18 19   Temp:   99.2 F (37.3 C) 99.3 F (37.4 C)  TempSrc:   Oral Oral  SpO2: 99% 99% 99% 98%     General: Well developed, elderly woman, in no acute distress. Head: Normocephalic, atraumatic, sclera non-icteric. Neck: Supple without lymphadenopathy/masses. JVD not elevated. Lungs: Clear bilaterally to auscultation without wheezes, rales, or rhonchi.  Heart: RRR, 2/6 systolic murmur Abdomen: Soft, mild tenderness in mid R abdomen without guarding. Musculoskeletal:  Strength and tone appear normal for age. Lower extremities: No edema or ischemic changes, no open wounds. Neuro: Alert and oriented X 3. Moves all extremities spontaneously. Psych:  Responds to questions appropriately with a normal affect. Dialysis Access: L AVG + bruit (bandage still in place)  Allergies  Allergen Reactions  . Erythromycin Nausea And Vomiting  . Vicodin [Hydrocodone-Acetaminophen]     unknown   Prior to Admission medications   Medication Sig Start Date End Date Taking? Authorizing Provider  amLODipine (NORVASC) 10 MG tablet Take 10 mg by mouth every evening.  03/31/13  Yes [provider]  calcium acetate (PHOSLO) 667 MG capsule Take 2 capsules (1,334 mg total) by mouth 3 (three) times daily with meals.  09/15/15  Yes Dana Allan I, MD  cloNIDine (CATAPRES) 0.1 MG tablet Take 0.1 mg by mouth every evening.  03/24/13  Yes [provider]  fluticasone (FLONASE) 50 MCG/ACT nasal spray Place 2 sprays into both nostrils daily. 09/15/15  Yes Dana Allan I, MD  hydrALAZINE (APRESOLINE) 50 MG tablet Take 50 mg by mouth every evening.  04/14/13  Yes [provider]  multivitamin (RENA-VIT) TABS tablet Take 1 tablet by mouth at bedtime. 09/15/15  Yes Bonnell Public, MD  tamoxifen (NOLVADEX) 20 MG tablet Take 1 tablet (20 mg total) by mouth daily. Patient taking differently: Take 20 mg by mouth every evening.  02/12/17  Yes Causey, Charlestine Massed, NP  hydrOXYzine (ATARAX/VISTARIL) 25 MG tablet Take 1 tablet (25 mg total) by mouth every 8 (eight) hours as needed for itching. Patient not taking: Reported on 08/22/2017 09/15/15   Bonnell Public, MD   Current Facility-Administered Medications  Medication Dose Route Frequency Provider Last Rate Last Dose  . cloNIDine (CATAPRES - Dosed in mg/24 hr) patch 0.1 mg  0.1 mg Transdermal Q Wed Rise Patience, MD   0.1 mg at 08/22/17 0335  . fentaNYL (SUBLIMAZE) 100 MCG/2ML injection           . hydrALAZINE (APRESOLINE) injection 10 mg  10 mg Intravenous Q4H PRN Rise Patience, MD      . morphine 4 MG/ML injection 1 mg  1 mg Intravenous Q4H PRN Rise Patience, MD   1 mg at 08/22/17 4854  . ondansetron (ZOFRAN) tablet 4 mg  4 mg Oral Q6H PRN Rise Patience, MD       Or  . ondansetron [facility]) injection 4 mg  4 mg Intravenous Q6H PRN Rise Patience, MD      . piperacillin-tazobactam (ZOSYN) IVPB 3.375 g  3.375 g Intravenous Q12H Rise Patience, MD 12.5 mL/hr at 08/22/17 0948 3.375 g at 08/22/17 0948   Labs: Basic Metabolic Panel: Recent Labs  Lab 09/11/2017 1618 08/22/17 0636  NA 136 139  K 2.8* 3.7  CL 100* 93*  CO2 27 31  GLUCOSE 155* 97  BUN 9 18  CREATININE 4.43* 5.55*  CALCIUM 7.8* 8.8*   Liver Function Tests: Recent Labs  Lab 09/14/2017 1618  AST 23  ALT 12*  ALKPHOS 66  BILITOT 1.0  PROT 7.3  ALBUMIN 3.6   Recent Labs  Lab 09/08/2017 1618  LIPASE 36   CBC: Recent Labs  Lab 08/27/2017 1618 08/22/17 0636  WBC 11.8* 14.9*  NEUTROABS 10.9*  --   HGB 10.7* 10.5*  HCT 33.5* 32.8*  MCV 93.1 93.2  PLT 216 199   CBG: Recent Labs  Lab 08/22/17 0536  GLUCAP 101*   Studies/Results: Ct Abdomen Pelvis Wo Contrast  Result Date:  08/22/2017 CLINICAL DATA:  Right lower quadrant pain. EXAM: CT ABDOMEN AND PELVIS WITHOUT CONTRAST TECHNIQUE: Multidetector CT imaging of the abdomen and pelvis was performed following the standard protocol without IV contrast. COMPARISON:  Lumbar spine x-rays dated September 13, 2015. CT abdomen pelvis dated March 31, 2010. FINDINGS: Lower chest: No acute abnormality. Hepatobiliary: No focal liver abnormality. The gallbladder is distended and contains multiple small gallstones, one of which is seen in the gallbladder neck. Mild gallbladder wall thickening with surrounding pericholecystic fluid. Mild dilatation of the common bile duct, measuring up to 10 mm. Pancreas: Mild atrophy. No ductal dilatation or surrounding inflammatory changes. Spleen: Normal in size without focal abnormality. Adrenals/Urinary Tract: Adrenal glands are  unremarkable. Severe atrophy of the bilateral kidneys. No renal or ureteral calculi. No hydronephrosis. The bladder is unremarkable. Stomach/Bowel: Small hiatal hernia. The stomach is distended with oral contrast. No evidence of bowel wall thickening, distention, or surrounding inflammatory changes. Extensive colonic diverticulosis. Vascular/Lymphatic: Aortic atherosclerosis. No enlarged abdominal or pelvic lymph nodes. Reproductive: Status post hysterectomy. No adnexal masses. Other: No abdominal wall hernia or abnormality. Trace free fluid in the pelvis. No pneumoperitoneum. Musculoskeletal: Mild superior endplate compression deformities of L1 and L2 appear chronic but are new when compared to prior study. IMPRESSION: 1. Prominently distended gallbladder with a gallstone lodged in the gallbladder neck. Mild gallbladder wall thickening and surrounding pericholecystic fluid. Findings are concerning for acute cholecystitis. 2. Mild dilatation of the common bile duct, measuring up to 10 mm. No obstructing mass or stones seen. 3.  Aortic atherosclerosis (ICD10-I70.0). Electronically Signed   By:  Titus Dubin M.D.   On: 08/22/2017 00:03   Dialysis Orders:  MWF at [facility] 3:45hr, 160dialyzer, 400/A1.5, EDW 61.5kg, 2K/2.25Ca, UF #4, AVG, heparin 4000 bolus - Hectoral 81mcg IV q HD - Mircera 55mcg IV q 2 weeks (last 3/27)  Assessment/Plan: 1.  Acute cholecystitis: Pt does not seem againt surgery now, d/w hospitalist who will go see her. I suspect she will elect to proceed with cholecystectomy. On Zosyn. 2.  ESRD:  Usual MWF schedule. Next due on 4/5. No heparin. 3.  Hypertension/volume: BP variable. No volume on exam. UF as tolerated. 4.  Anemia: Hgb ok for now, follow. 5.  Metabolic bone disease: Ca ok. Continue home binders once eating.  Veneta Penton, PA-C 08/22/2017, 10:33 AM  [facility] Pager: [phone]  Pt seen, examined and agree w A/P as above.  Kelly Splinter MD [employer] pager [phone]   08/22/2017, 2:46 PM

## 2017-08-22 NOTE — ED Notes (Signed)
Report given to [facility] on 6N

## 2017-08-22 NOTE — ED Notes (Signed)
Patient c/o nausea since coming back from CT.  Family remains at bedside

## 2017-08-22 NOTE — Progress Notes (Signed)
Per portable personnel, no SCD available at this time.

## 2017-08-23 LAB — CBC
HEMATOCRIT: 28 % — AB (ref 36.0–46.0)
Hemoglobin: 9 g/dL — ABNORMAL LOW (ref 12.0–15.0)
MCH: 29 pg (ref 26.0–34.0)
MCHC: 32.1 g/dL (ref 30.0–36.0)
MCV: 90.3 fL (ref 78.0–100.0)
PLATELETS: 191 10*3/uL (ref 150–400)
RBC: 3.1 MIL/uL — AB (ref 3.87–5.11)
RDW: 15.1 % (ref 11.5–15.5)
WBC: 13.2 10*3/uL — ABNORMAL HIGH (ref 4.0–10.5)

## 2017-08-23 LAB — GLUCOSE, CAPILLARY
GLUCOSE-CAPILLARY: 101 mg/dL — AB (ref 65–99)
GLUCOSE-CAPILLARY: 86 mg/dL (ref 65–99)
GLUCOSE-CAPILLARY: 94 mg/dL (ref 65–99)
Glucose-Capillary: 68 mg/dL (ref 65–99)

## 2017-08-23 LAB — RENAL FUNCTION PANEL
Albumin: 2.6 g/dL — ABNORMAL LOW (ref 3.5–5.0)
Anion gap: 14 (ref 5–15)
BUN: 11 mg/dL (ref 6–20)
CO2: 28 mmol/L (ref 22–32)
CREATININE: 3.63 mg/dL — AB (ref 0.44–1.00)
Calcium: 8.2 mg/dL — ABNORMAL LOW (ref 8.9–10.3)
Chloride: 94 mmol/L — ABNORMAL LOW (ref 101–111)
GFR calc Af Amer: 12 mL/min — ABNORMAL LOW (ref >60)
GFR, EST NON AFRICAN AMERICAN: 11 mL/min — AB (ref >60)
GLUCOSE: 90 mg/dL (ref 65–99)
POTASSIUM: 4.1 mmol/L (ref 3.5–5.1)
Phosphorus: 3 mg/dL (ref 2.5–4.6)
Sodium: 136 mmol/L (ref 135–145)

## 2017-08-23 LAB — COMPREHENSIVE METABOLIC PANEL
ALT: 69 U/L — AB (ref 14–54)
AST: 91 U/L — ABNORMAL HIGH (ref 15–41)
Albumin: 2.6 g/dL — ABNORMAL LOW (ref 3.5–5.0)
Alkaline Phosphatase: 127 U/L — ABNORMAL HIGH (ref 38–126)
Anion gap: 15 (ref 5–15)
BILIRUBIN TOTAL: 1.6 mg/dL — AB (ref 0.3–1.2)
BUN: 32 mg/dL — AB (ref 6–20)
CHLORIDE: 90 mmol/L — AB (ref 101–111)
CO2: 27 mmol/L (ref 22–32)
CREATININE: 7.62 mg/dL — AB (ref 0.44–1.00)
Calcium: 8.2 mg/dL — ABNORMAL LOW (ref 8.9–10.3)
GFR, EST AFRICAN AMERICAN: 5 mL/min — AB (ref >60)
GFR, EST NON AFRICAN AMERICAN: 4 mL/min — AB (ref >60)
Glucose, Bld: 69 mg/dL (ref 65–99)
Potassium: 3.6 mmol/L (ref 3.5–5.1)
Sodium: 132 mmol/L — ABNORMAL LOW (ref 135–145)
Total Protein: 6.1 g/dL — ABNORMAL LOW (ref 6.5–8.1)

## 2017-08-23 LAB — PHOSPHORUS: PHOSPHORUS: 5.9 mg/dL — AB (ref 2.5–4.6)

## 2017-08-23 MED ORDER — CHLORHEXIDINE GLUCONATE CLOTH 2 % EX PADS
6.0000 | MEDICATED_PAD | Freq: Once | CUTANEOUS | Status: AC
  Administered 2017-08-24: 6 via TOPICAL

## 2017-08-23 MED ORDER — LIDOCAINE-PRILOCAINE 2.5-2.5 % EX CREA: 1.0000 "application " | TOPICAL_CREAM | CUTANEOUS | Status: DC | PRN

## 2017-08-23 MED ORDER — LIDOCAINE HCL (PF) 1 % IJ SOLN: 5.0000 mL | INTRAMUSCULAR | Status: DC | PRN

## 2017-08-23 MED ORDER — SODIUM CHLORIDE 0.9 % IV SOLN: 100.0000 mL | INTRAVENOUS | Status: DC | PRN

## 2017-08-23 MED ORDER — CHLORHEXIDINE GLUCONATE CLOTH 2 % EX PADS: 6.0000 | MEDICATED_PAD | Freq: Once | CUTANEOUS | Status: AC

## 2017-08-23 MED ORDER — DEXTROSE 50 % IV SOLN
INTRAVENOUS | Status: AC
  Administered 2017-08-23: 25 mL
  Filled 2017-08-23: qty 50

## 2017-08-23 MED ORDER — PENTAFLUOROPROP-TETRAFLUOROETH EX AERO: 1.0000 "application " | INHALATION_SPRAY | CUTANEOUS | Status: DC | PRN

## 2017-08-23 NOTE — Progress Notes (Signed)
[facility] Progress Note  Subjective: is on HD , no new c/o  Vitals:   08/23/17 1000 08/23/17 1030 08/23/17 1049 08/23/17 1051  BP: (!) 96/53  (!) 112/52   Pulse: 93 84 89   Resp:      Temp:  97.8 F (36.6 C)    TempSrc:  Oral    SpO2:   96%   Weight:    60.5 kg (133 lb 6.1 oz)    Inpatient medications: . cloNIDine  0.1 mg Transdermal Q Wed   . piperacillin-tazobactam (ZOSYN)  IV 3.375 g (08/23/17 1137)   hydrALAZINE, morphine injection, ondansetron **OR** ondansetron (ZOFRAN) IV  Exam: General: Well developed, elderly woman, nad Neck: JVD not elevated. Lungs: Clear bilaterally to auscultation Heart: RRR, 2/6 systolic murmur Abdomen: Soft, mild tenderness in mid R abdomen without guarding. Lower extremities: No edema or ischemic changes, no open wounds. Neuro: Alert and oriented X 3. Moves all extremities spontaneously. Dialysis Access: L AVG + bruit (bandage still in place)    Dialysis: [facility] 3h 74min 400/A1.5   61.5kg  2/2.25 bath  P4  AVG LUE   Hep 4000 - Hectoral 62mcg IV q HD - Mircera 81mcg IV q 2 weeks (last 3/27)  Assessment/Plan: 1.  Acute cholecystitis: on zosyn IV, for surgery tomorrow (Sat).  2.  ESRD:  Usual MWF schedule. HD today, min UF, no heparin 3.  Hypertension/volume: BP variable. No volume on exam. 4.  Anemia: Hgb ok for now, follow. 5.  Metabolic bone disease: Ca ok. Continue home binders once eating.   Kelly Splinter MD [facility] pager [phone]   08/23/2017, 11:44 AM   Recent Labs  Lab 09/14/2017 1618 08/22/17 0636 08/23/17 0700  NA 136 139 132*  K 2.8* 3.7 3.6  CL 100* 93* 90*  CO2 27 31 27   GLUCOSE 155* 97 69  BUN 9 18 32*  CREATININE 4.43* 5.55* 7.62*  CALCIUM 7.8* 8.8* 8.2*  PHOS  --   --  5.9*   Recent Labs  Lab 09/02/2017 1618 08/23/17 0700  AST 23 91*  ALT 12* 69*  ALKPHOS 66 127*  BILITOT 1.0 1.6*  PROT 7.3 6.1*  ALBUMIN 3.6 2.6*   Recent Labs  Lab 08/28/2017 1618  08/22/17 0636 08/23/17 0700  WBC 11.8* 14.9* 13.2*  NEUTROABS 10.9*  --   --   HGB 10.7* 10.5* 9.0*  HCT 33.5* 32.8* 28.0*  MCV 93.1 93.2 90.3  PLT 216 199 191   Iron/TIBC/Ferritin/ %Sat    Component Value Date/Time   IRON 84 04/15/2010 1030   TIBC 178 (L) 04/15/2010 1030   FERRITIN 784 (H) 04/15/2010 1030   IRONPCTSAT 47 04/15/2010 1030

## 2017-08-23 NOTE — H&P (View-Only) (Signed)
[facility] Progress Note     Subjective: CC- abdominal pain Patient just returned to her room after HD this morning. Son at bedside. Overall abdominal pain about the same as yesterday, no better no worse. Pain is RUQ/RLQ. Denies n/v.  Patient and her 2 sons talked last night and they feel that HD and OR in the same day would be too much for Pam Fuller; requesting to delay surgery until tomorrow.  Objective: Vital signs in last 24 hours: Temp:  [97.8 F (36.6 C)-100.7 F (38.2 C)] 97.8 F (36.6 C) (04/05 1030) Pulse Rate:  [74-93] 89 (04/05 1049) Resp:  [16-18] 16 (04/05 0640) BP: (92-131)/(47-69) 112/52 (04/05 1049) SpO2:  [94 %-97 %] 96 % (04/05 1049) Weight:  [133 lb 6.1 oz (60.5 kg)-141 lb 1.5 oz (64 kg)] 133 lb 6.1 oz (60.5 kg) (04/05 1051) Last BM Date: 08/23/2017  Intake/Output from previous day: 04/04 0701 - 04/05 0700 In: 1010 [P.O.:960; IV Piggyback:50] Out: -  Intake/Output this shift: Total I/O In: -  Out: 794 [Other:794]  PE: Gen:  Alert, NAD, pleasant HEENT: EOM's intact, pupils equal and round Card:  RRR, no M/G/R heard Pulm:  CTAB, no W/R/R, effort normal Abd: Soft, ND, +BS, no HSM, no hernia, +TTP RUQ/epigastric region with guarding no rebound Ext: calves soft and nontender Skin: no rashes noted, warm and dry  Lab Results:  Recent Labs    08/22/17 0636 08/23/17 0700  WBC 14.9* 13.2*  HGB 10.5* 9.0*  HCT 32.8* 28.0*  PLT 199 191   BMET Recent Labs    08/22/17 0636 08/23/17 0700  NA 139 132*  K 3.7 3.6  CL 93* 90*  CO2 31 27  GLUCOSE 97 69  BUN 18 32*  CREATININE 5.55* 7.62*  CALCIUM 8.8* 8.2*   PT/INR No results for input(s): LABPROT, INR in the last 72 hours. CMP     Component Value Date/Time   NA 132 (L) 08/23/2017 0700   NA 142 02/12/2017 1400   K 3.6 08/23/2017 0700   K 3.9 02/12/2017 1400   CL 90 (L) 08/23/2017 0700   CO2 27 08/23/2017 0700   CO2 30 (H) 02/12/2017 1400   GLUCOSE 69 08/23/2017 0700   GLUCOSE  88 02/12/2017 1400   BUN 32 (H) 08/23/2017 0700   BUN 16.9 02/12/2017 1400   CREATININE 7.62 (H) 08/23/2017 0700   CREATININE 6.2 (HH) 02/12/2017 1400   CALCIUM 8.2 (L) 08/23/2017 0700   CALCIUM 9.2 02/12/2017 1400   PROT 6.1 (L) 08/23/2017 0700   PROT 6.8 02/12/2017 1400   ALBUMIN 2.6 (L) 08/23/2017 0700   ALBUMIN 3.3 (L) 02/12/2017 1400   AST 91 (H) 08/23/2017 0700   AST 13 02/12/2017 1400   ALT 69 (H) 08/23/2017 0700   ALT 7 02/12/2017 1400   ALKPHOS 127 (H) 08/23/2017 0700   ALKPHOS 75 02/12/2017 1400   BILITOT 1.6 (H) 08/23/2017 0700   BILITOT 0.68 02/12/2017 1400   GFRNONAA 4 (L) 08/23/2017 0700   GFRAA 5 (L) 08/23/2017 0700   Lipase     Component Value Date/Time   LIPASE 36 09/15/2017 1618       Studies/Results: Ct Abdomen Pelvis Wo Contrast  Result Date: 08/22/2017 CLINICAL DATA:  Right lower quadrant pain. EXAM: CT ABDOMEN AND PELVIS WITHOUT CONTRAST TECHNIQUE: Multidetector CT imaging of the abdomen and pelvis was performed following the standard protocol without IV contrast. COMPARISON:  Lumbar spine x-rays dated September 13, 2015. CT abdomen pelvis dated March 31, 2010.  FINDINGS: Lower chest: No acute abnormality. Hepatobiliary: No focal liver abnormality. The gallbladder is distended and contains multiple small gallstones, one of which is seen in the gallbladder neck. Mild gallbladder wall thickening with surrounding pericholecystic fluid. Mild dilatation of the common bile duct, measuring up to 10 mm. Pancreas: Mild atrophy. No ductal dilatation or surrounding inflammatory changes. Spleen: Normal in size without focal abnormality. Adrenals/Urinary Tract: Adrenal glands are unremarkable. Severe atrophy of the bilateral kidneys. No renal or ureteral calculi. No hydronephrosis. The bladder is unremarkable. Stomach/Bowel: Small hiatal hernia. The stomach is distended with oral contrast. No evidence of bowel wall thickening, distention, or surrounding inflammatory changes.  Extensive colonic diverticulosis. Vascular/Lymphatic: Aortic atherosclerosis. No enlarged abdominal or pelvic lymph nodes. Reproductive: Status post hysterectomy. No adnexal masses. Other: No abdominal wall hernia or abnormality. Trace free fluid in the pelvis. No pneumoperitoneum. Musculoskeletal: Mild superior endplate compression deformities of L1 and L2 appear chronic but are new when compared to prior study. IMPRESSION: 1. Prominently distended gallbladder with a gallstone lodged in the gallbladder neck. Mild gallbladder wall thickening and surrounding pericholecystic fluid. Findings are concerning for acute cholecystitis. 2. Mild dilatation of the common bile duct, measuring up to 10 mm. No obstructing mass or stones seen. 3.  Aortic atherosclerosis (ICD10-I70.0). Electronically Signed   By: Titus Dubin M.D.   On: 08/22/2017 00:03    Anti-infectives: Anti-infectives (From admission, onward)   Start     Dose/Rate Route Frequency Ordered Stop   08/22/17 1000  piperacillin-tazobactam (ZOSYN) IVPB 3.375 g    Note to Pharmacy:  Zosyn 3.375 g IV q12h in ESRD on HD   3.375 g 12.5 mL/hr over 240 Minutes Intravenous Every 12 hours 08/22/17 0128     08/22/17 0045  piperacillin-tazobactam (ZOSYN) IVPB 3.375 g     3.375 g 12.5 mL/hr over 240 Minutes Intravenous  Once 08/22/17 0038 08/22/17 0145       Assessment/Plan HTN ESRD - HD MWF H/o breast cancer  Acute cholecystitis - CT shows prominently distended gallbladder with a gallstone lodged in the  gallbladder neck, mild gallbladder wall thickening and surrounding pericholecystic fluid; mild dilatation of the common bile duct measuring up to 10 mm - on admission WBC 11.8, LFTs WNL, lipase 36 - LFTs slightly up today with bilirubin 1.6  ID - zosyn 4/4>> FEN - IVF, clear liquids, NPO after MN VTE - SCDs Foley - none Follow up - TBD  Plan - Plan for laparoscopic cholecystectomy tomorrow. Ok for liquids today, NPO after midnight. Labs in  AM.   LOS: 1 day    Wellington Hampshire , [facility] 08/23/2017, 11:34 AM Pager: [phone] Consults: [phone] Mon-Fri 7:00 am-4:30 pm Sat-Sun 7:00 am-11:30 am

## 2017-08-23 NOTE — Progress Notes (Signed)
Patient ID: Pam Fuller, female   DOB: [birth year], 84 y.o.   MRN: [id number]  PROGRESS NOTE    Pam Fuller  [id number]:[id number] DOB: 04/26/1933 DOA: 09/01/2017 PCP: Patient, No Pcp Per   Outpatient Specialists: Dr. Lurline Del Oncology   Brief Narrative:  This is an 84 Yo with hx of Breast cancer and ESRD on HD MWF admitted with acute cholecystitis. Patient has been reluctant to have any surgical intervention in the beginning and has been on medical management but now agrees to have Surgical intervention. She has had CT abdomen in the ER confirming acute cholecystitis.  Assessment & Plan:   Principal Problem:   Acute cholecystitis Active Problems:   ESRD on dialysis [facility])   Essential hypertension   GERD (gastroesophageal reflux disease)   1. Acute cholecystitis: patient will have Lap chole in am. Has had HD today and stable. No complaint.  2. ESRD: HD today. Continue per Nephrology  3. HTN: Controlled  4. Hx of Breast cancer: Stable. Follow up with oncology  5. Anemia of chronic dz: stable H/H. Watch for post-op anemia.  DVT prophylaxis: SCDs. Code Status: Full code. Family Communication: Patient's family at the bedside. Disposition Plan: Home. Consults called:  General surgery, Dr . Gavin Pound. Dr Roney Jaffe Nephrology   Antimicrobials:  -Zosyn 4/4 >>>  Subjective: Patient doing better today but still has RUQ pain at 4/10. Had HD and no issues.  Objective: Vitals:   08/23/17 1049 08/23/17 1051 08/23/17 1459 08/23/17 1500  BP: (!) 112/52  (!) 100/52 (!) 100/52  Pulse: 89  89 89  Resp:   13 13  Temp:   99.8 F (37.7 C) 99.8 F (37.7 C)  TempSrc:   Oral Oral  SpO2: 96%  96% 97%  Weight:  60.5 kg (133 lb 6.1 oz)      Intake/Output Summary (Last 24 hours) at 08/23/2017 1930 Last data filed at 08/23/2017 1030 Gross per 24 hour  Intake -  Output 794 ml  Net -794 ml   Filed Weights   08/22/17 2157 08/23/17 0640 08/23/17 1051  Weight: 64 kg  (141 lb 1.5 oz) 61.2 kg (134 lb 14.7 oz) 60.5 kg (133 lb 6.1 oz)    Examination:  General exam: Appears calm and comfortable  Respiratory system: Clear to auscultation. Respiratory effort normal. Cardiovascular system: S1 & S2 heard, RRR. No JVD, murmurs, rubs, gallops or clicks. No pedal edema. Gastrointestinal system: Abdomen is nondistended, soft and nontender. No organomegaly or masses felt. Normal bowel sounds heard. Central nervous system: Alert and oriented. No focal neurological deficits. Extremities: Symmetric 5 x 5 power. Skin: No rashes, lesions or ulcers Psychiatry: Judgement and insight appear normal. Mood & affect appropriate.     Data Reviewed: I have personally reviewed following labs and imaging studies  CBC: Recent Labs  Lab 09/02/2017 1618 08/22/17 0636 08/23/17 0700  WBC 11.8* 14.9* 13.2*  NEUTROABS 10.9*  --   --   HGB 10.7* 10.5* 9.0*  HCT 33.5* 32.8* 28.0*  MCV 93.1 93.2 90.3  PLT 216 199 474   Basic Metabolic Panel: Recent Labs  Lab 09/17/2017 1618 08/22/17 0636 08/23/17 0700 08/23/17 1438  NA 136 139 132* 136  K 2.8* 3.7 3.6 4.1  CL 100* 93* 90* 94*  CO2 27 31 27 28   GLUCOSE 155* 97 69 90  BUN 9 18 32* 11  CREATININE 4.43* 5.55* 7.62* 3.63*  CALCIUM 7.8* 8.8* 8.2* 8.2*  PHOS  --   --  5.9*  3.0   GFR: Estimated Creatinine Clearance: 10.2 mL/min (A) (by C-G formula based on SCr of 3.63 mg/dL (H)). Liver Function Tests: Recent Labs  Lab 09/07/2017 1618 08/23/17 0700 08/23/17 1438  AST 23 91*  --   ALT 12* 69*  --   ALKPHOS 66 127*  --   BILITOT 1.0 1.6*  --   PROT 7.3 6.1*  --   ALBUMIN 3.6 2.6* 2.6*   Recent Labs  Lab 09/14/2017 1618  LIPASE 36   No results for input(s): AMMONIA in the last 168 hours. Coagulation Profile: No results for input(s): INR, PROTIME in the last 168 hours. Cardiac Enzymes: No results for input(s): CKTOTAL, CKMB, CKMBINDEX, TROPONINI in the last 168 hours. BNP (last 3 results) No results for input(s):  PROBNP in the last 8760 hours. HbA1C: No results for input(s): HGBA1C in the last 72 hours. CBG: Recent Labs  Lab 08/22/17 1933 08/23/17 0002 08/23/17 0540 08/23/17 0602 08/23/17 1757  GLUCAP 92 86 68 101* 94   Lipid Profile: No results for input(s): CHOL, HDL, LDLCALC, TRIG, CHOLHDL, LDLDIRECT in the last 72 hours. Thyroid Function Tests: No results for input(s): TSH, T4TOTAL, FREET4, T3FREE, THYROIDAB in the last 72 hours. Anemia Panel: No results for input(s): VITAMINB12, FOLATE, FERRITIN, TIBC, IRON, RETICCTPCT in the last 72 hours. Urine analysis:    Component Value Date/Time   COLORURINE STRAW (A) 08/26/2017 2237   APPEARANCEUR CLEAR 09/14/2017 2237   LABSPEC 1.006 08/20/2017 2237   PHURINE 9.0 (H) 08/22/2017 2237   GLUCOSEU 150 (A) 08/20/2017 2237   HGBUR SMALL (A) 09/16/2017 2237   BILIRUBINUR NEGATIVE 09/06/2017 2237   KETONESUR NEGATIVE 08/31/2017 2237   PROTEINUR 30 (A) 09/08/2017 2237   UROBILINOGEN 0.2 03/31/2010 1319   NITRITE NEGATIVE 08/28/2017 2237   LEUKOCYTESUR NEGATIVE 09/09/2017 2237   Sepsis Labs: @LABRCNTIP (procalcitonin:4,lacticidven:4)  ) Recent Results (from the past 240 hour(s))  Surgical pcr screen     Status: None   Collection Time: 08/22/17  4:03 AM  Result Value Ref Range Status   MRSA, PCR NEGATIVE NEGATIVE Final   Staphylococcus aureus NEGATIVE NEGATIVE Final    Comment: (NOTE) The Xpert SA Assay (FDA approved for NASAL specimens in patients 20 years of age and older), is one component of a comprehensive surveillance program. It is not intended to diagnose infection nor to guide or monitor treatment. Performed at [facility], [place] [address], [place], [place] [address]          Radiology Studies: Ct Abdomen Pelvis Wo Contrast  Result Date: 08/22/2017 CLINICAL DATA:  Right lower quadrant pain. EXAM: CT ABDOMEN AND PELVIS WITHOUT CONTRAST TECHNIQUE: Multidetector CT imaging of the abdomen and pelvis was performed  following the standard protocol without IV contrast. COMPARISON:  Lumbar spine x-rays dated September 13, 2015. CT abdomen pelvis dated March 31, 2010. FINDINGS: Lower chest: No acute abnormality. Hepatobiliary: No focal liver abnormality. The gallbladder is distended and contains multiple small gallstones, one of which is seen in the gallbladder neck. Mild gallbladder wall thickening with surrounding pericholecystic fluid. Mild dilatation of the common bile duct, measuring up to 10 mm. Pancreas: Mild atrophy. No ductal dilatation or surrounding inflammatory changes. Spleen: Normal in size without focal abnormality. Adrenals/Urinary Tract: Adrenal glands are unremarkable. Severe atrophy of the bilateral kidneys. No renal or ureteral calculi. No hydronephrosis. The bladder is unremarkable. Stomach/Bowel: Small hiatal hernia. The stomach is distended with oral contrast. No evidence of bowel wall thickening, distention, or surrounding inflammatory changes. Extensive colonic  diverticulosis. Vascular/Lymphatic: Aortic atherosclerosis. No enlarged abdominal or pelvic lymph nodes. Reproductive: Status post hysterectomy. No adnexal masses. Other: No abdominal wall hernia or abnormality. Trace free fluid in the pelvis. No pneumoperitoneum. Musculoskeletal: Mild superior endplate compression deformities of L1 and L2 appear chronic but are new when compared to prior study. IMPRESSION: 1. Prominently distended gallbladder with a gallstone lodged in the gallbladder neck. Mild gallbladder wall thickening and surrounding pericholecystic fluid. Findings are concerning for acute cholecystitis. 2. Mild dilatation of the common bile duct, measuring up to 10 mm. No obstructing mass or stones seen. 3.  Aortic atherosclerosis (ICD10-I70.0). Electronically Signed   By: Titus Dubin M.D.   On: 08/22/2017 00:03        Scheduled Meds: . Chlorhexidine Gluconate Cloth  6 each Topical Once   And  . Chlorhexidine Gluconate Cloth  6  each Topical Once  . cloNIDine  0.1 mg Transdermal Q Wed   Continuous Infusions: . piperacillin-tazobactam (ZOSYN)  IV Stopped (08/23/17 1537)     LOS: 1 day    Time spent: 26 minutes    Estephan Gallardo,LAWAL, MD [facility] Pager [phone] If 7PM-7AM, please contact night-coverage [email or web address] Password [email or web address] 08/23/2017, 7:30 PM

## 2017-08-23 NOTE — Progress Notes (Signed)
[facility] Progress Note     Subjective: CC- abdominal pain Patient just returned to her room after HD this morning. Son at bedside. Overall abdominal pain about the same as yesterday, no better no worse. Pain is RUQ/RLQ. Denies n/v.  Patient and her 2 sons talked last night and they feel that HD and OR in the same day would be too much for [name]; requesting to delay surgery until tomorrow.  Objective: Vital signs in last 24 hours: Temp:  [97.8 F (36.6 C)-100.7 F (38.2 C)] 97.8 F (36.6 C) (04/05 1030) Pulse Rate:  [74-93] 89 (04/05 1049) Resp:  [16-18] 16 (04/05 0640) BP: (92-131)/(47-69) 112/52 (04/05 1049) SpO2:  [94 %-97 %] 96 % (04/05 1049) Weight:  [133 lb 6.1 oz (60.5 kg)-141 lb 1.5 oz (64 kg)] 133 lb 6.1 oz (60.5 kg) (04/05 1051) Last BM Date: 09/13/2017  Intake/Output from previous day: 04/04 0701 - 04/05 0700 In: 1010 [P.O.:960; IV Piggyback:50] Out: -  Intake/Output this shift: Total I/O In: -  Out: 794 [Other:794]  PE: Gen:  Alert, NAD, pleasant HEENT: EOM's intact, pupils equal and round Card:  RRR, no M/G/R heard Pulm:  CTAB, no W/R/R, effort normal Abd: Soft, ND, +BS, no HSM, no hernia, +TTP RUQ/epigastric region with guarding no rebound Ext: calves soft and nontender Skin: no rashes noted, warm and dry  Lab Results:  Recent Labs    08/22/17 0636 08/23/17 0700  WBC 14.9* 13.2*  HGB 10.5* 9.0*  HCT 32.8* 28.0*  PLT 199 191   BMET Recent Labs    08/22/17 0636 08/23/17 0700  NA 139 132*  K 3.7 3.6  CL 93* 90*  CO2 31 27  GLUCOSE 97 69  BUN 18 32*  CREATININE 5.55* 7.62*  CALCIUM 8.8* 8.2*   PT/INR No results for input(s): LABPROT, INR in the last 72 hours. CMP     Component Value Date/Time   NA 132 (L) 08/23/2017 0700   NA 142 02/12/2017 1400   K 3.6 08/23/2017 0700   K 3.9 02/12/2017 1400   CL 90 (L) 08/23/2017 0700   CO2 27 08/23/2017 0700   CO2 30 (H) 02/12/2017 1400   GLUCOSE 69 08/23/2017 0700   GLUCOSE  88 02/12/2017 1400   BUN 32 (H) 08/23/2017 0700   BUN 16.9 02/12/2017 1400   CREATININE 7.62 (H) 08/23/2017 0700   CREATININE 6.2 (HH) 02/12/2017 1400   CALCIUM 8.2 (L) 08/23/2017 0700   CALCIUM 9.2 02/12/2017 1400   PROT 6.1 (L) 08/23/2017 0700   PROT 6.8 02/12/2017 1400   ALBUMIN 2.6 (L) 08/23/2017 0700   ALBUMIN 3.3 (L) 02/12/2017 1400   AST 91 (H) 08/23/2017 0700   AST 13 02/12/2017 1400   ALT 69 (H) 08/23/2017 0700   ALT 7 02/12/2017 1400   ALKPHOS 127 (H) 08/23/2017 0700   ALKPHOS 75 02/12/2017 1400   BILITOT 1.6 (H) 08/23/2017 0700   BILITOT 0.68 02/12/2017 1400   GFRNONAA 4 (L) 08/23/2017 0700   GFRAA 5 (L) 08/23/2017 0700   Lipase     Component Value Date/Time   LIPASE 36 08/23/2017 1618       Studies/Results: Ct Abdomen Pelvis Wo Contrast  Result Date: 08/22/2017 CLINICAL DATA:  Right lower quadrant pain. EXAM: CT ABDOMEN AND PELVIS WITHOUT CONTRAST TECHNIQUE: Multidetector CT imaging of the abdomen and pelvis was performed following the standard protocol without IV contrast. COMPARISON:  Lumbar spine x-rays dated September 13, 2015. CT abdomen pelvis dated March 31, 2010.  FINDINGS: Lower chest: No acute abnormality. Hepatobiliary: No focal liver abnormality. The gallbladder is distended and contains multiple small gallstones, one of which is seen in the gallbladder neck. Mild gallbladder wall thickening with surrounding pericholecystic fluid. Mild dilatation of the common bile duct, measuring up to 10 mm. Pancreas: Mild atrophy. No ductal dilatation or surrounding inflammatory changes. Spleen: Normal in size without focal abnormality. Adrenals/Urinary Tract: Adrenal glands are unremarkable. Severe atrophy of the bilateral kidneys. No renal or ureteral calculi. No hydronephrosis. The bladder is unremarkable. Stomach/Bowel: Small hiatal hernia. The stomach is distended with oral contrast. No evidence of bowel wall thickening, distention, or surrounding inflammatory changes.  Extensive colonic diverticulosis. Vascular/Lymphatic: Aortic atherosclerosis. No enlarged abdominal or pelvic lymph nodes. Reproductive: Status post hysterectomy. No adnexal masses. Other: No abdominal wall hernia or abnormality. Trace free fluid in the pelvis. No pneumoperitoneum. Musculoskeletal: Mild superior endplate compression deformities of L1 and L2 appear chronic but are new when compared to prior study. IMPRESSION: 1. Prominently distended gallbladder with a gallstone lodged in the gallbladder neck. Mild gallbladder wall thickening and surrounding pericholecystic fluid. Findings are concerning for acute cholecystitis. 2. Mild dilatation of the common bile duct, measuring up to 10 mm. No obstructing mass or stones seen. 3.  Aortic atherosclerosis (ICD10-I70.0). Electronically Signed   By: Titus Dubin M.D.   On: 08/22/2017 00:03    Anti-infectives: Anti-infectives (From admission, onward)   Start     Dose/Rate Route Frequency Ordered Stop   08/22/17 1000  piperacillin-tazobactam (ZOSYN) IVPB 3.375 g    Note to Pharmacy:  Zosyn 3.375 g IV q12h in ESRD on HD   3.375 g 12.5 mL/hr over 240 Minutes Intravenous Every 12 hours 08/22/17 0128     08/22/17 0045  piperacillin-tazobactam (ZOSYN) IVPB 3.375 g     3.375 g 12.5 mL/hr over 240 Minutes Intravenous  Once 08/22/17 0038 08/22/17 0145       Assessment/Plan HTN ESRD - HD MWF H/o breast cancer  Acute cholecystitis - CT shows prominently distended gallbladder with a gallstone lodged in the  gallbladder neck, mild gallbladder wall thickening and surrounding pericholecystic fluid; mild dilatation of the common bile duct measuring up to 10 mm - on admission WBC 11.8, LFTs WNL, lipase 36 - LFTs slightly up today with bilirubin 1.6  ID - zosyn 4/4>> FEN - IVF, clear liquids, NPO after MN VTE - SCDs Foley - none Follow up - TBD  Plan - Plan for laparoscopic cholecystectomy tomorrow. Ok for liquids today, NPO after midnight. Labs in  AM.   LOS: 1 day    Pam Fuller , [facility] 08/23/2017, 11:34 AM Pager: [phone] Consults: [phone] Mon-Fri 7:00 am-4:30 pm Sat-Sun 7:00 am-11:30 am

## 2017-08-24 ENCOUNTER — Inpatient Hospital Stay (HOSPITAL_COMMUNITY): Payer: Medicare Other | Admitting: Certified Registered"

## 2017-08-24 ENCOUNTER — Encounter (HOSPITAL_COMMUNITY): Payer: Self-pay | Admitting: Certified Registered"

## 2017-08-24 ENCOUNTER — Encounter (HOSPITAL_COMMUNITY): Admission: EM | Disposition: E | Payer: Self-pay | Source: Home / Self Care | Attending: Emergency Medicine

## 2017-08-24 HISTORY — PX: CHOLECYSTECTOMY: SHX55

## 2017-08-24 LAB — CBC
HEMATOCRIT: 25 % — AB (ref 36.0–46.0)
Hemoglobin: 8.3 g/dL — ABNORMAL LOW (ref 12.0–15.0)
MCH: 30.3 pg (ref 26.0–34.0)
MCHC: 33.2 g/dL (ref 30.0–36.0)
MCV: 91.2 fL (ref 78.0–100.0)
Platelets: 167 10*3/uL (ref 150–400)
RBC: 2.74 MIL/uL — ABNORMAL LOW (ref 3.87–5.11)
RDW: 15.4 % (ref 11.5–15.5)
WBC: 9.1 10*3/uL (ref 4.0–10.5)

## 2017-08-24 LAB — COMPREHENSIVE METABOLIC PANEL
ALT: 51 U/L (ref 14–54)
AST: 66 U/L — AB (ref 15–41)
Albumin: 2.5 g/dL — ABNORMAL LOW (ref 3.5–5.0)
Alkaline Phosphatase: 134 U/L — ABNORMAL HIGH (ref 38–126)
Anion gap: 16 — ABNORMAL HIGH (ref 5–15)
BILIRUBIN TOTAL: 1.9 mg/dL — AB (ref 0.3–1.2)
BUN: 21 mg/dL — AB (ref 6–20)
CALCIUM: 8.4 mg/dL — AB (ref 8.9–10.3)
CO2: 26 mmol/L (ref 22–32)
CREATININE: 5.25 mg/dL — AB (ref 0.44–1.00)
Chloride: 91 mmol/L — ABNORMAL LOW (ref 101–111)
GFR calc Af Amer: 8 mL/min — ABNORMAL LOW (ref >60)
GFR, EST NON AFRICAN AMERICAN: 7 mL/min — AB (ref >60)
Glucose, Bld: 69 mg/dL (ref 65–99)
POTASSIUM: 3.9 mmol/L (ref 3.5–5.1)
Sodium: 133 mmol/L — ABNORMAL LOW (ref 135–145)
TOTAL PROTEIN: 6.5 g/dL (ref 6.5–8.1)

## 2017-08-24 LAB — TYPE AND SCREEN
ABO/RH(D): O NEG
Antibody Screen: NEGATIVE

## 2017-08-24 LAB — GLUCOSE, CAPILLARY
Glucose-Capillary: 100 mg/dL — ABNORMAL HIGH (ref 65–99)
Glucose-Capillary: 70 mg/dL (ref 65–99)

## 2017-08-24 SURGERY — LAPAROSCOPIC CHOLECYSTECTOMY
Anesthesia: General | Site: Abdomen

## 2017-08-24 MED ORDER — 0.9 % SODIUM CHLORIDE (POUR BTL) OPTIME
TOPICAL | Status: DC | PRN
  Administered 2017-08-24: 1000 mL

## 2017-08-24 MED ORDER — ACETAMINOPHEN 650 MG RE SUPP: 650.0000 mg | Freq: Four times a day (QID) | RECTAL | Status: DC | PRN

## 2017-08-24 MED ORDER — DEXAMETHASONE SODIUM PHOSPHATE 10 MG/ML IJ SOLN
INTRAMUSCULAR | Status: AC
  Filled 2017-08-24: qty 1

## 2017-08-24 MED ORDER — SODIUM CHLORIDE 0.9 % IV SOLN
INTRAVENOUS | Status: DC | PRN
  Administered 2017-08-24 (×2): via INTRAVENOUS

## 2017-08-24 MED ORDER — OXYCODONE HCL 5 MG PO TABS: 5.0000 mg | ORAL_TABLET | Freq: Once | ORAL | Status: DC | PRN

## 2017-08-24 MED ORDER — GLYCOPYRROLATE 0.2 MG/ML IV SOSY
PREFILLED_SYRINGE | INTRAVENOUS | Status: DC | PRN
  Administered 2017-08-24: .4 mg via INTRAVENOUS

## 2017-08-24 MED ORDER — ONDANSETRON HCL 4 MG/2ML IJ SOLN
INTRAMUSCULAR | Status: AC
  Filled 2017-08-24: qty 2

## 2017-08-24 MED ORDER — PIPERACILLIN-TAZOBACTAM 3.375 G IVPB
3.3750 g | Freq: Two times a day (BID) | INTRAVENOUS | Status: AC
  Administered 2017-08-25 (×2): 3.375 g via INTRAVENOUS
  Filled 2017-08-24 (×2): qty 50

## 2017-08-24 MED ORDER — DOCUSATE SODIUM 100 MG PO CAPS
100.0000 mg | ORAL_CAPSULE | Freq: Two times a day (BID) | ORAL | Status: DC
  Administered 2017-08-24 – 2017-08-28 (×7): 100 mg via ORAL
  Filled 2017-08-24 (×8): qty 1

## 2017-08-24 MED ORDER — NEOSTIGMINE METHYLSULFATE 5 MG/5ML IV SOSY
PREFILLED_SYRINGE | INTRAVENOUS | Status: AC
  Filled 2017-08-24: qty 5

## 2017-08-24 MED ORDER — FENTANYL CITRATE (PF) 100 MCG/2ML IJ SOLN
INTRAMUSCULAR | Status: AC
  Administered 2017-08-24: 50 ug via INTRAVENOUS
  Filled 2017-08-24: qty 2

## 2017-08-24 MED ORDER — FENTANYL CITRATE (PF) 100 MCG/2ML IJ SOLN
25.0000 ug | INTRAMUSCULAR | Status: DC | PRN
  Administered 2017-08-24: 25 ug via INTRAVENOUS
  Administered 2017-08-24: 50 ug via INTRAVENOUS
  Administered 2017-08-24 (×3): 25 ug via INTRAVENOUS

## 2017-08-24 MED ORDER — MORPHINE SULFATE (PF) 2 MG/ML IV SOLN: 1.0000 mg | INTRAVENOUS | Status: DC | PRN

## 2017-08-24 MED ORDER — OXYCODONE HCL 5 MG/5ML PO SOLN: 5.0000 mg | Freq: Once | ORAL | Status: DC | PRN

## 2017-08-24 MED ORDER — ONDANSETRON HCL 4 MG/2ML IJ SOLN
INTRAMUSCULAR | Status: DC | PRN
  Administered 2017-08-24: 4 mg via INTRAVENOUS

## 2017-08-24 MED ORDER — DEXAMETHASONE SODIUM PHOSPHATE 10 MG/ML IJ SOLN
INTRAMUSCULAR | Status: DC | PRN
  Administered 2017-08-24: 10 mg via INTRAVENOUS

## 2017-08-24 MED ORDER — HEPARIN SODIUM (PORCINE) 5000 UNIT/ML IJ SOLN
5000.0000 [IU] | Freq: Three times a day (TID) | INTRAMUSCULAR | Status: DC
  Administered 2017-08-25 – 2017-08-28 (×10): 5000 [IU] via SUBCUTANEOUS
  Filled 2017-08-24 (×11): qty 1

## 2017-08-24 MED ORDER — ROCURONIUM BROMIDE 10 MG/ML (PF) SYRINGE
PREFILLED_SYRINGE | INTRAVENOUS | Status: DC | PRN
  Administered 2017-08-24 (×2): 10 mg via INTRAVENOUS
  Administered 2017-08-24: 30 mg via INTRAVENOUS

## 2017-08-24 MED ORDER — FENTANYL CITRATE (PF) 100 MCG/2ML IJ SOLN
INTRAMUSCULAR | Status: AC
  Administered 2017-08-24: 25 ug via INTRAVENOUS
  Filled 2017-08-24: qty 2

## 2017-08-24 MED ORDER — PROPOFOL 10 MG/ML IV BOLUS
INTRAVENOUS | Status: DC | PRN
  Administered 2017-08-24: 20 mg via INTRAVENOUS
  Administered 2017-08-24: 80 mg via INTRAVENOUS

## 2017-08-24 MED ORDER — HEMOSTATIC AGENTS (NO CHARGE) OPTIME
TOPICAL | Status: DC | PRN
  Administered 2017-08-24: 1 via TOPICAL

## 2017-08-24 MED ORDER — TRAMADOL HCL 50 MG PO TABS
50.0000 mg | ORAL_TABLET | Freq: Two times a day (BID) | ORAL | Status: DC | PRN
  Administered 2017-08-24 – 2017-08-25 (×2): 50 mg via ORAL
  Administered 2017-08-25 – 2017-08-26 (×2): 100 mg via ORAL
  Administered 2017-08-27: 50 mg via ORAL
  Filled 2017-08-24 (×3): qty 2
  Filled 2017-08-24: qty 1
  Filled 2017-08-24: qty 2
  Filled 2017-08-24: qty 1

## 2017-08-24 MED ORDER — ROCURONIUM BROMIDE 10 MG/ML (PF) SYRINGE
PREFILLED_SYRINGE | INTRAVENOUS | Status: AC
  Filled 2017-08-24: qty 5

## 2017-08-24 MED ORDER — BUPIVACAINE-EPINEPHRINE (PF) 0.25% -1:200000 IJ SOLN
INTRAMUSCULAR | Status: AC
  Filled 2017-08-24: qty 30

## 2017-08-24 MED ORDER — PHENYLEPHRINE HCL 10 MG/ML IJ SOLN
INTRAVENOUS | Status: DC | PRN
  Administered 2017-08-24: 50 ug/min via INTRAVENOUS

## 2017-08-24 MED ORDER — SODIUM CHLORIDE 0.9 % IR SOLN
Status: DC | PRN
  Administered 2017-08-24 (×2): 1000 mL

## 2017-08-24 MED ORDER — LIDOCAINE 2% (20 MG/ML) 5 ML SYRINGE
INTRAMUSCULAR | Status: DC | PRN
  Administered 2017-08-24: 50 mg via INTRAVENOUS

## 2017-08-24 MED ORDER — MORPHINE SULFATE (PF) 4 MG/ML IV SOLN
INTRAVENOUS | Status: AC
  Administered 2017-08-24: 1 mg via INTRAVENOUS
  Filled 2017-08-24: qty 1

## 2017-08-24 MED ORDER — NEOSTIGMINE METHYLSULFATE 5 MG/5ML IV SOSY
PREFILLED_SYRINGE | INTRAVENOUS | Status: DC | PRN
  Administered 2017-08-24: 3 mg via INTRAVENOUS

## 2017-08-24 MED ORDER — PROPOFOL 10 MG/ML IV BOLUS
INTRAVENOUS | Status: AC
  Filled 2017-08-24: qty 20

## 2017-08-24 MED ORDER — ACETAMINOPHEN 325 MG PO TABS
650.0000 mg | ORAL_TABLET | Freq: Four times a day (QID) | ORAL | Status: DC | PRN
  Administered 2017-08-25 – 2017-08-26 (×2): 650 mg via ORAL
  Filled 2017-08-24 (×3): qty 2

## 2017-08-24 MED ORDER — BUPIVACAINE-EPINEPHRINE 0.25% -1:200000 IJ SOLN
INTRAMUSCULAR | Status: DC | PRN
  Administered 2017-08-24: 21 mL

## 2017-08-24 MED ORDER — FENTANYL CITRATE (PF) 250 MCG/5ML IJ SOLN
INTRAMUSCULAR | Status: AC
  Filled 2017-08-24: qty 5

## 2017-08-24 MED ORDER — IOPAMIDOL (ISOVUE-300) INJECTION 61%
INTRAVENOUS | Status: AC
  Filled 2017-08-24: qty 50

## 2017-08-24 MED ORDER — FENTANYL CITRATE (PF) 250 MCG/5ML IJ SOLN
INTRAMUSCULAR | Status: DC | PRN
  Administered 2017-08-24: 50 ug via INTRAVENOUS
  Administered 2017-08-24: 25 ug via INTRAVENOUS
  Administered 2017-08-24 (×2): 50 ug via INTRAVENOUS
  Administered 2017-08-24: 75 ug via INTRAVENOUS

## 2017-08-24 MED ORDER — PHENYLEPHRINE 40 MCG/ML (10ML) SYRINGE FOR IV PUSH (FOR BLOOD PRESSURE SUPPORT)
PREFILLED_SYRINGE | INTRAVENOUS | Status: AC
  Filled 2017-08-24: qty 20

## 2017-08-24 MED ORDER — MORPHINE SULFATE (PF) 4 MG/ML IV SOLN
2.0000 mg | INTRAVENOUS | Status: DC | PRN
Start: 2017-08-24 — End: 2017-08-26
  Administered 2017-08-24 – 2017-08-26 (×11): 2 mg via INTRAVENOUS
  Filled 2017-08-24 (×10): qty 1

## 2017-08-24 MED ORDER — PHENYLEPHRINE 40 MCG/ML (10ML) SYRINGE FOR IV PUSH (FOR BLOOD PRESSURE SUPPORT)
PREFILLED_SYRINGE | INTRAVENOUS | Status: DC | PRN
  Administered 2017-08-24: 80 ug via INTRAVENOUS
  Administered 2017-08-24: 200 ug via INTRAVENOUS
  Administered 2017-08-24: 80 ug via INTRAVENOUS

## 2017-08-24 SURGICAL SUPPLY — 43 items
APPLIER CLIP 5 13 M/L LIGAMAX5 (MISCELLANEOUS) ×4
APR CLP MED LRG 5 ANG JAW (MISCELLANEOUS) ×1
BAG SPEC RTRVL 10 TROC 200 (ENDOMECHANICALS) ×1
BANDAGE ADH SHEER 1  50/CT (GAUZE/BANDAGES/DRESSINGS) ×12 IMPLANT
BENZOIN TINCTURE PRP APPL 2/3 (GAUZE/BANDAGES/DRESSINGS) ×3 IMPLANT
CANISTER SUCT 3000ML PPV (MISCELLANEOUS) ×4 IMPLANT
CHLORAPREP W/TINT 26ML (MISCELLANEOUS) ×4 IMPLANT
CLIP APPLIE 5 13 M/L LIGAMAX5 (MISCELLANEOUS) ×2 IMPLANT
CLOSURE WOUND 1/2 X4 (GAUZE/BANDAGES/DRESSINGS) ×1
COVER SURGICAL LIGHT HANDLE (MISCELLANEOUS) ×4 IMPLANT
DRSG TEGADERM 4X4.75 (GAUZE/BANDAGES/DRESSINGS) ×4 IMPLANT
ELECT REM PT RETURN 9FT ADLT (ELECTROSURGICAL) ×4
ELECTRODE REM PT RTRN 9FT ADLT (ELECTROSURGICAL) ×2 IMPLANT
ENDOLOOP SUT PDS II  0 18 (SUTURE) ×4
ENDOLOOP SUT PDS II 0 18 (SUTURE) ×2 IMPLANT
GAUZE SPONGE 2X2 8PLY STRL LF (GAUZE/BANDAGES/DRESSINGS) ×1 IMPLANT
GLOVE BIOGEL M STRL SZ7.5 (GLOVE) ×4 IMPLANT
GLOVE BIOGEL PI IND STRL 8 (GLOVE) ×4 IMPLANT
GLOVE BIOGEL PI INDICATOR 8 (GLOVE) ×4
GOWN STRL REUS W/ TWL LRG LVL3 (GOWN DISPOSABLE) ×5 IMPLANT
GOWN STRL REUS W/TWL 2XL LVL3 (GOWN DISPOSABLE) ×4 IMPLANT
GOWN STRL REUS W/TWL LRG LVL3 (GOWN DISPOSABLE) ×6
GRASPER SUT TROCAR 14GX15 (MISCELLANEOUS) ×3 IMPLANT
HEMOSTAT SNOW SURGICEL 2X4 (HEMOSTASIS) ×3 IMPLANT
KIT BASIN OR (CUSTOM PROCEDURE TRAY) ×4 IMPLANT
KIT TURNOVER KIT B (KITS) ×4 IMPLANT
NS IRRIG 1000ML POUR BTL (IV SOLUTION) ×4 IMPLANT
PAD ARMBOARD 7.5X6 YLW CONV (MISCELLANEOUS) ×4 IMPLANT
POUCH RETRIEVAL ECOSAC 10 (ENDOMECHANICALS) ×2 IMPLANT
POUCH RETRIEVAL ECOSAC 10MM (ENDOMECHANICALS) ×2
SCISSORS LAP 5X35 DISP (ENDOMECHANICALS) ×4 IMPLANT
SET IRRIG TUBING LAPAROSCOPIC (IRRIGATION / IRRIGATOR) ×4 IMPLANT
SLEEVE ENDOPATH XCEL 5M (ENDOMECHANICALS) ×8 IMPLANT
SPECIMEN JAR SMALL (MISCELLANEOUS) ×4 IMPLANT
SPONGE GAUZE 2X2 STER 10/PKG (GAUZE/BANDAGES/DRESSINGS) ×2
STRIP CLOSURE SKIN 1/2X4 (GAUZE/BANDAGES/DRESSINGS) ×2 IMPLANT
SUT VICRYL 0 UR6 27IN ABS (SUTURE) ×3 IMPLANT
TOWEL OR 17X24 6PK STRL BLUE (TOWEL DISPOSABLE) ×4 IMPLANT
TRAY LAPAROSCOPIC MC (CUSTOM PROCEDURE TRAY) ×4 IMPLANT
TROCAR XCEL BLUNT TIP 100MML (ENDOMECHANICALS) ×4 IMPLANT
TROCAR XCEL NON-BLD 5MMX100MML (ENDOMECHANICALS) ×4 IMPLANT
TUBING INSUFFLATION (TUBING) ×4 IMPLANT
WATER STERILE IRR 1000ML POUR (IV SOLUTION) ×4 IMPLANT

## 2017-08-24 NOTE — Op Note (Signed)
Pam Fuller [id number] 1932/07/16 08/26/2017  Laparoscopic Cholecystectomy  Procedure Note  Indications: This patient presents with symptomatic gallbladder disease and will undergo laparoscopic cholecystectomy.  Pre-operative Diagnosis: Calculus of gallbladder with acute cholecystitis, without mention of obstruction  Post-operative Diagnosis: severe calculous cholecystitis  Surgeon: Pam Pickerel  MD FACS  Assistants: none  Anesthesia: General endotracheal anesthesia  Procedure Details  The patient was seen again in the Holding Room. The risks, benefits, complications, treatment options, and expected outcomes were discussed with the patient. The possibilities of reaction to medication, pulmonary aspiration, perforation of viscus, bleeding, recurrent infection, finding a normal gallbladder, the need for additional procedures, failure to diagnose a condition, the possible need to convert to an open procedure, and creating a complication requiring transfusion or operation were discussed with the patient. The likelihood of improving the patient's symptoms with return to their baseline status is good.  The patient and/or family concurred with the proposed plan, giving informed consent. The site of surgery properly noted. The patient was taken to Operating Room, identified as Pam Fuller and the procedure verified as Laparoscopic Cholecystectomy with possible Intraoperative Cholangiogram. A Time Out was held and the above information confirmed. Antibiotic prophylaxis was administered.   Prior to the induction of general anesthesia, antibiotic prophylaxis was administered. General endotracheal anesthesia was then administered and tolerated well. After the induction, the abdomen was prepped with Chloraprep and draped in the sterile fashion. The patient was positioned in the supine position.  Local anesthetic agent was injected into the skin near the umbilicus and an incision made. We dissected  down to the abdominal fascia with blunt dissection.  The fascia was incised vertically and we entered the peritoneal cavity bluntly.  A pursestring suture of 0-Vicryl was placed around the fascial opening.  The Hasson cannula was inserted and secured with the stay suture.  Pneumoperitoneum was then created with CO2 and tolerated well without any adverse changes in the patient's vital signs. An 5-mm port was placed in the subxiphoid position.  Two 5-mm ports were placed in the right upper quadrant. All skin incisions were infiltrated with a local anesthetic agent before making the incision and placing the trocars.  There were some omental adhesions in and around the [facility] trocar to the abdominal wall but no evidence of bowel adhesions to the abdominal wall  We positioned the patient in reverse Trendelenburg, tilted slightly to the patient's left.  The omentum was adhered and stuck to the gallbladder.  The omentum was peeled back revealing a very distended inflamed thickened gallbladder.  Before I could retract the gallbladder it was aspirated in order to facilitate retraction.  The gallbladder was identified, the fundus grasped and retracted cephalad. Adhesions were lysed bluntly and with the electrocautery where indicated, taking care not to injure any adjacent organs or viscus. The infundibulum was grasped and retracted laterally, exposing the peritoneum overlying the triangle of Calot. This was then divided and exposed in a blunt fashion.  The inflammatory rind was peeled away with a combination of blunt dissection with the suction irrigator catheter as well as with the [employer]  A critical view of the cystic duct and cystic artery was obtained.  The cystic duct was clearly identified and bluntly dissected circumferentially.  The cystic duct appeared foreshortened and inflamed therefore I did not attempt to do a cholangiogram.  The cystic duct was ligated with a clip distally.    The cystic duct  was then ligated with 1 clip proximally and divided.  I then placed a PDS Endoloop around the cystic duct stump.  The cystic artery which had been identified & dissected free was ligated with clips and divided as well.   The gallbladder was dissected from the liver bed in retrograde fashion with the electrocautery.  The posterior wall of the gallbladder was quite adherent to the liver surface.  There was some spillage of bile and purulent fluid from the gallbladder.  There is no gallstone spillage.  The gallbladder was removed and placed in an Ecco sac.  The gallbladder and Ecco sac were then removed through the umbilical port site. The liver bed was irrigated and inspected. Hemostasis was achieved with the electrocautery. Copious irrigation was utilized and was repeatedly aspirated until clear.  I did elect to place a piece of surgical snow in the gallbladder fossa.  the pursestring suture was used to close the umbilical fascia.    We again inspected the right upper quadrant for hemostasis.  The umbilical closure was inspected and there was an air leak and nothing trapped within the closure.  I took down the surrounding omental adhesions to the umbilical area with Endo Shears with and without electrocautery.  There was no evidence of bleeding.  Then using a 0 Vicryl on a PMI suture passer I placed an additional fascial suture at the umbilicus using laparoscopic assistance.  There was no fascial defect nor air leak at this point.  Pneumoperitoneum was released as we removed the trocars.  4-0 Monocryl was used to close the skin.   Benzoin, steri-strips, and clean dressings were applied. The patient was then extubated and brought to the recovery room in stable condition. Instrument, sponge, and needle counts were correct at closure and at the conclusion of the case.   Findings: Cholecystitis with Cholelithiasis; +critical view; +SNoW  Estimated Blood Loss: less than 50 mL         Drains: none          Specimens: Gallbladder           Complications: None; patient tolerated the procedure well.         Disposition: PACU - hemodynamically stable.         Condition: stable  Leighton Ruff. Redmond Pulling, MD, FACS General, Bariatric, & Minimally Invasive Surgery [facility], [place]

## 2017-08-24 NOTE — Anesthesia Procedure Notes (Addendum)
Procedure Name: Intubation Date/Time: 08/22/2017 8:15 AM Performed by: Imagene Riches, CRNA Pre-anesthesia Checklist: Patient identified, Emergency Drugs available, Suction available and Patient being monitored Patient Re-evaluated:Patient Re-evaluated prior to induction Oxygen Delivery Method: Circle System Utilized Preoxygenation: Pre-oxygenation with 100% oxygen Induction Type: IV induction Ventilation: Mask ventilation without difficulty Laryngoscope Size: Glidescope and 3 Grade View: Grade I Tube type: Oral Tube size: 7.0 mm Number of attempts: 1 Airway Equipment and Method: Stylet and Video-laryngoscopy Placement Confirmation: ETT inserted through vocal cords under direct vision,  positive ETCO2 and breath sounds checked- equal and bilateral Secured at: 23 cm Tube secured with: Tape Dental Injury: Teeth and Oropharynx as per pre-operative assessment  Difficulty Due To: Difficulty was anticipated, Difficult Airway- due to reduced neck mobility, Difficult Airway- due to immobile epiglottis and Difficult Airway- due to anterior larynx Comments: First DL with Miller 2. Grade IV view. No attempt to pass ETT. Patient mask ventilated. Second DL with Glide 3, grade 1 view, ETT easily passed through cords under direct visualization.

## 2017-08-24 NOTE — Progress Notes (Signed)
[facility] Progress Note  Subjective: severe infected GB per the OR notes, GB removed by laparascopic techniques this am, pt in her room now post-op, in pain, did not ask her a lot of questions  Vitals:   08/26/2017 1115 09/02/2017 1116 09/01/2017 1130 08/23/2017 1143  BP:  125/66 123/62 130/67  Pulse: (!) 106 (!) 103 97 91  Resp: (!) 25 (!) 34 (!) 22 18  Temp:   97.7 F (36.5 C) 97.6 F (36.4 C)  TempSrc:    Oral  SpO2: 94% 94% 96% 96%  Weight:        Inpatient medications: . cloNIDine  0.1 mg Transdermal Q Wed  . docusate sodium  100 mg Oral BID  . [START ON 08/25/2017] heparin injection (subcutaneous)  5,000 Units Subcutaneous Q8H   . piperacillin-tazobactam (ZOSYN)  IV     acetaminophen **OR** acetaminophen, hydrALAZINE, morphine injection, ondansetron **OR** ondansetron (ZOFRAN) IV, traMADol  Exam: General: Well developed, elderly woman, in pain, not in distress Neck: JVD not elevated. Lungs: Clear bilaterally to auscultation Heart: RRR, 2/6 systolic murmur Abdomen: nondistended,  did not palpate her abd postop Lower extremities: No edema or ischemic changes, no open wounds. Neuro: Alert and oriented X 3. Moves all extremities spontaneously. Dialysis Access: L AVG + bruit (bandage still in place)    Dialysis: [facility]WF [facility] 3h 46min 400/A1.5   61.5kg  2/2.25 bath  P4  AVG LUE   Hep 4000 - Hectoral 54mcg IV q HD - Mircera 63mcg IV q 2 weeks (last 3/27)  Assessment/Plan: 1.  Acute cholecystitis: on zosyn IV, sp lap chole this am, significant GB inflammation noted in surgery.  2. ESRD:  Usual MWF schedule. Next HD Monday. K okay.   3. Volume: wt's are off, euvolemic to slightly dry on exam 4. Hypertension/volume: BP variable. No volume excess on exam. 5. Anemia: Hgb 10's > 8's now, follow. 6. Metabolic bone disease: Ca ok. Continue home binders once eating.   Kelly Splinter MD [facility] pager [phone]   09/02/2017, 12:42 PM   Recent Labs   Lab 08/23/17 0700 08/23/17 1438 09/05/2017 0546  NA 132* 136 133*  K 3.6 4.1 3.9  CL 90* 94* 91*  CO2 27 28 26   GLUCOSE 69 90 69  BUN 32* 11 21*  CREATININE 7.62* 3.63* 5.25*  CALCIUM 8.2* 8.2* 8.4*  PHOS 5.9* 3.0  --    Recent Labs  Lab 08/20/2017 1618 08/23/17 0700 08/23/17 1438 09/08/2017 0546  AST 23 91*  --  66*  ALT 12* 69*  --  51  ALKPHOS 66 127*  --  134*  BILITOT 1.0 1.6*  --  1.9*  PROT 7.3 6.1*  --  6.5  ALBUMIN 3.6 2.6* 2.6* 2.5*   Recent Labs  Lab 08/25/2017 1618 08/22/17 0636 08/23/17 0700 08/28/2017 0546  WBC 11.8* 14.9* 13.2* 9.1  NEUTROABS 10.9*  --   --   --   HGB 10.7* 10.5* 9.0* 8.3*  HCT 33.5* 32.8* 28.0* 25.0*  MCV 93.1 93.2 90.3 91.2  PLT 216 199 191 167   Iron/TIBC/Ferritin/ %Sat    Component Value Date/Time   IRON 84 04/15/2010 1030   TIBC 178 (L) 04/15/2010 1030   FERRITIN 784 (H) 04/15/2010 1030   IRONPCTSAT 47 04/15/2010 1030

## 2017-08-24 NOTE — Interval H&P Note (Signed)
History and Physical Interval Note:  09/16/2017 7:58 AM  Pam Fuller  has presented today for surgery, with the diagnosis of acute cholcystitis  The various methods of treatment have been discussed with the patient and family. After consideration of risks, benefits and other options for treatment, the patient has consented to  Procedure(s): LAPAROSCOPIC CHOLECYSTECTOMY WITH POSSIBLE INTRAOPERATIVE CHOLANGIOGRAM (N/A) as a surgical intervention .  The patient's history has been reviewed, patient examined, no change in status, stable for surgery.  I have reviewed the patient's chart and labs.  Questions were answered to the patient's satisfaction.    Pt seen & examined Chart reviewed Agree with cholecystitis dx Alert, nontoxic, elderly Soft, RUQ TTP, no peritonitis [facility]  I believe the patient's symptoms are consistent with gallbladder disease.  We discussed gallbladder disease.   I discussed laparoscopic cholecystectomy with possible IOC in detail.  The patient was shown diagrams detailing the procedure.  We discussed the risks and benefits of a laparoscopic cholecystectomy including, but not limited to bleeding, infection, injury to surrounding structures such as the intestine or liver, bile leak, retained gallstones, need to convert to an open procedure, prolonged diarrhea, blood clots such as  DVT, common bile duct injury, anesthesia risks, and possible need for additional procedures.  We discussed the typical post-operative recovery course. I explained that the likelihood of improvement of their symptoms is good. We discussed that she is at slightly higher risk for above complications given acute inflammation. We also discussed possibility of subtotal cholecystectomy as well as potential need for transfusion given her anemia. All questions asked and answered  [name]

## 2017-08-24 NOTE — Transfer of Care (Signed)
Immediate Anesthesia Transfer of Care Note  Patient: Pam Fuller  Procedure(s) Performed: LAPAROSCOPIC CHOLECYSTECTOMY (N/A Abdomen)  Patient Location: PACU  Anesthesia Type:General  Level of Consciousness: awake and alert   Airway & Oxygen Therapy: Patient Spontanous Breathing and Patient connected to nasal cannula oxygen  Post-op Assessment: Report given to RN and Post -op Vital signs reviewed and stable  Post vital signs: Reviewed and stable  Last Vitals:  Vitals Value Taken Time  BP 104/70 09/03/2017 10:16 AM  Temp    Pulse 106 08/25/2017 10:18 AM  Resp 30 09/06/2017 10:18 AM  SpO2 98 % 08/25/2017 10:18 AM  Vitals shown include unvalidated device data.  Last Pain:  Vitals:   08/27/2017 0559  TempSrc: Oral  PainSc:       Patients Stated Pain Goal: 2 (29/56/21 3086)  Complications: No apparent anesthesia complications and Patient re-intubated

## 2017-08-24 NOTE — Anesthesia Preprocedure Evaluation (Signed)
Anesthesia Evaluation  Patient identified by MRN, date of birth, ID band Patient awake    Reviewed: Allergy & Precautions, NPO status , Patient's Chart, lab work & pertinent test results  History of Anesthesia Complications Negative for: history of anesthetic complications  Airway Mallampati: IV  TM Distance: <3 FB Neck ROM: Limited  Mouth opening: Limited Mouth Opening  Dental  (+) Teeth Intact   Pulmonary neg pulmonary ROS,    breath sounds clear to auscultation       Cardiovascular hypertension, Pt. on medications (-) angina(-) Past MI and (-) CHF (-) dysrhythmias  Rhythm:Regular     Neuro/Psych negative neurological ROS     GI/Hepatic GERD  Medicated,  Endo/Other    Renal/GU ESRF and DialysisRenal diseaseHd fri without difficulty, lue access     Musculoskeletal  (+) Arthritis ,   Abdominal   Peds  Hematology  (+) anemia ,   Anesthesia Other Findings   Reproductive/Obstetrics                             Anesthesia Physical Anesthesia Plan  ASA: III  Anesthesia Plan: General   Post-op Pain Management:    Induction: Intravenous  PONV Risk Score and Plan: 3 and Ondansetron and Dexamethasone  Airway Management Planned: Video Laryngoscope Planned  Additional Equipment: None  Intra-op Plan:   Post-operative Plan: Extubation in OR  Informed Consent: I have reviewed the patients History and Physical, chart, labs and discussed the procedure including the risks, benefits and alternatives for the proposed anesthesia with the patient or authorized representative who has indicated his/her understanding and acceptance.   Dental advisory given  Plan Discussed with: CRNA and Surgeon  Anesthesia Plan Comments:         Anesthesia Quick Evaluation

## 2017-08-24 NOTE — Progress Notes (Signed)
Patient ID: RIN GORTON, female   DOB: Jul 20, 1932, 84 y.o.   MRN: [id number]                                                                PROGRESS NOTE                                                                                                                                                                                                             Patient Demographics:    Domonique Cothran, is a 84 y.o. female, DOB - [birth year], YWV:[id number]  Admit date - 09/02/2017   Admitting Physician Rise Patience, MD  Outpatient Primary MD for the patient is Patient, No Pcp Per  LOS - 2  Outpatient Specialists:     Lurline Del  Chief Complaint  Patient presents with  . Abdominal Pain       Brief Narrative   84 y.o. female with with history of ESRD on hemodialysis Monday Wednesday and Friday, hypertension, anemia and history of breast cancer presents to the ER because of abdominal pain.  Patient has been having right lower quadrant abdominal pain for last 2 days.  Has been having nausea vomiting.  Denies any diarrhea fever or chills.  Denies any chest pain or shortness of breath.  Had dialysis yesterday.  ED Course: In the ER CT abdomen pelvis shows features consistent with acute cholecystitis.  On-call general surgeon was consulted.  Patient was started on cholecystitis.  Patient has still not decided if patient was to have surgery for drain.  Okay with antibiotics.  At the time of my exam patient has not already received pain medication and states her abdominal pain is improved.     Subjective:    Malani Lees today has slight abdominal pain ,  No headache, No chest pain, No - No Nausea, No new weakness tingling or numbness, No Cough - SOB.    Assessment  & Plan :    Principal Problem:   Acute cholecystitis Active Problems:   ESRD on dialysis [facility])   Essential hypertension   GERD (gastroesophageal reflux disease)     1. Acute cholecystitis: patient will  have Lap chole in am. Has had HD today and stable. No complaint.  2. ESRD: HD today. Continue per Nephrology  3. HTN:  Controlled  4. Hx of Breast cancer: Stable. Follow up with oncology  5. Anemia of chronic dz: stable H/H. Watch for post-op anemia.      Code Status : FULL CODE  Family Communication  :  w family, son  Disposition Plan  : home  Barriers For Discharge :   Consults  :  Surgery,   Procedures  :   DVT Prophylaxis  :  Lovenox - Heparin - SCDs   Lab Results  Component Value Date   PLT 167 08/19/2017    Antibiotics  :  zosyn  Anti-infectives (From admission, onward)   Start     Dose/Rate Route Frequency Ordered Stop   08/22/17 1000  [MAR Hold]  piperacillin-tazobactam (ZOSYN) IVPB 3.375 g     (MAR Hold since Sat 08/23/2017 at 0747. Reason: Transfer to a Procedural area.)  Note to Pharmacy:  Zosyn 3.375 g IV q12h in ESRD on HD   3.375 g 12.5 mL/hr over 240 Minutes Intravenous Every 12 hours 08/22/17 0128     08/22/17 0045  piperacillin-tazobactam (ZOSYN) IVPB 3.375 g     3.375 g 12.5 mL/hr over 240 Minutes Intravenous  Once 08/22/17 0038 08/22/17 0145        Objective:   Vitals:   08/23/17 1459 08/23/17 1500 09/12/2017 0008 09/15/2017 0559  BP: (!) 100/52 (!) 100/52 (!) 113/53 (!) 110/53  Pulse: 89 89 88 80  Resp: 13 13 13 18   Temp: 99.8 F (37.7 C) 99.8 F (37.7 C) (!) 100.4 F (38 C) 99.3 F (37.4 C)  TempSrc: Oral Oral Oral Oral  SpO2: 96% 97% 100% 96%  Weight:    50.9 kg (112 lb 3.4 oz)    Wt Readings from Last 3 Encounters:  08/28/2017 50.9 kg (112 lb 3.4 oz)  07/04/17 64 kg (141 lb)  02/12/17 62.7 kg (138 lb 3.2 oz)     Intake/Output Summary (Last 24 hours) at 08/23/2017 0956 Last data filed at 09/13/2017 0859 Gross per 24 hour  Intake 500 ml  Output 794 ml  Net -294 ml     Physical Exam  Awake Alert, Oriented X 3, No new F.N deficits, Normal affect [place].AT,PERRAL Supple Neck,No JVD, No cervical lymphadenopathy appriciated.    Symmetrical Chest wall movement, Good air movement bilaterally, CTAB RRR,No Gallops,Rubs or new Murmurs, No Parasternal Heave +ve B.Sounds, Abd Soft, No tenderness, No organomegaly appriciated, No rebound - guarding or rigidity. No Cyanosis, Clubbing or edema, No new Rash or bruise      Data Review:    CBC Recent Labs  Lab 08/30/2017 1618 08/22/17 0636 08/23/17 0700 09/05/2017 0546  WBC 11.8* 14.9* 13.2* 9.1  HGB 10.7* 10.5* 9.0* 8.3*  HCT 33.5* 32.8* 28.0* 25.0*  PLT 216 199 191 167  MCV 93.1 93.2 90.3 91.2  MCH 29.7 29.8 29.0 30.3  MCHC 31.9 32.0 32.1 33.2  RDW 15.0 15.2 15.1 15.4  LYMPHSABS 0.6*  --   --   --   MONOABS 0.2  --   --   --   EOSABS 0.0  --   --   --   BASOSABS 0.0  --   --   --     Chemistries  Recent Labs  Lab 09/07/2017 1618 08/22/17 0636 08/23/17 0700 08/23/17 1438 09/14/2017 0546  NA 136 139 132* 136 133*  K 2.8* 3.7 3.6 4.1 3.9  CL 100* 93* 90* 94* 91*  CO2 27 31 27 28 26   GLUCOSE 155* 97 69 90 69  BUN 9 18 32* 11 21*  CREATININE 4.43* 5.55* 7.62* 3.63* 5.25*  CALCIUM 7.8* 8.8* 8.2* 8.2* 8.4*  AST 23  --  91*  --  66*  ALT 12*  --  69*  --  51  ALKPHOS 66  --  127*  --  134*  BILITOT 1.0  --  1.6*  --  1.9*   ------------------------------------------------------------------------------------------------------------------ No results for input(s): CHOL, HDL, LDLCALC, TRIG, CHOLHDL, LDLDIRECT in the last 72 hours.  Lab Results  Component Value Date   HGBA1C  04/01/2010    5.4 (NOTE)                                                                       According to the ADA Clinical Practice Recommendations for 2011, when HbA1c is used as a screening test:   >=6.5%   Diagnostic of Diabetes Mellitus           (if abnormal result  is confirmed)  5.7-6.4%   Increased risk of developing Diabetes Mellitus  References:Diagnosis and Classification of Diabetes Mellitus,Diabetes Care,2011,34(Suppl 1):S62-S69 and Standards of Medical Care in          Diabetes - 2011,Diabetes WUXL,2440,10  (Suppl 1):S11-S61.   ------------------------------------------------------------------------------------------------------------------ No results for input(s): TSH, T4TOTAL, T3FREE, THYROIDAB in the last 72 hours.  Invalid input(s): FREET3 ------------------------------------------------------------------------------------------------------------------ No results for input(s): VITAMINB12, FOLATE, FERRITIN, TIBC, IRON, RETICCTPCT in the last 72 hours.  Coagulation profile No results for input(s): INR, PROTIME in the last 168 hours.  No results for input(s): DDIMER in the last 72 hours.  Cardiac Enzymes No results for input(s): CKMB, TROPONINI, MYOGLOBIN in the last 168 hours.  Invalid input(s): CK ------------------------------------------------------------------------------------------------------------------ No results found for: BNP  Inpatient Medications  Scheduled Meds: . [MAR Hold] cloNIDine  0.1 mg Transdermal Q Wed   Continuous Infusions: . [MAR Hold] piperacillin-tazobactam (ZOSYN)  IV Stopped (09/16/2017 0442)   PRN Meds:.0.9 % irrigation (POUR BTL), bupivacaine-EPINEPHrine, hemostatic agents, [MAR Hold] hydrALAZINE, [MAR Hold]  morphine injection, [MAR Hold] ondansetron **OR** [MAR Hold] ondansetron (ZOFRAN) IV, sodium chloride irrigation  Micro Results Recent Results (from the past 240 hour(s))  Surgical pcr screen     Status: None   Collection Time: 08/22/17  4:03 AM  Result Value Ref Range Status   MRSA, PCR NEGATIVE NEGATIVE Final   Staphylococcus aureus NEGATIVE NEGATIVE Final    Comment: (NOTE) The Xpert SA Assay (FDA approved for NASAL specimens in patients 93 years of age and older), is one component of a comprehensive surveillance program. It is not intended to diagnose infection nor to guide or monitor treatment. Performed at [facility], [place] [address] [place], [place] [address]     Radiology  Reports Ct Abdomen Pelvis Wo Contrast  Result Date: 08/22/2017 CLINICAL DATA:  Right lower quadrant pain. EXAM: CT ABDOMEN AND PELVIS WITHOUT CONTRAST TECHNIQUE: Multidetector CT imaging of the abdomen and pelvis was performed following the standard protocol without IV contrast. COMPARISON:  Lumbar spine x-rays dated September 13, 2015. CT abdomen pelvis dated March 31, 2010. FINDINGS: Lower chest: No acute abnormality. Hepatobiliary: No focal liver abnormality. The gallbladder is distended and contains multiple small gallstones, one of which is seen in the gallbladder neck. Mild gallbladder wall thickening  with surrounding pericholecystic fluid. Mild dilatation of the common bile duct, measuring up to 10 mm. Pancreas: Mild atrophy. No ductal dilatation or surrounding inflammatory changes. Spleen: Normal in size without focal abnormality. Adrenals/Urinary Tract: Adrenal glands are unremarkable. Severe atrophy of the bilateral kidneys. No renal or ureteral calculi. No hydronephrosis. The bladder is unremarkable. Stomach/Bowel: Small hiatal hernia. The stomach is distended with oral contrast. No evidence of bowel wall thickening, distention, or surrounding inflammatory changes. Extensive colonic diverticulosis. Vascular/Lymphatic: Aortic atherosclerosis. No enlarged abdominal or pelvic lymph nodes. Reproductive: Status post hysterectomy. No adnexal masses. Other: No abdominal wall hernia or abnormality. Trace free fluid in the pelvis. No pneumoperitoneum. Musculoskeletal: Mild superior endplate compression deformities of L1 and L2 appear chronic but are new when compared to prior study. IMPRESSION: 1. Prominently distended gallbladder with a gallstone lodged in the gallbladder neck. Mild gallbladder wall thickening and surrounding pericholecystic fluid. Findings are concerning for acute cholecystitis. 2. Mild dilatation of the common bile duct, measuring up to 10 mm. No obstructing mass or stones seen. 3.  Aortic  atherosclerosis (ICD10-I70.0). Electronically Signed   By: Titus Dubin M.D.   On: 08/22/2017 00:03    Time Spent in minutes  30   Jani Gravel M.D on 09/12/2017 at 9:56 AM  Between 7am to 7pm - Pager - [phone]   After 7pm go to [email or web address] - password [facility]  [facility] -  Office  [phone]

## 2017-08-24 NOTE — Anesthesia Postprocedure Evaluation (Signed)
Anesthesia Post Note  Patient: Pam Fuller  Procedure(s) Performed: LAPAROSCOPIC CHOLECYSTECTOMY (N/A Abdomen)     Patient location during evaluation: PACU Anesthesia Type: General Level of consciousness: awake and alert Pain management: pain level controlled Vital Signs Assessment: post-procedure vital signs reviewed and stable Respiratory status: spontaneous breathing, nonlabored ventilation, respiratory function stable and patient connected to nasal cannula oxygen Cardiovascular status: blood pressure returned to baseline and stable Postop Assessment: no apparent nausea or vomiting Anesthetic complications: no    Last Vitals:  Vitals:   09/16/2017 1414 08/21/2017 1748  BP: 125/68 (!) 142/74  Pulse: 87 90  Resp: 18 20  Temp: 36.5 C 36.5 C  SpO2: 100% 96%    Last Pain:  Vitals:   08/19/2017 1748  TempSrc: Oral  PainSc:                  Kazimierz Springborn

## 2017-08-24 NOTE — Progress Notes (Signed)
Back from PACU via bed. Alert to self and place. Verbalized pain rate #5.

## 2017-08-25 ENCOUNTER — Encounter (HOSPITAL_COMMUNITY): Payer: Self-pay | Admitting: General Surgery

## 2017-08-25 LAB — CBC
HCT: 29.7 % — ABNORMAL LOW (ref 36.0–46.0)
HEMOGLOBIN: 9.9 g/dL — AB (ref 12.0–15.0)
MCH: 30.2 pg (ref 26.0–34.0)
MCHC: 33.3 g/dL (ref 30.0–36.0)
MCV: 90.5 fL (ref 78.0–100.0)
PLATELETS: 287 10*3/uL (ref 150–400)
RBC: 3.28 MIL/uL — AB (ref 3.87–5.11)
RDW: 14.9 % (ref 11.5–15.5)
WBC: 11.1 10*3/uL — AB (ref 4.0–10.5)

## 2017-08-25 LAB — GLUCOSE, CAPILLARY
Glucose-Capillary: 95 mg/dL (ref 65–99)
Glucose-Capillary: 124 mg/dL — ABNORMAL HIGH (ref 65–99)

## 2017-08-25 LAB — COMPREHENSIVE METABOLIC PANEL
ALT: 57 U/L — AB (ref 14–54)
AST: 60 U/L — AB (ref 15–41)
Albumin: 2.6 g/dL — ABNORMAL LOW (ref 3.5–5.0)
Alkaline Phosphatase: 135 U/L — ABNORMAL HIGH (ref 38–126)
Anion gap: 21 — ABNORMAL HIGH (ref 5–15)
BUN: 44 mg/dL — AB (ref 6–20)
CHLORIDE: 92 mmol/L — AB (ref 101–111)
CO2: 22 mmol/L (ref 22–32)
CREATININE: 6.78 mg/dL — AB (ref 0.44–1.00)
Calcium: 9.2 mg/dL (ref 8.9–10.3)
GFR calc Af Amer: 6 mL/min — ABNORMAL LOW (ref >60)
GFR, EST NON AFRICAN AMERICAN: 5 mL/min — AB (ref >60)
Glucose, Bld: 96 mg/dL (ref 65–99)
Potassium: 5 mmol/L (ref 3.5–5.1)
Sodium: 135 mmol/L (ref 135–145)
Total Bilirubin: 2 mg/dL — ABNORMAL HIGH (ref 0.3–1.2)
Total Protein: 7 g/dL (ref 6.5–8.1)

## 2017-08-25 MED ORDER — HALOPERIDOL LACTATE 5 MG/ML IJ SOLN: 2.0000 mg | Freq: Once | INTRAMUSCULAR | Status: DC

## 2017-08-25 MED ORDER — HYDROMORPHONE HCL 1 MG/ML IJ SOLN
1.0000 mg | Freq: Four times a day (QID) | INTRAMUSCULAR | Status: DC | PRN
  Administered 2017-08-25: 1 mg via INTRAVENOUS
  Filled 2017-08-25 (×2): qty 1

## 2017-08-25 MED ORDER — PROMETHAZINE HCL 25 MG/ML IJ SOLN
6.2500 mg | Freq: Four times a day (QID) | INTRAMUSCULAR | Status: DC | PRN
  Administered 2017-08-25 – 2017-08-27 (×3): 6.25 mg via INTRAVENOUS
  Filled 2017-08-25 (×3): qty 1

## 2017-08-25 MED ORDER — POLYETHYLENE GLYCOL 3350 17 G PO PACK
17.0000 g | PACK | Freq: Every day | ORAL | Status: DC | PRN
  Filled 2017-08-25: qty 1

## 2017-08-25 NOTE — Progress Notes (Addendum)
Patient ID: Pam Fuller, female   DOB: 1932/11/08, 84 y.o.   MRN: [id number]                                                                PROGRESS NOTE                                                                                                                                                                                                             Patient Demographics:    Pam Fuller, is a 83 y.o. female, DOB - 10/31/1932, ENI:[id number]  Admit date - 09/16/2017   Admitting Physician Rise Patience, MD  Outpatient Primary MD for the patient is Patient, No Pcp Per  LOS - 3  Outpatient Specialists:     Chief Complaint  Patient presents with  . Abdominal Pain       Brief Narrative   84 y.o.femalewithwith history of ESRD on hemodialysis Monday Wednesday and Friday, hypertension, anemia and history of breast cancer presents to the ER because of abdominal pain. Patient has been having right lower quadrant abdominal pain for last 2 days. Has been having nausea vomiting. Denies any diarrhea fever or chills. Denies any chest pain or shortness of breath. Had dialysis yesterday.  ED Course:In the ER CT abdomen pelvis shows features consistent with acute cholecystitis. On-call general surgeon was consulted. Patient was started on cholecystitis. Patient has still not decided if patient was to have surgery for drain. Okay with antibiotics. At the time of my exam patient has not already received pain medication and states her abdominal pain is improved.      Subjective:    Pam Fuller today has slight abdominal pain but better than yesterday. Afebrile. Denies n/v, diarrhea, constipation, brpbr.    No headache, No chest pain, No new weakness tingling or numbness, No Cough - SOB.    Assessment  & Plan :    Principal Problem:   Acute cholecystitis Active Problems:   ESRD on dialysis [facility])   Essential hypertension   GERD (gastroesophageal reflux  disease)    1. Acute cholecystitis:  Lap chole 4/6   Start miralax 17gm po qday prn constipation Pain controlled w morphine Appreciate surgery input  2. ESRD: HD (M, W, F)   Continue per Nephrology cmp in am  3. HTN: Controlled  4. Hx of Breast cancer: Stable. Follow up with oncology  5. Anemia of chronic dz: stable H/H. Watch for post-op anemia. Cbc in am     Code Status : FULL CODE  Family Communication  :  w family, son  Disposition Plan  : home  Barriers For Discharge :   Consults  :  Surgery, nephrology  Procedures  :   DVT Prophylaxis  :  - Heparin - SCDs      Lab Results  Component Value Date   PLT 287 08/25/2017    Antibiotics  :  Zosyn 4/4=> 4/6  Anti-infectives (From admission, onward)   Start     Dose/Rate Route Frequency Ordered Stop   08/19/2017 2200  piperacillin-tazobactam (ZOSYN) IVPB 3.375 g    Note to Pharmacy:  Zosyn 3.375 g IV q12h in ESRD on HD   3.375 g 12.5 mL/hr over 240 Minutes Intravenous Every 12 hours 08/23/2017 1155 08/25/17 2159   08/22/17 1000  piperacillin-tazobactam (ZOSYN) IVPB 3.375 g  Status:  Discontinued    Note to Pharmacy:  Zosyn 3.375 g IV q12h in ESRD on HD   3.375 g 12.5 mL/hr over 240 Minutes Intravenous Every 12 hours 08/22/17 0128 09/17/2017 1155   08/22/17 0045  piperacillin-tazobactam (ZOSYN) IVPB 3.375 g     3.375 g 12.5 mL/hr over 240 Minutes Intravenous  Once 08/22/17 0038 08/22/17 0145        Objective:   Vitals:   08/30/2017 1748 08/27/2017 2100 08/25/17 0156 08/25/17 0551  BP: (!) 142/74 (!) 144/73 (!) 143/80 139/72  Pulse: 90 99 (!) 102 (!) 105  Resp: 20 20 20 20   Temp: 97.7 F (36.5 C) 97.8 F (36.6 C) 98.2 F (36.8 C) 98 F (36.7 C)  TempSrc: Oral Oral Oral Oral  SpO2: 96% 100% 95% 96%  Weight:        Wt Readings from Last 3 Encounters:  09/13/2017 50.9 kg (112 lb 3.4 oz)  07/04/17 64 kg (141 lb)  02/12/17 62.7 kg (138 lb 3.2 oz)     Intake/Output Summary (Last 24 hours)  at 08/25/2017 0801 Last data filed at 09/04/2017 1749 Gross per 24 hour  Intake 900 ml  Output 50 ml  Net 850 ml     Physical Exam  Awake Alert, Oriented X 3, No new F.N deficits, Normal affect [place].AT,PERRAL Supple Neck,No JVD, No cervical lymphadenopathy appriciated.  Symmetrical Chest wall movement, Good air movement bilaterally, CTAB RRR,No Gallops,Rubs or new Murmurs, No Parasternal Heave +ve B.Sounds, Abd Soft, No tenderness, No organomegaly appriciated, No rebound - guarding or rigidity. No Cyanosis, Clubbing or edema, No new Rash or bruise      Data Review:    CBC Recent Labs  Lab 08/27/2017 1618 08/22/17 0636 08/23/17 0700 09/05/2017 0546 08/25/17 0720  WBC 11.8* 14.9* 13.2* 9.1 11.1*  HGB 10.7* 10.5* 9.0* 8.3* 9.9*  HCT 33.5* 32.8* 28.0* 25.0* 29.7*  PLT 216 199 191 167 287  MCV 93.1 93.2 90.3 91.2 90.5  MCH 29.7 29.8 29.0 30.3 30.2  MCHC 31.9 32.0 32.1 33.2 33.3  RDW 15.0 15.2 15.1 15.4 14.9  LYMPHSABS 0.6*  --   --   --   --   MONOABS 0.2  --   --   --   --   EOSABS 0.0  --   --   --   --   BASOSABS 0.0  --   --   --   --  Chemistries  Recent Labs  Lab 09/05/2017 1618 08/22/17 0636 08/23/17 0700 08/23/17 1438 09/03/2017 0546  NA 136 139 132* 136 133*  K 2.8* 3.7 3.6 4.1 3.9  CL 100* 93* 90* 94* 91*  CO2 27 31 27 28 26   GLUCOSE 155* 97 69 90 69  BUN 9 18 32* 11 21*  CREATININE 4.43* 5.55* 7.62* 3.63* 5.25*  CALCIUM 7.8* 8.8* 8.2* 8.2* 8.4*  AST 23  --  91*  --  66*  ALT 12*  --  69*  --  51  ALKPHOS 66  --  127*  --  134*  BILITOT 1.0  --  1.6*  --  1.9*   ------------------------------------------------------------------------------------------------------------------ No results for input(s): CHOL, HDL, LDLCALC, TRIG, CHOLHDL, LDLDIRECT in the last 72 hours.  Lab Results  Component Value Date   HGBA1C  04/01/2010    5.4 (NOTE)                                                                       According to the ADA Clinical Practice  Recommendations for 2011, when HbA1c is used as a screening test:   >=6.5%   Diagnostic of Diabetes Mellitus           (if abnormal result  is confirmed)  5.7-6.4%   Increased risk of developing Diabetes Mellitus  References:Diagnosis and Classification of Diabetes Mellitus,Diabetes Care,2011,34(Suppl 1):S62-S69 and Standards of Medical Care in         Diabetes - 2011,Diabetes WEXH,3716,96  (Suppl 1):S11-S61.   ------------------------------------------------------------------------------------------------------------------ No results for input(s): TSH, T4TOTAL, T3FREE, THYROIDAB in the last 72 hours.  Invalid input(s): FREET3 ------------------------------------------------------------------------------------------------------------------ No results for input(s): VITAMINB12, FOLATE, FERRITIN, TIBC, IRON, RETICCTPCT in the last 72 hours.  Coagulation profile No results for input(s): INR, PROTIME in the last 168 hours.  No results for input(s): DDIMER in the last 72 hours.  Cardiac Enzymes No results for input(s): CKMB, TROPONINI, MYOGLOBIN in the last 168 hours.  Invalid input(s): CK ------------------------------------------------------------------------------------------------------------------ No results found for: BNP  Inpatient Medications  Scheduled Meds: . cloNIDine  0.1 mg Transdermal Q Wed  . docusate sodium  100 mg Oral BID  . heparin injection (subcutaneous)  5,000 Units Subcutaneous Q8H   Continuous Infusions: . piperacillin-tazobactam (ZOSYN)  IV 3.375 g (08/25/17 0213)   PRN Meds:.acetaminophen **OR** acetaminophen, hydrALAZINE, morphine injection, ondansetron **OR** ondansetron (ZOFRAN) IV, traMADol  Micro Results Recent Results (from the past 240 hour(s))  Surgical pcr screen     Status: None   Collection Time: 08/22/17  4:03 AM  Result Value Ref Range Status   MRSA, PCR NEGATIVE NEGATIVE Final   Staphylococcus aureus NEGATIVE NEGATIVE Final    Comment:  (NOTE) The Xpert SA Assay (FDA approved for NASAL specimens in patients 82 years of age and older), is one component of a comprehensive surveillance program. It is not intended to diagnose infection nor to guide or monitor treatment. Performed at [facility], [place] [address], [place], [place] [address]     Radiology Reports Ct Abdomen Pelvis Wo Contrast  Result Date: 08/22/2017 CLINICAL DATA:  Right lower quadrant pain. EXAM: CT ABDOMEN AND PELVIS WITHOUT CONTRAST TECHNIQUE: Multidetector CT imaging of the abdomen and pelvis was performed following the standard protocol  without IV contrast. COMPARISON:  Lumbar spine x-rays dated September 13, 2015. CT abdomen pelvis dated March 31, 2010. FINDINGS: Lower chest: No acute abnormality. Hepatobiliary: No focal liver abnormality. The gallbladder is distended and contains multiple small gallstones, one of which is seen in the gallbladder neck. Mild gallbladder wall thickening with surrounding pericholecystic fluid. Mild dilatation of the common bile duct, measuring up to 10 mm. Pancreas: Mild atrophy. No ductal dilatation or surrounding inflammatory changes. Spleen: Normal in size without focal abnormality. Adrenals/Urinary Tract: Adrenal glands are unremarkable. Severe atrophy of the bilateral kidneys. No renal or ureteral calculi. No hydronephrosis. The bladder is unremarkable. Stomach/Bowel: Small hiatal hernia. The stomach is distended with oral contrast. No evidence of bowel wall thickening, distention, or surrounding inflammatory changes. Extensive colonic diverticulosis. Vascular/Lymphatic: Aortic atherosclerosis. No enlarged abdominal or pelvic lymph nodes. Reproductive: Status post hysterectomy. No adnexal masses. Other: No abdominal wall hernia or abnormality. Trace free fluid in the pelvis. No pneumoperitoneum. Musculoskeletal: Mild superior endplate compression deformities of L1 and L2 appear chronic but are new when compared to prior study.  IMPRESSION: 1. Prominently distended gallbladder with a gallstone lodged in the gallbladder neck. Mild gallbladder wall thickening and surrounding pericholecystic fluid. Findings are concerning for acute cholecystitis. 2. Mild dilatation of the common bile duct, measuring up to 10 mm. No obstructing mass or stones seen. 3.  Aortic atherosclerosis (ICD10-I70.0). Electronically Signed   By: Titus Dubin M.D.   On: 08/22/2017 00:03    Time Spent in minutes  30   Jani Gravel M.D on 08/25/2017 at 8:01 AM  Between 7am to 7pm - Pager - [phone]  After 7pm go to [email or web address] - password [facility]  [facility] -  Office  [phone]

## 2017-08-25 NOTE — Progress Notes (Signed)
1 Day Post-Op  Subjective: Alert.  Feels better.  Very deconditioned. BP 117/68.  Slightly tachycardic.  SPO2 96% on room air Starting to drink clear liquids, low volumes.  No nausea or vomiting  Lab work reveals total bilirubin 2.0.  AST and ALT mildly elevated.  Potassium 5.0.  BUN 44.  Hemoglobin 9.9, stable.  WBC 11,000.  Objective: Vital signs in last 24 hours: Temp:  [97.6 F (36.4 C)-98.3 F (36.8 C)] 98.3 F (36.8 C) (04/07 0915) Pulse Rate:  [87-107] 107 (04/07 0915) Resp:  [16-34] 18 (04/07 0915) BP: (104-144)/(58-80) 117/68 (04/07 0915) SpO2:  [92 %-100 %] 96 % (04/07 0915) Last BM Date: (PTA)  Intake/Output from previous day: 04/06 0701 - 04/07 0700 In: 900 [P.O.:100; I.V.:800] Out: 50 [Blood:50] Intake/Output this shift: Total I/O In: 80 [P.O.:80] Out: 0   General appearance: Alert.  Oriented.  No distress.  Deconditioned. Resp: clear to auscultation bilaterally GI: Soft.  Nondistended.  Trocar sites look good.  Minimal postop tenderness.  Lab Results:  Results for orders placed or performed during the hospital encounter of 09/08/2017 (from the past 24 hour(s))  Glucose, capillary     Status: Abnormal   Collection Time: 09/04/2017 11:49 PM  Result Value Ref Range   Glucose-Capillary 100 (H) 65 - 99 mg/dL  Glucose, capillary     Status: None   Collection Time: 08/25/17  5:44 AM  Result Value Ref Range   Glucose-Capillary 95 65 - 99 mg/dL  CBC     Status: Abnormal   Collection Time: 08/25/17  7:20 AM  Result Value Ref Range   WBC 11.1 (H) 4.0 - 10.5 K/uL   RBC 3.28 (L) 3.87 - 5.11 MIL/uL   Hemoglobin 9.9 (L) 12.0 - 15.0 g/dL   HCT 29.7 (L) 36.0 - 46.0 %   MCV 90.5 78.0 - 100.0 fL   MCH 30.2 26.0 - 34.0 pg   MCHC 33.3 30.0 - 36.0 g/dL   RDW 14.9 11.5 - 15.5 %   Platelets 287 150 - 400 K/uL  Comprehensive metabolic panel     Status: Abnormal   Collection Time: 08/25/17  7:20 AM  Result Value Ref Range   Sodium 135 135 - 145 mmol/L   Potassium 5.0 3.5 -  5.1 mmol/L   Chloride 92 (L) 101 - 111 mmol/L   CO2 22 22 - 32 mmol/L   Glucose, Bld 96 65 - 99 mg/dL   BUN 44 (H) 6 - 20 mg/dL   Creatinine, Ser 6.78 (H) 0.44 - 1.00 mg/dL   Calcium 9.2 8.9 - 10.3 mg/dL   Total Protein 7.0 6.5 - 8.1 g/dL   Albumin 2.6 (L) 3.5 - 5.0 g/dL   AST 60 (H) 15 - 41 U/L   ALT 57 (H) 14 - 54 U/L   Alkaline Phosphatase 135 (H) 38 - 126 U/L   Total Bilirubin 2.0 (H) 0.3 - 1.2 mg/dL   GFR calc non Af Amer 5 (L) >60 mL/min   GFR calc Af Amer 6 (L) >60 mL/min   Anion gap 21 (H) 5 - 15     Studies/Results: No results found.  . cloNIDine  0.1 mg Transdermal Q Wed  . docusate sodium  100 mg Oral BID  . heparin injection (subcutaneous)  5,000 Units Subcutaneous Q8H     Assessment/Plan: s/p Procedure(s): LAPAROSCOPIC CHOLECYSTECTOMY   POD #1.  Laparoscopic cholecystectomy for moderately severe acute cholecystitis Stable Advance diet and activities Check labs tomorrow Agree with MiraLAX for constipation  ESRD.--Hemodialysis Monday Wednesday Friday. Hypertension Chronic anemia History of breast cancer  @PROBHOSP @  LOS: 3 days    Pam Fuller 08/25/2017  . .prob

## 2017-08-25 NOTE — Progress Notes (Signed)
[facility] Progress Note  Subjective: had to get q 1hr pain meds yest, doing better today per RN  Vitals:   08/28/2017 2100 08/25/17 0156 08/25/17 0551 08/25/17 0915  BP: (!) 144/73 (!) 143/80 139/72 117/68  Pulse: 99 (!) 102 (!) 105 (!) 107  Resp: 20 20 20 18   Temp: 97.8 F (36.6 C) 98.2 F (36.8 C) 98 F (36.7 C) 98.3 F (36.8 C)  TempSrc: Oral Oral Oral Oral  SpO2: 100% 95% 96% 96%  Weight:        Inpatient medications: . cloNIDine  0.1 mg Transdermal Q Wed  . docusate sodium  100 mg Oral BID  . heparin injection (subcutaneous)  5,000 Units Subcutaneous Q8H   . piperacillin-tazobactam (ZOSYN)  IV 3.375 g (08/25/17 0929)   acetaminophen **OR** acetaminophen, hydrALAZINE, morphine injection, ondansetron **OR** ondansetron (ZOFRAN) IV, polyethylene glycol, traMADol  Exam: General: Well developed, elderly woman, in pain, not in distress Neck: JVD not elevated. Lungs: Clear bilaterally to auscultation Heart: RRR, 2/6 systolic murmur Abdomen: nondistended,  did not palpate her abd postop Lower extremities: No edema or ischemic changes, no open wounds. Neuro: Alert and oriented X 3. Moves all extremities spontaneously. Dialysis Access: L AVG + bruit    Dialysis: MWF [facility] 3h 3min 400/A1.5   61.5kg  2/2.25 bath  P4  AVG LUE   Hep 4000 - Hectoral 19mcg IV q HD - Mircera 48mcg IV q 2 weeks (last 3/27)  Assessment/Plan: 1.  Acute cholecystitis: sp lap chole 4/6, yesterday. Doing better today, postop pain improving.  Per gen surg/ primary 2. ESRD:  Usual MWF schedule. Next HD Monday. K = 5 3. Volume: wt's are off, euvolemic to slightly dry on exam 4. Hypertension/volume: BP variable. No volume excess on exam. 5. Anemia: Hgb 10's > 8's now, follow. 6. Metabolic bone disease: Ca ok. Continue home binders once eating.   Kelly Splinter MD [facility] pager [phone]   08/25/2017, 10:55 AM   Recent Labs  Lab 08/23/17 0700 08/23/17 1438  09/02/2017 0546 08/25/17 0720  NA 132* 136 133* 135  K 3.6 4.1 3.9 5.0  CL 90* 94* 91* 92*  CO2 27 28 26 22   GLUCOSE 69 90 69 96  BUN 32* 11 21* 44*  CREATININE 7.62* 3.63* 5.25* 6.78*  CALCIUM 8.2* 8.2* 8.4* 9.2  PHOS 5.9* 3.0  --   --    Recent Labs  Lab 08/23/17 0700 08/23/17 1438 08/22/2017 0546 08/25/17 0720  AST 91*  --  66* 60*  ALT 69*  --  51 57*  ALKPHOS 127*  --  134* 135*  BILITOT 1.6*  --  1.9* 2.0*  PROT 6.1*  --  6.5 7.0  ALBUMIN 2.6* 2.6* 2.5* 2.6*   Recent Labs  Lab 09/11/2017 1618  08/23/17 0700 09/17/2017 0546 08/25/17 0720  WBC 11.8*   < > 13.2* 9.1 11.1*  NEUTROABS 10.9*  --   --   --   --   HGB 10.7*   < > 9.0* 8.3* 9.9*  HCT 33.5*   < > 28.0* 25.0* 29.7*  MCV 93.1   < > 90.3 91.2 90.5  PLT 216   < > 191 167 287   < > = values in this interval not displayed.   Iron/TIBC/Ferritin/ %Sat    Component Value Date/Time   IRON 84 04/15/2010 1030   TIBC 178 (L) 04/15/2010 1030   FERRITIN 784 (H) 04/15/2010 1030   IRONPCTSAT 47 04/15/2010 1030

## 2017-08-26 ENCOUNTER — Inpatient Hospital Stay (HOSPITAL_COMMUNITY): Payer: Medicare Other

## 2017-08-26 LAB — GLUCOSE, CAPILLARY
GLUCOSE-CAPILLARY: 65 mg/dL (ref 65–99)
GLUCOSE-CAPILLARY: 114 mg/dL — AB (ref 65–99)
Glucose-Capillary: 81 mg/dL (ref 65–99)
Glucose-Capillary: 86 mg/dL (ref 65–99)

## 2017-08-26 LAB — CBC
HEMATOCRIT: 24.3 % — AB (ref 36.0–46.0)
HEMOGLOBIN: 8 g/dL — AB (ref 12.0–15.0)
MCH: 30 pg (ref 26.0–34.0)
MCHC: 32.9 g/dL (ref 30.0–36.0)
MCV: 91 fL (ref 78.0–100.0)
PLATELETS: 274 10*3/uL (ref 150–400)
RBC: 2.67 MIL/uL — AB (ref 3.87–5.11)
RDW: 15.4 % (ref 11.5–15.5)
WBC: 8.3 10*3/uL (ref 4.0–10.5)

## 2017-08-26 LAB — COMPREHENSIVE METABOLIC PANEL
ALK PHOS: 147 U/L — AB (ref 38–126)
ALT: 40 U/L (ref 14–54)
AST: 35 U/L (ref 15–41)
Albumin: 2.2 g/dL — ABNORMAL LOW (ref 3.5–5.0)
Anion gap: 19 — ABNORMAL HIGH (ref 5–15)
BUN: 62 mg/dL — AB (ref 6–20)
CALCIUM: 8.4 mg/dL — AB (ref 8.9–10.3)
CHLORIDE: 89 mmol/L — AB (ref 101–111)
CO2: 23 mmol/L (ref 22–32)
CREATININE: 8.19 mg/dL — AB (ref 0.44–1.00)
GFR calc Af Amer: 5 mL/min — ABNORMAL LOW (ref >60)
GFR, EST NON AFRICAN AMERICAN: 4 mL/min — AB (ref >60)
Glucose, Bld: 91 mg/dL (ref 65–99)
Potassium: 4.8 mmol/L (ref 3.5–5.1)
Sodium: 131 mmol/L — ABNORMAL LOW (ref 135–145)
Total Bilirubin: 1.9 mg/dL — ABNORMAL HIGH (ref 0.3–1.2)
Total Protein: 6.6 g/dL (ref 6.5–8.1)

## 2017-08-26 MED ORDER — HYDROMORPHONE HCL 1 MG/ML IJ SOLN
0.2500 mg | INTRAMUSCULAR | Status: DC | PRN
  Administered 2017-08-26 (×2): 0.25 mg via INTRAVENOUS
  Filled 2017-08-26 (×2): qty 1

## 2017-08-26 MED ORDER — MORPHINE SULFATE (PF) 4 MG/ML IV SOLN
INTRAVENOUS | Status: AC
  Filled 2017-08-26: qty 1

## 2017-08-26 MED ORDER — ACETAMINOPHEN 650 MG RE SUPP
650.0000 mg | Freq: Four times a day (QID) | RECTAL | Status: DC | PRN
  Filled 2017-08-26: qty 1

## 2017-08-26 MED ORDER — DOXERCALCIFEROL 4 MCG/2ML IV SOLN
2.0000 ug | INTRAVENOUS | Status: DC
  Administered 2017-08-26 – 2017-09-13 (×6): 2 ug via INTRAVENOUS
  Filled 2017-08-26 (×14): qty 2

## 2017-08-26 MED ORDER — DOXERCALCIFEROL 4 MCG/2ML IV SOLN
INTRAVENOUS | Status: AC
  Administered 2017-08-26: 2 ug via INTRAVENOUS
  Filled 2017-08-26: qty 2

## 2017-08-26 MED ORDER — ACETAMINOPHEN 10 MG/ML IV SOLN
1000.0000 mg | Freq: Four times a day (QID) | INTRAVENOUS | Status: AC
  Administered 2017-08-26 – 2017-08-27 (×4): 1000 mg via INTRAVENOUS
  Filled 2017-08-26 (×4): qty 100

## 2017-08-26 MED ORDER — HYDROMORPHONE HCL 1 MG/ML IJ SOLN
0.2500 mg | Freq: Four times a day (QID) | INTRAMUSCULAR | Status: DC | PRN
  Administered 2017-08-26 (×2): 0.25 mg via INTRAVENOUS
  Filled 2017-08-26 (×2): qty 1

## 2017-08-26 MED ORDER — HYDROMORPHONE HCL 1 MG/ML IJ SOLN
INTRAMUSCULAR | Status: AC
  Administered 2017-08-26: 0.25 mg via INTRAVENOUS
  Filled 2017-08-26: qty 0.5

## 2017-08-26 MED ORDER — DARBEPOETIN ALFA 100 MCG/0.5ML IJ SOSY
PREFILLED_SYRINGE | INTRAMUSCULAR | Status: AC
  Filled 2017-08-26: qty 0.5

## 2017-08-26 MED ORDER — TRAMADOL HCL 50 MG PO TABS
ORAL_TABLET | ORAL | Status: AC
  Filled 2017-08-26: qty 2

## 2017-08-26 MED ORDER — DARBEPOETIN ALFA 100 MCG/0.5ML IJ SOSY
100.0000 ug | PREFILLED_SYRINGE | Freq: Once | INTRAMUSCULAR | Status: AC
  Administered 2017-08-26: 100 ug via INTRAVENOUS

## 2017-08-26 MED ORDER — ACETAMINOPHEN 325 MG PO TABS
650.0000 mg | ORAL_TABLET | Freq: Four times a day (QID) | ORAL | Status: DC | PRN
  Administered 2017-08-27 – 2017-08-28 (×2): 650 mg via ORAL
  Filled 2017-08-26 (×2): qty 2

## 2017-08-26 MED ORDER — IOPAMIDOL (ISOVUE-300) INJECTION 61%
INTRAVENOUS | Status: AC
  Filled 2017-08-26: qty 30

## 2017-08-26 NOTE — Progress Notes (Signed)
Late entry for 1130 am today. Patient received full HD trmt.  She has moaned for most of trmt, despite medication x 2 for pain.  She has stared at the ceiling for most of trmt.  She is oriented to self, and place, but not date or time.  Familiar with patient from outpt dialysis center.  This is not her normal/baseline.  Dr. Moshe Cipro and Wilmer Floor, PA notified of above.

## 2017-08-26 NOTE — Progress Notes (Signed)
[facility] Progress Note  Subjective: pain still appears to be an issue- seen in HD- wide eyed- can tell me her name and that the pain med does help when she gets it   Vitals:   08/25/17 1908 08/25/17 2210 08/26/17 0600 08/26/17 0700  BP:  (!) 103/58 (!) 181/83 (!) (P) 94/51  Pulse:  (!) 101 96 (!) (P) 109  Resp:  16  (P) 12  Temp: 97.7 F (36.5 C) 98 F (36.7 C) 98.7 F (37.1 C) (P) 98.3 F (36.8 C)  TempSrc: Oral Oral Oral (P) Oral  SpO2:  100% 100% (P) 100%  Weight:        Inpatient medications: . cloNIDine  0.1 mg Transdermal Q Wed  . docusate sodium  100 mg Oral BID  . haloperidol lactate  2 mg Intravenous Once  . heparin injection (subcutaneous)  5,000 Units Subcutaneous Q8H    acetaminophen **OR** acetaminophen, hydrALAZINE, HYDROmorphone (DILAUDID) injection, morphine injection, ondansetron **OR** ondansetron (ZOFRAN) IV, polyethylene glycol, promethazine, traMADol  Exam: General: Well developed, elderly woman, in pain- wide eyed- does answer direct questions Neck: JVD not elevated. Lungs: Clear bilaterally to auscultation Heart: RRR, 2/6 systolic murmur Abdomen: nondistended,  did not palpate her abd postop Lower extremities: No edema or ischemic changes, no open wounds. Neuro: Alert and oriented X 3. Moves all extremities spontaneously. Dialysis Access: L AVG + bruit    Dialysis: MWF [facility] 3h 72min 400/A1.5   61.5kg  2/2.25 bath  P4  AVG LUE   Hep 4000 - Hectoral 42mcg IV q HD - Mircera 101mcg IV q 2 weeks (last 3/27)  Assessment/Plan: 1.  Acute cholecystitis: sp lap chole 4/6. postop pain improving but still present.  Per gen surg/ primary 2. ESRD:  Usual MWF schedule via AVG. 2 K bath today 3. Volume: wt's are off, euvolemic to slightly dry on exam- minimal UF today 4. Hypertension/volume: BP variable but mostly low. No volume excess on exam. 5. Anemia: Hgb 10's > 8's now 9.9,  Due for another dose of ESA today- will order  6. Metabolic  bone disease: Ca ok. Continue home binders once eating, last phos was 3. Cont OP vit D   Alois Colgan A  08/26/2017, 8:17 AM   Recent Labs  Lab 08/23/17 0700 08/23/17 1438 08/20/2017 0546 08/25/17 0720  NA 132* 136 133* 135  K 3.6 4.1 3.9 5.0  CL 90* 94* 91* 92*  CO2 27 28 26 22   GLUCOSE 69 90 69 96  BUN 32* 11 21* 44*  CREATININE 7.62* 3.63* 5.25* 6.78*  CALCIUM 8.2* 8.2* 8.4* 9.2  PHOS 5.9* 3.0  --   --    Recent Labs  Lab 08/23/17 0700 08/23/17 1438 09/05/2017 0546 08/25/17 0720  AST 91*  --  66* 60*  ALT 69*  --  51 57*  ALKPHOS 127*  --  134* 135*  BILITOT 1.6*  --  1.9* 2.0*  PROT 6.1*  --  6.5 7.0  ALBUMIN 2.6* 2.6* 2.5* 2.6*   Recent Labs  Lab 08/31/2017 1618  08/23/17 0700 09/06/2017 0546 08/25/17 0720  WBC 11.8*   < > 13.2* 9.1 11.1*  NEUTROABS 10.9*  --   --   --   --   HGB 10.7*   < > 9.0* 8.3* 9.9*  HCT 33.5*   < > 28.0* 25.0* 29.7*  MCV 93.1   < > 90.3 91.2 90.5  PLT 216   < > 191 167 287   < > =  values in this interval not displayed.   Iron/TIBC/Ferritin/ %Sat    Component Value Date/Time   IRON 84 04/15/2010 1030   TIBC 178 (L) 04/15/2010 1030   FERRITIN 784 (H) 04/15/2010 1030   IRONPCTSAT 47 04/15/2010 1030

## 2017-08-26 NOTE — Procedures (Signed)
Patient was seen on dialysis and the procedure was supervised.  BFR 350  Via AVG BP is  94/51- likely due to pain meds.   Patient appears to be tolerating treatment well- in pain- low threshold to stop tx early   Pam Fuller A 08/26/2017

## 2017-08-26 NOTE — Progress Notes (Signed)
2 Days Post-Op  Subjective: Pt moaning with more pain today in HD.  Seems increased from yesterday.  She is eating some, no nausea  Objective: Vital signs in last 24 hours: Temp:  [97.7 F (36.5 C)-98.7 F (37.1 C)] 98.3 F (36.8 C) (04/08 0700) Pulse Rate:  [96-115] 109 (04/08 0830) Resp:  [12-17] 17 (04/08 0830) BP: (81-181)/(45-83) 81/45 (04/08 0830) SpO2:  [100 %] 100 % (04/08 0700) Weight:  [144 lb 2.9 oz (65.4 kg)] 144 lb 2.9 oz (65.4 kg) (04/08 0700) Last BM Date: 08/22/17  Intake/Output from previous day: 04/07 0701 - 04/08 0700 In: 200 [P.O.:200] Out: 0  Intake/Output this shift: No intake/output data recorded.  PE: Abd: soft, but does guard with palpation to abdomen, but not totally unexpected given incisions.  Incisions are c/d/i with gauze and tegaderm in place.  +BS  Lab Results:  Recent Labs    08/25/17 0720 08/26/17 0732  WBC 11.1* 8.3  HGB 9.9* 8.0*  HCT 29.7* 24.3*  PLT 287 274   BMET Recent Labs    08/25/17 0720 08/26/17 0732  NA 135 131*  K 5.0 4.8  CL 92* 89*  CO2 22 23  GLUCOSE 96 91  BUN 44* 62*  CREATININE 6.78* 8.19*  CALCIUM 9.2 8.4*   PT/INR No results for input(s): LABPROT, INR in the last 72 hours. CMP     Component Value Date/Time   NA 131 (L) 08/26/2017 0732   NA 142 02/12/2017 1400   K 4.8 08/26/2017 0732   K 3.9 02/12/2017 1400   CL 89 (L) 08/26/2017 0732   CO2 23 08/26/2017 0732   CO2 30 (H) 02/12/2017 1400   GLUCOSE 91 08/26/2017 0732   GLUCOSE 88 02/12/2017 1400   BUN 62 (H) 08/26/2017 0732   BUN 16.9 02/12/2017 1400   CREATININE 8.19 (H) 08/26/2017 0732   CREATININE 6.2 (HH) 02/12/2017 1400   CALCIUM 8.4 (L) 08/26/2017 0732   CALCIUM 9.2 02/12/2017 1400   PROT 6.6 08/26/2017 0732   PROT 6.8 02/12/2017 1400   ALBUMIN 2.2 (L) 08/26/2017 0732   ALBUMIN 3.3 (L) 02/12/2017 1400   AST 35 08/26/2017 0732   AST 13 02/12/2017 1400   ALT 40 08/26/2017 0732   ALT 7 02/12/2017 1400   ALKPHOS 147 (H)  08/26/2017 0732   ALKPHOS 75 02/12/2017 1400   BILITOT 1.9 (H) 08/26/2017 0732   BILITOT 0.68 02/12/2017 1400   GFRNONAA 4 (L) 08/26/2017 0732   GFRAA 5 (L) 08/26/2017 0732   Lipase     Component Value Date/Time   LIPASE 36 08/22/2017 1618       Studies/Results: No results found.  Anti-infectives: Anti-infectives (From admission, onward)   Start     Dose/Rate Route Frequency Ordered Stop   09/14/2017 2200  piperacillin-tazobactam (ZOSYN) IVPB 3.375 g    Note to Pharmacy:  Zosyn 3.375 g IV q12h in ESRD on HD   3.375 g 12.5 mL/hr over 240 Minutes Intravenous Every 12 hours 09/14/2017 1155 08/25/17 1335   08/22/17 1000  piperacillin-tazobactam (ZOSYN) IVPB 3.375 g  Status:  Discontinued    Note to Pharmacy:  Zosyn 3.375 g IV q12h in ESRD on HD   3.375 g 12.5 mL/hr over 240 Minutes Intravenous Every 12 hours 08/22/17 0128 09/07/2017 1155   08/22/17 0045  piperacillin-tazobactam (ZOSYN) IVPB 3.375 g     3.375 g 12.5 mL/hr over 240 Minutes Intravenous  Once 08/22/17 0038 08/22/17 0145       Assessment/Plan  HTN ESRD - HD MWF H/o breast cancer  Acute cholecystitis, POD 2, s/p lap chole -patient seems to have worsening pain today.  Unclear if this is worse than yesterday or not.  She is a bit confused on timing and the date.   -will add scheduled IV tylenol today to help with pain control.  If patient is still having increased pain tomorrow, then she may need further evaluation to rule out a complication. -cont diet -mobilize TID -check labs in am  ID -zosyn 4/4>>4/7 FEN -IVF, clear liquids, advance diet as tolerates VTE -SCDs, heparin Foley -none Follow up -[facility] clinic  Plan- patient with more pain it seems today.  Will add IV tylenol today for pain control.  If pain still increased tomorrow then will need to consider further evaluation to rule out a complication. Mobilize TID Check labs in am   LOS: 4 days    Henreitta Cea , [facility]  Surgery 08/26/2017, 10:49 AM Pager: [phone]

## 2017-08-26 NOTE — Progress Notes (Signed)
Patient ID: Pam Fuller, female   DOB: 1933/03/04, 84 y.o.   MRN: [id number]                                                                PROGRESS NOTE                                                                                                                                                                                                             Patient Demographics:    Pam Fuller, is a 84 y.o. female, DOB - 07-May-1933, MPN:[id number]  Admit date - 08/19/2017   Admitting Physician Rise Patience, MD  Outpatient Primary MD for the patient is Patient, No Pcp Per  LOS - 4  Outpatient Specialists:     Chief Complaint  Patient presents with  . Abdominal Pain       Brief Narrative   84 y.o.femalewithwith history of ESRD on hemodialysis Monday Wednesday and Friday, hypertension, anemia and history of breast cancer presents to the ER because of abdominal pain. Patient has been having right lower quadrant abdominal pain for last 2 days. Has been having nausea vomiting. Denies any diarrhea fever or chills. Denies any chest pain or shortness of breath. Had dialysis yesterday.  ED Course:In the ER CT abdomen pelvis shows features consistent with acute cholecystitis. On-call general surgeon was consulted. Patient was started on cholecystitis. Patient has still not decided if patient was to have surgery for drain. Okay with antibiotics. At the time of my exam patient has not already received pain medication and states her abdominal pain is improved.       Subjective:    Pam Fuller todayseemed somewhat confused per nursing staff.  Pt complains of lower abdominal pain, denies fever, chills, n/v, diarrhea, brbpr.     No headache, No chest pain, No new weakness tingling or numbness, No Cough - SOB.   Assessment  & Plan :    Principal Problem:   Acute cholecystitis Active Problems:   ESRD on dialysis [facility])   Essential hypertension   GERD  (gastroesophageal reflux disease)      1. Acute cholecystitis:  Lap chole 4/6   Start miralax 17gm po qday prn constipation 4/7 Pain uncontrolled Change from morhpine sulfate to dilaudid 1mg  iv q3h prn CT scan abd /  pelvis: => slight fluid in lower abd, most likely result of post-operative change Appreciate surgery input  2.  AMS ? Due to opioids, try to minimize narcotics CT brain 4/8 => negative    3. ESRD: HD (M, W, F)   Continue per Nephrology cmp in am  4. HTN: Controlled  5. Hx of Breast cancer: Stable. Follow up with oncology  6. Anemia of chronic dz: stable H/H. Watch for post-op anemia. Cbc in am      Code Status:FULL CODE  Family Communication :w family, son  Disposition Plan:home  Barriers For Discharge:  Consults :Surgery,nephrology  Procedures :   DVT Prophylaxis: - Heparin - SCDs      Lab Results  Component Value Date   PLT 274 08/26/2017    Antibiotics  :  Zosyn 4/4=> 4/6   Anti-infectives (From admission, onward)   Start     Dose/Rate Route Frequency Ordered Stop   08/19/2017 2200  piperacillin-tazobactam (ZOSYN) IVPB 3.375 g    Note to Pharmacy:  Zosyn 3.375 g IV q12h in ESRD on HD   3.375 g 12.5 mL/hr over 240 Minutes Intravenous Every 12 hours 08/20/2017 1155 08/25/17 1335   08/22/17 1000  piperacillin-tazobactam (ZOSYN) IVPB 3.375 g  Status:  Discontinued    Note to Pharmacy:  Zosyn 3.375 g IV q12h in ESRD on HD   3.375 g 12.5 mL/hr over 240 Minutes Intravenous Every 12 hours 08/22/17 0128 08/23/2017 1155   08/22/17 0045  piperacillin-tazobactam (ZOSYN) IVPB 3.375 g     3.375 g 12.5 mL/hr over 240 Minutes Intravenous  Once 08/22/17 0038 08/22/17 0145        Objective:   Vitals:   08/26/17 0714 08/26/17 0730 08/26/17 0800 08/26/17 0830  BP: (!) 88/52 (!) 87/60 (!) 82/50 (!) 81/45  Pulse: (!) 109 (!) 115 (!) 110 (!) 109  Resp: 12 12 12 17   Temp:      TempSrc:      SpO2:      Weight:          Wt Readings from Last 3 Encounters:  08/26/17 65.4 kg (144 lb 2.9 oz)  07/04/17 64 kg (141 lb)  02/12/17 62.7 kg (138 lb 3.2 oz)     Intake/Output Summary (Last 24 hours) at 08/26/2017 1140 Last data filed at 08/25/2017 1400 Gross per 24 hour  Intake 120 ml  Output -  Net 120 ml     Physical Exam  Awake Alert, Oriented X 3, No new F.N deficits, Normal affect [place].AT,PERRAL Supple Neck,No JVD, No cervical lymphadenopathy appriciated.  Symmetrical Chest wall movement, Good air movement bilaterally, CTAB RRR,No Gallops,Rubs or new Murmurs, No Parasternal Heave +ve B.Sounds, Abd Soft, No tenderness, No organomegaly appriciated, No rebound - guarding or rigidity. No Cyanosis, Clubbing or edema, No new Rash or bruise       Data Review:    CBC Recent Labs  Lab 09/03/2017 1618 08/22/17 0636 08/23/17 0700 08/30/2017 0546 08/25/17 0720 08/26/17 0732  WBC 11.8* 14.9* 13.2* 9.1 11.1* 8.3  HGB 10.7* 10.5* 9.0* 8.3* 9.9* 8.0*  HCT 33.5* 32.8* 28.0* 25.0* 29.7* 24.3*  PLT 216 199 191 167 287 274  MCV 93.1 93.2 90.3 91.2 90.5 91.0  MCH 29.7 29.8 29.0 30.3 30.2 30.0  MCHC 31.9 32.0 32.1 33.2 33.3 32.9  RDW 15.0 15.2 15.1 15.4 14.9 15.4  LYMPHSABS 0.6*  --   --   --   --   --   MONOABS 0.2  --   --   --   --   --  EOSABS 0.0  --   --   --   --   --   BASOSABS 0.0  --   --   --   --   --     Chemistries  Recent Labs  Lab 08/31/2017 1618  08/23/17 0700 08/23/17 1438 09/09/2017 0546 08/25/17 0720 08/26/17 0732  NA 136   < > 132* 136 133* 135 131*  K 2.8*   < > 3.6 4.1 3.9 5.0 4.8  CL 100*   < > 90* 94* 91* 92* 89*  CO2 27   < > 27 28 26 22 23   GLUCOSE 155*   < > 69 90 69 96 91  BUN 9   < > 32* 11 21* 44* 62*  CREATININE 4.43*   < > 7.62* 3.63* 5.25* 6.78* 8.19*  CALCIUM 7.8*   < > 8.2* 8.2* 8.4* 9.2 8.4*  AST 23  --  91*  --  66* 60* 35  ALT 12*  --  69*  --  51 57* 40  ALKPHOS 66  --  127*  --  134* 135* 147*  BILITOT 1.0  --  1.6*  --  1.9* 2.0* 1.9*   < > = values in  this interval not displayed.   ------------------------------------------------------------------------------------------------------------------ No results for input(s): CHOL, HDL, LDLCALC, TRIG, CHOLHDL, LDLDIRECT in the last 72 hours.  Lab Results  Component Value Date   HGBA1C  04/01/2010    5.4 (NOTE)                                                                       According to the ADA Clinical Practice Recommendations for 2011, when HbA1c is used as a screening test:   >=6.5%   Diagnostic of Diabetes Mellitus           (if abnormal result  is confirmed)  5.7-6.4%   Increased risk of developing Diabetes Mellitus  References:Diagnosis and Classification of Diabetes Mellitus,Diabetes Care,2011,34(Suppl 1):S62-S69 and Standards of Medical Care in         Diabetes - 2011,Diabetes BPZW,2585,27  (Suppl 1):S11-S61.   ------------------------------------------------------------------------------------------------------------------ No results for input(s): TSH, T4TOTAL, T3FREE, THYROIDAB in the last 72 hours.  Invalid input(s): FREET3 ------------------------------------------------------------------------------------------------------------------ No results for input(s): VITAMINB12, FOLATE, FERRITIN, TIBC, IRON, RETICCTPCT in the last 72 hours.  Coagulation profile No results for input(s): INR, PROTIME in the last 168 hours.  No results for input(s): DDIMER in the last 72 hours.  Cardiac Enzymes No results for input(s): CKMB, TROPONINI, MYOGLOBIN in the last 168 hours.  Invalid input(s): CK ------------------------------------------------------------------------------------------------------------------ No results found for: BNP  Inpatient Medications  Scheduled Meds: . cloNIDine  0.1 mg Transdermal Q Wed  . docusate sodium  100 mg Oral BID  . doxercalciferol  2 mcg Intravenous Q M,W,F-HD  . haloperidol lactate  2 mg Intravenous Once  . heparin injection (subcutaneous)  5,000  Units Subcutaneous Q8H   Continuous Infusions: PRN Meds:.[START ON 08/27/2017] acetaminophen **OR** [START ON 08/27/2017] acetaminophen, hydrALAZINE, HYDROmorphone (DILAUDID) injection, ondansetron **OR** ondansetron (ZOFRAN) IV, polyethylene glycol, promethazine, traMADol  Micro Results Recent Results (from the past 240 hour(s))  Surgical pcr screen     Status: None   Collection Time: 08/22/17  4:03 AM  Result Value  Ref Range Status   MRSA, PCR NEGATIVE NEGATIVE Final   Staphylococcus aureus NEGATIVE NEGATIVE Final    Comment: (NOTE) The Xpert SA Assay (FDA approved for NASAL specimens in patients 50 years of age and older), is one component of a comprehensive surveillance program. It is not intended to diagnose infection nor to guide or monitor treatment. Performed at [facility], [place] [address] [place], [place] [address]     Radiology Reports Ct Abdomen Pelvis Wo Contrast  Result Date: 08/22/2017 CLINICAL DATA:  Right lower quadrant pain. EXAM: CT ABDOMEN AND PELVIS WITHOUT CONTRAST TECHNIQUE: Multidetector CT imaging of the abdomen and pelvis was performed following the standard protocol without IV contrast. COMPARISON:  Lumbar spine x-rays dated September 13, 2015. CT abdomen pelvis dated March 31, 2010. FINDINGS: Lower chest: No acute abnormality. Hepatobiliary: No focal liver abnormality. The gallbladder is distended and contains multiple small gallstones, one of which is seen in the gallbladder neck. Mild gallbladder wall thickening with surrounding pericholecystic fluid. Mild dilatation of the common bile duct, measuring up to 10 mm. Pancreas: Mild atrophy. No ductal dilatation or surrounding inflammatory changes. Spleen: Normal in size without focal abnormality. Adrenals/Urinary Tract: Adrenal glands are unremarkable. Severe atrophy of the bilateral kidneys. No renal or ureteral calculi. No hydronephrosis. The bladder is unremarkable. Stomach/Bowel: Small hiatal hernia. The  stomach is distended with oral contrast. No evidence of bowel wall thickening, distention, or surrounding inflammatory changes. Extensive colonic diverticulosis. Vascular/Lymphatic: Aortic atherosclerosis. No enlarged abdominal or pelvic lymph nodes. Reproductive: Status post hysterectomy. No adnexal masses. Other: No abdominal wall hernia or abnormality. Trace free fluid in the pelvis. No pneumoperitoneum. Musculoskeletal: Mild superior endplate compression deformities of L1 and L2 appear chronic but are new when compared to prior study. IMPRESSION: 1. Prominently distended gallbladder with a gallstone lodged in the gallbladder neck. Mild gallbladder wall thickening and surrounding pericholecystic fluid. Findings are concerning for acute cholecystitis. 2. Mild dilatation of the common bile duct, measuring up to 10 mm. No obstructing mass or stones seen. 3.  Aortic atherosclerosis (ICD10-I70.0). Electronically Signed   By: Titus Dubin M.D.   On: 08/22/2017 00:03    Time Spent in minutes  30   Jani Gravel M.D on 08/26/2017 at 11:40 AM  Between 7am to 7pm - Pager - [phone]  After 7pm go to [email or web address] - password [facility]  [facility] -  Office  [phone]

## 2017-08-26 NOTE — Progress Notes (Signed)
Pt to dialysis.

## 2017-08-26 NOTE — Progress Notes (Signed)
Pt continually moaning and blankly staring into space.  Can tell me her name and that she is in the hospital.  C/o pain in her abdomen and shoulders and paid meds given. Asked mobility tech to help mobilize patient.  Staff who cared for her last week state that "this is not her norm and she does not look good."  VS stable.  MD notified.  Will continue to monitor.  Eliezer [place]

## 2017-08-26 NOTE — Progress Notes (Signed)
Hypoglycemic Event  CBG: 65 @ 1213 after HD  Treatment: 15 GM carbohydrate snack  Symptoms: None  Follow-up CBG: Time: 1236 CBG Result: 81  Possible Reasons for Event: Inadequate meal intake  Comments/MD notified: Will continue to monitor    [place], Ralene [place]rat

## 2017-08-27 ENCOUNTER — Inpatient Hospital Stay (HOSPITAL_COMMUNITY): Payer: Medicare Other

## 2017-08-27 LAB — COMPREHENSIVE METABOLIC PANEL
ALBUMIN: 2.2 g/dL — AB (ref 3.5–5.0)
ALT: 36 U/L (ref 14–54)
ANION GAP: 14 (ref 5–15)
AST: 45 U/L — ABNORMAL HIGH (ref 15–41)
Alkaline Phosphatase: 176 U/L — ABNORMAL HIGH (ref 38–126)
BUN: 27 mg/dL — ABNORMAL HIGH (ref 6–20)
CALCIUM: 8.7 mg/dL — AB (ref 8.9–10.3)
CHLORIDE: 90 mmol/L — AB (ref 101–111)
CO2: 28 mmol/L (ref 22–32)
Creatinine, Ser: 4.54 mg/dL — ABNORMAL HIGH (ref 0.44–1.00)
GFR calc non Af Amer: 8 mL/min — ABNORMAL LOW (ref >60)
GFR, EST AFRICAN AMERICAN: 9 mL/min — AB (ref >60)
Glucose, Bld: 79 mg/dL (ref 65–99)
POTASSIUM: 3.8 mmol/L (ref 3.5–5.1)
SODIUM: 132 mmol/L — AB (ref 135–145)
Total Bilirubin: 2.4 mg/dL — ABNORMAL HIGH (ref 0.3–1.2)
Total Protein: 6.5 g/dL (ref 6.5–8.1)

## 2017-08-27 LAB — GLUCOSE, CAPILLARY
GLUCOSE-CAPILLARY: 68 mg/dL (ref 65–99)
GLUCOSE-CAPILLARY: 87 mg/dL (ref 65–99)
GLUCOSE-CAPILLARY: 53 mg/dL — AB (ref 65–99)
GLUCOSE-CAPILLARY: 57 mg/dL — AB (ref 65–99)
Glucose-Capillary: 73 mg/dL (ref 65–99)

## 2017-08-27 LAB — CBC
HCT: 25.3 % — ABNORMAL LOW (ref 36.0–46.0)
Hemoglobin: 8.1 g/dL — ABNORMAL LOW (ref 12.0–15.0)
MCH: 29 pg (ref 26.0–34.0)
MCHC: 32 g/dL (ref 30.0–36.0)
MCV: 90.7 fL (ref 78.0–100.0)
PLATELETS: 320 10*3/uL (ref 150–400)
RBC: 2.79 MIL/uL — ABNORMAL LOW (ref 3.87–5.11)
RDW: 15.3 % (ref 11.5–15.5)
WBC: 12.7 10*3/uL — ABNORMAL HIGH (ref 4.0–10.5)

## 2017-08-27 MED ORDER — GLUCAGON HCL RDNA (DIAGNOSTIC) 1 MG IJ SOLR: 1.0000 mg | Freq: Once | INTRAMUSCULAR | Status: DC | PRN

## 2017-08-27 MED ORDER — SODIUM CHLORIDE 0.9% FLUSH
10.0000 mL | Freq: Two times a day (BID) | INTRAVENOUS | Status: DC
  Administered 2017-08-28 – 2017-08-30 (×4): 10 mL
  Administered 2017-08-31: 20 mL
  Administered 2017-08-31 – 2017-09-01 (×3): 10 mL
  Administered 2017-09-02 – 2017-09-03 (×3): 20 mL
  Administered 2017-09-04 – 2017-09-20 (×25): 10 mL
  Administered 2017-09-21: 20 mL
  Administered 2017-09-21 – 2017-09-22 (×2): 10 mL
  Administered 2017-09-22: 20 mL
  Administered 2017-09-23: 10 mL

## 2017-08-27 MED ORDER — GLUCAGON HCL RDNA (DIAGNOSTIC) 1 MG IJ SOLR
INTRAMUSCULAR | Status: AC
  Administered 2017-08-27: 07:00:00
  Filled 2017-08-27: qty 1

## 2017-08-27 MED ORDER — FENTANYL CITRATE (PF) 100 MCG/2ML IJ SOLN: 12.5000 ug | INTRAMUSCULAR | Status: DC | PRN

## 2017-08-27 MED ORDER — SODIUM CHLORIDE 0.9% FLUSH: 10.0000 mL | INTRAVENOUS | Status: DC | PRN

## 2017-08-27 MED ORDER — DEXTROSE 50 % IV SOLN
INTRAVENOUS | Status: AC
  Administered 2017-08-27: 50 mL
  Filled 2017-08-27: qty 50

## 2017-08-27 MED ORDER — DEXTROSE 50 % IV SOLN
INTRAVENOUS | Status: AC
  Filled 2017-08-27: qty 50

## 2017-08-27 MED ORDER — TECHNETIUM TC 99M MEBROFENIN IV KIT
5.0000 | PACK | Freq: Once | INTRAVENOUS | Status: AC | PRN
  Administered 2017-08-27: 5 via INTRAVENOUS

## 2017-08-27 NOTE — Care Management Important Message (Signed)
Important Message  Patient Details  Name: Pam Fuller MRN: [id number] Date of Birth: Oct 15, 1932   Medicare Important Message Given:  Yes    [name] 08/27/2017, 12:39 PM

## 2017-08-27 NOTE — Progress Notes (Addendum)
[facility] Progress Note  3 Days Post-Op  Subjective: CC:  pts son is at bedside. Patient complains of nausea, vomiting, and inability to get comfortable in bed. She does reports some abdominal pain but is unable to describe it.   Objective: Vital signs in last 24 hours: Temp:  [97.3 F (36.3 C)-98.6 F (37 C)] 97.9 F (36.6 C) (04/09 0609) Pulse Rate:  [93-110] 93 (04/09 0609) Resp:  [12-19] 19 (04/08 2130) BP: (78-119)/(38-64) 119/50 (04/09 0609) SpO2:  [100 %] 100 % (04/09 0609) Last BM Date: 08/22/17  Intake/Output from previous day: 04/08 0701 - 04/09 0700 In: 300 [P.O.:100; IV Piggyback:200] Out: -253  Intake/Output this shift: No intake/output data recorded.  PE: Gen:  Alert, vomiting brown/bilious contents into basin  Card:  Regular rate and rhythm, no LE edema Pulm:  Normal effort, clear to auscultation bilaterally Abd: Soft, global tenderness with some guarding, no peritonitis, hypoactive BS Skin: warm and dry, no rashes  Psych: oriented to person, place this morning. When asked about the year she thought it was 2018.    Lab Results:  Recent Labs    08/25/17 0720 08/26/17 0732  WBC 11.1* 8.3  HGB 9.9* 8.0*  HCT 29.7* 24.3*  PLT 287 274   BMET Recent Labs    08/25/17 0720 08/26/17 0732  NA 135 131*  K 5.0 4.8  CL 92* 89*  CO2 22 23  GLUCOSE 96 91  BUN 44* 62*  CREATININE 6.78* 8.19*  CALCIUM 9.2 8.4*   PT/INR No results for input(s): LABPROT, INR in the last 72 hours. CMP     Component Value Date/Time   NA 131 (L) 08/26/2017 0732   NA 142 02/12/2017 1400   K 4.8 08/26/2017 0732   K 3.9 02/12/2017 1400   CL 89 (L) 08/26/2017 0732   CO2 23 08/26/2017 0732   CO2 30 (H) 02/12/2017 1400   GLUCOSE 91 08/26/2017 0732   GLUCOSE 88 02/12/2017 1400   BUN 62 (H) 08/26/2017 0732   BUN 16.9 02/12/2017 1400   CREATININE 8.19 (H) 08/26/2017 0732   CREATININE 6.2 (HH) 02/12/2017 1400   CALCIUM 8.4 (L) 08/26/2017 0732   CALCIUM 9.2  02/12/2017 1400   PROT 6.6 08/26/2017 0732   PROT 6.8 02/12/2017 1400   ALBUMIN 2.2 (L) 08/26/2017 0732   ALBUMIN 3.3 (L) 02/12/2017 1400   AST 35 08/26/2017 0732   AST 13 02/12/2017 1400   ALT 40 08/26/2017 0732   ALT 7 02/12/2017 1400   ALKPHOS 147 (H) 08/26/2017 0732   ALKPHOS 75 02/12/2017 1400   BILITOT 1.9 (H) 08/26/2017 0732   BILITOT 0.68 02/12/2017 1400   GFRNONAA 4 (L) 08/26/2017 0732   GFRAA 5 (L) 08/26/2017 0732   Lipase     Component Value Date/Time   LIPASE 36 08/25/2017 1618       Studies/Results: Ct Abdomen Pelvis Wo Contrast  Result Date: 08/26/2017 CLINICAL DATA:  Altered mental status and abdominal pain. Status post cholecystectomy 09/01/2017 EXAM: CT ABDOMEN AND PELVIS WITHOUT CONTRAST TECHNIQUE: Multidetector CT imaging of the abdomen and pelvis was performed following the standard protocol without IV contrast. COMPARISON:  CT scan 08/22/2017 FINDINGS: Lower chest: There are small bilateral pleural effusions and bibasilar atelectasis, right greater than left. The heart is mildly enlarged but stable. Stable coronary artery calcifications. Oral contrast is noted in a slightly dilated esophagus suggesting reflux. Hepatobiliary: No focal hepatic lesions or intrahepatic biliary dilatation. Surgical clips noted from recent cholecystectomy. There is some  type of surgical packing (Surgicel) in the gallbladder fossa. Moderate fluid surrounding the liver along with free intraperitoneal air, not unexpected. Small amount of fluid in the pericolic gutters and small to moderate amount of layering fluid and blood in the pelvis. No common bile duct dilatation. Pancreas: No mass, inflammation or ductal dilatation. Moderate pancreatic atrophy. Spleen: Normal size.  No focal lesions. Adrenals/Urinary Tract: The adrenal glands are unremarkable. The kidneys are quite small. Bilateral cysts are again noted. Stomach/Bowel: The stomach is well distended with contrast. No focal abnormalities.  The duodenal bulb and C-loop appear normal. The small bowel and colon are grossly normal. No findings for leaking oral contrast to suggest a bowel injury. No obstructive findings. Diffuse colonic diverticulosis without findings for acute diverticulitis. Vascular/Lymphatic: Stable atherosclerotic calcifications involving the aorta and branch vessels. No mesenteric or retroperitoneal mass or adenopathy. Reproductive: The uterus is surgically absent. Both ovaries are still present and appear normal. Other: Surgical changes involving the anterior abdominal wall. No hematoma or abscess. Musculoskeletal: No significant bony findings. IMPRESSION: 1. Small to moderate of fluid in the abdomen pelvis with some layering blood in the lower pelvis. This is probably normal postoperative change. Could not exclude the possibility of a bile leak. Nuclear medicine hepatobiliary scan may be helpful if patient does not improve. 2. No findings for bowel injury or bowel obstruction. 3. Small bilateral pleural effusions and bibasilar atelectasis. Electronically Signed   By: Marijo Sanes M.D.   On: 08/26/2017 17:48   Ct Head Wo Contrast  Result Date: 08/26/2017 CLINICAL DATA:  Altered mental status post cholecystectomy, history end-stage renal disease, breast cancer, hypertension EXAM: CT HEAD WITHOUT CONTRAST TECHNIQUE: Contiguous axial images were obtained from the base of the skull through the vertex without intravenous contrast. Sagittal and coronal MPR images reconstructed from axial data set. COMPARISON:  09/13/2015 FINDINGS: Brain: Generalized atrophy. Normal ventricular morphology. No midline shift or mass effect. Small vessel chronic ischemic changes of deep cerebral white matter. No intracranial hemorrhage, mass lesion, evidence of acute infarction, or extra-axial fluid collection. Vascular: Atherosclerotic calcifications of internal carotid arteries at skull base. No hyperdense vessels. Skull: Intact Sinuses/Orbits: Clear  Other: N/A IMPRESSION: Atrophy with small vessel chronic ischemic changes of deep cerebral white matter. No acute intracranial abnormalities. Electronically Signed   By: Lavonia Dana M.D.   On: 08/26/2017 17:31    Anti-infectives: Anti-infectives (From admission, onward)   Start     Dose/Rate Route Frequency Ordered Stop   09/15/2017 2200  piperacillin-tazobactam (ZOSYN) IVPB 3.375 g    Note to Pharmacy:  Zosyn 3.375 g IV q12h in ESRD on HD   3.375 g 12.5 mL/hr over 240 Minutes Intravenous Every 12 hours 09/07/2017 1155 08/25/17 1335   08/22/17 1000  piperacillin-tazobactam (ZOSYN) IVPB 3.375 g  Status:  Discontinued    Note to Pharmacy:  Zosyn 3.375 g IV q12h in ESRD on HD   3.375 g 12.5 mL/hr over 240 Minutes Intravenous Every 12 hours 08/22/17 0128 09/11/2017 1155   08/22/17 0045  piperacillin-tazobactam (ZOSYN) IVPB 3.375 g     3.375 g 12.5 mL/hr over 240 Minutes Intravenous  Once 08/22/17 0038 08/22/17 0145      Assessment/Plan HTN ESRD - HD MWF H/o breast cancer  Acute cholecystitis  POD 3 s/p lap chole 09/02/2017 by Dr. Redmond Pulling - some issues with pain post-operatively, now with nausea and vomiting overnight - back of diet to NPO and place NG tube  - check CBC, CMET, ABD film  ID -zosyn  4/4>>4/7 FEN -NPO, IVF, NGT to LIWS VTE -SCDs, heparin Foley -none Follow up -[facility] clinic  Plan-place NG tube. Check labs and imaging to r/o post-operative ileus or complication.     LOS: 5 days    [facility] 08/27/2017, 8:37 AM Pager: [phone] Consults: [phone] Mon-Fri 7:00 am-4:30 pm Sat-Sun 7:00 am-11:30 am

## 2017-08-27 NOTE — Progress Notes (Addendum)
[facility] Progress Note   Subjective: C/os n/v overnight, but feeling better right now. Denies SOB, dyspnea, or edema.   HD yest- did not remove any volume secondary to low BP  Objective Vitals:   08/26/17 1130 08/26/17 1215 08/26/17 2130 08/27/17 0609  BP: (!) 97/54 (!) 98/49 114/64 (!) 119/50  Pulse: 99 (!) 106 (!) 108 93  Resp: 14  19   Temp: (!) 97.3 F (36.3 C) 98.2 F (36.8 C) 98.6 F (37 C) 97.9 F (36.6 C)  TempSrc: Oral Oral Oral Oral  SpO2: 100% 100% 100% 100%  Weight:       Physical Exam General:NAD, thin elderly female,more interactive today  Heart:RRR, 2/6 systolic murmur. No rub or gallop Lungs:CTAB anteriorly  Abdomen:soft, ND Extremities:no edema Dialysis Access: LU AVG +b/t   Filed Weights   08/23/17 1051 09/02/2017 0559 08/26/17 0700  Weight: 60.5 kg (133 lb 6.1 oz) 50.9 kg (112 lb 3.4 oz) 65.4 kg (144 lb 2.9 oz)    Intake/Output Summary (Last 24 hours) at 08/27/2017 1005 Last data filed at 08/27/2017 0400 Gross per 24 hour  Intake 300 ml  Output -253 ml  Net 553 ml    Additional Objective Labs: Basic Metabolic Panel: Recent Labs  Lab 08/23/17 0700 08/23/17 1438 08/28/2017 0546 08/25/17 0720 08/26/17 0732  NA 132* 136 133* 135 131*  K 3.6 4.1 3.9 5.0 4.8  CL 90* 94* 91* 92* 89*  CO2 27 28 26 22 23   GLUCOSE 69 90 69 96 91  BUN 32* 11 21* 44* 62*  CREATININE 7.62* 3.63* 5.25* 6.78* 8.19*  CALCIUM 8.2* 8.2* 8.4* 9.2 8.4*  PHOS 5.9* 3.0  --   --   --    Liver Function Tests: Recent Labs  Lab 09/03/2017 0546 08/25/17 0720 08/26/17 0732  AST 66* 60* 35  ALT 51 57* 40  ALKPHOS 134* 135* 147*  BILITOT 1.9* 2.0* 1.9*  PROT 6.5 7.0 6.6  ALBUMIN 2.5* 2.6* 2.2*   Recent Labs  Lab 09/11/2017 1618  LIPASE 36   CBC: Recent Labs  Lab 08/26/2017 1618  08/23/17 0700 09/15/2017 0546 08/25/17 0720 08/26/17 0732 08/27/17 0921  WBC 11.8*   < > 13.2* 9.1 11.1* 8.3 12.7*  NEUTROABS 10.9*  --   --   --   --   --   --   HGB 10.7*    < > 9.0* 8.3* 9.9* 8.0* 8.1*  HCT 33.5*   < > 28.0* 25.0* 29.7* 24.3* 25.3*  MCV 93.1   < > 90.3 91.2 90.5 91.0 90.7  PLT 216   < > 191 167 287 274 320   < > = values in this interval not displayed.   CBG: Recent Labs  Lab 08/26/17 1236 08/26/17 1651 08/26/17 2205 08/27/17 0607 08/27/17 0656  GLUCAP 81 114* 86 68 87   Studies/Results: Ct Abdomen Pelvis Wo Contrast  Result Date: 08/26/2017 CLINICAL DATA:  Altered mental status and abdominal pain. Status post cholecystectomy 09/14/2017 EXAM: CT ABDOMEN AND PELVIS WITHOUT CONTRAST TECHNIQUE: Multidetector CT imaging of the abdomen and pelvis was performed following the standard protocol without IV contrast. COMPARISON:  CT scan 09/13/2017 FINDINGS: Lower chest: There are small bilateral pleural effusions and bibasilar atelectasis, right greater than left. The heart is mildly enlarged but stable. Stable coronary artery calcifications. Oral contrast is noted in a slightly dilated esophagus suggesting reflux. Hepatobiliary: No focal hepatic lesions or intrahepatic biliary dilatation. Surgical clips noted from recent cholecystectomy. There is some type  of surgical packing (Surgicel) in the gallbladder fossa. Moderate fluid surrounding the liver along with free intraperitoneal air, not unexpected. Small amount of fluid in the pericolic gutters and small to moderate amount of layering fluid and blood in the pelvis. No common bile duct dilatation. Pancreas: No mass, inflammation or ductal dilatation. Moderate pancreatic atrophy. Spleen: Normal size.  No focal lesions. Adrenals/Urinary Tract: The adrenal glands are unremarkable. The kidneys are quite small. Bilateral cysts are again noted. Stomach/Bowel: The stomach is well distended with contrast. No focal abnormalities. The duodenal bulb and C-loop appear normal. The small bowel and colon are grossly normal. No findings for leaking oral contrast to suggest a bowel injury. No obstructive findings. Diffuse  colonic diverticulosis without findings for acute diverticulitis. Vascular/Lymphatic: Stable atherosclerotic calcifications involving the aorta and branch vessels. No mesenteric or retroperitoneal mass or adenopathy. Reproductive: The uterus is surgically absent. Both ovaries are still present and appear normal. Other: Surgical changes involving the anterior abdominal wall. No hematoma or abscess. Musculoskeletal: No significant bony findings. IMPRESSION: 1. Small to moderate of fluid in the abdomen pelvis with some layering blood in the lower pelvis. This is probably normal postoperative change. Could not exclude the possibility of a bile leak. Nuclear medicine hepatobiliary scan may be helpful if patient does not improve. 2. No findings for bowel injury or bowel obstruction. 3. Small bilateral pleural effusions and bibasilar atelectasis. Electronically Signed   By: Marijo Sanes M.D.   On: 08/26/2017 17:48   Ct Head Wo Contrast  Result Date: 08/26/2017 CLINICAL DATA:  Altered mental status post cholecystectomy, history end-stage renal disease, breast cancer, hypertension EXAM: CT HEAD WITHOUT CONTRAST TECHNIQUE: Contiguous axial images were obtained from the base of the skull through the vertex without intravenous contrast. Sagittal and coronal MPR images reconstructed from axial data set. COMPARISON:  09/13/2015 FINDINGS: Brain: Generalized atrophy. Normal ventricular morphology. No midline shift or mass effect. Small vessel chronic ischemic changes of deep cerebral white matter. No intracranial hemorrhage, mass lesion, evidence of acute infarction, or extra-axial fluid collection. Vascular: Atherosclerotic calcifications of internal carotid arteries at skull base. No hyperdense vessels. Skull: Intact Sinuses/Orbits: Clear Other: N/A IMPRESSION: Atrophy with small vessel chronic ischemic changes of deep cerebral white matter. No acute intracranial abnormalities. Electronically Signed   By: Lavonia Dana M.D.    On: 08/26/2017 17:31   Dg Abd Portable 1v  Result Date: 08/27/2017 CLINICAL DATA:  Postop from laparoscopic cholecystectomy. Persistent vomiting. EXAM: PORTABLE ABDOMEN - 1 VIEW COMPARISON:  CT on 08/26/2017 FINDINGS: Oral contrast from recent CT is seen throughout the colon which is nondilated. No evidence of dilated small bowel loops. Right upper quadrant surgical clips are seen from cholecystectomy. IMPRESSION: Normal bowel gas pattern. Electronically Signed   By: Earle Gell M.D.   On: 08/27/2017 09:13    Medications:  . cloNIDine  0.1 mg Transdermal Q Wed  . dextrose      . docusate sodium  100 mg Oral BID  . doxercalciferol  2 mcg Intravenous Q M,W,F-HD  . haloperidol lactate  2 mg Intravenous Once  . heparin injection (subcutaneous)  5,000 Units Subcutaneous Q8H  . sodium chloride flush  10-40 mL Intracatheter Q12H    Dialysis Orders: MWF [facility] 3h 36min 400/A1.5   61.5kg  2/2.25 bath  P4  AVG LUE   Hep 4000 - Hectoral 63mcg IV q HD - Mircera 56mcg IV q 2 weeks (last 3/27)  Assessment/Plan: 1. Acute cholecystitis - s/p lap chole 4/6 Dr. Redmond Pulling.  Post op pain, N/V overnight, suspected post op ileus, KUB looks ok, per surgery.  Narcotics d/c, to place NG tube if NV continues. Per surgery/primary 2. ESRD - tolerating HD. Continue regular MWF schedule while admitted. K 4.8 3. Anemia of CKD- Hgb 8.1. Aranesp 11mcg given 4/8 4. Secondary hyperparathyroidism - Ca ok. Holding binders while patient not eating. Continue VDRA.  5. HTN/volume - BP mostly low.  Appears euvolemic on exam. Titrate down volume as tolerated. On clonidine patch 6. Nutrition - Renal diet once advanced.   Jen Mow, PA-C [facility] Pager: [phone] 08/27/2017,10:05 AM  LOS: 5 days    Patient seen and examined, agree with above note with above modifications. Pt more interactive today- does not really remember HD yest- passing gas.  Plan for routine HD tomorrow with some volume removal   Corliss Parish, MD 08/27/2017

## 2017-08-27 NOTE — Progress Notes (Signed)
Patient ID: Pam Fuller, female   DOB: 1932/09/30, 84 y.o.   MRN: [id number]                                                                PROGRESS NOTE                                                                                                                                                                                                             Patient Demographics:    Pam Fuller, is a 84 y.o. female, DOB - November 05, 1932, DQQ:[id number]  Admit date - 09/13/2017   Admitting Physician Rise Patience, MD  Outpatient Primary MD for the patient is Patient, No Pcp Per  LOS - 5  Outpatient Specialists:     Chief Complaint  Patient presents with  . Abdominal Pain       Brief Narrative     84 y.o.femalewithwith history of ESRD on hemodialysis Monday Wednesday and Friday, hypertension, anemia and history of breast cancer presents to the ER because of abdominal pain. Patient has been having right lower quadrant abdominal pain for last 2 days. Has been having nausea vomiting. Denies any diarrhea fever or chills. Denies any chest pain or shortness of breath. Had dialysis yesterday.  ED Course:In the ER CT abdomen pelvis shows features consistent with acute cholecystitis. On-call general surgeon was consulted. Patient was started on cholecystitis. Patient has still not decided if patient was to have surgery for drain. Okay with antibiotics. At the time of my exam patient has not already received pain medication and states her abdominal pain is improved.      Subjective:    [name] today states had n/v x1 last nite, but doing better this am. Pt state lower abdominal pain improving.    No headache, No chest pain, No abdominal pain - No Nausea, No new weakness tingling or numbness, No Cough - SOB.    Assessment  & Plan :    Principal Problem:   Acute cholecystitis Active Problems:   ESRD on dialysis [facility])   Essential hypertension   GERD  (gastroesophageal reflux disease)      1. Acute cholecystitis: Lap chole 4/6 Start miralax 17gm po qday prn constipation 4/7 Pain uncontrolled Change from morhpine sulfate to dilaudid 1mg  iv q3h prn  CT scan abd / pelvis: => slight fluid in lower abd, most likely result of post-operative change Appreciate surgery input  2.  AMS ? Due to opioids, try to minimize narcotics, seems improved and back to baseline this am CT brain 4/8 => negative    3. ESRD: HD(M, W, F)Continue per Nephrology cmp in am  4. HTN: Controlled  5. Hx of Breast cancer: Stable. Follow up with oncology  6. Anemia of chronic dz: stable H/H. Watch for post-op anemia. Cbc in am      Code Status:FULL CODE  Family Communication :w family, son  Disposition Plan:home  Barriers For Discharge:  Consults :Surgery,nephrology  Procedures :   DVT Prophylaxis: - Heparin - SCDs      RecentLabs  Lab Results  Component Value Date   PLT 274 08/26/2017      Antibiotics  :  Zosyn 4/4=> 4/6       Anti-infectives (From admission, onward)   Start     Dose/Rate Route Frequency Ordered Stop   08/19/2017 2200  piperacillin-tazobactam (ZOSYN) IVPB 3.375 g    Note to Pharmacy:  Zosyn 3.375 g IV q12h in ESRD on HD   3.375 g 12.5 mL/hr over 240 Minutes Intravenous Every 12 hours 08/30/2017 1155 08/25/17 1335   08/22/17 1000  piperacillin-tazobactam (ZOSYN) IVPB 3.375 g  Status:  Discontinued    Note to Pharmacy:  Zosyn 3.375 g IV q12h in ESRD on HD   3.375 g 12.5 mL/hr over 240 Minutes Intravenous Every 12 hours 08/22/17 0128 08/23/2017 1155   08/22/17 0045  piperacillin-tazobactam (ZOSYN) IVPB 3.375 g     3.375 g 12.5 mL/hr over 240 Minutes Intravenous  Once 08/22/17 0038 08/22/17 0145        Objective:   Vitals:   08/26/17 1130 08/26/17 1215 08/26/17 2130 08/27/17 0609  BP: (!) 97/54 (!) 98/49 114/64 (!) 119/50  Pulse: 99 (!) 106 (!) 108 93  Resp:  14  19   Temp: (!) 97.3 F (36.3 C) 98.2 F (36.8 C) 98.6 F (37 C) 97.9 F (36.6 C)  TempSrc: Oral Oral Oral Oral  SpO2: 100% 100% 100% 100%  Weight:        Wt Readings from Last 3 Encounters:  08/26/17 65.4 kg (144 lb 2.9 oz)  07/04/17 64 kg (141 lb)  02/12/17 62.7 kg (138 lb 3.2 oz)     Intake/Output Summary (Last 24 hours) at 08/27/2017 0757 Last data filed at 08/27/2017 0400 Gross per 24 hour  Intake 300 ml  Output -253 ml  Net 553 ml     Physical Exam  Awake Alert, Oriented X 3, No new F.N deficits, Normal affect [place].AT,PERRAL Supple Neck,No JVD, No cervical lymphadenopathy appriciated.  Symmetrical Chest wall movement, Good air movement bilaterally, CTAB RRR,No Gallops,Rubs or new Murmurs, No Parasternal Heave +ve B.Sounds, Abd Soft, No tenderness, No organomegaly appriciated, No rebound - guarding or rigidity. No Cyanosis, Clubbing or edema, No new Rash or bruise     Data Review:    CBC Recent Labs  Lab 08/27/2017 1618 08/22/17 0636 08/23/17 0700 09/08/2017 0546 08/25/17 0720 08/26/17 0732  WBC 11.8* 14.9* 13.2* 9.1 11.1* 8.3  HGB 10.7* 10.5* 9.0* 8.3* 9.9* 8.0*  HCT 33.5* 32.8* 28.0* 25.0* 29.7* 24.3*  PLT 216 199 191 167 287 274  MCV 93.1 93.2 90.3 91.2 90.5 91.0  MCH 29.7 29.8 29.0 30.3 30.2 30.0  MCHC 31.9 32.0 32.1 33.2 33.3 32.9  RDW 15.0 15.2 15.1 15.4 14.9 15.4  LYMPHSABS 0.6*  --   --   --   --   --   MONOABS 0.2  --   --   --   --   --   EOSABS 0.0  --   --   --   --   --   BASOSABS 0.0  --   --   --   --   --     Chemistries  Recent Labs  Lab 09/03/2017 1618  08/23/17 0700 08/23/17 1438 09/03/2017 0546 08/25/17 0720 08/26/17 0732  NA 136   < > 132* 136 133* 135 131*  K 2.8*   < > 3.6 4.1 3.9 5.0 4.8  CL 100*   < > 90* 94* 91* 92* 89*  CO2 27   < > 27 28 26 22 23   GLUCOSE 155*   < > 69 90 69 96 91  BUN 9   < > 32* 11 21* 44* 62*  CREATININE 4.43*   < > 7.62* 3.63* 5.25* 6.78* 8.19*  CALCIUM 7.8*   < > 8.2* 8.2* 8.4* 9.2 8.4*  AST  23  --  91*  --  66* 60* 35  ALT 12*  --  69*  --  51 57* 40  ALKPHOS 66  --  127*  --  134* 135* 147*  BILITOT 1.0  --  1.6*  --  1.9* 2.0* 1.9*   < > = values in this interval not displayed.   ------------------------------------------------------------------------------------------------------------------ No results for input(s): CHOL, HDL, LDLCALC, TRIG, CHOLHDL, LDLDIRECT in the last 72 hours.  Lab Results  Component Value Date   HGBA1C  04/01/2010    5.4 (NOTE)                                                                       According to the ADA Clinical Practice Recommendations for 2011, when HbA1c is used as a screening test:   >=6.5%   Diagnostic of Diabetes Mellitus           (if abnormal result  is confirmed)  5.7-6.4%   Increased risk of developing Diabetes Mellitus  References:Diagnosis and Classification of Diabetes Mellitus,Diabetes Care,2011,34(Suppl 1):S62-S69 and Standards of Medical Care in         Diabetes - 2011,Diabetes WUJW,1191,47  (Suppl 1):S11-S61.   ------------------------------------------------------------------------------------------------------------------ No results for input(s): TSH, T4TOTAL, T3FREE, THYROIDAB in the last 72 hours.  Invalid input(s): FREET3 ------------------------------------------------------------------------------------------------------------------ No results for input(s): VITAMINB12, FOLATE, FERRITIN, TIBC, IRON, RETICCTPCT in the last 72 hours.  Coagulation profile No results for input(s): INR, PROTIME in the last 168 hours.  No results for input(s): DDIMER in the last 72 hours.  Cardiac Enzymes No results for input(s): CKMB, TROPONINI, MYOGLOBIN in the last 168 hours.  Invalid input(s): CK ------------------------------------------------------------------------------------------------------------------ No results found for: BNP  Inpatient Medications  Scheduled Meds: . cloNIDine  0.1 mg Transdermal Q Wed  .  dextrose      . docusate sodium  100 mg Oral BID  . doxercalciferol  2 mcg Intravenous Q M,W,F-HD  . haloperidol lactate  2 mg Intravenous Once  . heparin injection (subcutaneous)  5,000 Units Subcutaneous Q8H   Continuous Infusions: PRN Meds:.acetaminophen **OR** acetaminophen, glucagon (human recombinant), hydrALAZINE, HYDROmorphone (DILAUDID) injection,  ondansetron **OR** ondansetron (ZOFRAN) IV, polyethylene glycol, promethazine, traMADol  Micro Results Recent Results (from the past 240 hour(s))  Surgical pcr screen     Status: None   Collection Time: 08/22/17  4:03 AM  Result Value Ref Range Status   MRSA, PCR NEGATIVE NEGATIVE Final   Staphylococcus aureus NEGATIVE NEGATIVE Final    Comment: (NOTE) The Xpert SA Assay (FDA approved for NASAL specimens in patients 59 years of age and older), is one component of a comprehensive surveillance program. It is not intended to diagnose infection nor to guide or monitor treatment. Performed at [facility], [place] [address] [place], [place] [address]     Radiology Reports Ct Abdomen Pelvis Wo Contrast  Result Date: 08/26/2017 CLINICAL DATA:  Altered mental status and abdominal pain. Status post cholecystectomy 09/06/2017 EXAM: CT ABDOMEN AND PELVIS WITHOUT CONTRAST TECHNIQUE: Multidetector CT imaging of the abdomen and pelvis was performed following the standard protocol without IV contrast. COMPARISON:  CT scan 09/09/2017 FINDINGS: Lower chest: There are small bilateral pleural effusions and bibasilar atelectasis, right greater than left. The heart is mildly enlarged but stable. Stable coronary artery calcifications. Oral contrast is noted in a slightly dilated esophagus suggesting reflux. Hepatobiliary: No focal hepatic lesions or intrahepatic biliary dilatation. Surgical clips noted from recent cholecystectomy. There is some type of surgical packing (Surgicel) in the gallbladder fossa. Moderate fluid surrounding the liver along with  free intraperitoneal air, not unexpected. Small amount of fluid in the pericolic gutters and small to moderate amount of layering fluid and blood in the pelvis. No common bile duct dilatation. Pancreas: No mass, inflammation or ductal dilatation. Moderate pancreatic atrophy. Spleen: Normal size.  No focal lesions. Adrenals/Urinary Tract: The adrenal glands are unremarkable. The kidneys are quite small. Bilateral cysts are again noted. Stomach/Bowel: The stomach is well distended with contrast. No focal abnormalities. The duodenal bulb and C-loop appear normal. The small bowel and colon are grossly normal. No findings for leaking oral contrast to suggest a bowel injury. No obstructive findings. Diffuse colonic diverticulosis without findings for acute diverticulitis. Vascular/Lymphatic: Stable atherosclerotic calcifications involving the aorta and branch vessels. No mesenteric or retroperitoneal mass or adenopathy. Reproductive: The uterus is surgically absent. Both ovaries are still present and appear normal. Other: Surgical changes involving the anterior abdominal wall. No hematoma or abscess. Musculoskeletal: No significant bony findings. IMPRESSION: 1. Small to moderate of fluid in the abdomen pelvis with some layering blood in the lower pelvis. This is probably normal postoperative change. Could not exclude the possibility of a bile leak. Nuclear medicine hepatobiliary scan may be helpful if patient does not improve. 2. No findings for bowel injury or bowel obstruction. 3. Small bilateral pleural effusions and bibasilar atelectasis. Electronically Signed   By: Marijo Sanes M.D.   On: 08/26/2017 17:48   Ct Abdomen Pelvis Wo Contrast  Result Date: 08/22/2017 CLINICAL DATA:  Right lower quadrant pain. EXAM: CT ABDOMEN AND PELVIS WITHOUT CONTRAST TECHNIQUE: Multidetector CT imaging of the abdomen and pelvis was performed following the standard protocol without IV contrast. COMPARISON:  Lumbar spine x-rays dated  September 13, 2015. CT abdomen pelvis dated March 31, 2010. FINDINGS: Lower chest: No acute abnormality. Hepatobiliary: No focal liver abnormality. The gallbladder is distended and contains multiple small gallstones, one of which is seen in the gallbladder neck. Mild gallbladder wall thickening with surrounding pericholecystic fluid. Mild dilatation of the common bile duct, measuring up to 10 mm. Pancreas: Mild atrophy. No ductal dilatation or surrounding inflammatory  changes. Spleen: Normal in size without focal abnormality. Adrenals/Urinary Tract: Adrenal glands are unremarkable. Severe atrophy of the bilateral kidneys. No renal or ureteral calculi. No hydronephrosis. The bladder is unremarkable. Stomach/Bowel: Small hiatal hernia. The stomach is distended with oral contrast. No evidence of bowel wall thickening, distention, or surrounding inflammatory changes. Extensive colonic diverticulosis. Vascular/Lymphatic: Aortic atherosclerosis. No enlarged abdominal or pelvic lymph nodes. Reproductive: Status post hysterectomy. No adnexal masses. Other: No abdominal wall hernia or abnormality. Trace free fluid in the pelvis. No pneumoperitoneum. Musculoskeletal: Mild superior endplate compression deformities of L1 and L2 appear chronic but are new when compared to prior study. IMPRESSION: 1. Prominently distended gallbladder with a gallstone lodged in the gallbladder neck. Mild gallbladder wall thickening and surrounding pericholecystic fluid. Findings are concerning for acute cholecystitis. 2. Mild dilatation of the common bile duct, measuring up to 10 mm. No obstructing mass or stones seen. 3.  Aortic atherosclerosis (ICD10-I70.0). Electronically Signed   By: Titus Dubin M.D.   On: 08/22/2017 00:03   Ct Head Wo Contrast  Result Date: 08/26/2017 CLINICAL DATA:  Altered mental status post cholecystectomy, history end-stage renal disease, breast cancer, hypertension EXAM: CT HEAD WITHOUT CONTRAST TECHNIQUE:  Contiguous axial images were obtained from the base of the skull through the vertex without intravenous contrast. Sagittal and coronal MPR images reconstructed from axial data set. COMPARISON:  09/13/2015 FINDINGS: Brain: Generalized atrophy. Normal ventricular morphology. No midline shift or mass effect. Small vessel chronic ischemic changes of deep cerebral white matter. No intracranial hemorrhage, mass lesion, evidence of acute infarction, or extra-axial fluid collection. Vascular: Atherosclerotic calcifications of internal carotid arteries at skull base. No hyperdense vessels. Skull: Intact Sinuses/Orbits: Clear Other: N/A IMPRESSION: Atrophy with small vessel chronic ischemic changes of deep cerebral white matter. No acute intracranial abnormalities. Electronically Signed   By: Lavonia Dana M.D.   On: 08/26/2017 17:31    Time Spent in minutes  30   Jani Gravel M.D on 08/27/2017 at 7:57 AM  Between 7am to 7pm - Pager - [phone]    After 7pm go to [email or web address] - password [facility]  [facility] -  Office  [phone]

## 2017-08-27 NOTE — Progress Notes (Signed)
Pt's CBG this AM is 68. Refused to take anything PO. Lost IV access.Glucagon 1mg  given IM. Rechecked CBG, ang it go up to 87. C. Bodeheimer, NP made aware.Place IV team consult to establish PIV.

## 2017-08-28 DIAGNOSIS — K9189 Other postprocedural complications and disorders of digestive system: Secondary | ICD-10-CM

## 2017-08-28 DIAGNOSIS — K838 Other specified diseases of biliary tract: Secondary | ICD-10-CM | POA: Diagnosis present

## 2017-08-28 LAB — GLUCOSE, CAPILLARY
GLUCOSE-CAPILLARY: 199 mg/dL — AB (ref 65–99)
GLUCOSE-CAPILLARY: 66 mg/dL (ref 65–99)
GLUCOSE-CAPILLARY: 71 mg/dL (ref 65–99)
GLUCOSE-CAPILLARY: 77 mg/dL (ref 65–99)
Glucose-Capillary: 109 mg/dL — ABNORMAL HIGH (ref 65–99)
Glucose-Capillary: 60 mg/dL — ABNORMAL LOW (ref 65–99)
Glucose-Capillary: 71 mg/dL (ref 65–99)
Glucose-Capillary: 99 mg/dL (ref 65–99)

## 2017-08-28 LAB — CBC
HEMATOCRIT: 24.5 % — AB (ref 36.0–46.0)
HEMOGLOBIN: 8.2 g/dL — AB (ref 12.0–15.0)
MCH: 29.7 pg (ref 26.0–34.0)
MCHC: 33.5 g/dL (ref 30.0–36.0)
MCV: 88.8 fL (ref 78.0–100.0)
Platelets: 357 10*3/uL (ref 150–400)
RBC: 2.76 MIL/uL — ABNORMAL LOW (ref 3.87–5.11)
RDW: 15.4 % (ref 11.5–15.5)
WBC: 11.8 10*3/uL — ABNORMAL HIGH (ref 4.0–10.5)

## 2017-08-28 LAB — COMPREHENSIVE METABOLIC PANEL
ALBUMIN: 1.8 g/dL — AB (ref 3.5–5.0)
ALK PHOS: 177 U/L — AB (ref 38–126)
ALT: 28 U/L (ref 14–54)
ANION GAP: 20 — AB (ref 5–15)
AST: 28 U/L (ref 15–41)
BILIRUBIN TOTAL: 2.5 mg/dL — AB (ref 0.3–1.2)
BUN: 38 mg/dL — ABNORMAL HIGH (ref 6–20)
CALCIUM: 8.7 mg/dL — AB (ref 8.9–10.3)
CO2: 26 mmol/L (ref 22–32)
CREATININE: 6.08 mg/dL — AB (ref 0.44–1.00)
Chloride: 87 mmol/L — ABNORMAL LOW (ref 101–111)
GFR calc Af Amer: 7 mL/min — ABNORMAL LOW (ref >60)
GFR calc non Af Amer: 6 mL/min — ABNORMAL LOW (ref >60)
GLUCOSE: 147 mg/dL — AB (ref 65–99)
Potassium: 3.7 mmol/L (ref 3.5–5.1)
Sodium: 133 mmol/L — ABNORMAL LOW (ref 135–145)
TOTAL PROTEIN: 5.8 g/dL — AB (ref 6.5–8.1)

## 2017-08-28 LAB — PROTIME-INR
INR: 1.53
Prothrombin Time: 18.3 seconds — ABNORMAL HIGH (ref 11.4–15.2)

## 2017-08-28 MED ORDER — DEXTROSE 50 % IV SOLN
INTRAVENOUS | Status: AC
  Administered 2017-08-28: 50 mL
  Filled 2017-08-28: qty 50

## 2017-08-28 MED ORDER — HEPARIN SODIUM (PORCINE) 5000 UNIT/ML IJ SOLN
5000.0000 [IU] | Freq: Three times a day (TID) | INTRAMUSCULAR | Status: AC
  Administered 2017-08-28 (×2): 5000 [IU] via SUBCUTANEOUS
  Filled 2017-08-28 (×2): qty 1

## 2017-08-28 MED ORDER — SODIUM CHLORIDE 0.9 % IV SOLN
1.5000 g | Freq: Once | INTRAVENOUS | Status: DC
  Filled 2017-08-28 (×3): qty 1.5

## 2017-08-28 MED ORDER — ALBUMIN HUMAN 25 % IV SOLN
25.0000 g | Freq: Once | INTRAVENOUS | Status: DC
  Filled 2017-08-28: qty 100

## 2017-08-28 MED ORDER — HEPARIN SODIUM (PORCINE) 5000 UNIT/ML IJ SOLN: 5000.0000 [IU] | Freq: Three times a day (TID) | INTRAMUSCULAR | Status: DC

## 2017-08-28 MED ORDER — DEXTROSE 50 % IV SOLN
INTRAVENOUS | Status: AC
  Filled 2017-08-28: qty 50

## 2017-08-28 MED ORDER — GUAIFENESIN 100 MG/5ML PO SOLN
5.0000 mL | ORAL | Status: DC | PRN
  Administered 2017-08-28: 100 mg via ORAL
  Filled 2017-08-28 (×2): qty 5

## 2017-08-28 MED ORDER — DEXTROSE 50 % IV SOLN
INTRAVENOUS | Status: AC
  Administered 2017-08-28: 25 mL
  Filled 2017-08-28: qty 50

## 2017-08-28 MED ORDER — ALBUMIN HUMAN 25 % IV SOLN
INTRAVENOUS | Status: AC
  Filled 2017-08-28: qty 100

## 2017-08-28 MED ORDER — DEXTROSE 50 % IV SOLN
50.0000 mL | Freq: Once | INTRAVENOUS | Status: AC
  Administered 2017-08-28: 50 mL via INTRAVENOUS

## 2017-08-28 MED ORDER — ALBUMIN HUMAN 25 % IV SOLN
50.0000 g | Freq: Once | INTRAVENOUS | Status: DC
  Filled 2017-08-28: qty 200

## 2017-08-28 NOTE — Procedures (Signed)
Patient was seen on dialysis and the procedure was supervised.  BFR 400  Via AVG BP is  101/56.   Patient appears to be tolerating treatment well  Navreet Bolda A 08/28/2017

## 2017-08-28 NOTE — Progress Notes (Signed)
Pt confused upon arrival to the HD unit; asking where she was; what time and day it was. Pt restless during HD; checked her CBG=82; BPs dropped during HD tx; Dr. Corliss Parish ordered a one time dose of Albumin 25g IV which was administered and the hypotension resolved. Pt restless and confused throughout HD tx; VS signs remained stable. Noted spitting up yellowish colored sputum.

## 2017-08-28 NOTE — Progress Notes (Signed)
[facility] Progress Note  4 Days Post-Op  Subjective: CC:  Sitting up in chair, states she feels much better today. Family is at bedside. Reports mild abdominal soreness. Denies nausea/vomting. Denies BM.   Objective: Vital signs in last 24 hours: Temp:  [97.8 F (36.6 C)-98.4 F (36.9 C)] 97.8 F (36.6 C) (04/10 0442) Pulse Rate:  [95-104] 104 (04/10 0442) Resp:  [12-16] 16 (04/09 2210) BP: (112-133)/(57-100) 127/57 (04/10 0442) SpO2:  [100 %] 100 % (04/10 0442) Last BM Date: 08/22/17  Intake/Output from previous day: 04/09 0701 - 04/10 0700 In: 0  Out: 650 [Emesis/NG output:650] Intake/Output this shift: No intake/output data recorded.  PE: Gen:  Alert, NAD, pleasant Pulm:  Normal effort Abd: Soft, mild TTP RUQ and LUQ without guarding or peritonitis, hypoactive BS, incisions c/d/i w/ bandaids in place.  Skin: warm and dry, no rashes  Psych: A&Ox3   Lab Results:  Recent Labs    08/27/17 0921 08/28/17 0522  WBC 12.7* 11.8*  HGB 8.1* 8.2*  HCT 25.3* 24.5*  PLT 320 357   BMET Recent Labs    08/26/17 0732 08/27/17 0921  NA 131* 132*  K 4.8 3.8  CL 89* 90*  CO2 23 28  GLUCOSE 91 79  BUN 62* 27*  CREATININE 8.19* 4.54*  CALCIUM 8.4* 8.7*   PT/INR No results for input(s): LABPROT, INR in the last 72 hours. CMP     Component Value Date/Time   NA 132 (L) 08/27/2017 0921   NA 142 02/12/2017 1400   K 3.8 08/27/2017 0921   K 3.9 02/12/2017 1400   CL 90 (L) 08/27/2017 0921   CO2 28 08/27/2017 0921   CO2 30 (H) 02/12/2017 1400   GLUCOSE 79 08/27/2017 0921   GLUCOSE 88 02/12/2017 1400   BUN 27 (H) 08/27/2017 0921   BUN 16.9 02/12/2017 1400   CREATININE 4.54 (H) 08/27/2017 0921   CREATININE 6.2 (HH) 02/12/2017 1400   CALCIUM 8.7 (L) 08/27/2017 0921   CALCIUM 9.2 02/12/2017 1400   PROT 6.5 08/27/2017 0921   PROT 6.8 02/12/2017 1400   ALBUMIN 2.2 (L) 08/27/2017 0921   ALBUMIN 3.3 (L) 02/12/2017 1400   AST 45 (H) 08/27/2017 0921   AST 13  02/12/2017 1400   ALT 36 08/27/2017 0921   ALT 7 02/12/2017 1400   ALKPHOS 176 (H) 08/27/2017 0921   ALKPHOS 75 02/12/2017 1400   BILITOT 2.4 (H) 08/27/2017 0921   BILITOT 0.68 02/12/2017 1400   GFRNONAA 8 (L) 08/27/2017 0921   GFRAA 9 (L) 08/27/2017 0921   Lipase     Component Value Date/Time   LIPASE 36 08/23/2017 1618       Studies/Results: Ct Abdomen Pelvis Wo Contrast  Result Date: 08/26/2017 CLINICAL DATA:  Altered mental status and abdominal pain. Status post cholecystectomy 08/30/2017 EXAM: CT ABDOMEN AND PELVIS WITHOUT CONTRAST TECHNIQUE: Multidetector CT imaging of the abdomen and pelvis was performed following the standard protocol without IV contrast. COMPARISON:  CT scan 09/15/2017 FINDINGS: Lower chest: There are small bilateral pleural effusions and bibasilar atelectasis, right greater than left. The heart is mildly enlarged but stable. Stable coronary artery calcifications. Oral contrast is noted in a slightly dilated esophagus suggesting reflux. Hepatobiliary: No focal hepatic lesions or intrahepatic biliary dilatation. Surgical clips noted from recent cholecystectomy. There is some type of surgical packing (Surgicel) in the gallbladder fossa. Moderate fluid surrounding the liver along with free intraperitoneal air, not unexpected. Small amount of fluid in the pericolic gutters and small  to moderate amount of layering fluid and blood in the pelvis. No common bile duct dilatation. Pancreas: No mass, inflammation or ductal dilatation. Moderate pancreatic atrophy. Spleen: Normal size.  No focal lesions. Adrenals/Urinary Tract: The adrenal glands are unremarkable. The kidneys are quite small. Bilateral cysts are again noted. Stomach/Bowel: The stomach is well distended with contrast. No focal abnormalities. The duodenal bulb and C-loop appear normal. The small bowel and colon are grossly normal. No findings for leaking oral contrast to suggest a bowel injury. No obstructive  findings. Diffuse colonic diverticulosis without findings for acute diverticulitis. Vascular/Lymphatic: Stable atherosclerotic calcifications involving the aorta and branch vessels. No mesenteric or retroperitoneal mass or adenopathy. Reproductive: The uterus is surgically absent. Both ovaries are still present and appear normal. Other: Surgical changes involving the anterior abdominal wall. No hematoma or abscess. Musculoskeletal: No significant bony findings. IMPRESSION: 1. Small to moderate of fluid in the abdomen pelvis with some layering blood in the lower pelvis. This is probably normal postoperative change. Could not exclude the possibility of a bile leak. Nuclear medicine hepatobiliary scan may be helpful if patient does not improve. 2. No findings for bowel injury or bowel obstruction. 3. Small bilateral pleural effusions and bibasilar atelectasis. Electronically Signed   By: Marijo Sanes M.D.   On: 08/26/2017 17:48   Ct Head Wo Contrast  Result Date: 08/26/2017 CLINICAL DATA:  Altered mental status post cholecystectomy, history end-stage renal disease, breast cancer, hypertension EXAM: CT HEAD WITHOUT CONTRAST TECHNIQUE: Contiguous axial images were obtained from the base of the skull through the vertex without intravenous contrast. Sagittal and coronal MPR images reconstructed from axial data set. COMPARISON:  09/13/2015 FINDINGS: Brain: Generalized atrophy. Normal ventricular morphology. No midline shift or mass effect. Small vessel chronic ischemic changes of deep cerebral white matter. No intracranial hemorrhage, mass lesion, evidence of acute infarction, or extra-axial fluid collection. Vascular: Atherosclerotic calcifications of internal carotid arteries at skull base. No hyperdense vessels. Skull: Intact Sinuses/Orbits: Clear Other: N/A IMPRESSION: Atrophy with small vessel chronic ischemic changes of deep cerebral white matter. No acute intracranial abnormalities. Electronically Signed   By:  Lavonia Dana M.D.   On: 08/26/2017 17:31   Nm Hepatobiliary Liver Func  Result Date: 08/27/2017 CLINICAL DATA:  Cholecystectomy. Persistent abdominal pain and nausea. EXAM: NUCLEAR MEDICINE HEPATOBILIARY IMAGING TECHNIQUE: Sequential images of the abdomen were obtained out to 60 minutes following intravenous administration of radiopharmaceutical. RADIOPHARMACEUTICALS:  5.15 mCi Tc-33m Choletec IV COMPARISON:  CT 08/26/2017. FINDINGS: Liver appears normal. Pulling of radiopharmaceutical noted about the medial aspect of the liver and within previous identified peroneal fluid. These findings suggest bile leak. IMPRESSION: Findings suggesting bile leak. Electronically Signed   By: TMarcello Moores Register   On: 08/27/2017 16:06   Dg Abd Portable 1v  Result Date: 08/27/2017 CLINICAL DATA:  Evaluate gastric tube placement EXAM: PORTABLE ABDOMEN - 1 VIEW COMPARISON:  None. FINDINGS: The bowel gas pattern is normal. The tip and side port of a gastric tube are seen in the body of the stomach. Enteric contrast is noted within small and large bowel loops without obstruction. Heart is enlarged with bibasilar streaky parenchymal opacities compatible with atelectasis or scarring. Surgical clips are seen in the right upper quadrant of the abdomen and also projecting over the left breast. No acute osseous abnormality. IMPRESSION: 1. Gastric tube noted in the body of the stomach. 2. Nonobstructed, nondistended bowel gas pattern contrast noted within. 3. Cardiomegaly with bibasilar atelectasis and/or scarring. Electronically Signed   By: DShanon Brow  Randel Pigg M.D.   On: 08/27/2017 23:36   Dg Abd Portable 1v  Result Date: 08/27/2017 CLINICAL DATA:  Postop from laparoscopic cholecystectomy. Persistent vomiting. EXAM: PORTABLE ABDOMEN - 1 VIEW COMPARISON:  CT on 08/26/2017 FINDINGS: Oral contrast from recent CT is seen throughout the colon which is nondilated. No evidence of dilated small bowel loops. Right upper quadrant surgical clips are seen  from cholecystectomy. IMPRESSION: Normal bowel gas pattern. Electronically Signed   By: Earle Gell M.D.   On: 08/27/2017 09:13    Anti-infectives: Anti-infectives (From admission, onward)   Start     Dose/Rate Route Frequency Ordered Stop   08/23/2017 2200  piperacillin-tazobactam (ZOSYN) IVPB 3.375 g    Note to Pharmacy:  Zosyn 3.375 g IV q12h in ESRD on HD   3.375 g 12.5 mL/hr over 240 Minutes Intravenous Every 12 hours 08/22/2017 1155 08/25/17 1335   08/22/17 1000  piperacillin-tazobactam (ZOSYN) IVPB 3.375 g  Status:  Discontinued    Note to Pharmacy:  Zosyn 3.375 g IV q12h in ESRD on HD   3.375 g 12.5 mL/hr over 240 Minutes Intravenous Every 12 hours 08/22/17 0128 09/13/2017 1155   08/22/17 0045  piperacillin-tazobactam (ZOSYN) IVPB 3.375 g     3.375 g 12.5 mL/hr over 240 Minutes Intravenous  Once 08/22/17 0038 08/22/17 0145       Assessment/Plan HTN ESRD - HD MWF H/o breast cancer  Acute cholecystitis  POD 4 s/p lap chole 09/16/2017 by Dr. Redmond Pulling - some issues with pain, nausea/vomiting post-operatively - CT scan 4/8 w/ small amount fluid around liver and layering in pelvis ; HIDA 4/9 positive for bile leak - Total bilirubin increased to 2.5 from 2.4 yesterday, Alk Phos 177 from 176 - NG tube ordered and placed but pt pulled out NG tube this AM - [facility], Ron Parker, consulted for possible ERCP/stenting of post-operative bile leak.  ID -zosyn 4/4>>4/7, leukocytosis 11,800 from 12,700 yesterday FEN -NPO, IVF, NGT to LIWS VTE -SCDs, heparin Foley -none Follow up -[facility]  Plan- NPO, GI consult Will review CT abd with MD and confirm that there is no role for percutaneous drain at this time.     LOS: 6 days    Jill Alexanders , [facility] 08/28/2017, 8:57 AM Pager: [phone] Consults: [phone] Mon-Fri 7:00 am-4:30 pm Sat-Sun 7:00 am-11:30 am

## 2017-08-28 NOTE — Progress Notes (Signed)
Pt Ng tune was inserted 08/27/17 at 2245 and pulled out this morning.Her son was in the room and he didn't know what time.Pt refused to reinsert back NG tube Back.[name]

## 2017-08-28 NOTE — Progress Notes (Signed)
CRITICAL VALUE ALERT  Critical Value: CBG 60  Date & Time Notied:  08/28/17 0033 CBG repeat 109  Provider Notified:yes Via Amion  Orders Received/Actions taken:

## 2017-08-28 NOTE — Progress Notes (Signed)
Hypoglycemic Event  CBG: 71  Treatment: dextrose 25mg   Symptoms: none  Follow-up CBG: Time:2355 CBG Result:199  Possible Reasons for Event: poor po intake NPO  Comments/MD notified:no    Owens Shark, Hector Shade

## 2017-08-28 NOTE — Progress Notes (Addendum)
[facility] Progress Note   Subjective:   States she is feeling better much better today. No c/os.   Objective Vitals:   08/27/17 0609 08/27/17 1017 08/27/17 2210 08/28/17 0442  BP: (!) 119/50 (!) 133/100 112/67 (!) 127/57  Pulse: 93 95 (!) 101 (!) 104  Resp:  12 16   Temp: 97.9 F (36.6 C) 98.4 F (36.9 C) 98.1 F (36.7 C) 97.8 F (36.6 C)  TempSrc: Oral Oral Oral Oral  SpO2: 100% 100% 100% 100%  Weight:       Physical Exam General:NAD, WDWN female sitting in bedside chair Heart:tachycardia, RR, 2/6 systolic murmur. No rub or gallop Lungs:CTAB  Abdomen:soft, NTND Extremities:no edema b/l Dialysis Access: LU AVG +b/t   Filed Weights   08/23/17 1051 08/23/2017 0559 08/26/17 0700  Weight: 60.5 kg (133 lb 6.1 oz) 50.9 kg (112 lb 3.4 oz) 65.4 kg (144 lb 2.9 oz)    Intake/Output Summary (Last 24 hours) at 08/28/2017 1002 Last data filed at 08/28/2017 0936 Gross per 24 hour  Intake 0 ml  Output 650 ml  Net -650 ml    Additional Objective Labs: Basic Metabolic Panel: Recent Labs  Lab 08/23/17 0700 08/23/17 1438  08/26/17 0732 08/27/17 0921 08/28/17 0522  NA 132* 136   < > 131* 132* 133*  K 3.6 4.1   < > 4.8 3.8 3.7  CL 90* 94*   < > 89* 90* 87*  CO2 27 28   < > 23 28 26   GLUCOSE 69 90   < > 91 79 147*  BUN 32* 11   < > 62* 27* 38*  CREATININE 7.62* 3.63*   < > 8.19* 4.54* 6.08*  CALCIUM 8.2* 8.2*   < > 8.4* 8.7* 8.7*  PHOS 5.9* 3.0  --   --   --   --    < > = values in this interval not displayed.   Liver Function Tests: Recent Labs  Lab 08/26/17 0732 08/27/17 0921 08/28/17 0522  AST 35 45* 28  ALT 40 36 28  ALKPHOS 147* 176* 177*  BILITOT 1.9* 2.4* 2.5*  PROT 6.6 6.5 5.8*  ALBUMIN 2.2* 2.2* 1.8*   Recent Labs  Lab 09/03/2017 1618  LIPASE 36   CBC: Recent Labs  Lab 08/20/2017 1618  09/06/2017 0546 08/25/17 0720 08/26/17 0732 08/27/17 0921 08/28/17 0522  WBC 11.8*   < > 9.1 11.1* 8.3 12.7* 11.8*  NEUTROABS 10.9*  --   --   --    --   --   --   HGB 10.7*   < > 8.3* 9.9* 8.0* 8.1* 8.2*  HCT 33.5*   < > 25.0* 29.7* 24.3* 25.3* 24.5*  MCV 93.1   < > 91.2 90.5 91.0 90.7 88.8  PLT 216   < > 167 287 274 320 357   < > = values in this interval not displayed.   Blood Culture    Component Value Date/Time   SDES BLOOD RIGHT ARM 04/10/2010 1215   SPECREQUEST BOTTLES DRAWN AEROBIC ONLY 5CC 04/10/2010 1215   CULT NO GROWTH 5 DAYS 04/10/2010 1215   REPTSTATUS 04/16/2010 FINAL 04/10/2010 1215    CBG: Recent Labs  Lab 08/27/17 1701 08/28/17 0033 08/28/17 0149 08/28/17 0438 08/28/17 0651  GLUCAP 57* 60* 109* 66 99    Lab Results  Component Value Date   INR 1.34 04/10/2010   INR 1.13 04/03/2010   INR 1.31 04/01/2010   Studies/Results: Ct Abdomen Pelvis Wo Contrast  Result Date: 08/26/2017 CLINICAL DATA:  Altered mental status and abdominal pain. Status post cholecystectomy 09/01/2017 EXAM: CT ABDOMEN AND PELVIS WITHOUT CONTRAST TECHNIQUE: Multidetector CT imaging of the abdomen and pelvis was performed following the standard protocol without IV contrast. COMPARISON:  CT scan 09/07/2017 FINDINGS: Lower chest: There are small bilateral pleural effusions and bibasilar atelectasis, right greater than left. The heart is mildly enlarged but stable. Stable coronary artery calcifications. Oral contrast is noted in a slightly dilated esophagus suggesting reflux. Hepatobiliary: No focal hepatic lesions or intrahepatic biliary dilatation. Surgical clips noted from recent cholecystectomy. There is some type of surgical packing (Surgicel) in the gallbladder fossa. Moderate fluid surrounding the liver along with free intraperitoneal air, not unexpected. Small amount of fluid in the pericolic gutters and small to moderate amount of layering fluid and blood in the pelvis. No common bile duct dilatation. Pancreas: No mass, inflammation or ductal dilatation. Moderate pancreatic atrophy. Spleen: Normal size.  No focal lesions. Adrenals/Urinary  Tract: The adrenal glands are unremarkable. The kidneys are quite small. Bilateral cysts are again noted. Stomach/Bowel: The stomach is well distended with contrast. No focal abnormalities. The duodenal bulb and C-loop appear normal. The small bowel and colon are grossly normal. No findings for leaking oral contrast to suggest a bowel injury. No obstructive findings. Diffuse colonic diverticulosis without findings for acute diverticulitis. Vascular/Lymphatic: Stable atherosclerotic calcifications involving the aorta and branch vessels. No mesenteric or retroperitoneal mass or adenopathy. Reproductive: The uterus is surgically absent. Both ovaries are still present and appear normal. Other: Surgical changes involving the anterior abdominal wall. No hematoma or abscess. Musculoskeletal: No significant bony findings. IMPRESSION: 1. Small to moderate of fluid in the abdomen pelvis with some layering blood in the lower pelvis. This is probably normal postoperative change. Could not exclude the possibility of a bile leak. Nuclear medicine hepatobiliary scan may be helpful if patient does not improve. 2. No findings for bowel injury or bowel obstruction. 3. Small bilateral pleural effusions and bibasilar atelectasis. Electronically Signed   By: Marijo Sanes M.D.   On: 08/26/2017 17:48   Ct Head Wo Contrast  Result Date: 08/26/2017 CLINICAL DATA:  Altered mental status post cholecystectomy, history end-stage renal disease, breast cancer, hypertension EXAM: CT HEAD WITHOUT CONTRAST TECHNIQUE: Contiguous axial images were obtained from the base of the skull through the vertex without intravenous contrast. Sagittal and coronal MPR images reconstructed from axial data set. COMPARISON:  09/13/2015 FINDINGS: Brain: Generalized atrophy. Normal ventricular morphology. No midline shift or mass effect. Small vessel chronic ischemic changes of deep cerebral white matter. No intracranial hemorrhage, mass lesion, evidence of acute  infarction, or extra-axial fluid collection. Vascular: Atherosclerotic calcifications of internal carotid arteries at skull base. No hyperdense vessels. Skull: Intact Sinuses/Orbits: Clear Other: N/A IMPRESSION: Atrophy with small vessel chronic ischemic changes of deep cerebral white matter. No acute intracranial abnormalities. Electronically Signed   By: Lavonia Dana M.D.   On: 08/26/2017 17:31   Nm Hepatobiliary Liver Func  Result Date: 08/27/2017 CLINICAL DATA:  Cholecystectomy. Persistent abdominal pain and nausea. EXAM: NUCLEAR MEDICINE HEPATOBILIARY IMAGING TECHNIQUE: Sequential images of the abdomen were obtained out to 60 minutes following intravenous administration of radiopharmaceutical. RADIOPHARMACEUTICALS:  5.15 mCi Tc-55m  Choletec IV COMPARISON:  CT 08/26/2017. FINDINGS: Liver appears normal. Pulling of radiopharmaceutical noted about the medial aspect of the liver and within previous identified peroneal fluid. These findings suggest bile leak. IMPRESSION: Findings suggesting bile leak. Electronically Signed   By: Marcello Moores  Register   On:  08/27/2017 16:06   Dg Abd Portable 1v  Result Date: 08/27/2017 CLINICAL DATA:  Evaluate gastric tube placement EXAM: PORTABLE ABDOMEN - 1 VIEW COMPARISON:  None. FINDINGS: The bowel gas pattern is normal. The tip and side port of a gastric tube are seen in the body of the stomach. Enteric contrast is noted within small and large bowel loops without obstruction. Heart is enlarged with bibasilar streaky parenchymal opacities compatible with atelectasis or scarring. Surgical clips are seen in the right upper quadrant of the abdomen and also projecting over the left breast. No acute osseous abnormality. IMPRESSION: 1. Gastric tube noted in the body of the stomach. 2. Nonobstructed, nondistended bowel gas pattern contrast noted within. 3. Cardiomegaly with bibasilar atelectasis and/or scarring. Electronically Signed   By: Ashley Royalty M.D.   On: 08/27/2017 23:36   Dg  Abd Portable 1v  Result Date: 08/27/2017 CLINICAL DATA:  Postop from laparoscopic cholecystectomy. Persistent vomiting. EXAM: PORTABLE ABDOMEN - 1 VIEW COMPARISON:  CT on 08/26/2017 FINDINGS: Oral contrast from recent CT is seen throughout the colon which is nondilated. No evidence of dilated small bowel loops. Right upper quadrant surgical clips are seen from cholecystectomy. IMPRESSION: Normal bowel gas pattern. Electronically Signed   By: Earle Gell M.D.   On: 08/27/2017 09:13    Medications:  . cloNIDine  0.1 mg Transdermal Q Wed  . dextrose      . docusate sodium  100 mg Oral BID  . doxercalciferol  2 mcg Intravenous Q M,W,F-HD  . haloperidol lactate  2 mg Intravenous Once  . heparin injection (subcutaneous)  5,000 Units Subcutaneous Q8H  . sodium chloride flush  10-40 mL Intracatheter Q12H    Dialysis Orders: MWF [facility] 3h 61min 400/A1.5 61.5kg 2/2.25 bath P4 AVG LUE Hep 4000 - Hectoral 9mcg IV q HD - Mircera 27mcg IV q 2 weeks (last 3/27)   Assessment/Plan: 1. Acute cholecystitis - s/p lap chole 4/6 Dr. Redmond Pulling. Post op pain, N/V overnight, suspected post op ileus, KUB looks ok, per surgery.  Narcotics d/c. CT scan showed fluid in abd, possible bile duck leak identified on HIDA scan, GI consulting, per surgery/primary 2. ESRD - tolerating HD. Continue regular MWF schedule while admitted. K 3.7- due later today  3. Anemia of CKD- Hgb 8.2. Aranesp 150mcg given 4/8. Follow trend. 4. Secondary hyperparathyroidism - Ca ok. Holding binders while patient not eating. Continue VDRA.  5. HTN/volume - BP mostly low.  On clonidine patch. Does not appear volume overloaded. Titrate down volume as tolerated. Weights all over the place 6. Nutrition - Alb 1.8. Renal diet with fluid restrictions once advanced.    Jen Mow, PA-C [facility] Pager: [phone] 08/28/2017,10:02 AM  LOS: 6 days   Patient seen and examined, agree with above note with above  modifications. Looking better every day..GI consulted to look at CT and comment.  Routine HD later today  Corliss Parish, MD 08/28/2017

## 2017-08-28 NOTE — Progress Notes (Signed)
Hypoglycemic Event  CBG: 66  Treatment: D50%solution Symptoms: asymptomatic  Follow-up CBG: Time 0506 CBG Result:99  Possible Reasons for Event: NPO  Comments/MD notified amion    Pam Fuller [name]

## 2017-08-28 NOTE — Consult Note (Signed)
Chief Complaint: Patient was seen in consultation today for bile duct leak  Referring Physician(s): Dr. Rosendo Gros  Supervising Physician: Markus Daft  Patient Status: [facility] - In-pt  History of Present Illness: Pam Fuller is a 84 y.o. female with past medical history of ESRD on dialysis, HTN who underwent recent cholecystectomy 09/12/2017.  Patient subsequently developed worsening abdominal pain and AMS.   CT Abdomen Pelvis 08/26/17 showed: 1. Small to moderate of fluid in the abdomen pelvis with some layering blood in the lower pelvis. This is probably normal postoperative change. Could not exclude the possibility of a bile leak. Nuclear medicine hepatobiliary scan may be helpful if patient does not improve. 2. No findings for bowel injury or bowel obstruction. 3. Small bilateral pleural effusions and bibasilar atelectasis.  HIDA scan 4/9 showed: Liver appears normal. Pulling of radiopharmaceutical noted about the medial aspect of the liver and within previous identified peroneal fluid. These findings suggest bile leak.  IR consulted for possible biloma drain.  Case reviewed and approved by Dr. Laurence Ferrari.   Past Medical History:  Diagnosis Date  . Arthritis   . Blood transfusion without reported diagnosis   . Cancer [facility])    patient states she had br ca 13 years ago  . ESRD (end stage renal disease) on dialysis ([place])   . Hypertension   . Renal disorder    patient on dialysis  . Wears glasses     Past Surgical History:  Procedure Laterality Date  . ABDOMINAL HYSTERECTOMY    . APPENDECTOMY    . BREAST LUMPECTOMY WITH AXILLARY LYMPH NODE BIOPSY  2001   left  . BREAST SURGERY    . CHOLECYSTECTOMY N/A 08/22/2017   Procedure: LAPAROSCOPIC CHOLECYSTECTOMY;  Surgeon: [name], MD;  Location: [place];  Service: General;  Laterality: N/A;  . COLONOSCOPY    . PARTIAL MASTECTOMY WITH NEEDLE LOCALIZATION Left 06/18/2013   Procedure: PARTIAL MASTECTOMY WITH NEEDLE  LOCALIZATION;  Surgeon: Adin Hector, MD;  Location: [place];  Service: General;  Laterality: Left;  . TONSILLECTOMY      Allergies: Erythromycin and Vicodin [hydrocodone-acetaminophen]  Medications: Prior to Admission medications   Medication Sig Start Date End Date Taking? Authorizing Provider  amLODipine (NORVASC) 10 MG tablet Take 10 mg by mouth every evening.  03/31/13  Yes [provider]  calcium acetate (PHOSLO) 667 MG capsule Take 2 capsules (1,334 mg total) by mouth 3 (three) times daily with meals. 09/15/15  Yes Dana Allan I, MD  cloNIDine (CATAPRES) 0.1 MG tablet Take 0.1 mg by mouth every evening.  03/24/13  Yes [provider]  fluticasone (FLONASE) 50 MCG/ACT nasal spray Place 2 sprays into both nostrils daily. 09/15/15  Yes Dana Allan I, MD  hydrALAZINE (APRESOLINE) 50 MG tablet Take 50 mg by mouth every evening.  04/14/13  Yes [provider]  multivitamin (RENA-VIT) TABS tablet Take 1 tablet by mouth at bedtime. 09/15/15  Yes Bonnell Public, MD  tamoxifen (NOLVADEX) 20 MG tablet Take 1 tablet (20 mg total) by mouth daily. Patient taking differently: Take 20 mg by mouth every evening.  02/12/17  Yes Causey, Charlestine Massed, NP  hydrOXYzine (ATARAX/VISTARIL) 25 MG tablet Take 1 tablet (25 mg total) by mouth every 8 (eight) hours as needed for itching. Patient not taking: Reported on 08/22/2017 09/15/15   Bonnell Public, MD     Family History  Problem Relation Age of Onset  . Cancer Maternal Aunt  breast    Social History   Socioeconomic History  . Marital status: Widowed    Spouse name: Not on file  . Number of children: Not on file  . Years of education: Not on file  . Highest education level: Not on file  Occupational History  . Not on file  Social Needs  . Financial resource strain: Not on file  . Food insecurity:    Worry: Not on file    Inability: Not on file  . Transportation  needs:    Medical: Not on file    Non-medical: Not on file  Tobacco Use  . Smoking status: Never Smoker  . Smokeless tobacco: Never Used  Substance and Sexual Activity  . Alcohol use: No  . Drug use: No  . Sexual activity: Not on file  Lifestyle  . Physical activity:    Days per week: Not on file    Minutes per session: Not on file  . Stress: Not on file  Relationships  . Social connections:    Talks on phone: Not on file    Gets together: Not on file    Attends religious service: Not on file    Active member of club or organization: Not on file    Attends meetings of clubs or organizations: Not on file    Relationship status: Not on file  Other Topics Concern  . Not on file  Social History Narrative  . Not on file     Review of Systems: A 12 point ROS discussed and pertinent positives are indicated in the HPI above.  All other systems are negative.  Review of Systems  Constitutional: Negative for fatigue and fever.  Respiratory: Negative for cough and shortness of breath.   Cardiovascular: Negative for chest pain.  Gastrointestinal: Positive for abdominal pain.  Psychiatric/Behavioral: Positive for confusion (intermittent, improving). Negative for behavioral problems.    Vital Signs: BP (!) 110/54   Pulse (!) 101   Temp 98.4 F (36.9 C) (Oral)   Resp (!) 21   Wt 140 lb 14 oz (63.9 kg)   SpO2 96%   BMI 25.77 kg/m   Physical Exam  Constitutional: She is oriented to person, place, and time. She appears well-developed.  Cardiovascular: Normal rate, regular rhythm and normal heart sounds.  Pulmonary/Chest: Effort normal and breath sounds normal. No respiratory distress.  Abdominal: Soft. Normal appearance. She exhibits no distension. There is tenderness.  Neurological: She is alert and oriented to person, place, and time.  Skin: Skin is warm and dry.  Psychiatric: She has a normal mood and affect. Her behavior is normal.  Nursing note and vitals  reviewed.        Imaging: Ct Abdomen Pelvis Wo Contrast  Result Date: 08/26/2017 CLINICAL DATA:  Altered mental status and abdominal pain. Status post cholecystectomy 09/07/2017 EXAM: CT ABDOMEN AND PELVIS WITHOUT CONTRAST TECHNIQUE: Multidetector CT imaging of the abdomen and pelvis was performed following the standard protocol without IV contrast. COMPARISON:  CT scan 09/05/2017 FINDINGS: Lower chest: There are small bilateral pleural effusions and bibasilar atelectasis, right greater than left. The heart is mildly enlarged but stable. Stable coronary artery calcifications. Oral contrast is noted in a slightly dilated esophagus suggesting reflux. Hepatobiliary: No focal hepatic lesions or intrahepatic biliary dilatation. Surgical clips noted from recent cholecystectomy. There is some type of surgical packing (Surgicel) in the gallbladder fossa. Moderate fluid surrounding the liver along with free intraperitoneal air, not unexpected. Small amount of fluid in the pericolic  gutters and small to moderate amount of layering fluid and blood in the pelvis. No common bile duct dilatation. Pancreas: No mass, inflammation or ductal dilatation. Moderate pancreatic atrophy. Spleen: Normal size.  No focal lesions. Adrenals/Urinary Tract: The adrenal glands are unremarkable. The kidneys are quite small. Bilateral cysts are again noted. Stomach/Bowel: The stomach is well distended with contrast. No focal abnormalities. The duodenal bulb and C-loop appear normal. The small bowel and colon are grossly normal. No findings for leaking oral contrast to suggest a bowel injury. No obstructive findings. Diffuse colonic diverticulosis without findings for acute diverticulitis. Vascular/Lymphatic: Stable atherosclerotic calcifications involving the aorta and branch vessels. No mesenteric or retroperitoneal mass or adenopathy. Reproductive: The uterus is surgically absent. Both ovaries are still present and appear normal. Other:  Surgical changes involving the anterior abdominal wall. No hematoma or abscess. Musculoskeletal: No significant bony findings. IMPRESSION: 1. Small to moderate of fluid in the abdomen pelvis with some layering blood in the lower pelvis. This is probably normal postoperative change. Could not exclude the possibility of a bile leak. Nuclear medicine hepatobiliary scan may be helpful if patient does not improve. 2. No findings for bowel injury or bowel obstruction. 3. Small bilateral pleural effusions and bibasilar atelectasis. Electronically Signed   By: Marijo Sanes M.D.   On: 08/26/2017 17:48   Ct Abdomen Pelvis Wo Contrast  Result Date: 08/22/2017 CLINICAL DATA:  Right lower quadrant pain. EXAM: CT ABDOMEN AND PELVIS WITHOUT CONTRAST TECHNIQUE: Multidetector CT imaging of the abdomen and pelvis was performed following the standard protocol without IV contrast. COMPARISON:  Lumbar spine x-rays dated September 13, 2015. CT abdomen pelvis dated March 31, 2010. FINDINGS: Lower chest: No acute abnormality. Hepatobiliary: No focal liver abnormality. The gallbladder is distended and contains multiple small gallstones, one of which is seen in the gallbladder neck. Mild gallbladder wall thickening with surrounding pericholecystic fluid. Mild dilatation of the common bile duct, measuring up to 10 mm. Pancreas: Mild atrophy. No ductal dilatation or surrounding inflammatory changes. Spleen: Normal in size without focal abnormality. Adrenals/Urinary Tract: Adrenal glands are unremarkable. Severe atrophy of the bilateral kidneys. No renal or ureteral calculi. No hydronephrosis. The bladder is unremarkable. Stomach/Bowel: Small hiatal hernia. The stomach is distended with oral contrast. No evidence of bowel wall thickening, distention, or surrounding inflammatory changes. Extensive colonic diverticulosis. Vascular/Lymphatic: Aortic atherosclerosis. No enlarged abdominal or pelvic lymph nodes. Reproductive: Status post  hysterectomy. No adnexal masses. Other: No abdominal wall hernia or abnormality. Trace free fluid in the pelvis. No pneumoperitoneum. Musculoskeletal: Mild superior endplate compression deformities of L1 and L2 appear chronic but are new when compared to prior study. IMPRESSION: 1. Prominently distended gallbladder with a gallstone lodged in the gallbladder neck. Mild gallbladder wall thickening and surrounding pericholecystic fluid. Findings are concerning for acute cholecystitis. 2. Mild dilatation of the common bile duct, measuring up to 10 mm. No obstructing mass or stones seen. 3.  Aortic atherosclerosis (ICD10-I70.0). Electronically Signed   By: Titus Dubin M.D.   On: 08/22/2017 00:03   Ct Head Wo Contrast  Result Date: 08/26/2017 CLINICAL DATA:  Altered mental status post cholecystectomy, history end-stage renal disease, breast cancer, hypertension EXAM: CT HEAD WITHOUT CONTRAST TECHNIQUE: Contiguous axial images were obtained from the base of the skull through the vertex without intravenous contrast. Sagittal and coronal MPR images reconstructed from axial data set. COMPARISON:  09/13/2015 FINDINGS: Brain: Generalized atrophy. Normal ventricular morphology. No midline shift or mass effect. Small vessel chronic ischemic changes of deep cerebral  white matter. No intracranial hemorrhage, mass lesion, evidence of acute infarction, or extra-axial fluid collection. Vascular: Atherosclerotic calcifications of internal carotid arteries at skull base. No hyperdense vessels. Skull: Intact Sinuses/Orbits: Clear Other: N/A IMPRESSION: Atrophy with small vessel chronic ischemic changes of deep cerebral white matter. No acute intracranial abnormalities. Electronically Signed   By: Lavonia Dana M.D.   On: 08/26/2017 17:31   Nm Hepatobiliary Liver Func  Result Date: 08/27/2017 CLINICAL DATA:  Cholecystectomy. Persistent abdominal pain and nausea. EXAM: NUCLEAR MEDICINE HEPATOBILIARY IMAGING TECHNIQUE: Sequential  images of the abdomen were obtained out to 60 minutes following intravenous administration of radiopharmaceutical. RADIOPHARMACEUTICALS:  5.15 mCi Tc-53m  Choletec IV COMPARISON:  CT 08/26/2017. FINDINGS: Liver appears normal. Pulling of radiopharmaceutical noted about the medial aspect of the liver and within previous identified peroneal fluid. These findings suggest bile leak. IMPRESSION: Findings suggesting bile leak. Electronically Signed   By: Marcello Moores  Register   On: 08/27/2017 16:06   Dg Abd Portable 1v  Result Date: 08/27/2017 CLINICAL DATA:  Evaluate gastric tube placement EXAM: PORTABLE ABDOMEN - 1 VIEW COMPARISON:  None. FINDINGS: The bowel gas pattern is normal. The tip and side port of a gastric tube are seen in the body of the stomach. Enteric contrast is noted within small and large bowel loops without obstruction. Heart is enlarged with bibasilar streaky parenchymal opacities compatible with atelectasis or scarring. Surgical clips are seen in the right upper quadrant of the abdomen and also projecting over the left breast. No acute osseous abnormality. IMPRESSION: 1. Gastric tube noted in the body of the stomach. 2. Nonobstructed, nondistended bowel gas pattern contrast noted within. 3. Cardiomegaly with bibasilar atelectasis and/or scarring. Electronically Signed   By: Ashley Royalty M.D.   On: 08/27/2017 23:36   Dg Abd Portable 1v  Result Date: 08/27/2017 CLINICAL DATA:  Postop from laparoscopic cholecystectomy. Persistent vomiting. EXAM: PORTABLE ABDOMEN - 1 VIEW COMPARISON:  CT on 08/26/2017 FINDINGS: Oral contrast from recent CT is seen throughout the colon which is nondilated. No evidence of dilated small bowel loops. Right upper quadrant surgical clips are seen from cholecystectomy. IMPRESSION: Normal bowel gas pattern. Electronically Signed   By: Earle Gell M.D.   On: 08/27/2017 09:13    Labs:  CBC: Recent Labs    08/25/17 0720 08/26/17 0732 08/27/17 0921 08/28/17 0522  WBC  11.1* 8.3 12.7* 11.8*  HGB 9.9* 8.0* 8.1* 8.2*  HCT 29.7* 24.3* 25.3* 24.5*  PLT 287 274 320 357    COAGS: Recent Labs    08/28/17 1258  INR 1.53    BMP: Recent Labs    08/25/17 0720 08/26/17 0732 08/27/17 0921 08/28/17 0522  NA 135 131* 132* 133*  K 5.0 4.8 3.8 3.7  CL 92* 89* 90* 87*  CO2 22 23 28 26   GLUCOSE 96 91 79 147*  BUN 44* 62* 27* 38*  CALCIUM 9.2 8.4* 8.7* 8.7*  CREATININE 6.78* 8.19* 4.54* 6.08*  GFRNONAA 5* 4* 8* 6*  GFRAA 6* 5* 9* 7*    LIVER FUNCTION TESTS: Recent Labs    08/25/17 0720 08/26/17 0732 08/27/17 0921 08/28/17 0522  BILITOT 2.0* 1.9* 2.4* 2.5*  AST 60* 35 45* 28  ALT 57* 40 36 28  ALKPHOS 135* 147* 176* 177*  PROT 7.0 6.6 6.5 5.8*  ALBUMIN 2.6* 2.2* 2.2* 1.8*    TUMOR MARKERS: No results for input(s): AFPTM, CEA, CA199, CHROMGRNA in the last 8760 hours.  Assessment and Plan: Bile leak s/p lap cholecystectomy 08/28/2017 Patient  with HIDA suggestive of bile leak and fluid collection around the liver.  She is planning to undergo ERCP/stent placement tomorrow with GI.  IR consulted for biloma drain placement.  Discussed with son over the phone who would like to discuss case further with surgery prior to providing consent.   Will make patient NPO after midnight and coordinate whether biloma drain can be placed tomorrow with placed for ERCP.  IR to follow.   Thank you for this interesting consult.  I greatly enjoyed meeting Pam Fuller and look forward to participating in their care.  A copy of this report was sent to the requesting provider on this date.  Electronically Signed: Docia Barrier, PA 08/28/2017, 5:45 PM   I spent a total of 40 Minutes    in face to face in clinical consultation, greater than 50% of which was counseling/coordinating care for bile leak.

## 2017-08-28 NOTE — H&P (View-Only) (Signed)
Referring Provider: [facility] Surgery Primary Care Physician:  Patient, No Pcp Per - Maybe Dr. Karlton Lemon Primary Gastroenterologist: Althia Forts  Reason for Consultation:    Bile duct leak   ASSESSMENT AND PLAN:    45. 84 yo female with a bile leak post cholecystectomy 4 days ago. Spoke with Surgery who doesn't feel she needs a drain at this point.  -patient has dialysis today. Will plan for ERCP with stent placement tomorrow. The risks and benefits of ERCP were explained to the patient and her cousins in the room. Questions answered.  -I stopped [place] heparin after tonight.  -diet today per Surgery, NPO after MN  Addendum 5:00pm - I spoke with son Laraine Samet about ERCP and explained procedure including potential risk of bleeding if sphincterotomy done. Less likely complication of pancreatitis, pancreatitis, death. ERCP is planned for tomorrow. Son said he had spoken with Radiology about perc drain placement today. I don't see a note from IR yet but it sounds like from talking to son that perc drain is being planned  2. ESRD, on HD and dialyzes today  HPI: Pam Fuller is a 84 y.o. female with ESRD on HD and also a hx of breast cancer in 2001 and 2014. She is a few years out from last definitive breast surgery. Patient is POD# lap cholecystectomy.  She has been having nausea and vomiting, denies significant abdominal pain but family in room and shakes head yes that she has been having it in fact.  CT scan of the abdomen and pelvis shows some layering fluid in the abdomen, possible blood in the pelvis.  HIDA scan suggestive of bile duct leak. Alk phos 177, tbili 2.5, normal transaminases.   Patient denies any chronic GI problems. No bowel changes, blood in stool, or unexplained weight loss.     Past Medical History:  Diagnosis Date  . Arthritis   . Blood transfusion without reported diagnosis   . Cancer [facility])    patient states she had br ca 13 years ago  . ESRD (end stage  renal disease) on dialysis ([place])   . Hypertension   . Renal disorder    patient on dialysis  . Wears glasses     Past Surgical History:  Procedure Laterality Date  . ABDOMINAL HYSTERECTOMY    . APPENDECTOMY    . BREAST LUMPECTOMY WITH AXILLARY LYMPH NODE BIOPSY  2001   left  . BREAST SURGERY    . CHOLECYSTECTOMY N/A 09/14/2017   Procedure: LAPAROSCOPIC CHOLECYSTECTOMY;  Surgeon: [name], MD;  Location: [place];  Service: General;  Laterality: N/A;  . COLONOSCOPY    . PARTIAL MASTECTOMY WITH NEEDLE LOCALIZATION Left 06/18/2013   Procedure: PARTIAL MASTECTOMY WITH NEEDLE LOCALIZATION;  Surgeon: Adin Hector, MD;  Location: [place];  Service: General;  Laterality: Left;  . TONSILLECTOMY      Prior to Admission medications   Medication Sig Start Date End Date Taking? Authorizing Provider  amLODipine (NORVASC) 10 MG tablet Take 10 mg by mouth every evening.  03/31/13  Yes [provider]  calcium acetate (PHOSLO) 667 MG capsule Take 2 capsules (1,334 mg total) by mouth 3 (three) times daily with meals. 09/15/15  Yes Dana Allan I, MD  cloNIDine (CATAPRES) 0.1 MG tablet Take 0.1 mg by mouth every evening.  03/24/13  Yes [provider]  fluticasone (FLONASE) 50 MCG/ACT nasal spray Place 2 sprays into both nostrils daily. 09/15/15  Yes Bonnell Public, MD  hydrALAZINE (  APRESOLINE) 50 MG tablet Take 50 mg by mouth every evening.  04/14/13  Yes [provider]  multivitamin (RENA-VIT) TABS tablet Take 1 tablet by mouth at bedtime. 09/15/15  Yes Bonnell Public, MD  tamoxifen (NOLVADEX) 20 MG tablet Take 1 tablet (20 mg total) by mouth daily. Patient taking differently: Take 20 mg by mouth every evening.  02/12/17  Yes Causey, Charlestine Massed, NP  hydrOXYzine (ATARAX/VISTARIL) 25 MG tablet Take 1 tablet (25 mg total) by mouth every 8 (eight) hours as needed for itching. Patient not taking: Reported on 08/22/2017 09/15/15   Bonnell Public, MD    Current Facility-Administered Medications  Medication Dose Route Frequency Provider Last Rate Last Dose  . acetaminophen (TYLENOL) tablet 650 mg  650 mg Oral Q6H PRN Saverio Danker, PA-C   650 mg at 08/27/17 1648   Or  . acetaminophen (TYLENOL) suppository 650 mg  650 mg Rectal Q6H PRN Saverio Danker, PA-C      . cloNIDine (CATAPRES - Dosed in mg/24 hr) patch 0.1 mg  0.1 mg Transdermal Q Caralee Ates, MD   0.1 mg at 08/22/17 0335  . dextrose 50 % solution           . docusate sodium (COLACE) capsule 100 mg  100 mg Oral BID [name], MD   100 mg at 08/27/17 2052  . doxercalciferol (HECTOROL) injection 2 mcg  2 mcg Intravenous Q M,W,F-HD Corliss Parish, MD   2 mcg at 08/26/17 [phone]  . glucagon (human recombinant) (GLUCAGEN) injection 1 mg  1 mg Intramuscular Once PRN Jani Gravel, MD      . haloperidol lactate (HALDOL) injection 2 mg  2 mg Intravenous Once Blount, Scarlette Shorts T, NP      . heparin injection 5,000 Units  5,000 Units Subcutaneous Delsa Bern, MD   5,000 Units at 08/28/17 2355  . hydrALAZINE (APRESOLINE) injection 10 mg  10 mg Intravenous Q4H PRN [name], MD   10 mg at 08/26/17 7322  . ondansetron (ZOFRAN) tablet 4 mg  4 mg Oral Q6H PRN [name], MD       Or  . ondansetron [facility]) injection 4 mg  4 mg Intravenous Q6H PRN [name], MD   4 mg at 08/27/17 0335  . polyethylene glycol (MIRALAX / GLYCOLAX) packet 17 g  17 g Oral Daily PRN Jani Gravel, MD      . promethazine (PHENERGAN) injection 6.25 mg  6.25 mg Intravenous Q6H PRN Jani Gravel, MD   6.25 mg at 08/27/17 1606  . sodium chloride flush (NS) 0.9 % injection 10-40 mL  10-40 mL Intracatheter Q12H Jani Gravel, MD      . sodium chloride flush (NS) 0.9 % injection 10-40 mL  10-40 mL Intracatheter PRN Jani Gravel, MD      . traMADol Veatrice Bourbon) tablet 50-100 mg  50-100 mg Oral Q12H PRN [name], MD   50 mg at 08/27/17 1648    Allergies as of 09/14/2017 - Review Complete 08/25/2017    Allergen Reaction Noted  . Erythromycin Nausea And Vomiting 01/24/2011  . Vicodin [hydrocodone-acetaminophen]  07/02/2013    Family History  Problem Relation Age of Onset  . Cancer Maternal Aunt        breast    Social History   Socioeconomic History  . Marital status: Widowed    Spouse name: Not on file  . Number of children: Not on file  . Years of education: Not on file  . Highest  education level: Not on file  Occupational History  . Not on file  Social Needs  . Financial resource strain: Not on file  . Food insecurity:    Worry: Not on file    Inability: Not on file  . Transportation needs:    Medical: Not on file    Non-medical: Not on file  Tobacco Use  . Smoking status: Never Smoker  . Smokeless tobacco: Never Used  Substance and Sexual Activity  . Alcohol use: No  . Drug use: No  . Sexual activity: Not on file  Lifestyle  . Physical activity:    Days per week: Not on file    Minutes per session: Not on file  . Stress: Not on file  Relationships  . Social connections:    Talks on phone: Not on file    Gets together: Not on file    Attends religious service: Not on file    Active member of club or organization: Not on file    Attends meetings of clubs or organizations: Not on file    Relationship status: Not on file  . Intimate partner violence:    Fear of current or ex partner: Not on file    Emotionally abused: Not on file    Physically abused: Not on file    Forced sexual activity: Not on file  Other Topics Concern  . Not on file  Social History Narrative  . Not on file    Review of Systems: All systems reviewed and negative except where noted in HPI.  Physical Exam: Vital signs in last 24 hours: Temp:  [97.8 F (36.6 C)-98.4 F (36.9 C)] 97.8 F (36.6 C) (04/10 0442) Pulse Rate:  [95-104] 104 (04/10 0442) Resp:  [12-16] 16 (04/09 2210) BP: (112-133)/(57-100) 127/57 (04/10 0442) SpO2:  [100 %] 100 % (04/10 0442) Last BM Date:  08/22/17 General:   Alert, well-developed female in NAD Psych:  Pleasant, cooperative. Normal mood and affect. Eyes:  Pupils equal, sclera clear, no icterus.   Conjunctiva pink. Ears:  Normal auditory acuity. Nose:  No deformity, discharge,  or lesions. Neck:  Supple; no masses Lungs:  Clear throughout to auscultation.   No wheezes, crackles, or rhonchi.  Heart:  Regular rate and rhythm; + murmur, no edema Abdomen:  Soft, significant RUQ tenderness, a few BS.    Rectal:  Deferred  Msk:  Symmetrical without gross deformities. . Pulses:  Normal pulses noted. Neurologic:  Alert and  oriented x4;  grossly normal neurologically. Skin:  Intact without significant lesions or rashes..   Intake/Output from previous day: 04/09 0701 - 04/10 0700 In: 0  Out: 650 [Emesis/NG output:650] Intake/Output this shift: No intake/output data recorded.  Lab Results: Recent Labs    08/26/17 0732 08/27/17 0921 08/28/17 0522  WBC 8.3 12.7* 11.8*  HGB 8.0* 8.1* 8.2*  HCT 24.3* 25.3* 24.5*  PLT 274 320 357   BMET Recent Labs    08/26/17 0732 08/27/17 0921 08/28/17 0522  NA 131* 132* 133*  K 4.8 3.8 3.7  CL 89* 90* 87*  CO2 23 28 26   GLUCOSE 91 79 147*  BUN 62* 27* 38*  CREATININE 8.19* 4.54* 6.08*  CALCIUM 8.4* 8.7* 8.7*   LFT Recent Labs    08/28/17 0522  PROT 5.8*  ALBUMIN 1.8*  AST 28  ALT 28  ALKPHOS 177*  BILITOT 2.5*    Studies/Results: Ct Abdomen Pelvis Wo Contrast  Result Date: 08/26/2017 CLINICAL DATA:  Altered mental status and  abdominal pain. Status post cholecystectomy 08/31/2017 EXAM: CT ABDOMEN AND PELVIS WITHOUT CONTRAST TECHNIQUE: Multidetector CT imaging of the abdomen and pelvis was performed following the standard protocol without IV contrast. COMPARISON:  CT scan 09/17/2017 FINDINGS: Lower chest: There are small bilateral pleural effusions and bibasilar atelectasis, right greater than left. The heart is mildly enlarged but stable. Stable coronary artery  calcifications. Oral contrast is noted in a slightly dilated esophagus suggesting reflux. Hepatobiliary: No focal hepatic lesions or intrahepatic biliary dilatation. Surgical clips noted from recent cholecystectomy. There is some type of surgical packing (Surgicel) in the gallbladder fossa. Moderate fluid surrounding the liver along with free intraperitoneal air, not unexpected. Small amount of fluid in the pericolic gutters and small to moderate amount of layering fluid and blood in the pelvis. No common bile duct dilatation. Pancreas: No mass, inflammation or ductal dilatation. Moderate pancreatic atrophy. Spleen: Normal size.  No focal lesions. Adrenals/Urinary Tract: The adrenal glands are unremarkable. The kidneys are quite small. Bilateral cysts are again noted. Stomach/Bowel: The stomach is well distended with contrast. No focal abnormalities. The duodenal bulb and C-loop appear normal. The small bowel and colon are grossly normal. No findings for leaking oral contrast to suggest a bowel injury. No obstructive findings. Diffuse colonic diverticulosis without findings for acute diverticulitis. Vascular/Lymphatic: Stable atherosclerotic calcifications involving the aorta and branch vessels. No mesenteric or retroperitoneal mass or adenopathy. Reproductive: The uterus is surgically absent. Both ovaries are still present and appear normal. Other: Surgical changes involving the anterior abdominal wall. No hematoma or abscess. Musculoskeletal: No significant bony findings. IMPRESSION: 1. Small to moderate of fluid in the abdomen pelvis with some layering blood in the lower pelvis. This is probably normal postoperative change. Could not exclude the possibility of a bile leak. Nuclear medicine hepatobiliary scan may be helpful if patient does not improve. 2. No findings for bowel injury or bowel obstruction. 3. Small bilateral pleural effusions and bibasilar atelectasis. Electronically Signed   By: Marijo Sanes M.D.    On: 08/26/2017 17:48   Nm Hepatobiliary Liver Func  Result Date: 08/27/2017 CLINICAL DATA:  Cholecystectomy. Persistent abdominal pain and nausea. EXAM: NUCLEAR MEDICINE HEPATOBILIARY IMAGING TECHNIQUE: Sequential images of the abdomen were obtained out to 60 minutes following intravenous administration of radiopharmaceutical. RADIOPHARMACEUTICALS:  5.15 mCi Tc-27m Choletec IV COMPARISON:  CT 08/26/2017. FINDINGS: Liver appears normal. Pulling of radiopharmaceutical noted about the medial aspect of the liver and within previous identified peroneal fluid. These findings suggest bile leak. IMPRESSION: Findings suggesting bile leak. Electronically Signed   By: TMarcello Moores Register   On: 08/27/2017 16:06   PTye Savoy NP-C @  08/28/2017, 9:23 AM  Pager number [phone]  Attending physician's note   I have taken an interval history, reviewed the chart and examined the patient. I agree with the Advanced Practitioner's note, impression and recommendations.   84year old end-stage renal disease on hemodialysis and multiple medical problems with bile leak post cholecystectomy 4 days ago with biloma and possibly bile peritinitis. Plan to proceed with ERCP with biliary stenting tomorrow. May need drain by IR.  RCarmell Austria MD

## 2017-08-28 NOTE — Consult Note (Addendum)
Referring Provider: [facility] Surgery Primary Care Physician:  Patient, No Pcp Per - Maybe Dr. Karlton Lemon Primary Gastroenterologist: Althia Forts  Reason for Consultation:    Bile duct leak   ASSESSMENT AND PLAN:    62. 84 yo female with a bile leak post cholecystectomy 4 days ago. Spoke with Surgery who doesn't feel she needs a drain at this point.  -patient has dialysis today. Will plan for ERCP with stent placement tomorrow. The risks and benefits of ERCP were explained to the patient and her cousins in the room. Questions answered.  -I stopped [place] heparin after tonight.  -diet today per Surgery, NPO after MN  Addendum 5:00pm - I spoke with son Pam Fuller about ERCP and explained procedure including potential risk of bleeding if sphincterotomy done. Less likely complication of pancreatitis, pancreatitis, death. ERCP is planned for tomorrow. Son said he had spoken with Radiology about perc drain placement today. I don't see a note from IR yet but it sounds like from talking to son that perc drain is being planned  2. ESRD, on HD and dialyzes today  HPI: Pam Fuller is a 84 y.o. female with ESRD on HD and also a hx of breast cancer in 2001 and 2014. She is a few years out from last definitive breast surgery. Patient is POD# lap cholecystectomy.  She has been having nausea and vomiting, denies significant abdominal pain but family in room and shakes head yes that she has been having it in fact.  CT scan of the abdomen and pelvis shows some layering fluid in the abdomen, possible blood in the pelvis.  HIDA scan suggestive of bile duct leak. Alk phos 177, tbili 2.5, normal transaminases.   Patient denies any chronic GI problems. No bowel changes, blood in stool, or unexplained weight loss.     Past Medical History:  Diagnosis Date  . Arthritis   . Blood transfusion without reported diagnosis   . Cancer [facility])    patient states she had br ca 13 years ago  . ESRD (end stage  renal disease) on dialysis ([place])   . Hypertension   . Renal disorder    patient on dialysis  . Wears glasses     Past Surgical History:  Procedure Laterality Date  . ABDOMINAL HYSTERECTOMY    . APPENDECTOMY    . BREAST LUMPECTOMY WITH AXILLARY LYMPH NODE BIOPSY  2001   left  . BREAST SURGERY    . CHOLECYSTECTOMY N/A 09/02/2017   Procedure: LAPAROSCOPIC CHOLECYSTECTOMY;  Surgeon: [name], MD;  Location: [place];  Service: General;  Laterality: N/A;  . COLONOSCOPY    . PARTIAL MASTECTOMY WITH NEEDLE LOCALIZATION Left 06/18/2013   Procedure: PARTIAL MASTECTOMY WITH NEEDLE LOCALIZATION;  Surgeon: Adin Hector, MD;  Location: [place];  Service: General;  Laterality: Left;  . TONSILLECTOMY      Prior to Admission medications   Medication Sig Start Date End Date Taking? Authorizing Provider  amLODipine (NORVASC) 10 MG tablet Take 10 mg by mouth every evening.  03/31/13  Yes [provider]  calcium acetate (PHOSLO) 667 MG capsule Take 2 capsules (1,334 mg total) by mouth 3 (three) times daily with meals. 09/15/15  Yes Dana Allan I, MD  cloNIDine (CATAPRES) 0.1 MG tablet Take 0.1 mg by mouth every evening.  03/24/13  Yes [provider]  fluticasone (FLONASE) 50 MCG/ACT nasal spray Place 2 sprays into both nostrils daily. 09/15/15  Yes Bonnell Public, MD  hydrALAZINE (  APRESOLINE) 50 MG tablet Take 50 mg by mouth every evening.  04/14/13  Yes [provider]  multivitamin (RENA-VIT) TABS tablet Take 1 tablet by mouth at bedtime. 09/15/15  Yes Bonnell Public, MD  tamoxifen (NOLVADEX) 20 MG tablet Take 1 tablet (20 mg total) by mouth daily. Patient taking differently: Take 20 mg by mouth every evening.  02/12/17  Yes Causey, Charlestine Massed, NP  hydrOXYzine (ATARAX/VISTARIL) 25 MG tablet Take 1 tablet (25 mg total) by mouth every 8 (eight) hours as needed for itching. Patient not taking: Reported on 08/22/2017 09/15/15   Bonnell Public, MD    Current Facility-Administered Medications  Medication Dose Route Frequency Provider Last Rate Last Dose  . acetaminophen (TYLENOL) tablet 650 mg  650 mg Oral Q6H PRN Saverio Danker, PA-C   650 mg at 08/27/17 1648   Or  . acetaminophen (TYLENOL) suppository 650 mg  650 mg Rectal Q6H PRN Saverio Danker, PA-C      . cloNIDine (CATAPRES - Dosed in mg/24 hr) patch 0.1 mg  0.1 mg Transdermal Q Caralee Ates, MD   0.1 mg at 08/22/17 0335  . dextrose 50 % solution           . docusate sodium (COLACE) capsule 100 mg  100 mg Oral BID [name], MD   100 mg at 08/27/17 2052  . doxercalciferol (HECTOROL) injection 2 mcg  2 mcg Intravenous Q M,W,F-HD Corliss Parish, MD   2 mcg at 08/26/17 [phone]  . glucagon (human recombinant) (GLUCAGEN) injection 1 mg  1 mg Intramuscular Once PRN Jani Gravel, MD      . haloperidol lactate (HALDOL) injection 2 mg  2 mg Intravenous Once Blount, Scarlette Shorts T, NP      . heparin injection 5,000 Units  5,000 Units Subcutaneous Delsa Bern, MD   5,000 Units at 08/28/17 8502  . hydrALAZINE (APRESOLINE) injection 10 mg  10 mg Intravenous Q4H PRN [name], MD   10 mg at 08/26/17 7741  . ondansetron (ZOFRAN) tablet 4 mg  4 mg Oral Q6H PRN [name], MD       Or  . ondansetron [facility]) injection 4 mg  4 mg Intravenous Q6H PRN [name], MD   4 mg at 08/27/17 0335  . polyethylene glycol (MIRALAX / GLYCOLAX) packet 17 g  17 g Oral Daily PRN Jani Gravel, MD      . promethazine (PHENERGAN) injection 6.25 mg  6.25 mg Intravenous Q6H PRN Jani Gravel, MD   6.25 mg at 08/27/17 1606  . sodium chloride flush (NS) 0.9 % injection 10-40 mL  10-40 mL Intracatheter Q12H Jani Gravel, MD      . sodium chloride flush (NS) 0.9 % injection 10-40 mL  10-40 mL Intracatheter PRN Jani Gravel, MD      . traMADol Veatrice Bourbon) tablet 50-100 mg  50-100 mg Oral Q12H PRN [name], MD   50 mg at 08/27/17 1648    Allergies as of 08/31/2017 - Review Complete 08/25/2017    Allergen Reaction Noted  . Erythromycin Nausea And Vomiting 01/24/2011  . Vicodin [hydrocodone-acetaminophen]  07/02/2013    Family History  Problem Relation Age of Onset  . Cancer Maternal Aunt        breast    Social History   Socioeconomic History  . Marital status: Widowed    Spouse name: Not on file  . Number of children: Not on file  . Years of education: Not on file  . Highest  education level: Not on file  Occupational History  . Not on file  Social Needs  . Financial resource strain: Not on file  . Food insecurity:    Worry: Not on file    Inability: Not on file  . Transportation needs:    Medical: Not on file    Non-medical: Not on file  Tobacco Use  . Smoking status: Never Smoker  . Smokeless tobacco: Never Used  Substance and Sexual Activity  . Alcohol use: No  . Drug use: No  . Sexual activity: Not on file  Lifestyle  . Physical activity:    Days per week: Not on file    Minutes per session: Not on file  . Stress: Not on file  Relationships  . Social connections:    Talks on phone: Not on file    Gets together: Not on file    Attends religious service: Not on file    Active member of club or organization: Not on file    Attends meetings of clubs or organizations: Not on file    Relationship status: Not on file  . Intimate partner violence:    Fear of current or ex partner: Not on file    Emotionally abused: Not on file    Physically abused: Not on file    Forced sexual activity: Not on file  Other Topics Concern  . Not on file  Social History Narrative  . Not on file    Review of Systems: All systems reviewed and negative except where noted in HPI.  Physical Exam: Vital signs in last 24 hours: Temp:  [97.8 F (36.6 C)-98.4 F (36.9 C)] 97.8 F (36.6 C) (04/10 0442) Pulse Rate:  [95-104] 104 (04/10 0442) Resp:  [12-16] 16 (04/09 2210) BP: (112-133)/(57-100) 127/57 (04/10 0442) SpO2:  [100 %] 100 % (04/10 0442) Last BM Date:  08/22/17 General:   Alert, well-developed female in NAD Psych:  Pleasant, cooperative. Normal mood and affect. Eyes:  Pupils equal, sclera clear, no icterus.   Conjunctiva pink. Ears:  Normal auditory acuity. Nose:  No deformity, discharge,  or lesions. Neck:  Supple; no masses Lungs:  Clear throughout to auscultation.   No wheezes, crackles, or rhonchi.  Heart:  Regular rate and rhythm; + murmur, no edema Abdomen:  Soft, significant RUQ tenderness, a few BS.    Rectal:  Deferred  Msk:  Symmetrical without gross deformities. . Pulses:  Normal pulses noted. Neurologic:  Alert and  oriented x4;  grossly normal neurologically. Skin:  Intact without significant lesions or rashes..   Intake/Output from previous day: 04/09 0701 - 04/10 0700 In: 0  Out: 650 [Emesis/NG output:650] Intake/Output this shift: No intake/output data recorded.  Lab Results: Recent Labs    08/26/17 0732 08/27/17 0921 08/28/17 0522  WBC 8.3 12.7* 11.8*  HGB 8.0* 8.1* 8.2*  HCT 24.3* 25.3* 24.5*  PLT 274 320 357   BMET Recent Labs    08/26/17 0732 08/27/17 0921 08/28/17 0522  NA 131* 132* 133*  K 4.8 3.8 3.7  CL 89* 90* 87*  CO2 23 28 26   GLUCOSE 91 79 147*  BUN 62* 27* 38*  CREATININE 8.19* 4.54* 6.08*  CALCIUM 8.4* 8.7* 8.7*   LFT Recent Labs    08/28/17 0522  PROT 5.8*  ALBUMIN 1.8*  AST 28  ALT 28  ALKPHOS 177*  BILITOT 2.5*    Studies/Results: Ct Abdomen Pelvis Wo Contrast  Result Date: 08/26/2017 CLINICAL DATA:  Altered mental status and  abdominal pain. Status post cholecystectomy 08/20/2017 EXAM: CT ABDOMEN AND PELVIS WITHOUT CONTRAST TECHNIQUE: Multidetector CT imaging of the abdomen and pelvis was performed following the standard protocol without IV contrast. COMPARISON:  CT scan 08/30/2017 FINDINGS: Lower chest: There are small bilateral pleural effusions and bibasilar atelectasis, right greater than left. The heart is mildly enlarged but stable. Stable coronary artery  calcifications. Oral contrast is noted in a slightly dilated esophagus suggesting reflux. Hepatobiliary: No focal hepatic lesions or intrahepatic biliary dilatation. Surgical clips noted from recent cholecystectomy. There is some type of surgical packing (Surgicel) in the gallbladder fossa. Moderate fluid surrounding the liver along with free intraperitoneal air, not unexpected. Small amount of fluid in the pericolic gutters and small to moderate amount of layering fluid and blood in the pelvis. No common bile duct dilatation. Pancreas: No mass, inflammation or ductal dilatation. Moderate pancreatic atrophy. Spleen: Normal size.  No focal lesions. Adrenals/Urinary Tract: The adrenal glands are unremarkable. The kidneys are quite small. Bilateral cysts are again noted. Stomach/Bowel: The stomach is well distended with contrast. No focal abnormalities. The duodenal bulb and C-loop appear normal. The small bowel and colon are grossly normal. No findings for leaking oral contrast to suggest a bowel injury. No obstructive findings. Diffuse colonic diverticulosis without findings for acute diverticulitis. Vascular/Lymphatic: Stable atherosclerotic calcifications involving the aorta and branch vessels. No mesenteric or retroperitoneal mass or adenopathy. Reproductive: The uterus is surgically absent. Both ovaries are still present and appear normal. Other: Surgical changes involving the anterior abdominal wall. No hematoma or abscess. Musculoskeletal: No significant bony findings. IMPRESSION: 1. Small to moderate of fluid in the abdomen pelvis with some layering blood in the lower pelvis. This is probably normal postoperative change. Could not exclude the possibility of a bile leak. Nuclear medicine hepatobiliary scan may be helpful if patient does not improve. 2. No findings for bowel injury or bowel obstruction. 3. Small bilateral pleural effusions and bibasilar atelectasis. Electronically Signed   By: Marijo Sanes M.D.    On: 08/26/2017 17:48   Nm Hepatobiliary Liver Func  Result Date: 08/27/2017 CLINICAL DATA:  Cholecystectomy. Persistent abdominal pain and nausea. EXAM: NUCLEAR MEDICINE HEPATOBILIARY IMAGING TECHNIQUE: Sequential images of the abdomen were obtained out to 60 minutes following intravenous administration of radiopharmaceutical. RADIOPHARMACEUTICALS:  5.15 mCi Tc-11m Choletec IV COMPARISON:  CT 08/26/2017. FINDINGS: Liver appears normal. Pulling of radiopharmaceutical noted about the medial aspect of the liver and within previous identified peroneal fluid. These findings suggest bile leak. IMPRESSION: Findings suggesting bile leak. Electronically Signed   By: TMarcello Moores Register   On: 08/27/2017 16:06   PTye Savoy NP-C @  08/28/2017, 9:23 AM  Pager number [phone]  Attending physician's note   I have taken an interval history, reviewed the chart and examined the patient. I agree with the Advanced Practitioner's note, impression and recommendations.   84year old end-stage renal disease on hemodialysis and multiple medical problems with bile leak post cholecystectomy 4 days ago with biloma and possibly bile peritinitis. Plan to proceed with ERCP with biliary stenting tomorrow. May need drain by IR.  RCarmell Austria MD

## 2017-08-28 NOTE — Progress Notes (Signed)
Patient ID: Pam Fuller, female   DOB: August 28, 1932, 84 y.o.   MRN: [id number]                                                                PROGRESS NOTE                                                                                                                                                                                                             Patient Demographics:    Pam Fuller, is a 84 y.o. female, DOB - 06-27-1932, FIE:[id number]  Admit date - 08/26/2017   Admitting Physician Rise Patience, MD  Outpatient Primary MD for the patient is Patient, No Pcp Per  LOS - 6  Outpatient Specialists:     Chief Complaint  Patient presents with  . Abdominal Pain       Brief Narrative     84 y.o.femalewithwith history of ESRD on hemodialysis Monday Wednesday and Friday, hypertension, anemia and history of breast cancer presents to the ER because of abdominal pain. Patient has been having right lower quadrant abdominal pain for last 2 days. Has been having nausea vomiting. Denies any diarrhea fever or chills. Denies any chest pain or shortness of breath. Had dialysis yesterday.  ED Course:In the ER CT abdomen pelvis shows features consistent with acute cholecystitis. On-call general surgeon was consulted. Patient was started on cholecystitis. Patient has still not decided if patient was to have surgery for drain. Okay with antibiotics. At the time of my exam patient has not already received pain medication and states her abdominal pain is improved.     Subjective:    Elizibeth Breau today states that her abdominal pain has improved.  HIDA suggested bile leak.  GI consult pending.   Afebrile.  Denies n/v, diarrhea, brbpr.    No headache, No chest pain, No new weakness tingling or numbness, No Cough - SOB.    Assessment  & Plan :    Principal Problem:   Acute cholecystitis Active Problems:   ESRD on dialysis [facility])   Essential hypertension   GERD  (gastroesophageal reflux disease)     1. Acute cholecystitis: Lap chole 4/6 Start miralax 17gm po qday prn constipation4/7 Painuncontrolled Change from morhpine sulfate to dilaudid 1mg  iv q3h prn CT scan abd /  pelvis: => slight fluid in lower abd, most likely result of post-operative change HIDA 4/9-=> suggestion of possible bile leak GI consulted by surgery, appreciate input Appreciate surgery input  2. AMS ? Due to opioids, try to minimize narcotics, seems improved and back to baseline this am CT brain 4/8 =>negative   3. ESRD: HD(M, W, F)Continue per Nephrology cmp in am  4. HTN: Controlled  5. Hx of Breast cancer: Stable. Follow up with oncology  6. Anemia of chronic dz: stable H/H. Watch for post-op anemia. Cbc in am      Code Status:FULL CODE  Family Communication :w family, son  Disposition Plan:home  Barriers For Discharge:  Consults :Surgery,nephrology  Procedures :   DVT Prophylaxis: - Heparin - SCDs        Lab Results  Component Value Date   PLT 357 08/28/2017    Antibiotics  :  Zosyn 4/4=> 4/6    Anti-infectives (From admission, onward)   Start     Dose/Rate Route Frequency Ordered Stop   09/04/2017 2200  piperacillin-tazobactam (ZOSYN) IVPB 3.375 g    Note to Pharmacy:  Zosyn 3.375 g IV q12h in ESRD on HD   3.375 g 12.5 mL/hr over 240 Minutes Intravenous Every 12 hours 08/26/2017 1155 08/25/17 1335   08/22/17 1000  piperacillin-tazobactam (ZOSYN) IVPB 3.375 g  Status:  Discontinued    Note to Pharmacy:  Zosyn 3.375 g IV q12h in ESRD on HD   3.375 g 12.5 mL/hr over 240 Minutes Intravenous Every 12 hours 08/22/17 0128 08/30/2017 1155   08/22/17 0045  piperacillin-tazobactam (ZOSYN) IVPB 3.375 g     3.375 g 12.5 mL/hr over 240 Minutes Intravenous  Once 08/22/17 0038 08/22/17 0145        Objective:   Vitals:   08/27/17 0609 08/27/17 1017 08/27/17 2210 08/28/17 0442  BP: (!) 119/50 (!)  133/100 112/67 (!) 127/57  Pulse: 93 95 (!) 101 (!) 104  Resp:  12 16   Temp: 97.9 F (36.6 C) 98.4 F (36.9 C) 98.1 F (36.7 C) 97.8 F (36.6 C)  TempSrc: Oral Oral Oral Oral  SpO2: 100% 100% 100% 100%  Weight:        Wt Readings from Last 3 Encounters:  08/26/17 65.4 kg (144 lb 2.9 oz)  07/04/17 64 kg (141 lb)  02/12/17 62.7 kg (138 lb 3.2 oz)     Intake/Output Summary (Last 24 hours) at 08/28/2017 0932 Last data filed at 08/28/2017 0449 Gross per 24 hour  Intake 0 ml  Output 650 ml  Net -650 ml     Physical Exam  Awake Alert, Oriented X 3, No new F.N deficits, Normal affect [place].AT,PERRAL Supple Neck,No JVD, No cervical lymphadenopathy appriciated.  Symmetrical Chest wall movement, Good air movement bilaterally, CTAB RRR,No Gallops,Rubs or new Murmurs, No Parasternal Heave +ve B.Sounds, Abd Soft, No tenderness, No organomegaly appriciated, No rebound - guarding or rigidity. No Cyanosis, Clubbing or edema, No new Rash or bruise     Data Review:    CBC Recent Labs  Lab 09/13/2017 1618  08/27/2017 0546 08/25/17 0720 08/26/17 0732 08/27/17 0921 08/28/17 0522  WBC 11.8*   < > 9.1 11.1* 8.3 12.7* 11.8*  HGB 10.7*   < > 8.3* 9.9* 8.0* 8.1* 8.2*  HCT 33.5*   < > 25.0* 29.7* 24.3* 25.3* 24.5*  PLT 216   < > 167 287 274 320 357  MCV 93.1   < > 91.2 90.5 91.0 90.7 88.8  MCH 29.7   < >  30.3 30.2 30.0 29.0 29.7  MCHC 31.9   < > 33.2 33.3 32.9 32.0 33.5  RDW 15.0   < > 15.4 14.9 15.4 15.3 15.4  LYMPHSABS 0.6*  --   --   --   --   --   --   MONOABS 0.2  --   --   --   --   --   --   EOSABS 0.0  --   --   --   --   --   --   BASOSABS 0.0  --   --   --   --   --   --    < > = values in this interval not displayed.    Chemistries  Recent Labs  Lab 09/08/2017 0546 08/25/17 0720 08/26/17 0732 08/27/17 0921 08/28/17 0522  NA 133* 135 131* 132* 133*  K 3.9 5.0 4.8 3.8 3.7  CL 91* 92* 89* 90* 87*  CO2 26 22 23 28 26   GLUCOSE 69 96 91 79 147*  BUN 21* 44* 62* 27* 38*    CREATININE 5.25* 6.78* 8.19* 4.54* 6.08*  CALCIUM 8.4* 9.2 8.4* 8.7* 8.7*  AST 66* 60* 35 45* 28  ALT 51 57* 40 36 28  ALKPHOS 134* 135* 147* 176* 177*  BILITOT 1.9* 2.0* 1.9* 2.4* 2.5*   ------------------------------------------------------------------------------------------------------------------ No results for input(s): CHOL, HDL, LDLCALC, TRIG, CHOLHDL, LDLDIRECT in the last 72 hours.  Lab Results  Component Value Date   HGBA1C  04/01/2010    5.4 (NOTE)                                                                       According to the ADA Clinical Practice Recommendations for 2011, when HbA1c is used as a screening test:   >=6.5%   Diagnostic of Diabetes Mellitus           (if abnormal result  is confirmed)  5.7-6.4%   Increased risk of developing Diabetes Mellitus  References:Diagnosis and Classification of Diabetes Mellitus,Diabetes Care,2011,34(Suppl 1):S62-S69 and Standards of Medical Care in         Diabetes - 2011,Diabetes XVQM,0867,61  (Suppl 1):S11-S61.   ------------------------------------------------------------------------------------------------------------------ No results for input(s): TSH, T4TOTAL, T3FREE, THYROIDAB in the last 72 hours.  Invalid input(s): FREET3 ------------------------------------------------------------------------------------------------------------------ No results for input(s): VITAMINB12, FOLATE, FERRITIN, TIBC, IRON, RETICCTPCT in the last 72 hours.  Coagulation profile No results for input(s): INR, PROTIME in the last 168 hours.  No results for input(s): DDIMER in the last 72 hours.  Cardiac Enzymes No results for input(s): CKMB, TROPONINI, MYOGLOBIN in the last 168 hours.  Invalid input(s): CK ------------------------------------------------------------------------------------------------------------------ No results found for: BNP  Inpatient Medications  Scheduled Meds: . cloNIDine  0.1 mg Transdermal Q Wed  . dextrose       . docusate sodium  100 mg Oral BID  . doxercalciferol  2 mcg Intravenous Q M,W,F-HD  . haloperidol lactate  2 mg Intravenous Once  . heparin injection (subcutaneous)  5,000 Units Subcutaneous Q8H  . sodium chloride flush  10-40 mL Intracatheter Q12H   Continuous Infusions: PRN Meds:.acetaminophen **OR** acetaminophen, glucagon (human recombinant), hydrALAZINE, ondansetron **OR** ondansetron (ZOFRAN) IV, polyethylene glycol, promethazine, sodium chloride flush, traMADol  Micro Results Recent Results (  from the past 240 hour(s))  Surgical pcr screen     Status: None   Collection Time: 08/22/17  4:03 AM  Result Value Ref Range Status   MRSA, PCR NEGATIVE NEGATIVE Final   Staphylococcus aureus NEGATIVE NEGATIVE Final    Comment: (NOTE) The Xpert SA Assay (FDA approved for NASAL specimens in patients 20 years of age and older), is one component of a comprehensive surveillance program. It is not intended to diagnose infection nor to guide or monitor treatment. Performed at [facility], [place] [address], [place], [name] [address]     Radiology Reports Ct Abdomen Pelvis Wo Contrast  Result Date: 08/26/2017 CLINICAL DATA:  Altered mental status and abdominal pain. Status post cholecystectomy 09/08/2017 EXAM: CT ABDOMEN AND PELVIS WITHOUT CONTRAST TECHNIQUE: Multidetector CT imaging of the abdomen and pelvis was performed following the standard protocol without IV contrast. COMPARISON:  CT scan 09/08/2017 FINDINGS: Lower chest: There are small bilateral pleural effusions and bibasilar atelectasis, right greater than left. The heart is mildly enlarged but stable. Stable coronary artery calcifications. Oral contrast is noted in a slightly dilated esophagus suggesting reflux. Hepatobiliary: No focal hepatic lesions or intrahepatic biliary dilatation. Surgical clips noted from recent cholecystectomy. There is some type of surgical packing (Surgicel) in the gallbladder fossa. Moderate fluid  surrounding the liver along with free intraperitoneal air, not unexpected. Small amount of fluid in the pericolic gutters and small to moderate amount of layering fluid and blood in the pelvis. No common bile duct dilatation. Pancreas: No mass, inflammation or ductal dilatation. Moderate pancreatic atrophy. Spleen: Normal size.  No focal lesions. Adrenals/Urinary Tract: The adrenal glands are unremarkable. The kidneys are quite small. Bilateral cysts are again noted. Stomach/Bowel: The stomach is well distended with contrast. No focal abnormalities. The duodenal bulb and C-loop appear normal. The small bowel and colon are grossly normal. No findings for leaking oral contrast to suggest a bowel injury. No obstructive findings. Diffuse colonic diverticulosis without findings for acute diverticulitis. Vascular/Lymphatic: Stable atherosclerotic calcifications involving the aorta and branch vessels. No mesenteric or retroperitoneal mass or adenopathy. Reproductive: The uterus is surgically absent. Both ovaries are still present and appear normal. Other: Surgical changes involving the anterior abdominal wall. No hematoma or abscess. Musculoskeletal: No significant bony findings. IMPRESSION: 1. Small to moderate of fluid in the abdomen pelvis with some layering blood in the lower pelvis. This is probably normal postoperative change. Could not exclude the possibility of a bile leak. Nuclear medicine hepatobiliary scan may be helpful if patient does not improve. 2. No findings for bowel injury or bowel obstruction. 3. Small bilateral pleural effusions and bibasilar atelectasis. Electronically Signed   By: Marijo Sanes M.D.   On: 08/26/2017 17:48   Ct Abdomen Pelvis Wo Contrast  Result Date: 08/22/2017 CLINICAL DATA:  Right lower quadrant pain. EXAM: CT ABDOMEN AND PELVIS WITHOUT CONTRAST TECHNIQUE: Multidetector CT imaging of the abdomen and pelvis was performed following the standard protocol without IV contrast.  COMPARISON:  Lumbar spine x-rays dated September 13, 2015. CT abdomen pelvis dated March 31, 2010. FINDINGS: Lower chest: No acute abnormality. Hepatobiliary: No focal liver abnormality. The gallbladder is distended and contains multiple small gallstones, one of which is seen in the gallbladder neck. Mild gallbladder wall thickening with surrounding pericholecystic fluid. Mild dilatation of the common bile duct, measuring up to 10 mm. Pancreas: Mild atrophy. No ductal dilatation or surrounding inflammatory changes. Spleen: Normal in size without focal abnormality. Adrenals/Urinary Tract: Adrenal glands are unremarkable.  Severe atrophy of the bilateral kidneys. No renal or ureteral calculi. No hydronephrosis. The bladder is unremarkable. Stomach/Bowel: Small hiatal hernia. The stomach is distended with oral contrast. No evidence of bowel wall thickening, distention, or surrounding inflammatory changes. Extensive colonic diverticulosis. Vascular/Lymphatic: Aortic atherosclerosis. No enlarged abdominal or pelvic lymph nodes. Reproductive: Status post hysterectomy. No adnexal masses. Other: No abdominal wall hernia or abnormality. Trace free fluid in the pelvis. No pneumoperitoneum. Musculoskeletal: Mild superior endplate compression deformities of L1 and L2 appear chronic but are new when compared to prior study. IMPRESSION: 1. Prominently distended gallbladder with a gallstone lodged in the gallbladder neck. Mild gallbladder wall thickening and surrounding pericholecystic fluid. Findings are concerning for acute cholecystitis. 2. Mild dilatation of the common bile duct, measuring up to 10 mm. No obstructing mass or stones seen. 3.  Aortic atherosclerosis (ICD10-I70.0). Electronically Signed   By: Titus Dubin M.D.   On: 08/22/2017 00:03   Ct Head Wo Contrast  Result Date: 08/26/2017 CLINICAL DATA:  Altered mental status post cholecystectomy, history end-stage renal disease, breast cancer, hypertension EXAM: CT  HEAD WITHOUT CONTRAST TECHNIQUE: Contiguous axial images were obtained from the base of the skull through the vertex without intravenous contrast. Sagittal and coronal MPR images reconstructed from axial data set. COMPARISON:  09/13/2015 FINDINGS: Brain: Generalized atrophy. Normal ventricular morphology. No midline shift or mass effect. Small vessel chronic ischemic changes of deep cerebral white matter. No intracranial hemorrhage, mass lesion, evidence of acute infarction, or extra-axial fluid collection. Vascular: Atherosclerotic calcifications of internal carotid arteries at skull base. No hyperdense vessels. Skull: Intact Sinuses/Orbits: Clear Other: N/A IMPRESSION: Atrophy with small vessel chronic ischemic changes of deep cerebral white matter. No acute intracranial abnormalities. Electronically Signed   By: Lavonia Dana M.D.   On: 08/26/2017 17:31   Nm Hepatobiliary Liver Func  Result Date: 08/27/2017 CLINICAL DATA:  Cholecystectomy. Persistent abdominal pain and nausea. EXAM: NUCLEAR MEDICINE HEPATOBILIARY IMAGING TECHNIQUE: Sequential images of the abdomen were obtained out to 60 minutes following intravenous administration of radiopharmaceutical. RADIOPHARMACEUTICALS:  5.15 mCi Tc-16m  Choletec IV COMPARISON:  CT 08/26/2017. FINDINGS: Liver appears normal. Pulling of radiopharmaceutical noted about the medial aspect of the liver and within previous identified peroneal fluid. These findings suggest bile leak. IMPRESSION: Findings suggesting bile leak. Electronically Signed   By: Marcello Moores  Register   On: 08/27/2017 16:06   Dg Abd Portable 1v  Result Date: 08/27/2017 CLINICAL DATA:  Evaluate gastric tube placement EXAM: PORTABLE ABDOMEN - 1 VIEW COMPARISON:  None. FINDINGS: The bowel gas pattern is normal. The tip and side port of a gastric tube are seen in the body of the stomach. Enteric contrast is noted within small and large bowel loops without obstruction. Heart is enlarged with bibasilar streaky  parenchymal opacities compatible with atelectasis or scarring. Surgical clips are seen in the right upper quadrant of the abdomen and also projecting over the left breast. No acute osseous abnormality. IMPRESSION: 1. Gastric tube noted in the body of the stomach. 2. Nonobstructed, nondistended bowel gas pattern contrast noted within. 3. Cardiomegaly with bibasilar atelectasis and/or scarring. Electronically Signed   By: Ashley Royalty M.D.   On: 08/27/2017 23:36   Dg Abd Portable 1v  Result Date: 08/27/2017 CLINICAL DATA:  Postop from laparoscopic cholecystectomy. Persistent vomiting. EXAM: PORTABLE ABDOMEN - 1 VIEW COMPARISON:  CT on 08/26/2017 FINDINGS: Oral contrast from recent CT is seen throughout the colon which is nondilated. No evidence of dilated small bowel loops. Right upper quadrant surgical clips  are seen from cholecystectomy. IMPRESSION: Normal bowel gas pattern. Electronically Signed   By: Earle Gell M.D.   On: 08/27/2017 09:13    Time Spent in minutes  30   Jani Gravel M.D on 08/28/2017 at 9:32 AM  Between 7am to 7pm - Pager - [phone]    After 7pm go to [email or web address] - password [facility]  [facility] -  Office  [phone]

## 2017-08-28 NOTE — Discharge Instructions (Signed)
please arrive at least 30 min before your appointment to complete your check in paperwork.  If you are unable to arrive 30 min prior to your appointment time we may have to cancel or reschedule you. ° °LAPAROSCOPIC SURGERY: POST OP INSTRUCTIONS  °1. DIET: Follow a light bland diet the first 24 hours after arrival home, such as soup, liquids, crackers, etc. Be sure to include lots of fluids daily. Avoid fast food or heavy meals as your are more likely to get nauseated. Eat a low fat the next few days after surgery.  °2. Take your usually prescribed home medications unless otherwise directed. °3. PAIN CONTROL:  °1. Pain is best controlled by a usual combination of three different methods TOGETHER:  °1. Ice/Heat °2. Over the counter pain medication °3. Prescription pain medication °2. Most patients will experience some swelling and bruising around the incisions. Ice packs or heating pads (30-60 minutes up to 6 times a day) will help. Use ice for the first few days to help decrease swelling and bruising, then switch to heat to help relax tight/sore spots and speed recovery. Some people prefer to use ice alone, heat alone, alternating between ice & heat. Experiment to what works for you. Swelling and bruising can take several weeks to resolve.  °3. It is helpful to take an over-the-counter pain medication regularly for the first few weeks. Choose one of the following that works best for you:  °1. Naproxen (Aleve, etc) Two 220mg tabs twice a day °2. Ibuprofen (Advil, etc) Three 200mg tabs four times a day (every meal & bedtime) °3. Acetaminophen (Tylenol, etc) 500-650mg four times a day (every meal & bedtime) °4. A prescription for pain medication (such as oxycodone, hydrocodone, etc) should be given to you upon discharge. Take your pain medication as prescribed.  °1. If you are having problems/concerns with the prescription medicine (does not control pain, nausea, vomiting, rash, itching, etc), please call us [phone]  [phone] to see if we need to switch you to a different pain medicine that will work better for you and/or control your side effect better. °2. If you need a refill on your pain medication, please contact your pharmacy. They will contact our office to request authorization. Prescriptions will not be filled after 5 pm or on week-ends. °4. Avoid getting constipated. Between the surgery and the pain medications, it is common to experience some constipation. Increasing fluid intake and taking a fiber supplement (such as Metamucil, Citrucel, FiberCon, MiraLax, etc) 1-2 times a day regularly will usually help prevent this problem from occurring. A mild laxative (prune juice, Milk of Magnesia, MiraLax, etc) should be taken according to package directions if there are no bowel movements after 48 hours.  °5. Watch out for diarrhea. If you have many loose bowel movements, simplify your diet to bland foods & liquids for a few days. Stop any stool softeners and decrease your fiber supplement. Switching to mild anti-diarrheal medications (Kayopectate, Pepto Bismol) can help. If this worsens or does not improve, please call us. °6. Wash / shower every day. You may shower over the dressings as they are waterproof. Continue to shower over incision(s) after the dressing is off. °7. Remove your waterproof bandages 5 days after surgery. You may leave the incision open to air. You may replace a dressing/Band-Aid to cover the incision for comfort if you wish.  °8. ACTIVITIES as tolerated:  °1. You may resume regular (light) daily activities beginning the next day--such as daily self-care, walking, climbing stairs--gradually   increasing activities as tolerated. If you can walk 30 minutes without difficulty, it is safe to try more intense activity such as jogging, treadmill, bicycling, low-impact aerobics, swimming, etc. °2. Save the most intensive and strenuous activity for last such as sit-ups, heavy lifting, contact sports, etc Refrain  from any heavy lifting or straining until you are off narcotics for pain control.  °3. DO NOT PUSH THROUGH PAIN. Let pain be your guide: If it hurts to do something, don't do it. Pain is your body warning you to avoid that activity for another week until the pain goes down. °4. You may drive when you are no longer taking prescription pain medication, you can comfortably wear a seatbelt, and you can safely maneuver your car and apply brakes. °5. You may have sexual intercourse when it is comfortable.  °9. FOLLOW UP in our office  °1. Please call [facility] at [phone] to set up an appointment to see your surgeon in the office for a follow-up appointment approximately 2-3 weeks after your surgery. °2. Make sure that you call for this appointment the day you arrive home to insure a convenient appointment time. °     10. IF YOU HAVE DISABILITY OR FAMILY LEAVE FORMS, BRING THEM TO THE               OFFICE FOR PROCESSING.  ° °WHEN TO CALL US [phone]:  °1. Poor pain control °2. Reactions / problems with new medications (rash/itching, nausea, etc)  °3. Fever over 101.5 F (38.5 C) °4. Inability to urinate °5. Nausea and/or vomiting °6. Worsening swelling or bruising °7. Continued bleeding from incision. °8. Increased pain, redness, or drainage from the incision ° °The clinic staff is available to answer your questions during regular business hours (8:30am-5pm). Please don’t hesitate to call and ask to speak to one of our nurses for clinical concerns.  °If you have a medical emergency, go to the nearest emergency room or call 911.  °A surgeon from [facility] is always on call at the hospitals  ° °[facility]  °[address], [address], [place], [place] [address] ?  °MAIN: [phone] ? TOLL FREE: [phone] ?  °FAX [phone]  °[email or web address] ° °

## 2017-08-29 ENCOUNTER — Inpatient Hospital Stay (HOSPITAL_COMMUNITY): Payer: Medicare Other | Admitting: Anesthesiology

## 2017-08-29 ENCOUNTER — Encounter (HOSPITAL_COMMUNITY): Payer: Self-pay

## 2017-08-29 ENCOUNTER — Encounter (HOSPITAL_COMMUNITY): Admission: EM | Disposition: E | Payer: Self-pay | Source: Home / Self Care | Attending: Emergency Medicine

## 2017-08-29 ENCOUNTER — Inpatient Hospital Stay (HOSPITAL_COMMUNITY): Payer: Medicare Other

## 2017-08-29 DIAGNOSIS — J9600 Acute respiratory failure, unspecified whether with hypoxia or hypercapnia: Secondary | ICD-10-CM

## 2017-08-29 DIAGNOSIS — J9601 Acute respiratory failure with hypoxia: Secondary | ICD-10-CM

## 2017-08-29 DIAGNOSIS — Z992 Dependence on renal dialysis: Secondary | ICD-10-CM

## 2017-08-29 DIAGNOSIS — N186 End stage renal disease: Secondary | ICD-10-CM

## 2017-08-29 DIAGNOSIS — K810 Acute cholecystitis: Secondary | ICD-10-CM

## 2017-08-29 DIAGNOSIS — T17908A Unspecified foreign body in respiratory tract, part unspecified causing other injury, initial encounter: Secondary | ICD-10-CM

## 2017-08-29 HISTORY — PX: IR US GUIDE BX ASP/DRAIN: IMG2392

## 2017-08-29 LAB — POCT I-STAT 3, ART BLOOD GAS (G3+)
Acid-Base Excess: 7 mmol/L — ABNORMAL HIGH (ref 0.0–2.0)
Bicarbonate: 32.7 mmol/L — ABNORMAL HIGH (ref 20.0–28.0)
O2 SAT: 100 %
PCO2 ART: 53.1 mmHg — AB (ref 32.0–48.0)
PO2 ART: 292 mmHg — AB (ref 83.0–108.0)
TCO2: 34 mmol/L — ABNORMAL HIGH (ref 22–32)
pH, Arterial: 7.398 (ref 7.350–7.450)

## 2017-08-29 LAB — BASIC METABOLIC PANEL
Anion gap: 14 (ref 5–15)
BUN: 20 mg/dL (ref 6–20)
CHLORIDE: 98 mmol/L — AB (ref 101–111)
CO2: 28 mmol/L (ref 22–32)
Calcium: 8.5 mg/dL — ABNORMAL LOW (ref 8.9–10.3)
Creatinine, Ser: 3.75 mg/dL — ABNORMAL HIGH (ref 0.44–1.00)
GFR calc Af Amer: 12 mL/min — ABNORMAL LOW (ref >60)
GFR calc non Af Amer: 10 mL/min — ABNORMAL LOW (ref >60)
Glucose, Bld: 94 mg/dL (ref 65–99)
POTASSIUM: 3.4 mmol/L — AB (ref 3.5–5.1)
SODIUM: 140 mmol/L (ref 135–145)

## 2017-08-29 LAB — COMPREHENSIVE METABOLIC PANEL
ALBUMIN: 2.5 g/dL — AB (ref 3.5–5.0)
ALT: 23 U/L (ref 14–54)
AST: 22 U/L (ref 15–41)
Alkaline Phosphatase: 197 U/L — ABNORMAL HIGH (ref 38–126)
Anion gap: 16 — ABNORMAL HIGH (ref 5–15)
BUN: 14 mg/dL (ref 6–20)
CALCIUM: 9 mg/dL (ref 8.9–10.3)
CO2: 29 mmol/L (ref 22–32)
CREATININE: 3.5 mg/dL — AB (ref 0.44–1.00)
Chloride: 94 mmol/L — ABNORMAL LOW (ref 101–111)
GFR calc Af Amer: 13 mL/min — ABNORMAL LOW (ref >60)
GFR, EST NON AFRICAN AMERICAN: 11 mL/min — AB (ref >60)
GLUCOSE: 94 mg/dL (ref 65–99)
POTASSIUM: 3.2 mmol/L — AB (ref 3.5–5.1)
SODIUM: 139 mmol/L (ref 135–145)
TOTAL PROTEIN: 6.6 g/dL (ref 6.5–8.1)
Total Bilirubin: 3.2 mg/dL — ABNORMAL HIGH (ref 0.3–1.2)

## 2017-08-29 LAB — CBC
HCT: 22.3 % — ABNORMAL LOW (ref 36.0–46.0)
HEMATOCRIT: 22.6 % — AB (ref 36.0–46.0)
HEMOGLOBIN: 7.6 g/dL — AB (ref 12.0–15.0)
Hemoglobin: 7.5 g/dL — ABNORMAL LOW (ref 12.0–15.0)
MCH: 29.4 pg (ref 26.0–34.0)
MCH: 29.8 pg (ref 26.0–34.0)
MCHC: 33.6 g/dL (ref 30.0–36.0)
MCHC: 33.6 g/dL (ref 30.0–36.0)
MCV: 87.5 fL (ref 78.0–100.0)
MCV: 88.6 fL (ref 78.0–100.0)
PLATELETS: 434 10*3/uL — AB (ref 150–400)
Platelets: 318 10*3/uL (ref 150–400)
RBC: 2.55 MIL/uL — ABNORMAL LOW (ref 3.87–5.11)
RBC: 2.55 MIL/uL — AB (ref 3.87–5.11)
RDW: 15.1 % (ref 11.5–15.5)
RDW: 15.2 % (ref 11.5–15.5)
WBC: 16.8 10*3/uL — AB (ref 4.0–10.5)
WBC: 9.3 10*3/uL (ref 4.0–10.5)

## 2017-08-29 LAB — TROPONIN I
TROPONIN I: 1.4 ng/mL — AB (ref <0.03)
Troponin I: 0.05 ng/mL (ref <0.03)

## 2017-08-29 LAB — GRAM STAIN

## 2017-08-29 LAB — GLUCOSE, CAPILLARY
GLUCOSE-CAPILLARY: 68 mg/dL (ref 65–99)
GLUCOSE-CAPILLARY: 82 mg/dL (ref 65–99)
Glucose-Capillary: 94 mg/dL (ref 65–99)
Glucose-Capillary: 93 mg/dL (ref 65–99)
Glucose-Capillary: 90 mg/dL (ref 65–99)
Glucose-Capillary: 94 mg/dL (ref 65–99)

## 2017-08-29 LAB — MRSA PCR SCREENING: MRSA BY PCR: NEGATIVE

## 2017-08-29 SURGERY — CANCELLED PROCEDURE
Anesthesia: General

## 2017-08-29 MED ORDER — LIDOCAINE HCL 1 % IJ SOLN
INTRAMUSCULAR | Status: AC
  Filled 2017-08-29: qty 20

## 2017-08-29 MED ORDER — PHENYLEPHRINE 40 MCG/ML (10ML) SYRINGE FOR IV PUSH (FOR BLOOD PRESSURE SUPPORT)
PREFILLED_SYRINGE | INTRAVENOUS | Status: DC | PRN
  Administered 2017-08-29: 120 ug via INTRAVENOUS
  Administered 2017-08-29: 160 ug via INTRAVENOUS

## 2017-08-29 MED ORDER — DOCUSATE SODIUM 50 MG/5ML PO LIQD: 100.0000 mg | Freq: Two times a day (BID) | ORAL | Status: DC | PRN

## 2017-08-29 MED ORDER — MIDAZOLAM HCL 2 MG/2ML IJ SOLN
1.0000 mg | INTRAMUSCULAR | Status: DC | PRN
  Administered 2017-08-29 – 2017-09-13 (×19): 1 mg via INTRAVENOUS
  Filled 2017-08-29 (×20): qty 2

## 2017-08-29 MED ORDER — SODIUM CHLORIDE 0.9 % IV SOLN: 3.0000 g | INTRAVENOUS | Status: DC

## 2017-08-29 MED ORDER — PROPOFOL 10 MG/ML IV BOLUS
INTRAVENOUS | Status: DC | PRN
  Administered 2017-08-29 (×2): 100 mg via INTRAVENOUS

## 2017-08-29 MED ORDER — FENTANYL CITRATE (PF) 100 MCG/2ML IJ SOLN
50.0000 ug | Freq: Once | INTRAMUSCULAR | Status: AC
  Administered 2017-08-29: 50 ug via INTRAVENOUS

## 2017-08-29 MED ORDER — ORAL CARE MOUTH RINSE
15.0000 mL | Freq: Four times a day (QID) | OROMUCOSAL | Status: DC
  Administered 2017-08-29 – 2017-08-30 (×3): 15 mL via OROMUCOSAL

## 2017-08-29 MED ORDER — SUCCINYLCHOLINE CHLORIDE 20 MG/ML IJ SOLN
INTRAMUSCULAR | Status: DC | PRN
  Administered 2017-08-29: 100 mg via INTRAVENOUS

## 2017-08-29 MED ORDER — HEPARIN BOLUS VIA INFUSION
3500.0000 [IU] | Freq: Once | INTRAVENOUS | Status: AC
  Administered 2017-08-29: 3500 [IU] via INTRAVENOUS
  Filled 2017-08-29: qty 3500

## 2017-08-29 MED ORDER — FENTANYL 2500MCG IN NS 250ML (10MCG/ML) PREMIX INFUSION
25.0000 ug/h | INTRAVENOUS | Status: DC
  Administered 2017-08-29: 50 ug/h via INTRAVENOUS
  Administered 2017-08-29: 200 ug/h via INTRAVENOUS
  Administered 2017-08-30: 250 ug/h via INTRAVENOUS
  Administered 2017-08-31: 100 ug/h via INTRAVENOUS
  Administered 2017-09-02: 75 ug/h via INTRAVENOUS
  Administered 2017-09-02: 50 ug/h via INTRAVENOUS
  Administered 2017-09-03: 75 ug/h via INTRAVENOUS
  Administered 2017-09-04: 150 ug/h via INTRAVENOUS
  Filled 2017-08-29 (×8): qty 250

## 2017-08-29 MED ORDER — ALBUMIN HUMAN 5 % IV SOLN
INTRAVENOUS | Status: AC
  Filled 2017-08-29: qty 250

## 2017-08-29 MED ORDER — PHENYLEPHRINE HCL 10 MG/ML IJ SOLN
INTRAVENOUS | Status: DC | PRN
  Administered 2017-08-29: 100 ug/min via INTRAVENOUS

## 2017-08-29 MED ORDER — CIPROFLOXACIN IN D5W 400 MG/200ML IV SOLN
INTRAVENOUS | Status: AC
Start: 2017-08-29 — End: 2017-08-29
  Filled 2017-08-29: qty 200

## 2017-08-29 MED ORDER — DEXTROSE 50 % IV SOLN
25.0000 mL | Freq: Once | INTRAVENOUS | Status: AC
  Administered 2017-08-29: 25 mL via INTRAVENOUS

## 2017-08-29 MED ORDER — HEPARIN (PORCINE) IN NACL 100-0.45 UNIT/ML-% IJ SOLN
700.0000 [IU]/h | INTRAMUSCULAR | Status: DC
  Administered 2017-08-29: 700 [IU]/h via INTRAVENOUS
  Filled 2017-08-29: qty 250

## 2017-08-29 MED ORDER — GLUCAGON HCL RDNA (DIAGNOSTIC) 1 MG IJ SOLR
INTRAMUSCULAR | Status: AC
  Filled 2017-08-29: qty 1

## 2017-08-29 MED ORDER — INDOMETHACIN 50 MG RE SUPP
100.0000 mg | Freq: Once | RECTAL | Status: DC
  Filled 2017-08-29: qty 2

## 2017-08-29 MED ORDER — PHENYLEPHRINE HCL 10 MG/ML IJ SOLN
0.0000 ug/min | INTRAMUSCULAR | Status: DC
  Administered 2017-08-29 (×2): 100 ug/min via INTRAVENOUS
  Administered 2017-08-29: 110 ug/min via INTRAVENOUS
  Administered 2017-08-29: 20 ug/min via INTRAVENOUS
  Administered 2017-08-30: 110 ug/min via INTRAVENOUS
  Administered 2017-08-30: 170 ug/min via INTRAVENOUS
  Administered 2017-08-30 (×2): 100 ug/min via INTRAVENOUS
  Administered 2017-08-30 (×2): 180 ug/min via INTRAVENOUS
  Administered 2017-08-30 (×2): 170 ug/min via INTRAVENOUS
  Administered 2017-08-30: 100 ug/min via INTRAVENOUS
  Filled 2017-08-29: qty 10
  Filled 2017-08-29: qty 1
  Filled 2017-08-29: qty 10
  Filled 2017-08-29 (×3): qty 1
  Filled 2017-08-29 (×2): qty 10

## 2017-08-29 MED ORDER — IOPAMIDOL (ISOVUE-300) INJECTION 61%
INTRAVENOUS | Status: AC
  Filled 2017-08-29: qty 50

## 2017-08-29 MED ORDER — SODIUM CHLORIDE 0.9 % IV SOLN
INTRAVENOUS | Status: DC
  Administered 2017-08-29: 08:00:00 via INTRAVENOUS

## 2017-08-29 MED ORDER — SODIUM CHLORIDE 0.9 % IV SOLN
3.0000 g | Freq: Once | INTRAVENOUS | Status: AC
  Administered 2017-08-29: 3 g via INTRAVENOUS
  Filled 2017-08-29: qty 3

## 2017-08-29 MED ORDER — ALBUMIN HUMAN 5 % IV SOLN
INTRAVENOUS | Status: DC | PRN
  Administered 2017-08-29: 09:00:00 via INTRAVENOUS

## 2017-08-29 MED ORDER — FENTANYL BOLUS VIA INFUSION
25.0000 ug | INTRAVENOUS | Status: DC | PRN
  Administered 2017-08-29 – 2017-09-02 (×7): 25 ug via INTRAVENOUS
  Filled 2017-08-29: qty 25

## 2017-08-29 MED ORDER — DEXTROSE 50 % IV SOLN
INTRAVENOUS | Status: AC
  Administered 2017-08-29: 25 mL via INTRAVENOUS
  Filled 2017-08-29: qty 50

## 2017-08-29 MED ORDER — LIDOCAINE 2% (20 MG/ML) 5 ML SYRINGE
INTRAMUSCULAR | Status: DC | PRN
  Administered 2017-08-29: 60 mg via INTRAVENOUS

## 2017-08-29 MED ORDER — CHLORHEXIDINE GLUCONATE 0.12% ORAL RINSE (MEDLINE KIT)
15.0000 mL | Freq: Two times a day (BID) | OROMUCOSAL | Status: DC
  Administered 2017-08-29 – 2017-09-17 (×38): 15 mL via OROMUCOSAL

## 2017-08-29 MED ORDER — MIDAZOLAM HCL 2 MG/2ML IJ SOLN
1.0000 mg | INTRAMUSCULAR | Status: AC | PRN
  Administered 2017-08-29 (×3): 1 mg via INTRAVENOUS
  Filled 2017-08-29: qty 2

## 2017-08-29 MED ORDER — INDOMETHACIN 50 MG RE SUPP
RECTAL | Status: AC
  Filled 2017-08-29: qty 2

## 2017-08-29 MED ORDER — PANTOPRAZOLE SODIUM 40 MG IV SOLR
40.0000 mg | Freq: Every day | INTRAVENOUS | Status: DC
  Administered 2017-08-29 – 2017-09-02 (×5): 40 mg via INTRAVENOUS
  Filled 2017-08-29 (×5): qty 40

## 2017-08-29 NOTE — Progress Notes (Signed)
[facility] Progress Note   Subjective:   Events noted- had HD late yest- removed 896- some low BPs- was supposed to be getting ERCP this AM- she aspirated before that so procedure aborted- she is now in ICU on vent- req transient pressors as well   Objective Vitals:   09/08/2017 1121 08/20/2017 1130 08/28/2017 1145 09/08/2017 1148  BP:  (!) 95/51 (!) 83/46 (!) 85/50  Pulse:      Resp:  18 18 18   Temp:      TempSrc:      SpO2: 100%     Weight:      Height:       Physical Exam General:sedated on vent Heart:tachycardia, RR, 2/6 systolic murmur. No rub or gallop Lungs:CTAB  Abdomen:soft, NTND Extremities:no edema b/l Dialysis Access: LU AVG +b/t   Filed Weights   08/28/17 1446 08/28/17 1840 09/03/2017 0732  Weight: 63.9 kg (140 lb 14 oz) 61.9 kg (136 lb 7.4 oz) 61.7 kg (136 lb)    Intake/Output Summary (Last 24 hours) at 09/16/2017 1157 Last data filed at 09/07/2017 1100 Gross per 24 hour  Intake 480 ml  Output 896 ml  Net -416 ml    Additional Objective Labs: Basic Metabolic Panel: Recent Labs  Lab 08/23/17 0700 08/23/17 1438  08/27/17 0921 08/28/17 0522 09/03/2017 0630  NA 132* 136   < > 132* 133* 139  K 3.6 4.1   < > 3.8 3.7 3.2*  CL 90* 94*   < > 90* 87* 94*  CO2 27 28   < > 28 26 29   GLUCOSE 69 90   < > 79 147* 94  BUN 32* 11   < > 27* 38* 14  CREATININE 7.62* 3.63*   < > 4.54* 6.08* 3.50*  CALCIUM 8.2* 8.2*   < > 8.7* 8.7* 9.0  PHOS 5.9* 3.0  --   --   --   --    < > = values in this interval not displayed.   Liver Function Tests: Recent Labs  Lab 08/27/17 0921 08/28/17 0522 09/08/2017 0630  AST 45* 28 22  ALT 36 28 23  ALKPHOS 176* 177* 197*  BILITOT 2.4* 2.5* 3.2*  PROT 6.5 5.8* 6.6  ALBUMIN 2.2* 1.8* 2.5*   No results for input(s): LIPASE, AMYLASE in the last 168 hours. CBC: Recent Labs  Lab 08/25/17 0720 08/26/17 0732 08/27/17 0921 08/28/17 0522 08/31/2017 0630  WBC 11.1* 8.3 12.7* 11.8* 16.8*  HGB 9.9* 8.0* 8.1* 8.2* 7.5*  HCT  29.7* 24.3* 25.3* 24.5* 22.3*  MCV 90.5 91.0 90.7 88.8 87.5  PLT 287 274 320 357 434*   Blood Culture    Component Value Date/Time   SDES BLOOD RIGHT ARM 04/10/2010 1215   SPECREQUEST BOTTLES DRAWN AEROBIC ONLY 5CC 04/10/2010 1215   CULT NO GROWTH 5 DAYS 04/10/2010 1215   REPTSTATUS 04/16/2010 FINAL 04/10/2010 1215    CBG: Recent Labs  Lab 08/28/17 1544 08/28/17 2323 08/28/17 2355 09/09/2017 0522 09/14/2017 0817  GLUCAP 82 71 199* 94 93    Lab Results  Component Value Date   INR 1.53 08/28/2017   INR 1.34 04/10/2010   INR 1.13 04/03/2010   Studies/Results: Nm Hepatobiliary Liver Func  Result Date: 08/27/2017 CLINICAL DATA:  Cholecystectomy. Persistent abdominal pain and nausea. EXAM: NUCLEAR MEDICINE HEPATOBILIARY IMAGING TECHNIQUE: Sequential images of the abdomen were obtained out to 60 minutes following intravenous administration of radiopharmaceutical. RADIOPHARMACEUTICALS:  5.15 mCi Tc-18m Choletec IV COMPARISON:  CT 08/26/2017. FINDINGS:  Liver appears normal. Pulling of radiopharmaceutical noted about the medial aspect of the liver and within previous identified peroneal fluid. These findings suggest bile leak. IMPRESSION: Findings suggesting bile leak. Electronically Signed   By: Marcello Moores  Register   On: 08/27/2017 16:06   Dg Chest Port 1 View  Result Date: 09/11/2017 CLINICAL DATA:  ET tube placement EXAM: PORTABLE CHEST 1 VIEW COMPARISON:  09/13/2015 FINDINGS: Endotracheal tube is 2.6 cm above the carina. NG tube is in the stomach. Diffuse right lung airspace disease and airspace disease in the left lower lung. No visible effusions or pneumothorax. Heart is upper limits normal in size. IMPRESSION: New diffuse right lung and left lower lobe airspace disease. Endotracheal tube 2.6 cm above the carina. Electronically Signed   By: Rolm Baptise M.D.   On: 08/28/2017 10:21   Dg Abd Portable 1v  Result Date: 08/27/2017 CLINICAL DATA:  Evaluate gastric tube placement EXAM: PORTABLE  ABDOMEN - 1 VIEW COMPARISON:  None. FINDINGS: The bowel gas pattern is normal. The tip and side port of a gastric tube are seen in the body of the stomach. Enteric contrast is noted within small and large bowel loops without obstruction. Heart is enlarged with bibasilar streaky parenchymal opacities compatible with atelectasis or scarring. Surgical clips are seen in the right upper quadrant of the abdomen and also projecting over the left breast. No acute osseous abnormality. IMPRESSION: 1. Gastric tube noted in the body of the stomach. 2. Nonobstructed, nondistended bowel gas pattern contrast noted within. 3. Cardiomegaly with bibasilar atelectasis and/or scarring. Electronically Signed   By: Ashley Royalty M.D.   On: 08/27/2017 23:36    Medications: . ampicillin-sulbactam (UNASYN) IV    . [START ON 08/30/2017] ampicillin-sulbactam (UNASYN) IV    . fentaNYL infusion INTRAVENOUS 200 mcg/hr (09/17/2017 1100)  . phenylephrine (NEO-SYNEPHRINE) Adult infusion     . chlorhexidine gluconate (MEDLINE KIT)  15 mL Mouth Rinse BID  . doxercalciferol  2 mcg Intravenous Q M,W,F-HD  . mouth rinse  15 mL Mouth Rinse QID  . pantoprazole (PROTONIX) IV  40 mg Intravenous Daily  . sodium chloride flush  10-40 mL Intracatheter Q12H    Dialysis Orders: [facility]WF [facility] 3h 53mn 400/A1.5 61.5kg 2/2.25 bath P4 AVG LUE Hep 4000 - Hectoral 226m IV q HD - Mircera 3030mIV q 2 weeks (last 3/27)   Assessment/Plan: 1. Acute cholecystitis - s/p lap chole 4/6 Dr. WilRedmond Pullingost op pain, N/V overnight, suspected post op ileus, KUB looks ok, per surgery.  Narcotics d/c. CT scan showed fluid in abd, possible bile duck leak identified on HIDA scan, GI consulting- were going to do ERCP but pt aspirated and required intubation pre procedure 2. ESRD - tolerating HD. Last was Wed 4/10- would next be due tomorrow but will evaluate with labs and not do if still unstable 3. Anemia of CKD- Hgb 8.2, now 7.5. Aranesp 100m46miven 4/8. Follow  trend- may need transfusion 4. Secondary hyperparathyroidism - Ca ok. Holding binders while patient not eating. Continue VDRA.  5. HTN/volume - BP mostly low.  On clonidine patch- I took it off yesterday. Does not appear volume overloaded. Titrate down volume as tolerated. Weights all over the place 6. Nutrition - Alb 1.8- this is an issue. Renal diet with fluid restrictions once advanced.    Kjuan Seipp A  09/09/2017,11:57 AM  LOS: 7 days

## 2017-08-29 NOTE — Progress Notes (Signed)
ANTICOAGULATION CONSULT NOTE - Initial Consult  Pharmacy Consult for Heparin Indication: chest pain/ACS  Allergies  Allergen Reactions  . Erythromycin Nausea And Vomiting  . Vicodin [Hydrocodone-Acetaminophen]     unknown    Patient Measurements: Height: 5\' 2"  (157.5 cm) Weight: 136 lb (61.7 kg) IBW/kg (Calculated) : 50.1 Heparin Dosing Weight: 61.7  Vital Signs: Temp: 97.4 F (36.3 C) (04/11 1939) Temp Source: Axillary (04/11 1939) BP: 100/57 (04/11 2000) Pulse Rate: 92 (04/11 2000)  Labs: Recent Labs    08/28/17 0522 08/28/17 1258 09/16/2017 0630 08/19/2017 1052 08/19/2017 1236 09/12/2017 1708  HGB 8.2*  --  7.5*  --  7.6*  --   HCT 24.5*  --  22.3*  --  22.6*  --   PLT 357  --  434*  --  318  --   LABPROT  --  18.3*  --   --   --   --   INR  --  1.53  --   --   --   --   CREATININE 6.08*  --  3.50*  --  3.75*  --   TROPONINI  --   --   --  0.05*  --  1.40*    Estimated Creatinine Clearance: 10 mL/min (A) (by C-G formula based on SCr of 3.75 mg/dL (H)).   Medical History: Past Medical History:  Diagnosis Date  . Arthritis   . Blood transfusion without reported diagnosis   . Cancer [facility])    patient states she had br ca 13 years ago  . ESRD (end stage renal disease) on dialysis ([place])   . Hypertension   . Renal disorder    patient on dialysis  . Wears glasses     Assessment: 84 yo female ESRD patient with elevated troponin. Pharmacy asked to dose heparin. Recent drain placed with no blood loss. No bleeding noted. Patient with anemia of CKD. No PTA anticoagulation noted.    Goal of Therapy:  Heparin level 0.3-0.7 units/ml Monitor platelets by anticoagulation protocol: Yes   Plan:  Heparin 3500 unit bolus Heparin 700 units/hr infusion HL in 8 hours Daily CBC, HL  Leotha Westermeyer A Thoma Paulsen 09/05/2017,9:14 PM

## 2017-08-29 NOTE — Transfer of Care (Signed)
Immediate Anesthesia Transfer of Care Note  Patient: Pam Fuller  Procedure(s) Performed: ENDOSCOPIC RETROGRADE CHOLANGIOPANCREATOGRAPHY (ERCP) (N/A )  Patient Location: ICU  Anesthesia Type:General  Level of Consciousness: sedated  Airway & Oxygen Therapy: Patient remains intubated per anesthesia plan  Post-op Assessment: Report given to RN and Post -op Vital signs reviewed and stable  Post vital signs: Reviewed and stable  Last Vitals:  Vitals Value Taken Time  BP 95/58 09/03/2017  9:15 AM  Temp    Pulse 102 09/01/2017  9:12 AM  Resp 35 08/25/2017  9:22 AM  SpO2    Vitals shown include unvalidated device data.  Last Pain:  Vitals:   09/01/2017 0732  TempSrc: Oral  PainSc: 0-No pain      Patients Stated Pain Goal: 0 (11/13/92 8546)  Complications: No apparent anesthesia complications

## 2017-08-29 NOTE — Anesthesia Preprocedure Evaluation (Signed)
Anesthesia Evaluation  Patient identified by MRN, date of birth, ID band Patient awake    Reviewed: Allergy & Precautions, NPO status , Patient's Chart, lab work & pertinent test results  History of Anesthesia Complications Negative for: history of anesthetic complications  Airway Mallampati: IV  TM Distance: <3 FB Neck ROM: Limited  Mouth opening: Limited Mouth Opening  Dental  (+) Teeth Intact   Pulmonary neg pulmonary ROS,    breath sounds clear to auscultation       Cardiovascular hypertension, Pt. on medications (-) angina(-) Past MI and (-) CHF (-) dysrhythmias  Rhythm:Regular Rate:Normal     Neuro/Psych negative neurological ROS     GI/Hepatic GERD  Medicated,  Endo/Other    Renal/GU ESRF and DialysisRenal diseaseHd fri without difficulty, lue access     Musculoskeletal  (+) Arthritis ,   Abdominal   Peds  Hematology  (+) anemia ,   Anesthesia Other Findings   Reproductive/Obstetrics                             Anesthesia Physical  Anesthesia Plan  ASA: III  Anesthesia Plan: General   Post-op Pain Management:    Induction: Intravenous  PONV Risk Score and Plan: 3 and Ondansetron and Dexamethasone  Airway Management Planned: Oral ETT  Additional Equipment: None  Intra-op Plan:   Post-operative Plan: Extubation in OR  Informed Consent: I have reviewed the patients History and Physical, chart, labs and discussed the procedure including the risks, benefits and alternatives for the proposed anesthesia with the patient or authorized representative who has indicated his/her understanding and acceptance.   Dental advisory given  Plan Discussed with: CRNA and Surgeon  Anesthesia Plan Comments:         Anesthesia Quick Evaluation

## 2017-08-29 NOTE — Progress Notes (Signed)
RT NOTE: RT transported patient on ventilator from room 2M03 to IR and back to room 8Q77 with no complications.Vitals are stable and sats are 99%. RT will continue to monitor.

## 2017-08-29 NOTE — Addendum Note (Signed)
Addendum  created 09/06/2017 1354 by Lieutenant Diego, CRNA   Intraprocedure Meds edited

## 2017-08-29 NOTE — Consult Note (Signed)
PULMONARY / CRITICAL CARE MEDICINE   Name: Pam Fuller MRN: [id number] DOB: 1932-08-29    ADMISSION DATE:  09/11/2017 CONSULTATION DATE:  4/11  REFERRING MD:  Dr. Maudie Mercury  CHIEF COMPLAINT:  Aspiration  HISTORY OF PRESENT ILLNESS:   [name] is a 84 year old female with ESRD on MWF HD, HTN, Anemia, and hx of breast cancer who presented to the ED on 4/4 with RLQ abdominal pain. She was found to have acute cholecystitis with a gallstone lodged in the gallbladder neck. CBD measured up to 10 mm. She underwent laparoscopic cholecystectomy on 4/6. She initially did well but on 4/8 was noted to have worsened pain and confusion and thought to have post-op ileus. CT abd/pelvis 4/8 showed small-mod fluid in the abdominal pelvis, read as normal post op changes but could not exclude a bile leak. A hepatobiliary scan was obtained on 4/9 which did suggest a bile leak. Surgery recommended IR drain and GI consult for ERCP. She went to the Endoscopy unit on 4/11 AM for ERCP however during prep with anesthesia she had significant bilious emesis with hypotension requiring intubation and pressor support. ERCP was not attempted. She was subsequently transferred to the medical ICU.   PAST MEDICAL HISTORY :  She  has a past medical history of Arthritis, Blood transfusion without reported diagnosis, Cancer ([place]), ESRD (end stage renal disease) on dialysis ([place]), Hypertension, Renal disorder, and Wears glasses.  PAST SURGICAL HISTORY: She  has a past surgical history that includes Abdominal hysterectomy; Breast lumpectomy with axillary lymph node biopsy (2001); Appendectomy; Tonsillectomy; Colonoscopy; Partial mastectomy with needle localization (Left, 06/18/2013); Breast surgery; and Cholecystectomy (N/A, 09/12/2017).  Allergies  Allergen Reactions  . Erythromycin Nausea And Vomiting  . Vicodin [Hydrocodone-Acetaminophen]     unknown    No current facility-administered medications on file prior to  encounter.    Current Outpatient Medications on File Prior to Encounter  Medication Sig  . amLODipine (NORVASC) 10 MG tablet Take 10 mg by mouth every evening.   . calcium acetate (PHOSLO) 667 MG capsule Take 2 capsules (1,334 mg total) by mouth 3 (three) times daily with meals.  . cloNIDine (CATAPRES) 0.1 MG tablet Take 0.1 mg by mouth every evening.   . fluticasone (FLONASE) 50 MCG/ACT nasal spray Place 2 sprays into both nostrils daily.  . hydrALAZINE (APRESOLINE) 50 MG tablet Take 50 mg by mouth every evening.   . multivitamin (RENA-VIT) TABS tablet Take 1 tablet by mouth at bedtime.  . tamoxifen (NOLVADEX) 20 MG tablet Take 1 tablet (20 mg total) by mouth daily. (Patient taking differently: Take 20 mg by mouth every evening. )  . hydrOXYzine (ATARAX/VISTARIL) 25 MG tablet Take 1 tablet (25 mg total) by mouth every 8 (eight) hours as needed for itching. (Patient not taking: Reported on 08/22/2017)    FAMILY HISTORY:  Her indicated that her mother is deceased. She indicated that her father is deceased. She indicated that the status of her maternal aunt is unknown.   SOCIAL HISTORY: She  reports that she has never smoked. She has never used smokeless tobacco. She reports that she does not drink alcohol or use drugs.  REVIEW OF SYSTEMS:   Unable to obtain as patient intubated and sedated  SUBJECTIVE:  Intubated and sedated  VITAL SIGNS: BP (!) 98/54   Pulse 95   Temp (!) 96.5 F (35.8 C) (Axillary) Comment: RNAnderson Malta was notified  Resp 18   Ht 5\' 2"  (1.575 m)   Wt  136 lb (61.7 kg)   SpO2 100%   BMI 24.87 kg/m   HEMODYNAMICS:    VENTILATOR SETTINGS: Vent Mode: PRVC FiO2 (%):  [50 %-100 %] 50 % Set Rate:  [14 bmp-18 bmp] 18 bmp Vt Set:  [400 mL-450 mL] 400 mL PEEP:  [5 cmH20] 5 cmH20 Plateau Pressure:  [26 cmH20] 26 cmH20  INTAKE / OUTPUT: I/O last 3 completed shifts: In: 0  Out: 1546 [Emesis/NG output:650; Other:896]  PHYSICAL EXAMINATION: General:  Elderly  woman, intubated and sedated Neuro:  Sedated HEENT:  [place]/AT Cardiovascular:  RRR, AVF LUE with palpable thrill Lungs:  Rhonchi right lung fields, left lung field clear Abdomen:  Soft, non-distended Musculoskeletal:  No pedal edema Skin:  intact  LABS:  BMET Recent Labs  Lab 08/28/17 0522 09/04/2017 0630 09/02/2017 1236  NA 133* 139 140  K 3.7 3.2* 3.4*  CL 87* 94* 98*  CO2 26 29 28   BUN 38* 14 20  CREATININE 6.08* 3.50* 3.75*  GLUCOSE 147* 94 94    Electrolytes Recent Labs  Lab 08/23/17 0700 08/23/17 1438  08/28/17 0522 09/15/2017 0630 09/17/2017 1236  CALCIUM 8.2* 8.2*   < > 8.7* 9.0 8.5*  PHOS 5.9* 3.0  --   --   --   --    < > = values in this interval not displayed.    CBC Recent Labs  Lab 08/28/17 0522 08/26/2017 0630 09/09/2017 1236  WBC 11.8* 16.8* 9.3  HGB 8.2* 7.5* 7.6*  HCT 24.5* 22.3* 22.6*  PLT 357 434* 318    Coag's Recent Labs  Lab 08/28/17 1258  INR 1.53    Sepsis Markers No results for input(s): LATICACIDVEN, PROCALCITON, O2SATVEN in the last 168 hours.  ABG Recent Labs  Lab 09/11/2017 1112  PHART 7.398  PCO2ART 53.1*  PO2ART 292.0*    Liver Enzymes Recent Labs  Lab 08/27/17 0921 08/28/17 0522 09/15/2017 0630  AST 45* 28 22  ALT 36 28 23  ALKPHOS 176* 177* 197*  BILITOT 2.4* 2.5* 3.2*  ALBUMIN 2.2* 1.8* 2.5*    Cardiac Enzymes Recent Labs  Lab 08/27/2017 1052  TROPONINI 0.05*    Glucose Recent Labs  Lab 08/28/17 1544 08/28/17 2323 08/28/17 2355 09/03/2017 0522 08/31/2017 0817 08/28/2017 1214  GLUCAP 82 71 199* 94 93 90    Imaging Dg Chest Port 1 View  Result Date: 08/26/2017 CLINICAL DATA:  ET tube placement EXAM: PORTABLE CHEST 1 VIEW COMPARISON:  09/13/2015 FINDINGS: Endotracheal tube is 2.6 cm above the carina. NG tube is in the stomach. Diffuse right lung airspace disease and airspace disease in the left lower lung. No visible effusions or pneumothorax. Heart is upper limits normal in size. IMPRESSION: New diffuse  right lung and left lower lobe airspace disease. Endotracheal tube 2.6 cm above the carina. Electronically Signed   By: Rolm Baptise M.D.   On: 08/23/2017 10:21     STUDIES:  CT abd/pelv 4/4 >> acute cholecystitis, gallstone in GB neck, CBD 10 mm  CT Head: no acute changes, chronic small vessel ischemic changes  CT abd/pelvis 4/8 >> small-mod fluid in abd/pelvis  Hepatobiliary scan 4/9 >> bile leak  CXR 4/11 >> R lung airspace disease, ETT 2.6 cm above carina   CULTURES: Tracheal aspirate 4/11 >>  ANTIBIOTICS: Zosyn 4/3 >> 4/7 Unasyn 4/11 >>  SIGNIFICANT EVENTS: 4/4 - admitted w/ acute cholecystitis 4/6 - lap chole 4/9 - bile leak seen on hepatobiliary scan 4/11 - Aspirated during prep for ERCP, intubated and transferred  to ICU  LINES/TUBES: ETT 4/11 OGT 4/11 PIV RUE 4/9   DISCUSSION: [name] is a 84 year old female with ESRD on MWF HD, HTN, Anemia, and hx of breast cancer who was admitted 4/4 with acute cholecystitis. S/p lap chole 4/6 complicated by post-op bile leak. Went for ERCP 4/11 however developed bilious emesis during prep, hypotension, and required intubation plus pressor support. Transferred to the ICU on 4/11.  ASSESSMENT / PLAN:  PULMONARY A: Acute Respiratory Distress 2/2 Aspiration P:   Continue vent support Wean and SBT as able CXR in am ABG in am  CARDIOVASCULAR A:  Hypotension Trop 0.05 - likely demand ischemia P:  Wean neo as able  RENAL A:   ESRD on MWF HD P:   HD per Nephro  GASTROINTESTINAL A:   Acute Cholecystitis s/p lap chole 4/6 Bile leak P:   IR to place drain 4/11 Likely needs ERCP when stabilized  HEMATOLOGIC A:   Hx of Breast Cancer - no acute issue Anemia of Chronic Disease - no obvious bleeding P:  Monitor Hgb - Transfuse for Hgb <7.0  INFECTIOUS A:   Aspiration PNA Bile Leak P:   Continue Unasyn - can broaden to Zosyn if worsening clinical status  ENDOCRINE A:   Secondary  Hyperparathyroidism   P:   Intermittent CBG monitoring  NEUROLOGIC A:   Pain/Sedation P:   RASS goal: -1 Fentanyl gtt Versed prn   FAMILY  - Updates: Patients 3 sons and sister updated at bedside 4/11.  - Inter-disciplinary family meet or Palliative Care meeting due by:  09/05/17   Zada Finders, MD Internal Medicine PGY-3  09/13/2017, 3:29 PM

## 2017-08-29 NOTE — Anesthesia Postprocedure Evaluation (Signed)
Anesthesia Post Note  Patient: Pam Fuller  Procedure(s) Performed: CANCELLED PROCEDURE     Patient location during evaluation: Endoscopy Anesthesia Type: General Level of consciousness: sedated and patient remains intubated per anesthesia plan Respiratory status: respiratory function unstable and patient remains intubated per anesthesia plan Cardiovascular status: unstable Anesthesia complication: Unstable BP and pulm status , procdure cancelled. Comments: Patient transferred to ICU    Last Vitals:  Vitals:   09/04/2017 1245 09/07/2017 1300  BP: (!) 95/52 (!) 95/57  Pulse:    Resp: 18 18  Temp:    SpO2:      Last Pain:  Vitals:   08/25/2017 1218  TempSrc: Axillary  PainSc:                  [name],JAMES TERRILL

## 2017-08-29 NOTE — Interval H&P Note (Signed)
History and Physical Interval Note:  08/30/2017 8:34 AM  Pam Fuller  has presented today for surgery, with the diagnosis of bile duct leak  The various methods of treatment have been discussed with the patient and family. After consideration of risks, benefits and other options for treatment, the patient has consented to  Procedure(s): ENDOSCOPIC RETROGRADE CHOLANGIOPANCREATOGRAPHY (ERCP) (N/A) as a surgical intervention .  The patient's history has been reviewed, patient examined, no change in status, stable for surgery.  I have reviewed the patient's chart and labs.  Questions were answered to the patient's satisfaction.     Jackquline Denmark

## 2017-08-29 NOTE — Progress Notes (Signed)
CRITICAL VALUE ALERT  Critical Value:  Trop 1.4  Date & Time Notied:  09/14/2017 2035  Provider Notified: E-Link RN, Merci  Orders Received/Actions taken: Will obtain new EKG. E-link RN will notify Md.

## 2017-08-29 NOTE — Progress Notes (Signed)
Troponin elevated to 1.4. EKG unchanged from admission. Patient agitated, sedated unable to answer questions. Elevation most likely type II 2/2 her hypotension during procedure today.   Will start heparin & obtain ECHO.

## 2017-08-29 NOTE — Progress Notes (Signed)
RR increased to 18 per MD order. FIO2 titrated down to 50% per ABG. Vitals are stable. RT will continue to monitor.

## 2017-08-29 NOTE — Progress Notes (Signed)
Patient ID: Pam Fuller, female   DOB: 11/11/1932, 84 y.o.   MRN: [id number]    Day of Surgery  Subjective: Pt was taken down for ERCP and was being intubated.  Unfortunately she aspirated and procedure was aborted at that time. She has been brought to the ICU and is on a small amount of pressors and started on unasyn per CCM.  Objective: Vital signs in last 24 hours: Temp:  [97.9 F (36.6 C)-98.5 F (36.9 C)] 98.5 F (36.9 C) (04/11 0732) Pulse Rate:  [90-107] 102 (04/11 0912) Resp:  [19-29] 19 (04/11 0912) BP: (98-160)/(41-71) 98/59 (04/11 0912) SpO2:  [94 %-100 %] 94 % (04/11 0732) FiO2 (%):  [100 %] 100 % (04/11 0912) Weight:  [61.7 kg (136 lb)-63.9 kg (140 lb 14 oz)] 61.7 kg (136 lb) (04/11 0732) Last BM Date: 08/28/17  Intake/Output from previous day: 04/10 0701 - 04/11 0700 In: 0  Out: 896  Intake/Output this shift: Total I/O In: 450 [I.V.:200; IV Piggyback:250] Out: -   PE: Abd: soft, hypoactive BS, incisions are c/d/i.  OGT in place with 400cc of brown bilious output Heart: tachy, but regular Lungs: breath sounds noted bilaterally, more coarse on the right. ETT in place on Vent  Lab Results:  Recent Labs    08/28/17 0522 09/01/2017 0630  WBC 11.8* 16.8*  HGB 8.2* 7.5*  HCT 24.5* 22.3*  PLT 357 434*   BMET Recent Labs    08/28/17 0522 09/09/2017 0630  NA 133* 139  K 3.7 3.2*  CL 87* 94*  CO2 26 29  GLUCOSE 147* 94  BUN 38* 14  CREATININE 6.08* 3.50*  CALCIUM 8.7* 9.0   PT/INR Recent Labs    08/28/17 1258  LABPROT 18.3*  INR 1.53   CMP     Component Value Date/Time   NA 139 09/09/2017 0630   NA 142 02/12/2017 1400   K 3.2 (L) 09/08/2017 0630   K 3.9 02/12/2017 1400   CL 94 (L) 09/05/2017 0630   CO2 29 08/27/2017 0630   CO2 30 (H) 02/12/2017 1400   GLUCOSE 94 09/09/2017 0630   GLUCOSE 88 02/12/2017 1400   BUN 14 09/15/2017 0630   BUN 16.9 02/12/2017 1400   CREATININE 3.50 (H) 09/14/2017 0630   CREATININE 6.2 (HH) 02/12/2017 1400     CALCIUM 9.0 09/14/2017 0630   CALCIUM 9.2 02/12/2017 1400   PROT 6.6 08/28/2017 0630   PROT 6.8 02/12/2017 1400   ALBUMIN 2.5 (L) 09/07/2017 0630   ALBUMIN 3.3 (L) 02/12/2017 1400   AST 22 09/14/2017 0630   AST 13 02/12/2017 1400   ALT 23 08/28/2017 0630   ALT 7 02/12/2017 1400   ALKPHOS 197 (H) 09/04/2017 0630   ALKPHOS 75 02/12/2017 1400   BILITOT 3.2 (H) 09/01/2017 0630   BILITOT 0.68 02/12/2017 1400   GFRNONAA 11 (L) 08/25/2017 0630   GFRAA 13 (L) 08/31/2017 0630   Lipase     Component Value Date/Time   LIPASE 36 09/11/2017 1618       Studies/Results: Nm Hepatobiliary Liver Func  Result Date: 08/27/2017 CLINICAL DATA:  Cholecystectomy. Persistent abdominal pain and nausea. EXAM: NUCLEAR MEDICINE HEPATOBILIARY IMAGING TECHNIQUE: Sequential images of the abdomen were obtained out to 60 minutes following intravenous administration of radiopharmaceutical. RADIOPHARMACEUTICALS:  5.15 mCi Tc-67m  Choletec IV COMPARISON:  CT 08/26/2017. FINDINGS: Liver appears normal. Pulling of radiopharmaceutical noted about the medial aspect of the liver and within previous identified peroneal fluid. These findings suggest bile leak. IMPRESSION:  Findings suggesting bile leak. Electronically Signed   By: Marcello Moores  Register   On: 08/27/2017 16:06   Dg Abd Portable 1v  Result Date: 08/27/2017 CLINICAL DATA:  Evaluate gastric tube placement EXAM: PORTABLE ABDOMEN - 1 VIEW COMPARISON:  None. FINDINGS: The bowel gas pattern is normal. The tip and side port of a gastric tube are seen in the body of the stomach. Enteric contrast is noted within small and large bowel loops without obstruction. Heart is enlarged with bibasilar streaky parenchymal opacities compatible with atelectasis or scarring. Surgical clips are seen in the right upper quadrant of the abdomen and also projecting over the left breast. No acute osseous abnormality. IMPRESSION: 1. Gastric tube noted in the body of the stomach. 2. Nonobstructed,  nondistended bowel gas pattern contrast noted within. 3. Cardiomegaly with bibasilar atelectasis and/or scarring. Electronically Signed   By: Ashley Royalty M.D.   On: 08/27/2017 23:36    Anti-infectives: Anti-infectives (From admission, onward)   Start     Dose/Rate Route Frequency Ordered Stop   08/20/2017 0000  ampicillin-sulbactam (UNASYN) 1.5 g in sodium chloride 0.9 % 100 mL IVPB  Status:  Discontinued     1.5 g 200 mL/hr over 30 Minutes Intravenous Once 08/28/17 1045 08/23/2017 0928   09/12/2017 2200  piperacillin-tazobactam (ZOSYN) IVPB 3.375 g    Note to Pharmacy:  Zosyn 3.375 g IV q12h in ESRD on HD   3.375 g 12.5 mL/hr over 240 Minutes Intravenous Every 12 hours 08/27/2017 1155 08/25/17 1335   08/22/17 1000  piperacillin-tazobactam (ZOSYN) IVPB 3.375 g  Status:  Discontinued    Note to Pharmacy:  Zosyn 3.375 g IV q12h in ESRD on HD   3.375 g 12.5 mL/hr over 240 Minutes Intravenous Every 12 hours 08/22/17 0128 09/14/2017 1155   08/22/17 0045  piperacillin-tazobactam (ZOSYN) IVPB 3.375 g     3.375 g 12.5 mL/hr over 240 Minutes Intravenous  Once 08/22/17 0038 08/22/17 0145       Assessment/Plan HTN ESRD - HD MWF H/o breast cancer  Acute cholecystitis POD 5s/p lap chole4/6/19 by Dr. Redmond Pulling - CT scan 4/8 w/ small amount fluid around liver and layering in pelvis ; HIDA 4/9 positive for bile leak - Total bilirubin 3.2 -NG tube had been placed, but patient pulled out yesterday - patient aspirated and became hypotensive in endo suite prior to ERCP.  Procedure aborted.  She is now in the unit intubated on pressor support.  VDRF secondary to aspiration -CCM consulted.  Started on Unasyn  Hypotension -secondary to above.  On pressors, low dose at the moment  ID -zosyn 4/4>>4/7, Unasyn 4/11--> FEN -NPO VTE -SCDs, heparin Foley -none right now Follow up -Dr. Redmond Pulling  Plan- When stable, plan for IR drain and reattempt at ERCP for stent placement. -cont to follow      LOS: 7 days    Henreitta Cea , [facility] Surgery 08/26/2017, 9:31 AM Pager: [phone]

## 2017-08-29 NOTE — Progress Notes (Signed)
CRITICAL VALUE ALERT  Critical Value: troponin 0.05  Date & Time Notied: 1200  Provider Notified:DR. Vishal Patel  Orders Received/Actions taken:none

## 2017-08-29 NOTE — Procedures (Signed)
US guided placement of 10 Fr drain in right lateral abdominal fluid.  Dark brown fluid was aspirated.  No blood loss and no immediate complication.  Drain to gravity bag.

## 2017-08-29 NOTE — Progress Notes (Signed)
Spoke with pts nurse Anderson Malta, RN), she states that pt may be too unstable to travel at this time. She will call me back to confirm if MD wants this procedure done today or if he would like to wait.

## 2017-08-29 NOTE — Progress Notes (Signed)
Brief Note:  As the patient was being prepped for anesthesia in Endo unit, she had significant bilious emesis with hypotension requiring intubation by anesthesia.  Please see anesthesia note for details.  ERCP was not attempted.  Plan: Transfer to ICU.   Would recommend to proceed with abdominal drain by IR for biloma once stable from anesthesia as standpoint.   ERCP with stenting once stable. Discussed in detail with the patient's son.  Dr. Orene Desanctis has also discussed with the family.  Son does understand very high mortality due to multiple comorbid conditions, advanced age and bile leak.

## 2017-08-29 NOTE — Anesthesia Procedure Notes (Addendum)
Procedure Name: Intubation Date/Time: 08/20/2017 8:37 AM Performed by: Eulas Post, Yasha Tibbett W, CRNA Pre-anesthesia Checklist: Patient identified, Emergency Drugs available, Suction available and Patient being monitored Patient Re-evaluated:Patient Re-evaluated prior to induction Oxygen Delivery Method: Circle system utilized Preoxygenation: Pre-oxygenation with 100% oxygen Induction Type: IV induction Ventilation: Mask ventilation without difficulty Grade View: Grade II Tube type: Oral Tube size: 7.0 mm Number of attempts: 1 Airway Equipment and Method: Stylet and Oral airway Placement Confirmation: ETT inserted through vocal cords under direct vision,  positive ETCO2 and breath sounds checked- equal and bilateral Secured at: 22 cm Tube secured with: Tape Dental Injury: Teeth and Oropharynx as per pre-operative assessment  Difficulty Due To: Difficulty was anticipated, Difficult Airway- due to reduced neck mobility, Difficult Airway- due to anterior larynx, Difficult Airway- due to limited oral opening and Difficult Airway- due to dentition Future Recommendations: Recommend- induction with short-acting agent, and alternative techniques readily available Comments: Used glide scope as she was known difficult airway. TM intubated esophagus, copious amounts of light green bilious fluid from ETT. Good view with glide scope, intubated without issue. Noted to have pink, frothy fluid from ETT, Suctioned multiple times, equal BBS, positive ETCO2.

## 2017-08-30 ENCOUNTER — Inpatient Hospital Stay (HOSPITAL_COMMUNITY): Payer: Medicare Other

## 2017-08-30 DIAGNOSIS — I10 Essential (primary) hypertension: Secondary | ICD-10-CM

## 2017-08-30 DIAGNOSIS — E43 Unspecified severe protein-calorie malnutrition: Secondary | ICD-10-CM | POA: Insufficient documentation

## 2017-08-30 DIAGNOSIS — K9189 Other postprocedural complications and disorders of digestive system: Secondary | ICD-10-CM

## 2017-08-30 DIAGNOSIS — A419 Sepsis, unspecified organism: Secondary | ICD-10-CM

## 2017-08-30 DIAGNOSIS — I214 Non-ST elevation (NSTEMI) myocardial infarction: Secondary | ICD-10-CM

## 2017-08-30 DIAGNOSIS — R6521 Severe sepsis with septic shock: Secondary | ICD-10-CM

## 2017-08-30 DIAGNOSIS — I5030 Unspecified diastolic (congestive) heart failure: Secondary | ICD-10-CM

## 2017-08-30 LAB — ECHOCARDIOGRAM COMPLETE
HEIGHTINCHES: 62 in
WEIGHTICAEL: 2172.85 [oz_av]

## 2017-08-30 LAB — HEPATIC FUNCTION PANEL
ALK PHOS: 201 U/L — AB (ref 38–126)
ALT: 221 U/L — AB (ref 14–54)
AST: 607 U/L — ABNORMAL HIGH (ref 15–41)
Albumin: 1.9 g/dL — ABNORMAL LOW (ref 3.5–5.0)
BILIRUBIN DIRECT: 2.1 mg/dL — AB (ref 0.1–0.5)
BILIRUBIN INDIRECT: 1.5 mg/dL — AB (ref 0.3–0.9)
Total Bilirubin: 3.6 mg/dL — ABNORMAL HIGH (ref 0.3–1.2)
Total Protein: 5.4 g/dL — ABNORMAL LOW (ref 6.5–8.1)

## 2017-08-30 LAB — GLUCOSE, CAPILLARY
GLUCOSE-CAPILLARY: 224 mg/dL — AB (ref 65–99)
Glucose-Capillary: 108 mg/dL — ABNORMAL HIGH (ref 65–99)
Glucose-Capillary: 150 mg/dL — ABNORMAL HIGH (ref 65–99)
Glucose-Capillary: 192 mg/dL — ABNORMAL HIGH (ref 65–99)
Glucose-Capillary: 230 mg/dL — ABNORMAL HIGH (ref 65–99)
Glucose-Capillary: 34 mg/dL — CL (ref 65–99)
Glucose-Capillary: 75 mg/dL (ref 65–99)
Glucose-Capillary: 84 mg/dL (ref 65–99)

## 2017-08-30 LAB — TYPE AND SCREEN
ABO/RH(D): O NEG
ANTIBODY SCREEN: NEGATIVE

## 2017-08-30 LAB — CBC
HEMATOCRIT: 24.1 % — AB (ref 36.0–46.0)
HEMOGLOBIN: 8.1 g/dL — AB (ref 12.0–15.0)
MCH: 30 pg (ref 26.0–34.0)
MCHC: 33.6 g/dL (ref 30.0–36.0)
MCV: 89.3 fL (ref 78.0–100.0)
Platelets: 320 10*3/uL (ref 150–400)
RBC: 2.7 MIL/uL — ABNORMAL LOW (ref 3.87–5.11)
RDW: 15.6 % — ABNORMAL HIGH (ref 11.5–15.5)
WBC: 18.5 10*3/uL — AB (ref 4.0–10.5)

## 2017-08-30 LAB — POCT I-STAT 3, ART BLOOD GAS (G3+)
Acid-Base Excess: 5 mmol/L — ABNORMAL HIGH (ref 0.0–2.0)
Bicarbonate: 31 mmol/L — ABNORMAL HIGH (ref 20.0–28.0)
O2 SAT: 96 %
PCO2 ART: 53.7 mmHg — AB (ref 32.0–48.0)
PH ART: 7.369 (ref 7.350–7.450)
PO2 ART: 89 mmHg (ref 83.0–108.0)
Patient temperature: 98.2
TCO2: 33 mmol/L — AB (ref 22–32)

## 2017-08-30 LAB — RENAL FUNCTION PANEL
ALBUMIN: 2.1 g/dL — AB (ref 3.5–5.0)
Anion gap: 17 — ABNORMAL HIGH (ref 5–15)
BUN: 29 mg/dL — AB (ref 6–20)
CALCIUM: 8.6 mg/dL — AB (ref 8.9–10.3)
CO2: 23 mmol/L (ref 22–32)
CREATININE: 4.5 mg/dL — AB (ref 0.44–1.00)
Chloride: 100 mmol/L — ABNORMAL LOW (ref 101–111)
GFR, EST AFRICAN AMERICAN: 10 mL/min — AB (ref >60)
GFR, EST NON AFRICAN AMERICAN: 8 mL/min — AB (ref >60)
Glucose, Bld: 75 mg/dL (ref 65–99)
PHOSPHORUS: 4.8 mg/dL — AB (ref 2.5–4.6)
Potassium: 4.1 mmol/L (ref 3.5–5.1)
SODIUM: 140 mmol/L (ref 135–145)

## 2017-08-30 LAB — LACTIC ACID, PLASMA
LACTIC ACID, VENOUS: 2.1 mmol/L — AB (ref 0.5–1.9)
Lactic Acid, Venous: 5.6 mmol/L (ref 0.5–1.9)
Lactic Acid, Venous: 4.7 mmol/L (ref 0.5–1.9)

## 2017-08-30 LAB — TROPONIN I
TROPONIN I: 4.47 ng/mL — AB (ref <0.03)
TROPONIN I: 3.62 ng/mL — AB (ref <0.03)
Troponin I: 3.17 ng/mL (ref <0.03)

## 2017-08-30 LAB — MAGNESIUM: Magnesium: 2.1 mg/dL (ref 1.7–2.4)

## 2017-08-30 MED ORDER — ORAL CARE MOUTH RINSE
15.0000 mL | OROMUCOSAL | Status: DC
Start: 2017-08-30 — End: 2017-09-15
  Administered 2017-08-30 – 2017-09-15 (×156): 15 mL via OROMUCOSAL

## 2017-08-30 MED ORDER — HEPARIN (PORCINE) IN NACL 100-0.45 UNIT/ML-% IJ SOLN
700.0000 [IU]/h | INTRAMUSCULAR | Status: DC
  Administered 2017-08-30: 700 [IU]/h via INTRAVENOUS

## 2017-08-30 MED ORDER — DEXTROSE 50 % IV SOLN
INTRAVENOUS | Status: AC
  Administered 2017-08-30: 100 mL via INTRAVENOUS
  Filled 2017-08-30: qty 50

## 2017-08-30 MED ORDER — LACTATED RINGERS IV BOLUS
500.0000 mL | Freq: Once | INTRAVENOUS | Status: AC
  Administered 2017-08-30: 500 mL via INTRAVENOUS

## 2017-08-30 MED ORDER — INSULIN ASPART 100 UNIT/ML ~~LOC~~ SOLN
0.0000 [IU] | Freq: Three times a day (TID) | SUBCUTANEOUS | Status: DC
  Administered 2017-08-31: 3 [IU] via SUBCUTANEOUS

## 2017-08-30 MED ORDER — MIDAZOLAM HCL 2 MG/2ML IJ SOLN: 2.0000 mg | Freq: Once | INTRAMUSCULAR | Status: AC

## 2017-08-30 MED ORDER — VASOPRESSIN 20 UNIT/ML IV SOLN
0.0300 [IU]/min | INTRAVENOUS | Status: DC
  Administered 2017-08-30: 0.03 [IU]/min via INTRAVENOUS
  Filled 2017-08-30: qty 2

## 2017-08-30 MED ORDER — MIDAZOLAM HCL 2 MG/2ML IJ SOLN
INTRAMUSCULAR | Status: AC
  Administered 2017-08-30: 2 mg
  Filled 2017-08-30: qty 2

## 2017-08-30 MED ORDER — SODIUM CHLORIDE 0.9 % IV SOLN
0.0000 ug/min | INTRAVENOUS | Status: DC
  Filled 2017-08-30: qty 4

## 2017-08-30 MED ORDER — HEPARIN SODIUM (PORCINE) 1000 UNIT/ML IJ SOLN
2.8000 mL | Freq: Once | INTRAMUSCULAR | Status: AC
  Administered 2017-08-30: 2800 [IU] via INTRAVENOUS
  Filled 2017-08-30: qty 3

## 2017-08-30 MED ORDER — DEXTROSE 10 % IV SOLN
INTRAVENOUS | Status: DC
  Administered 2017-08-30 (×2): via INTRAVENOUS

## 2017-08-30 MED ORDER — SODIUM CHLORIDE 0.9 % IV BOLUS
500.0000 mL | Freq: Once | INTRAVENOUS | Status: AC
  Administered 2017-08-30: 500 mL via INTRAVENOUS

## 2017-08-30 MED ORDER — DEXTROSE 50 % IV SOLN
2.0000 | Freq: Once | INTRAVENOUS | Status: AC
  Administered 2017-08-30: 100 mL via INTRAVENOUS

## 2017-08-30 MED ORDER — DEXTROSE 50 % IV SOLN
INTRAVENOUS | Status: AC
  Filled 2017-08-30: qty 50

## 2017-08-30 MED ORDER — DEXTROSE 5 % IV SOLN
0.0000 ug/min | INTRAVENOUS | Status: DC
  Administered 2017-08-30: 2 ug/min via INTRAVENOUS
  Filled 2017-08-30: qty 4

## 2017-08-30 MED ORDER — ASPIRIN 81 MG PO CHEW
81.0000 mg | CHEWABLE_TABLET | Freq: Every day | ORAL | Status: DC
  Administered 2017-08-30 – 2017-09-09 (×11): 81 mg
  Filled 2017-08-30 (×10): qty 1

## 2017-08-30 MED ORDER — NOREPINEPHRINE BITARTRATE 1 MG/ML IV SOLN
0.0000 ug/min | INTRAVENOUS | Status: DC
  Administered 2017-08-30: 35 ug/min via INTRAVENOUS
  Filled 2017-08-30: qty 16

## 2017-08-30 MED ORDER — DEXTROSE 5 % IV SOLN
INTRAVENOUS | Status: DC
  Administered 2017-08-30 – 2017-09-04 (×4): via INTRAVENOUS

## 2017-08-30 MED ORDER — PIPERACILLIN-TAZOBACTAM 3.375 G IVPB
3.3750 g | Freq: Two times a day (BID) | INTRAVENOUS | Status: DC
  Administered 2017-08-30 (×2): 3.375 g via INTRAVENOUS
  Filled 2017-08-30 (×3): qty 50

## 2017-08-30 NOTE — Progress Notes (Signed)
CRITICAL VALUE ALERT  Critical Value:  Trop 3.17  Date & Time Notied:  08/30/17 0210  Provider Notified: Charlynn Grimes, CCM Resident  Orders Received/Actions taken: Will continue to trend troponin. Pt currently on Hep. gtt

## 2017-08-30 NOTE — Care Management Note (Addendum)
Case Management Note  Patient Details  Name: ALEXXUS SOBH MRN: [id number] Date of Birth: [birth year]  Subjective/Objective:  Pt s/p lap chole - during procedure pt aspirated and suffered a MI                  Action/Plan:     Expected Discharge Date:                  Expected Discharge Plan:     In-House Referral:  Clinical Social Work  Discharge planning Services  CM Consult  Post Acute Care Choice:    Choice offered to:     DME Arranged:    DME Agency:     HH Arranged:    [place] Agency:     Status of Service:     If discussed at [employer], dates discussed:    Additional Comments: 08/30/2017 Pt transferred to ICU ventilated on pressors and may require CRRT Maryclare Labrador, RN 08/30/2017, 3:31 PM
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
## 2017-08-30 NOTE — Procedures (Signed)
Hemodialysis Insertion Procedure Note Pam Fuller [id number] 05-13-1933  Procedure: Insertion of Hemodialysis Catheter Type: 3 port  Indications: Hemodialysis   Procedure Details Consent: Risks of procedure as well as the alternatives and risks of each were explained to the (patient/caregiver).  Consent for procedure obtained. Time Out: Verified patient identification, verified procedure, site/side was marked, verified correct patient position, special equipment/implants available, medications/allergies/relevent history reviewed, required imaging and test results available.  Performed  Maximum sterile technique was used including antiseptics, cap, gloves, gown, hand hygiene, mask and sheet. Skin prep: Chlorhexidine; local anesthetic administered A antimicrobial bonded/coated triple lumen catheter was placed in the left internal jugular vein using the Seldinger technique. Ultrasound guidance used.Yes.   Catheter placed to 19 cm. Blood aspirated via all 3 ports and then flushed x 3. Line sutured x 2 and dressing applied.  Evaluation Blood flow good Complications: No apparent complications Patient did tolerate procedure well. Chest X-ray ordered to verify placement.  CXR: pending.  Georgann Housekeeper, AGACNP-BC [facility] Pulmonology/Critical Care Pager [phone] or [phone]  08/30/2017 11:57 AM

## 2017-08-30 NOTE — Progress Notes (Signed)
Patient ID: Pam Fuller, female   DOB: 1932-10-16, 84 y.o.   MRN: [id number]    1 Day Post-Op  Subjective: Events noted that patient likely having an NSTEMI.  Still on vent.  Opens eyes to voice, but doesn't nod to questions.  Objective: Vital signs in last 24 hours: Temp:  [96.5 F (35.8 C)-98.2 F (36.8 C)] 98.2 F (36.8 C) (04/12 0718) Pulse Rate:  [71-122] 86 (04/12 0750) Resp:  [10-100] 18 (04/12 0750) BP: (82-154)/(40-98) 120/57 (04/12 0750) SpO2:  [89 %-100 %] 99 % (04/12 0750) FiO2 (%):  [40 %-50 %] 40 % (04/12 0750) Weight:  [61.6 kg (135 lb 12.9 oz)] 61.6 kg (135 lb 12.9 oz) (04/12 0500) Last BM Date: 08/19/2017  Intake/Output from previous day: 04/11 0701 - 04/12 0700 In: 3724.6 [I.V.:3474.6; IV Piggyback:250] Out: 1450 [Emesis/NG output:500; Drains:950] Intake/Output this shift: Total I/O In: 500 [IV Piggyback:500] Out: -   PE: Abd: soft, but guards with palpation, hypoactive BS, but OG not putting much out. 72cc yesterday. Bilary drain in place with 950cc of bilious output.  Lab Results:  Recent Labs    08/26/2017 1236 08/30/17 0454  WBC 9.3 18.5*  HGB 7.6* 8.1*  HCT 22.6* 24.1*  PLT 318 320   BMET Recent Labs    08/20/2017 1236 08/30/17 0454  NA 140 140  K 3.4* 4.1  CL 98* 100*  CO2 28 23  GLUCOSE 94 75  BUN 20 29*  CREATININE 3.75* 4.50*  CALCIUM 8.5* 8.6*   PT/INR Recent Labs    08/28/17 1258  LABPROT 18.3*  INR 1.53   CMP     Component Value Date/Time   NA 140 08/30/2017 0454   NA 142 02/12/2017 1400   K 4.1 08/30/2017 0454   K 3.9 02/12/2017 1400   CL 100 (L) 08/30/2017 0454   CO2 23 08/30/2017 0454   CO2 30 (H) 02/12/2017 1400   GLUCOSE 75 08/30/2017 0454   GLUCOSE 88 02/12/2017 1400   BUN 29 (H) 08/30/2017 0454   BUN 16.9 02/12/2017 1400   CREATININE 4.50 (H) 08/30/2017 0454   CREATININE 6.2 (HH) 02/12/2017 1400   CALCIUM 8.6 (L) 08/30/2017 0454   CALCIUM 9.2 02/12/2017 1400   PROT 6.6 09/12/2017 0630   PROT 6.8  02/12/2017 1400   ALBUMIN 2.1 (L) 08/30/2017 0454   ALBUMIN 3.3 (L) 02/12/2017 1400   AST 22 08/31/2017 0630   AST 13 02/12/2017 1400   ALT 23 09/03/2017 0630   ALT 7 02/12/2017 1400   ALKPHOS 197 (H) 08/23/2017 0630   ALKPHOS 75 02/12/2017 1400   BILITOT 3.2 (H) 09/14/2017 0630   BILITOT 0.68 02/12/2017 1400   GFRNONAA 8 (L) 08/30/2017 0454   GFRAA 10 (L) 08/30/2017 0454   Lipase     Component Value Date/Time   LIPASE 36 08/30/2017 1618       Studies/Results: Ir US Guide Bx Asp/drain  Result Date: 09/04/2017 INDICATION: 84 year old with cholecystectomy and postoperative bile leak. Plan for drain placement within the abdominal fluid. EXAM: ULTRASOUND-GUIDED PLACEMENT OF DRAINAGE CATHETER IN THE RIGHT ABDOMINAL FLUID MEDICATIONS: None ANESTHESIA/SEDATION: None COMPLICATIONS: None immediate. PROCEDURE: Informed written consent was obtained from the patient's family after a thorough discussion of the procedural risks, benefits and alternatives. All questions were addressed. Maximal Sterile Barrier Technique was utilized including caps, mask, sterile gowns, sterile gloves, sterile drape, hand hygiene and skin antiseptic. A timeout was performed prior to the initiation of the procedure. Ultrasound demonstrated free fluid along the right  lateral abdomen and adjacent to the inferior right hepatic lobe. Large amount of floating bowel in this area. The right side of the abdomen was prepped and draped in sterile fashion. Skin and soft tissues were anesthetized with 1% lidocaine. Yueh catheter was directed into the right abdominal fluid with ultrasound guidance. Dark brown fluid was aspirated. Stiff Amplatz wire was advanced into the abdomen and the tract was dilated to accommodate a 10 [place] multipurpose drain. Sample of fluid was sent for culture. Drain was attached to a gravity bag and sutured to skin. FINDINGS: Free fluid in the right lateral abdomen. No significant fluid around the liver.  Drainage catheter was placed within this free fluid. Dark brown fluid was aspirated and most compatible with bile. IMPRESSION: Ultrasound-guided placement of a drainage catheter in the right lateral abdomen. Electronically Signed   By: Markus Daft M.D.   On: 09/14/2017 18:31   Dg Chest Port 1 View  Result Date: 08/30/2017 CLINICAL DATA:  84 year old female postop day 6 from cholecystectomy. Postoperative bilious emesis and hypotension requiring intubation and pressor support. EXAM: PORTABLE CHEST 1 VIEW COMPARISON:  08/26/2017 and earlier. FINDINGS: Portable AP semi upright view at 0452 hours. It appears the patient has been extubated, no definite ET tube identified. The enteric tube remains in place. Stable lung volumes, but confluent airspace opacity throughout the right lung has progressed in the upper and peripheral lung since yesterday. Patchy and confluent left lung base opacity and perihilar persists and is stable. No pneumothorax or pleural effusion. Stable cardiac size and mediastinal contours. Calcified aortic atherosclerosis. Paucity of bowel gas in the upper abdomen. IMPRESSION: 1. It appears the patient has been extubated, no definite ET tube identified. 2. Stable enteric tube coursing to the abdomen. 3. Progressed and widespread abnormal right lung opacity, as well as left lung base and perihilar opacity. The appearance is suspicious for aspiration and pneumonia in this clinical setting. Electronically Signed   By: Genevie Ann M.D.   On: 08/30/2017 07:34   Dg Chest Port 1 View  Result Date: 09/12/2017 CLINICAL DATA:  ET tube placement EXAM: PORTABLE CHEST 1 VIEW COMPARISON:  09/13/2015 FINDINGS: Endotracheal tube is 2.6 cm above the carina. NG tube is in the stomach. Diffuse right lung airspace disease and airspace disease in the left lower lung. No visible effusions or pneumothorax. Heart is upper limits normal in size. IMPRESSION: New diffuse right lung and left lower lobe airspace disease.  Endotracheal tube 2.6 cm above the carina. Electronically Signed   By: Rolm Baptise M.D.   On: 08/19/2017 10:21    Anti-infectives: Anti-infectives (From admission, onward)   Start     Dose/Rate Route Frequency Ordered Stop   08/30/17 2000  Ampicillin-Sulbactam (UNASYN) 3 g in sodium chloride 0.9 % 100 mL IVPB     3 g 200 mL/hr over 30 Minutes Intravenous Every 24 hours 09/02/2017 0953     09/11/2017 1030  Ampicillin-Sulbactam (UNASYN) 3 g in sodium chloride 0.9 % 100 mL IVPB     3 g 200 mL/hr over 30 Minutes Intravenous  Once 08/26/2017 0953 08/26/2017 1246   08/30/2017 0000  ampicillin-sulbactam (UNASYN) 1.5 g in sodium chloride 0.9 % 100 mL IVPB  Status:  Discontinued     1.5 g 200 mL/hr over 30 Minutes Intravenous Once 08/28/17 1045 09/04/2017 0928   09/13/2017 2200  piperacillin-tazobactam (ZOSYN) IVPB 3.375 g    Note to Pharmacy:  Zosyn 3.375 g IV q12h in ESRD on HD   3.375 g  12.5 mL/hr over 240 Minutes Intravenous Every 12 hours 09/08/2017 1155 08/25/17 1335   08/22/17 1000  piperacillin-tazobactam (ZOSYN) IVPB 3.375 g  Status:  Discontinued    Note to Pharmacy:  Zosyn 3.375 g IV q12h in ESRD on HD   3.375 g 12.5 mL/hr over 240 Minutes Intravenous Every 12 hours 08/22/17 0128 09/08/2017 1155   08/22/17 0045  piperacillin-tazobactam (ZOSYN) IVPB 3.375 g     3.375 g 12.5 mL/hr over 240 Minutes Intravenous  Once 08/22/17 0038 08/22/17 0145       Assessment/Plan HTN ESRD - HD MWF H/o breast cancer  Acute cholecystitis POD6s/p lap chole4/6/19 by Dr. Redmond Pulling - CT scan 4/8 w/ small amount fluid around liver and layering in pelvis ; HIDA 4/9 positive for bile leak - Total bilirubin 3.2 yesterday -drain placed for biloma.  -still no ERCP for stent placement yet.  Now with likely NSTEMI.  Defer timing of ERCP to GI. -cont OG while intubated.  May be able to start TFs at some point in the future, but still very quiet on exam.  VDRF secondary to aspiration -CCM consulted.  Started on  Unasyn  Hypotension -secondary to above.  On pressors still  NSTEMI -per CCM  ID -zosyn 4/4>>4/7, Unasyn 4/11--> FEN -NPO/OGT VTE -SCDs, heparin gtt Foley -per CCM Follow up -Dr. Redmond Pulling  Plan- -appreciate all the care this patient is receiving.   -cont biliary drain for now.  Still needs ERCP when felt appropriate by GI, CCM, etc from cardiac standpoint. -will follow.   LOS: 8 days    Henreitta Cea , [facility] Surgery 08/30/2017, 9:21 AM Pager: [phone]

## 2017-08-30 NOTE — Progress Notes (Addendum)
PULMONARY / CRITICAL CARE MEDICINE   Name: Pam Fuller MRN: [id number] DOB: [birth year]    ADMISSION DATE:  08/23/2017 CONSULTATION DATE:  4/11  REFERRING MD:  Dr. Maudie Mercury  CHIEF COMPLAINT:  Aspiration  BRIEF SUMMARY:   Pam Fuller is a 84 year old female with ESRD on MWF HD, HTN, Anemia, and hx of breast cancer who presented to the ED on 4/4 with RLQ abdominal pain. She was found to have acute cholecystitis with a gallstone lodged in the gallbladder neck. CBD measured up to 10 mm. She underwent laparoscopic cholecystectomy on 4/6. She initially did well but on 4/8 was noted to have worsened pain and confusion and thought to have post-op ileus. CT abd/pelvis 4/8 showed small-mod fluid in the abdominal pelvis, read as normal post op changes but could not exclude a bile leak. A hepatobiliary scan was obtained on 4/9 which did suggest a bile leak. Surgery recommended IR drain and GI consult for ERCP. She went to the Endoscopy unit on 4/11 AM for ERCP however during prep with anesthesia she had significant bilious emesis with hypotension requiring intubation and pressor support. ERCP was not attempted. She was subsequently transferred to the medical ICU.   STUDIES:  CT abd/pelv 4/4 >> acute cholecystitis, gallstone in GB neck, CBD 10 mm  CT Head: no acute changes, chronic small vessel ischemic changes  CT abd/pelvis 4/8 >> small-mod fluid in abd/pelvis  Hepatobiliary scan 4/9 >> bile leak  CXR 4/11 >> R lung airspace disease, ETT 2.6 cm above carina   CULTURES: Tracheal aspirate 4/11 >> Abd fluid 4/11 >>  ANTIBIOTICS: Zosyn 4/3 >> 4/7 Unasyn 4/11 >>  SIGNIFICANT EVENTS: 4/4 - admitted w/ acute cholecystitis 4/6 - lap chole 4/9 - bile leak seen on hepatobiliary scan 4/11 - Aspirated during prep for ERCP, intubated and transferred to ICU 4/11 - Rt abd fluid drain placed by IR  LINES/TUBES: ETT 4/11 OGT 4/11 PIV RUE 4/9 RLQ Biliary Drain 4/11   SUBJECTIVE:  Intubated  and sedated, but opens eyes. Troponins elevated and trending up overnight. Started on Heparin gtt.  VITAL SIGNS: BP (!) 107/47 (BP Location: Right Arm)   Pulse 79   Temp 98.1 F (36.7 C) (Oral)   Resp 18   Ht 5\' 2"  (1.575 m)   Wt 135 lb 12.9 oz (61.6 kg)   SpO2 100%   BMI 24.84 kg/m   HEMODYNAMICS:    VENTILATOR SETTINGS: Vent Mode: PRVC FiO2 (%):  [40 %-100 %] 40 % Set Rate:  [14 bmp-18 bmp] 18 bmp Vt Set:  [400 mL-450 mL] 400 mL PEEP:  [5 cmH20] 5 cmH20 Plateau Pressure:  [21 cmH20-26 cmH20] 22 cmH20  INTAKE / OUTPUT: I/O last 3 completed shifts: In: 3724.6 [I.V.:3474.6; IV Piggyback:250] Out: 2993 [Emesis/NG output:500; Drains:950]  PHYSICAL EXAMINATION: General:  Elderly woman, intubated Neuro: opens eyes HEENT:  [place]/AT, ETT, OGT in place Cardiovascular:  RRR, AVF LUE with palpable thrill Lungs:  Rhonchi bilateral upper lung fields anteriorly Abdomen:  Soft, non-distended, right abdominal drain in place Musculoskeletal:  No pedal edema Skin:  intact  LABS:  BMET Recent Labs  Lab 09/14/2017 0630 08/20/2017 1236 08/30/17 0454  NA 139 140 140  K 3.2* 3.4* 4.1  CL 94* 98* 100*  CO2 29 28 23   BUN 14 20 29*  CREATININE 3.50* 3.75* 4.50*  GLUCOSE 94 94 75    Electrolytes Recent Labs  Lab 08/23/17 1438  09/11/2017 0630 08/28/2017 1236 08/30/17 0454  CALCIUM 8.2*   < >  9.0 8.5* 8.6*  MG  --   --   --   --  2.1  PHOS 3.0  --   --   --  4.8*   < > = values in this interval not displayed.    CBC Recent Labs  Lab 09/07/2017 0630 08/25/2017 1236 08/30/17 0454  WBC 16.8* 9.3 18.5*  HGB 7.5* 7.6* 8.1*  HCT 22.3* 22.6* 24.1*  PLT 434* 318 320    Coag's Recent Labs  Lab 08/28/17 1258  INR 1.53    Sepsis Markers Recent Labs  Lab 08/30/17 0454  LATICACIDVEN 2.1*    ABG Recent Labs  Lab 09/17/2017 1112 08/30/17 0252  PHART 7.398 7.369  PCO2ART 53.1* 53.7*  PO2ART 292.0* 89.0    Liver Enzymes Recent Labs  Lab 08/27/17 0921 08/28/17 0522  08/20/2017 0630 08/30/17 0454  AST 45* 28 22  --   ALT 36 28 23  --   ALKPHOS 176* 177* 197*  --   BILITOT 2.4* 2.5* 3.2*  --   ALBUMIN 2.2* 1.8* 2.5* 2.1*    Cardiac Enzymes Recent Labs  Lab 08/22/2017 1708 08/30/17 0057 08/30/17 0454  TROPONINI 1.40* 3.17* 4.47*    Glucose Recent Labs  Lab 09/15/2017 0817 08/20/2017 1214 09/15/2017 1753 09/12/2017 2346 08/30/17 0031 08/30/17 0420  GLUCAP 93 90 94 68 108* 84    Imaging Ir US Guide Bx Asp/drain  Result Date: 09/02/2017 INDICATION: 84 year old with cholecystectomy and postoperative bile leak. Plan for drain placement within the abdominal fluid. EXAM: ULTRASOUND-GUIDED PLACEMENT OF DRAINAGE CATHETER IN THE RIGHT ABDOMINAL FLUID MEDICATIONS: None ANESTHESIA/SEDATION: None COMPLICATIONS: None immediate. PROCEDURE: Informed written consent was obtained from the patient's family after a thorough discussion of the procedural risks, benefits and alternatives. All questions were addressed. Maximal Sterile Barrier Technique was utilized including caps, mask, sterile gowns, sterile gloves, sterile drape, hand hygiene and skin antiseptic. A timeout was performed prior to the initiation of the procedure. Ultrasound demonstrated free fluid along the right lateral abdomen and adjacent to the inferior right hepatic lobe. Large amount of floating bowel in this area. The right side of the abdomen was prepped and draped in sterile fashion. Skin and soft tissues were anesthetized with 1% lidocaine. Yueh catheter was directed into the right abdominal fluid with ultrasound guidance. Dark brown fluid was aspirated. Stiff Amplatz wire was advanced into the abdomen and the tract was dilated to accommodate a 10 [place] multipurpose drain. Sample of fluid was sent for culture. Drain was attached to a gravity bag and sutured to skin. FINDINGS: Free fluid in the right lateral abdomen. No significant fluid around the liver. Drainage catheter was placed within this free  fluid. Dark brown fluid was aspirated and most compatible with bile. IMPRESSION: Ultrasound-guided placement of a drainage catheter in the right lateral abdomen. Electronically Signed   By: Markus Daft M.D.   On: 09/01/2017 18:31   Dg Chest Port 1 View  Result Date: 09/07/2017 CLINICAL DATA:  ET tube placement EXAM: PORTABLE CHEST 1 VIEW COMPARISON:  09/13/2015 FINDINGS: Endotracheal tube is 2.6 cm above the carina. NG tube is in the stomach. Diffuse right lung airspace disease and airspace disease in the left lower lung. No visible effusions or pneumothorax. Heart is upper limits normal in size. IMPRESSION: New diffuse right lung and left lower lobe airspace disease. Endotracheal tube 2.6 cm above the carina. Electronically Signed   By: Rolm Baptise M.D.   On: 09/14/2017 10:21     DISCUSSION: Pam Fuller  is a 85 year old female with ESRD on MWF HD, HTN, Anemia, and hx of breast cancer who was admitted 4/4 with acute cholecystitis. S/p lap chole 4/6 complicated by post-op bile leak. Went for ERCP 4/11 however developed bilious emesis during prep, hypotension, and required intubation plus pressor support. Transferred to the ICU on 4/11.  ASSESSMENT / PLAN:  PULMONARY A: Acute Respiratory Distress 2/2 Aspiration P:   Continue vent support Wean and SBT as able Monitor CXR/ABG  CARDIOVASCULAR A:  Septic Shock/Hypotension Elevated Troponin 4.47 - possibly Type II NSTEMI in setting of hypotensive event with decreased clearance in ESRD Aortic Atherosclerosis on CXR RBBB - present prior to admission P:  Continue neo for MAP >65, titrate off as able Trend troponins Heparin gtt - monitor for bleedin Echocardiogram  RENAL A:   ESRD on MWF HD P:   HD per Nephro  GASTROINTESTINAL A:   Acute Cholecystitis s/p lap chole 4/6 Bile leak s/p IR biliary drain 4/11 P:   Biliary drain in place May need ERCP when stabilized  HEMATOLOGIC A:   Hx of Breast Cancer - no acute  issue Anemia of Chronic Disease - no obvious bleeding Leukocytosis likely secondary to Aspiration PNA P:  Monitor Hgb - Transfuse for Hgb <7.0  INFECTIOUS A:   Septic Shock from Aspiration PNA Bile Leak s/p IR Biliary Drain 4/11 P:   Continue Unasyn - can broaden to Zosyn if worsening clinical status F/u cultures  ENDOCRINE A:   Secondary Hyperparathyroidism   P:   Intermittent CBG monitoring  NEUROLOGIC A:   Pain/Sedation P:   RASS goal: -1 Fentanyl gtt Versed prn   FAMILY  - Updates: Patients 3 sons and sister updated at bedside 4/11.  - Inter-disciplinary family meet or Palliative Care meeting due by:  09/05/17   Zada Finders, MD Internal Medicine PGY-3  08/30/2017, 7:21 AM

## 2017-08-30 NOTE — Progress Notes (Signed)
ANTICOAGULATION CONSULT NOTE - [facility] for Heparin Indication: chest pain/ACS  Allergies  Allergen Reactions  . Erythromycin Nausea And Vomiting  . Vicodin [Hydrocodone-Acetaminophen]     unknown    Patient Measurements: Height: 5\' 2"  (157.5 cm) Weight: 135 lb 12.9 oz (61.6 kg) IBW/kg (Calculated) : 50.1 Heparin Dosing Weight: 61 kg  Vital Signs: Temp: 98.2 F (36.8 C) (04/12 0718) Temp Source: Oral (04/12 0718) BP: 113/62 (04/12 1100) Pulse Rate: 87 (04/12 1100)  Labs: Recent Labs    08/28/17 1258 08/31/2017 0630  08/20/2017 1236 08/26/2017 1708 08/30/17 0057 08/30/17 0454  HGB  --  7.5*  --  7.6*  --   --  8.1*  HCT  --  22.3*  --  22.6*  --   --  24.1*  PLT  --  434*  --  318  --   --  320  LABPROT 18.3*  --   --   --   --   --   --   INR 1.53  --   --   --   --   --   --   CREATININE  --  3.50*  --  3.75*  --   --  4.50*  TROPONINI  --   --    < >  --  1.40* 3.17* 4.47*   < > = values in this interval not displayed.    Estimated Creatinine Clearance: 8.3 mL/min (A) (by C-G formula based on SCr of 4.5 mg/dL (H)).   Medical History: Past Medical History:  Diagnosis Date  . Arthritis   . Blood transfusion without reported diagnosis   . Cancer [facility])    patient states she had br ca 13 years ago  . ESRD (end stage renal disease) on dialysis ([place])   . Hypertension   . Renal disorder    patient on dialysis  . Wears glasses     Assessment: 84 yo female ESRD patient with elevated troponin and concern for evolving ACS. Pharmacy consulted to dose IV heparin. Heparin held this morning for HD line placement, to resume at 1300 per PCCM. Heparin initiated at 700 units/hr overnight, no levels drawn before it was held so will resume this dose.  Goal of Therapy:  Heparin level 0.3-0.7 units/ml Monitor platelets by anticoagulation protocol: Yes   Plan:  -Start heparin with no bolus at 700 units/hr at 1300 -Check 8-hr heparin level -Monitor  heparin level, CBC, S/Sx bleeding daily  Arrie Senate, PharmD, BCPS PGY-2 Cardiology Pharmacy Resident Pager: [phone] 08/30/2017

## 2017-08-30 NOTE — Progress Notes (Signed)
Initial Nutrition Assessment  DOCUMENTATION CODES:   Non-severe (moderate) malnutrition in context of acute illness/injury  INTERVENTION:   When able to begin enteral nutrition, recommend:  Vital AF 1.2 at 20 ml/h, increase by 10 ml every 4 hours to goal rate of 40 ml/h (960 ml per day)  Pro-stat 30 ml once daily  Provides 1252 kcal, 87 gm protein, 779 ml free water daily  NUTRITION DIAGNOSIS:   Moderate Malnutrition related to acute illness(cholecystitis, bile leak) as evidenced by energy intake < 75% for > 7 days, mild muscle depletion.  GOAL:   Patient will meet greater than or equal to 90% of their needs  MONITOR:   Vent status, Skin, Labs, I & O's  REASON FOR ASSESSMENT:   Ventilator, NPO/Clear Liquid Diet    ASSESSMENT:   84 yo female with PMH of HTN, arthritis, ESRD-on HD, and breast cancer who was admitted on 4/3 with acute cholecystitis. S/P lap chole for severe calculous cholecystitis on 4/6. Found to have bile leak. ERCP was aborted 4/11 due to aspiration in endoscopy. S/P biloma drain placement by IR 4/11.  Discussed patient in ICU rounds and with RN today. Dark, bilious output from biloma drain today. HD cath inserted today. Patient is currently intubated on ventilator support MV: 8.5 L/min Temp (24hrs), Avg:97.7 F (36.5 C), Min:97.3 F (36.3 C), Max:98.2 F (36.8 C)   Labs reviewed. Phosphorus 4.8 (slightly elevated) CBG's: [id number] Medications reviewed and include Colace.  Patient with moderate malnutrition, given NPO / Clear liquid diet since admission (8 days) and mild depletion of muscle mass. Remains NPO, NGT output 100 ml this morning.   NUTRITION - FOCUSED PHYSICAL EXAM:    Most Recent Value  Orbital Region  No depletion  Upper Arm Region  No depletion  Thoracic and Lumbar Region  Unable to assess  Buccal Region  Unable to assess  Temple Region  Mild depletion  Clavicle Bone Region  Mild depletion  Clavicle and Acromion Bone Region   No depletion  Scapular Bone Region  Unable to assess  Dorsal Hand  Unable to assess  Patellar Region  No depletion  Anterior Thigh Region  Mild depletion  Posterior Calf Region  Mild depletion  Edema (RD Assessment)  Mild  Hair  Reviewed  Eyes  Reviewed  Mouth  Unable to assess  Skin  Reviewed  Nails  Unable to assess       Diet Order:  Diet NPO time specified  EDUCATION NEEDS:   No education needs have been identified at this time  Skin:  Skin Assessment: Skin Integrity Issues: Skin Integrity Issues:: Incisions Incisions: abdomen, breast  Last BM:  4/11  Height:   Ht Readings from Last 1 Encounters:  09/08/2017 5\' 2"  (1.575 m)    Weight:   Wt Readings from Last 1 Encounters:  08/30/17 135 lb 12.9 oz (61.6 kg)    Ideal Body Weight:  50 kg  BMI:  Body mass index is 24.84 kg/m.  Estimated Nutritional Needs:   Kcal:  1225  Protein:  80-95 gm  Fluid:  1.3-1.5 L    Molli Barrows, RD, LDN, [place] Pager [phone] After Hours Pager [phone]

## 2017-08-30 NOTE — Progress Notes (Signed)
Pharmacy Antibiotic Note  ILO Pam Fuller is a 84 y.o. female admitted on 08/28/2017 with concern for aspiration PNA during anesthesia for ERCP.  Pharmacy has been consulted for Unasyn dosing, now to broaden to Zosyn with worsening pressor requirement. Pt is ESRD on HD MWF with last HD 4/10. Per nephrology, likely to transition to CVVHD over the weekend.  Plan: -Stop Unasyn -Begin Zosyn 3.375g IV EI q12h for now -Monitor transition to CVVHD and need to adjust dosing  Height: 5\' 2"  (157.5 cm) Weight: 135 lb 12.9 oz (61.6 kg) IBW/kg (Calculated) : 50.1  Temp (24hrs), Avg:97.6 F (36.4 C), Min:96.5 F (35.8 C), Max:98.2 F (36.8 C)  Recent Labs  Lab 08/27/17 0921 08/28/17 0522 09/17/2017 0630 08/23/2017 1236 08/30/17 0454  WBC 12.7* 11.8* 16.8* 9.3 18.5*  CREATININE 4.54* 6.08* 3.50* 3.75* 4.50*  LATICACIDVEN  --   --   --   --  2.1*    Estimated Creatinine Clearance: 8.3 mL/min (A) (by C-G formula based on SCr of 4.5 mg/dL (H)).    Allergies  Allergen Reactions  . Erythromycin Nausea And Vomiting  . Vicodin [Hydrocodone-Acetaminophen]     unknown    Antimicrobials this admission: 4/11 Unasyn x1 4/12 Zosyn >>  Dose adjustments this admission: none  Microbiology results: 4/11 MRSA: negative 4/11 Trach aspirate: NGTD 4/11 Bile Cx: NGTD  Thank you for allowing pharmacy to be a part of this patient's care.  Arrie Senate, PharmD, BCPS PGY-2 Cardiology Pharmacy Resident Pager: [phone] 08/30/2017

## 2017-08-30 NOTE — Progress Notes (Signed)
Referring Physician(s): Dr. Rosendo Gros  Supervising Physician: Corrie Mckusick  Patient Status:  [facility] - In-pt  Chief Complaint: Bile leak  Subjective: Intubated.  Open eyes, but does not follow commands.   Allergies: Erythromycin and Vicodin [hydrocodone-acetaminophen]  Medications: Prior to Admission medications   Medication Sig Start Date End Date Taking? Authorizing Provider  amLODipine (NORVASC) 10 MG tablet Take 10 mg by mouth every evening.  03/31/13  Yes [provider]  calcium acetate (PHOSLO) 667 MG capsule Take 2 capsules (1,334 mg total) by mouth 3 (three) times daily with meals. 09/15/15  Yes Dana Allan I, MD  cloNIDine (CATAPRES) 0.1 MG tablet Take 0.1 mg by mouth every evening.  03/24/13  Yes [provider]  fluticasone (FLONASE) 50 MCG/ACT nasal spray Place 2 sprays into both nostrils daily. 09/15/15  Yes Dana Allan I, MD  hydrALAZINE (APRESOLINE) 50 MG tablet Take 50 mg by mouth every evening.  04/14/13  Yes [provider]  multivitamin (RENA-VIT) TABS tablet Take 1 tablet by mouth at bedtime. 09/15/15  Yes Bonnell Public, MD  tamoxifen (NOLVADEX) 20 MG tablet Take 1 tablet (20 mg total) by mouth daily. Patient taking differently: Take 20 mg by mouth every evening.  02/12/17  Yes Causey, Charlestine Massed, NP  hydrOXYzine (ATARAX/VISTARIL) 25 MG tablet Take 1 tablet (25 mg total) by mouth every 8 (eight) hours as needed for itching. Patient not taking: Reported on 08/22/2017 09/15/15   Dana Allan I, MD     Vital Signs: BP (!) 120/57   Pulse 86   Temp 98.2 F (36.8 C) (Oral)   Resp 18   Ht 5\' 2"  (1.575 m)   Wt 135 lb 12.9 oz (61.6 kg)   SpO2 99%   BMI 24.84 kg/m   Physical Exam  Nursing note and vitals reviewed. NAD, open eyes Intubated Abdomen:  RUQ drain in place.  Insertion site c/d/i.  Dark bilious output in collection bag with approximately 1L drained so far.   Imaging: Ct Abdomen Pelvis Wo  Contrast  Result Date: 08/26/2017 CLINICAL DATA:  Altered mental status and abdominal pain. Status post cholecystectomy 09/02/2017 EXAM: CT ABDOMEN AND PELVIS WITHOUT CONTRAST TECHNIQUE: Multidetector CT imaging of the abdomen and pelvis was performed following the standard protocol without IV contrast. COMPARISON:  CT scan 09/05/2017 FINDINGS: Lower chest: There are small bilateral pleural effusions and bibasilar atelectasis, right greater than left. The heart is mildly enlarged but stable. Stable coronary artery calcifications. Oral contrast is noted in a slightly dilated esophagus suggesting reflux. Hepatobiliary: No focal hepatic lesions or intrahepatic biliary dilatation. Surgical clips noted from recent cholecystectomy. There is some type of surgical packing (Surgicel) in the gallbladder fossa. Moderate fluid surrounding the liver along with free intraperitoneal air, not unexpected. Small amount of fluid in the pericolic gutters and small to moderate amount of layering fluid and blood in the pelvis. No common bile duct dilatation. Pancreas: No mass, inflammation or ductal dilatation. Moderate pancreatic atrophy. Spleen: Normal size.  No focal lesions. Adrenals/Urinary Tract: The adrenal glands are unremarkable. The kidneys are quite small. Bilateral cysts are again noted. Stomach/Bowel: The stomach is well distended with contrast. No focal abnormalities. The duodenal bulb and C-loop appear normal. The small bowel and colon are grossly normal. No findings for leaking oral contrast to suggest a bowel injury. No obstructive findings. Diffuse colonic diverticulosis without findings for acute diverticulitis. Vascular/Lymphatic: Stable atherosclerotic calcifications involving the aorta and branch vessels. No mesenteric or retroperitoneal mass or adenopathy. Reproductive:  The uterus is surgically absent. Both ovaries are still present and appear normal. Other: Surgical changes involving the anterior abdominal wall.  No hematoma or abscess. Musculoskeletal: No significant bony findings. IMPRESSION: 1. Small to moderate of fluid in the abdomen pelvis with some layering blood in the lower pelvis. This is probably normal postoperative change. Could not exclude the possibility of a bile leak. Nuclear medicine hepatobiliary scan may be helpful if patient does not improve. 2. No findings for bowel injury or bowel obstruction. 3. Small bilateral pleural effusions and bibasilar atelectasis. Electronically Signed   By: Marijo Sanes M.D.   On: 08/26/2017 17:48   Ct Head Wo Contrast  Result Date: 08/26/2017 CLINICAL DATA:  Altered mental status post cholecystectomy, history end-stage renal disease, breast cancer, hypertension EXAM: CT HEAD WITHOUT CONTRAST TECHNIQUE: Contiguous axial images were obtained from the base of the skull through the vertex without intravenous contrast. Sagittal and coronal MPR images reconstructed from axial data set. COMPARISON:  09/13/2015 FINDINGS: Brain: Generalized atrophy. Normal ventricular morphology. No midline shift or mass effect. Small vessel chronic ischemic changes of deep cerebral white matter. No intracranial hemorrhage, mass lesion, evidence of acute infarction, or extra-axial fluid collection. Vascular: Atherosclerotic calcifications of internal carotid arteries at skull base. No hyperdense vessels. Skull: Intact Sinuses/Orbits: Clear Other: N/A IMPRESSION: Atrophy with small vessel chronic ischemic changes of deep cerebral white matter. No acute intracranial abnormalities. Electronically Signed   By: Lavonia Dana M.D.   On: 08/26/2017 17:31   Nm Hepatobiliary Liver Func  Result Date: 08/27/2017 CLINICAL DATA:  Cholecystectomy. Persistent abdominal pain and nausea. EXAM: NUCLEAR MEDICINE HEPATOBILIARY IMAGING TECHNIQUE: Sequential images of the abdomen were obtained out to 60 minutes following intravenous administration of radiopharmaceutical. RADIOPHARMACEUTICALS:  5.15 mCi Tc-83m   Choletec IV COMPARISON:  CT 08/26/2017. FINDINGS: Liver appears normal. Pulling of radiopharmaceutical noted about the medial aspect of the liver and within previous identified peroneal fluid. These findings suggest bile leak. IMPRESSION: Findings suggesting bile leak. Electronically Signed   By: Marcello Moores  Register   On: 08/27/2017 16:06   Ir US Guide Bx Asp/drain  Result Date: 09/11/2017 INDICATION: 84 year old with cholecystectomy and postoperative bile leak. Plan for drain placement within the abdominal fluid. EXAM: ULTRASOUND-GUIDED PLACEMENT OF DRAINAGE CATHETER IN THE RIGHT ABDOMINAL FLUID MEDICATIONS: None ANESTHESIA/SEDATION: None COMPLICATIONS: None immediate. PROCEDURE: Informed written consent was obtained from the patient's family after a thorough discussion of the procedural risks, benefits and alternatives. All questions were addressed. Maximal Sterile Barrier Technique was utilized including caps, mask, sterile gowns, sterile gloves, sterile drape, hand hygiene and skin antiseptic. A timeout was performed prior to the initiation of the procedure. Ultrasound demonstrated free fluid along the right lateral abdomen and adjacent to the inferior right hepatic lobe. Large amount of floating bowel in this area. The right side of the abdomen was prepped and draped in sterile fashion. Skin and soft tissues were anesthetized with 1% lidocaine. Yueh catheter was directed into the right abdominal fluid with ultrasound guidance. Dark brown fluid was aspirated. Stiff Amplatz wire was advanced into the abdomen and the tract was dilated to accommodate a 10 [place] multipurpose drain. Sample of fluid was sent for culture. Drain was attached to a gravity bag and sutured to skin. FINDINGS: Free fluid in the right lateral abdomen. No significant fluid around the liver. Drainage catheter was placed within this free fluid. Dark brown fluid was aspirated and most compatible with bile. IMPRESSION: Ultrasound-guided  placement of a drainage catheter in the right lateral  abdomen. Electronically Signed   By: Markus Daft M.D.   On: 09/06/2017 18:31   Dg Chest Port 1 View  Result Date: 08/30/2017 CLINICAL DATA:  84 year old female postop day 6 from cholecystectomy. Postoperative bilious emesis and hypotension requiring intubation and pressor support. EXAM: PORTABLE CHEST 1 VIEW COMPARISON:  08/27/2017 and earlier. FINDINGS: Portable AP semi upright view at 0452 hours. It appears the patient has been extubated, no definite ET tube identified. The enteric tube remains in place. Stable lung volumes, but confluent airspace opacity throughout the right lung has progressed in the upper and peripheral lung since yesterday. Patchy and confluent left lung base opacity and perihilar persists and is stable. No pneumothorax or pleural effusion. Stable cardiac size and mediastinal contours. Calcified aortic atherosclerosis. Paucity of bowel gas in the upper abdomen. IMPRESSION: 1. It appears the patient has been extubated, no definite ET tube identified. 2. Stable enteric tube coursing to the abdomen. 3. Progressed and widespread abnormal right lung opacity, as well as left lung base and perihilar opacity. The appearance is suspicious for aspiration and pneumonia in this clinical setting. Electronically Signed   By: Genevie Ann M.D.   On: 08/30/2017 07:34   Dg Chest Port 1 View  Result Date: 09/08/2017 CLINICAL DATA:  ET tube placement EXAM: PORTABLE CHEST 1 VIEW COMPARISON:  09/13/2015 FINDINGS: Endotracheal tube is 2.6 cm above the carina. NG tube is in the stomach. Diffuse right lung airspace disease and airspace disease in the left lower lung. No visible effusions or pneumothorax. Heart is upper limits normal in size. IMPRESSION: New diffuse right lung and left lower lobe airspace disease. Endotracheal tube 2.6 cm above the carina. Electronically Signed   By: Rolm Baptise M.D.   On: 08/28/2017 10:21   Dg Abd Portable 1v  Result Date:  08/27/2017 CLINICAL DATA:  Evaluate gastric tube placement EXAM: PORTABLE ABDOMEN - 1 VIEW COMPARISON:  None. FINDINGS: The bowel gas pattern is normal. The tip and side port of a gastric tube are seen in the body of the stomach. Enteric contrast is noted within small and large bowel loops without obstruction. Heart is enlarged with bibasilar streaky parenchymal opacities compatible with atelectasis or scarring. Surgical clips are seen in the right upper quadrant of the abdomen and also projecting over the left breast. No acute osseous abnormality. IMPRESSION: 1. Gastric tube noted in the body of the stomach. 2. Nonobstructed, nondistended bowel gas pattern contrast noted within. 3. Cardiomegaly with bibasilar atelectasis and/or scarring. Electronically Signed   By: Ashley Royalty M.D.   On: 08/27/2017 23:36   Dg Abd Portable 1v  Result Date: 08/27/2017 CLINICAL DATA:  Postop from laparoscopic cholecystectomy. Persistent vomiting. EXAM: PORTABLE ABDOMEN - 1 VIEW COMPARISON:  CT on 08/26/2017 FINDINGS: Oral contrast from recent CT is seen throughout the colon which is nondilated. No evidence of dilated small bowel loops. Right upper quadrant surgical clips are seen from cholecystectomy. IMPRESSION: Normal bowel gas pattern. Electronically Signed   By: Earle Gell M.D.   On: 08/27/2017 09:13    Labs:  CBC: Recent Labs    08/28/17 0522 08/26/2017 0630 08/19/2017 1236 08/30/17 0454  WBC 11.8* 16.8* 9.3 18.5*  HGB 8.2* 7.5* 7.6* 8.1*  HCT 24.5* 22.3* 22.6* 24.1*  PLT 357 434* 318 320    COAGS: Recent Labs    08/28/17 1258  INR 1.53    BMP: Recent Labs    08/28/17 0522 08/25/2017 0630 09/02/2017 1236 08/30/17 0454  NA 133* 139 140 140  K 3.7 3.2* 3.4* 4.1  CL 87* 94* 98* 100*  CO2 26 29 28 23   GLUCOSE 147* 94 94 75  BUN 38* 14 20 29*  CALCIUM 8.7* 9.0 8.5* 8.6*  CREATININE 6.08* 3.50* 3.75* 4.50*  GFRNONAA 6* 11* 10* 8*  GFRAA 7* 13* 12* 10*    LIVER FUNCTION TESTS: Recent Labs     08/26/17 0732 08/27/17 0921 08/28/17 0522 09/12/2017 0630 08/30/17 0454  BILITOT 1.9* 2.4* 2.5* 3.2*  --   AST 35 45* 28 22  --   ALT 40 36 28 23  --   ALKPHOS 147* 176* 177* 197*  --   PROT 6.6 6.5 5.8* 6.6  --   ALBUMIN 2.2* 2.2* 1.8* 2.5* 2.1*    Assessment and Plan: Acute Cholecystitis s/p lap chole 08/28/2017, now with bile leak ERCP was planned for yesterday, however she aspirated and became hypotensive in endoscopy prior to procedure.   She remains intubated. She was able to undergo biloma drain placement yesterday afternoon.  Drain remains intact today with dark bilious output.  Continue drain care.  IR to follow.   Electronically Signed: Docia Barrier, PA 08/30/2017, 9:42 AM   I spent a total of 15 Minutes at the the patient's bedside AND on the patient's hospital floor or unit, greater than 50% of which was counseling/coordinating care for bile leak.

## 2017-08-30 NOTE — Progress Notes (Signed)
[facility] Progress Note   Subjective:   Events noted- requiring escalating doses of pressors overnight, elevated troponin Objective Vitals:   08/30/17 0600 08/30/17 0700 08/30/17 0718 08/30/17 0750  BP: (!) 128/58 (!) 107/47  (!) 120/57  Pulse: (!) 102 79  86  Resp: _0 Temp:   98.2 F (36.8 C)   TempSrc:   Oral   SpO2: 98% 100%  99%  Weight:      Height:       Physical Exam General:sedated on vent- eyes open but will not follow commands Heart:tachycardia, RR, 2/6 systolic murmur. No rub or gallop Lungs:CTAB  Abdomen:soft, NTND Extremities:no edema b/l Dialysis Access: LU AVG +b/t   Filed Weights   08/28/17 1840 09/12/2017 0732 08/30/17 0500  Weight: 61.9 kg (136 lb 7.4 oz) 61.7 kg (136 lb) 61.6 kg (135 lb 12.9 oz)    Intake/Output Summary (Last 24 hours) at 08/30/2017 0818 Last data filed at 08/30/2017 0710 Gross per 24 hour  Intake 4224.63 ml  Output 1450 ml  Net 2774.63 ml    Additional Objective Labs: Basic Metabolic Panel: Recent Labs  Lab 08/23/17 1438  09/13/2017 0630 09/03/2017 1236 08/30/17 0454  NA 136   < > 139 140 140  K 4.1   < > 3.2* 3.4* 4.1  CL 94*   < > 94* 98* 100*  CO2 28   < > _1 GLUCOSE 90   < > 94 94 75  BUN 11   < > 14 20 29*  CREATININE 3.63*   < > 3.50* 3.75* 4.50*  CALCIUM 8.2*   < > 9.0 8.5* 8.6*  PHOS 3.0  --   --   --  4.8*   < > = values in this interval not displayed.   Liver Function Tests: Recent Labs  Lab 08/27/17 0921 08/28/17 0522 08/23/2017 0630 08/30/17 0454  AST 45* 28 22  --   ALT 36 28 23  --   ALKPHOS 176* 177* 197*  --   BILITOT 2.4* 2.5* 3.2*  --   PROT 6.5 5.8* 6.6  --   ALBUMIN 2.2* 1.8* 2.5* 2.1*   No results for input(s): LIPASE, AMYLASE in the last 168 hours. CBC: Recent Labs  Lab 08/27/17 0921 08/28/17 0522 09/09/2017 0630 09/03/2017 1236 08/30/17 0454  WBC 12.7* 11.8* 16.8* 9.3 18.5*  HGB 8.1* 8.2* 7.5* 7.6* 8.1*  HCT 25.3* 24.5* 22.3* 22.6* 24.1*  MCV 90.7 88.8  87.5 88.6 89.3  PLT 320 357 434* 318 320   Blood Culture    Component Value Date/Time   SDES BILE 09/16/2017 1706   SPECREQUEST NONE 09/16/2017 1706   CULT PENDING 09/03/2017 0925   REPTSTATUS 08/23/2017 FINAL 09/02/2017 1706    CBG: Recent Labs  Lab 09/04/2017 1214 08/19/2017 1753 08/30/2017 2346 08/30/17 0031 08/30/17 0420  GLUCAP 90 94 68 108* 84    Lab Results  Component Value Date   INR 1.53 08/28/2017   INR 1.34 04/10/2010   INR 1.13 04/03/2010   Studies/Results: Ir US Guide Bx Asp/drain  Result Date: 09/05/2017 INDICATION: 84 year old with cholecystectomy and postoperative bile leak. Plan for drain placement within the abdominal fluid. EXAM: ULTRASOUND-GUIDED PLACEMENT OF DRAINAGE CATHETER IN THE RIGHT ABDOMINAL FLUID MEDICATIONS: None ANESTHESIA/SEDATION: None COMPLICATIONS: None immediate. PROCEDURE: Informed written consent was obtained from the patient's family after a thorough discussion of the procedural risks, benefits and alternatives. All questions were addressed. Maximal Sterile Barrier Technique was utilized including  caps, mask, sterile gowns, sterile gloves, sterile drape, hand hygiene and skin antiseptic. A timeout was performed prior to the initiation of the procedure. Ultrasound demonstrated free fluid along the right lateral abdomen and adjacent to the inferior right hepatic lobe. Large amount of floating bowel in this area. The right side of the abdomen was prepped and draped in sterile fashion. Skin and soft tissues were anesthetized with 1% lidocaine. Yueh catheter was directed into the right abdominal fluid with ultrasound guidance. Dark brown fluid was aspirated. Stiff Amplatz wire was advanced into the abdomen and the tract was dilated to accommodate a 10 [place] multipurpose drain. Sample of fluid was sent for culture. Drain was attached to a gravity bag and sutured to skin. FINDINGS: Free fluid in the right lateral abdomen. No significant fluid around the  liver. Drainage catheter was placed within this free fluid. Dark brown fluid was aspirated and most compatible with bile. IMPRESSION: Ultrasound-guided placement of a drainage catheter in the right lateral abdomen. Electronically Signed   By: Markus Daft M.D.   On: 09/04/2017 18:31   Dg Chest Port 1 View  Result Date: 08/30/2017 CLINICAL DATA:  84 year old female postop day 6 from cholecystectomy. Postoperative bilious emesis and hypotension requiring intubation and pressor support. EXAM: PORTABLE CHEST 1 VIEW COMPARISON:  08/26/2017 and earlier. FINDINGS: Portable AP semi upright view at 0452 hours. It appears the patient has been extubated, no definite ET tube identified. The enteric tube remains in place. Stable lung volumes, but confluent airspace opacity throughout the right lung has progressed in the upper and peripheral lung since yesterday. Patchy and confluent left lung base opacity and perihilar persists and is stable. No pneumothorax or pleural effusion. Stable cardiac size and mediastinal contours. Calcified aortic atherosclerosis. Paucity of bowel gas in the upper abdomen. IMPRESSION: 1. It appears the patient has been extubated, no definite ET tube identified. 2. Stable enteric tube coursing to the abdomen. 3. Progressed and widespread abnormal right lung opacity, as well as left lung base and perihilar opacity. The appearance is suspicious for aspiration and pneumonia in this clinical setting. Electronically Signed   By: Genevie Ann M.D.   On: 08/30/2017 07:34   Dg Chest Port 1 View  Result Date: 09/06/2017 CLINICAL DATA:  ET tube placement EXAM: PORTABLE CHEST 1 VIEW COMPARISON:  09/13/2015 FINDINGS: Endotracheal tube is 2.6 cm above the carina. NG tube is in the stomach. Diffuse right lung airspace disease and airspace disease in the left lower lung. No visible effusions or pneumothorax. Heart is upper limits normal in size. IMPRESSION: New diffuse right lung and left lower lobe airspace disease.  Endotracheal tube 2.6 cm above the carina. Electronically Signed   By: Rolm Baptise M.D.   On: 08/26/2017 10:21    Medications: . ampicillin-sulbactam (UNASYN) IV    . fentaNYL infusion INTRAVENOUS 250 mcg/hr (08/30/17 0327)  . heparin Stopped (08/30/17 0506)  . phenylephrine (NEO-SYNEPHRINE) Adult infusion 170 mcg/min (08/30/17 0639)   . chlorhexidine gluconate (MEDLINE KIT)  15 mL Mouth Rinse BID  . doxercalciferol  2 mcg Intravenous Q M,W,F-HD  . mouth rinse  15 mL Mouth Rinse 10 times per day  . pantoprazole (PROTONIX) IV  40 mg Intravenous Daily  . sodium chloride flush  10-40 mL Intracatheter Q12H    Dialysis Orders: [facility] 3h 51mn 400/A1.5 61.5kg 2/2.25 bath P4 AVG LUE Hep 4000 - Hectoral 281m IV q HD - Mircera 3011mIV q 2 weeks (last 3/27)   Assessment/Plan: 1. Acute  cholecystitis - s/p lap chole 4/6 Dr. Redmond Pulling. Post op pain-    CT scan showed fluid in abd, possible bile duck leak identified on HIDA scan, GI consulting- were going to do ERCP 4/11 but pt aspirated and required intubation pre procedure so procedure was aborted- on unasyn 2. ESRD - tolerated HD earlier. Last was Wed 4/10- would next be due today but still unstable and no need so will hold and re eval tomorrow- may need CRRT ?  3. Anemia of CKD- Hgb low. Aranesp 167mg given 4/8. Follow trend- may need transfusion 4. Secondary hyperparathyroidism - Ca ok. Holding binders while patient not eating. Continue VDRA.  5. HTN/volume - BP low requiring pressors- does not appear too volume overloaded but the more days without HD she willl get 6. Nutrition - Alb 1.8- this is an issue. Renal diet with fluid restrictions once advanced.  7. Positive troponin- trend- unfortunately too unstable for intervention 8. Dispo- I am concerned about prognosis here   Dainel Arcidiacono A  08/30/2017,8:18 AM    LOS: 8 days

## 2017-08-30 NOTE — Progress Notes (Signed)
  Echocardiogram 2D Echocardiogram has been performed.  Jeiden Daughtridge G Pratham Cassatt 08/30/2017, 11:21 AM

## 2017-08-31 ENCOUNTER — Encounter (HOSPITAL_COMMUNITY): Payer: Self-pay

## 2017-08-31 ENCOUNTER — Inpatient Hospital Stay (HOSPITAL_COMMUNITY): Payer: Medicare Other

## 2017-08-31 DIAGNOSIS — I248 Other forms of acute ischemic heart disease: Secondary | ICD-10-CM

## 2017-08-31 LAB — POCT I-STAT 3, ART BLOOD GAS (G3+)
Acid-base deficit: 1 mmol/L (ref 0.0–2.0)
BICARBONATE: 26.1 mmol/L (ref 20.0–28.0)
O2 Saturation: 98 %
PCO2 ART: 55.8 mmHg — AB (ref 32.0–48.0)
PO2 ART: 126 mmHg — AB (ref 83.0–108.0)
Patient temperature: 97.5
TCO2: 28 mmol/L (ref 22–32)
pH, Arterial: 7.275 — ABNORMAL LOW (ref 7.350–7.450)

## 2017-08-31 LAB — HEPATIC FUNCTION PANEL
ALK PHOS: 253 U/L — AB (ref 38–126)
ALT: 912 U/L — ABNORMAL HIGH (ref 14–54)
AST: 2629 U/L — ABNORMAL HIGH (ref 15–41)
Albumin: 1.9 g/dL — ABNORMAL LOW (ref 3.5–5.0)
BILIRUBIN DIRECT: 1.8 mg/dL — AB (ref 0.1–0.5)
BILIRUBIN INDIRECT: 1.7 mg/dL — AB (ref 0.3–0.9)
BILIRUBIN TOTAL: 3.5 mg/dL — AB (ref 0.3–1.2)
TOTAL PROTEIN: 5.6 g/dL — AB (ref 6.5–8.1)

## 2017-08-31 LAB — GLUCOSE, CAPILLARY
GLUCOSE-CAPILLARY: 109 mg/dL — AB (ref 65–99)
GLUCOSE-CAPILLARY: 228 mg/dL — AB (ref 65–99)
GLUCOSE-CAPILLARY: 85 mg/dL (ref 65–99)
Glucose-Capillary: 157 mg/dL — ABNORMAL HIGH (ref 65–99)
Glucose-Capillary: 119 mg/dL — ABNORMAL HIGH (ref 65–99)

## 2017-08-31 LAB — RENAL FUNCTION PANEL
ALBUMIN: 1.7 g/dL — AB (ref 3.5–5.0)
ANION GAP: 10 (ref 5–15)
Albumin: 1.9 g/dL — ABNORMAL LOW (ref 3.5–5.0)
Anion gap: 14 (ref 5–15)
BUN: 40 mg/dL — AB (ref 6–20)
BUN: 23 mg/dL — AB (ref 6–20)
CALCIUM: 8.5 mg/dL — AB (ref 8.9–10.3)
CHLORIDE: 105 mmol/L (ref 101–111)
CO2: 21 mmol/L — AB (ref 22–32)
CO2: 22 mmol/L (ref 22–32)
CREATININE: 4.96 mg/dL — AB (ref 0.44–1.00)
Calcium: 7.9 mg/dL — ABNORMAL LOW (ref 8.9–10.3)
Chloride: 100 mmol/L — ABNORMAL LOW (ref 101–111)
Creatinine, Ser: 3 mg/dL — ABNORMAL HIGH (ref 0.44–1.00)
GFR calc Af Amer: 16 mL/min — ABNORMAL LOW (ref >60)
GFR calc non Af Amer: 7 mL/min — ABNORMAL LOW (ref >60)
GFR, EST AFRICAN AMERICAN: 9 mL/min — AB (ref >60)
GFR, EST NON AFRICAN AMERICAN: 14 mL/min — AB (ref >60)
GLUCOSE: 143 mg/dL — AB (ref 65–99)
Glucose, Bld: 350 mg/dL — ABNORMAL HIGH (ref 65–99)
POTASSIUM: 3.7 mmol/L (ref 3.5–5.1)
Phosphorus: 5.4 mg/dL — ABNORMAL HIGH (ref 2.5–4.6)
Phosphorus: 3 mg/dL (ref 2.5–4.6)
Potassium: 5 mmol/L (ref 3.5–5.1)
Sodium: 135 mmol/L (ref 135–145)
Sodium: 137 mmol/L (ref 135–145)

## 2017-08-31 LAB — CULTURE, RESPIRATORY W GRAM STAIN: Culture: NO GROWTH

## 2017-08-31 LAB — CBC
HCT: 21.8 % — ABNORMAL LOW (ref 36.0–46.0)
Hemoglobin: 7.4 g/dL — ABNORMAL LOW (ref 12.0–15.0)
MCH: 30.1 pg (ref 26.0–34.0)
MCHC: 33.9 g/dL (ref 30.0–36.0)
MCV: 88.6 fL (ref 78.0–100.0)
Platelets: 252 10*3/uL (ref 150–400)
RBC: 2.46 MIL/uL — AB (ref 3.87–5.11)
RDW: 15.9 % — ABNORMAL HIGH (ref 11.5–15.5)
WBC: 25.6 10*3/uL — AB (ref 4.0–10.5)

## 2017-08-31 LAB — LACTIC ACID, PLASMA: LACTIC ACID, VENOUS: 1.1 mmol/L (ref 0.5–1.9)

## 2017-08-31 LAB — MAGNESIUM: Magnesium: 2.2 mg/dL (ref 1.7–2.4)

## 2017-08-31 LAB — TROPONIN I: TROPONIN I: 2.86 ng/mL — AB (ref <0.03)

## 2017-08-31 LAB — CULTURE, RESPIRATORY

## 2017-08-31 MED ORDER — PRISMASOL BGK 4/2.5 32-4-2.5 MEQ/L IV SOLN
INTRAVENOUS | Status: DC
  Administered 2017-08-31 – 2017-09-05 (×10): via INTRAVENOUS_CENTRAL
  Filled 2017-08-31 (×12): qty 5000

## 2017-08-31 MED ORDER — CHLORHEXIDINE GLUCONATE CLOTH 2 % EX PADS
6.0000 | MEDICATED_PAD | Freq: Every day | CUTANEOUS | Status: DC
  Administered 2017-08-31 – 2017-09-22 (×23): 6 via TOPICAL

## 2017-08-31 MED ORDER — HEPARIN SODIUM (PORCINE) 5000 UNIT/ML IJ SOLN
5000.0000 [IU] | Freq: Two times a day (BID) | INTRAMUSCULAR | Status: DC
Start: 2017-08-31 — End: 2017-09-01
  Administered 2017-08-31 – 2017-09-01 (×3): 5000 [IU] via SUBCUTANEOUS
  Filled 2017-08-31 (×3): qty 1

## 2017-08-31 MED ORDER — DEXTROSE 5 % IV SOLN
0.0000 ug/min | INTRAVENOUS | Status: DC
  Administered 2017-08-31: 2 ug/min via INTRAVENOUS
  Administered 2017-09-01: 10 ug/min via INTRAVENOUS
  Administered 2017-09-01: 15 ug/min via INTRAVENOUS
  Administered 2017-09-02: 8 ug/min via INTRAVENOUS
  Administered 2017-09-02: 15 ug/min via INTRAVENOUS
  Administered 2017-09-02: 17.5 ug/min via INTRAVENOUS
  Administered 2017-09-02: 20 ug/min via INTRAVENOUS
  Administered 2017-09-03: 5 ug/min via INTRAVENOUS
  Filled 2017-08-31 (×7): qty 4

## 2017-08-31 MED ORDER — PIPERACILLIN-TAZOBACTAM 3.375 G IVPB 30 MIN
3.3750 g | Freq: Four times a day (QID) | INTRAVENOUS | Status: DC
  Administered 2017-08-31 – 2017-09-05 (×21): 3.375 g via INTRAVENOUS
  Filled 2017-08-31 (×23): qty 50

## 2017-08-31 MED ORDER — HEPARIN SODIUM (PORCINE) 1000 UNIT/ML DIALYSIS
1000.0000 [IU] | INTRAMUSCULAR | Status: DC | PRN
  Administered 2017-09-01: 2800 [IU] via INTRAVENOUS_CENTRAL
  Administered 2017-09-02: 3000 [IU] via INTRAVENOUS_CENTRAL
  Administered 2017-09-03: 2800 [IU] via INTRAVENOUS_CENTRAL
  Filled 2017-08-31: qty 3
  Filled 2017-08-31 (×6): qty 6

## 2017-08-31 MED ORDER — ALTEPLASE 2 MG IJ SOLR
2.0000 mg | Freq: Once | INTRAMUSCULAR | Status: AC
  Administered 2017-08-31: 2 mg
  Filled 2017-08-31: qty 2

## 2017-08-31 MED ORDER — INSULIN ASPART 100 UNIT/ML ~~LOC~~ SOLN
0.0000 [IU] | SUBCUTANEOUS | Status: DC
  Administered 2017-09-02 – 2017-09-07 (×7): 1 [IU] via SUBCUTANEOUS
  Administered 2017-09-08: 5 [IU] via SUBCUTANEOUS
  Administered 2017-09-10: 2 [IU] via SUBCUTANEOUS
  Administered 2017-09-10: 3 [IU] via SUBCUTANEOUS
  Administered 2017-09-10: 1 [IU] via SUBCUTANEOUS
  Administered 2017-09-11: 2 [IU] via SUBCUTANEOUS
  Administered 2017-09-11 (×2): 1 [IU] via SUBCUTANEOUS
  Administered 2017-09-11: 2 [IU] via SUBCUTANEOUS
  Administered 2017-09-11 – 2017-09-12 (×2): 1 [IU] via SUBCUTANEOUS
  Administered 2017-09-12: 2 [IU] via SUBCUTANEOUS
  Administered 2017-09-12: 1 [IU] via SUBCUTANEOUS
  Administered 2017-09-12 (×2): 2 [IU] via SUBCUTANEOUS
  Administered 2017-09-12: 1 [IU] via SUBCUTANEOUS
  Administered 2017-09-13: 3 [IU] via SUBCUTANEOUS
  Administered 2017-09-13: 2 [IU] via SUBCUTANEOUS
  Administered 2017-09-13: 1 [IU] via SUBCUTANEOUS
  Administered 2017-09-13: 3 [IU] via SUBCUTANEOUS
  Administered 2017-09-13: 1 [IU] via SUBCUTANEOUS
  Administered 2017-09-13: 3 [IU] via SUBCUTANEOUS
  Administered 2017-09-14 (×2): 2 [IU] via SUBCUTANEOUS
  Administered 2017-09-14: 1 [IU] via SUBCUTANEOUS
  Administered 2017-09-14: 2 [IU] via SUBCUTANEOUS
  Administered 2017-09-14 – 2017-09-15 (×2): 1 [IU] via SUBCUTANEOUS
  Administered 2017-09-15: 2 [IU] via SUBCUTANEOUS
  Administered 2017-09-15 – 2017-09-16 (×6): 1 [IU] via SUBCUTANEOUS

## 2017-08-31 MED ORDER — PRISMASOL BGK 4/2.5 32-4-2.5 MEQ/L IV SOLN
INTRAVENOUS | Status: DC
  Administered 2017-08-31 – 2017-09-05 (×10): via INTRAVENOUS_CENTRAL
  Filled 2017-08-31 (×12): qty 5000

## 2017-08-31 MED ORDER — PRISMASOL BGK 4/2.5 32-4-2.5 MEQ/L IV SOLN
INTRAVENOUS | Status: DC
  Administered 2017-08-31 – 2017-09-05 (×33): via INTRAVENOUS_CENTRAL
  Filled 2017-08-31 (×40): qty 5000

## 2017-08-31 MED ORDER — SODIUM POLYSTYRENE SULFONATE 15 GM/60ML PO SUSP: 30.0000 g | Freq: Once | ORAL | Status: DC

## 2017-08-31 MED ORDER — SODIUM POLYSTYRENE SULFONATE 15 GM/60ML PO SUSP
30.0000 g | Freq: Once | ORAL | Status: AC
  Administered 2017-08-31: 30 g
  Filled 2017-08-31: qty 120

## 2017-08-31 MED ORDER — HEPARIN (PORCINE) 2000 UNITS/L FOR CRRT
INTRAVENOUS_CENTRAL | Status: DC | PRN
  Filled 2017-08-31 (×2): qty 1000

## 2017-08-31 NOTE — Progress Notes (Signed)
Progress Note   Subjective  Pt is intubated   Objective   Vital signs in last 24 hours: Temp:  [96.4 F (35.8 C)-97.6 F (36.4 C)] 97.4 F (36.3 C) (04/13 1602) Pulse Rate:  [68-98] 77 (04/13 1645) Resp:  [16-32] 26 (04/13 1645) BP: (81-163)/(40-92) 116/50 (04/13 1645) SpO2:  [92 %-100 %] 100 % (04/13 1645) FiO2 (%):  [40 %] 40 % (04/13 1540) Last BM Date: 08/31/17 General:    intubated Heart:  Regular rate and rhythm; no murmurs Lungs: Respirations B/L decreased BS Abdomen:  Soft, BS - absent. Mildly distended Extremities:  edema. Neurologic:  intubated  Intake/Output from previous day: 04/12 0701 - 04/13 0700 In: 3673.2 [I.V.:3063.2; IV Piggyback:600] Out: 400 [Drains:400] Intake/Output this shift: Total I/O In: 571.1 [I.V.:451.1; Other:20; IV Piggyback:100] Out: 371 [Other:576]  Lab Results: Recent Labs    09/17/2017 1236 08/30/17 0454 08/31/17 0315  WBC 9.3 18.5* 25.6*  HGB 7.6* 8.1* 7.4*  HCT 22.6* 24.1* 21.8*  PLT 318 320 252   BMET Recent Labs    08/30/17 0454 08/31/17 0315 08/31/17 1552  NA 140 135 137  K 4.1 5.0 3.7  CL 100* 100* 105  CO2 23 21* 22  GLUCOSE 75 143* 350*  BUN 29* 40* 23*  CREATININE 4.50* 4.96* 3.00*  CALCIUM 8.6* 8.5* 7.9*   LFT Recent Labs    08/31/17 0315 08/31/17 1552  PROT 5.6*  --   ALBUMIN 1.9*  1.9* 1.7*  AST 2,629*  --   ALT 912*  --   ALKPHOS 253*  --   BILITOT 3.5*  --   BILIDIR 1.8*  --   IBILI 1.7*  --    PT/INR No results for input(s): LABPROT, INR in the last 72 hours.  Studies/Results: Dg Chest Port 1 View  Result Date: 08/31/2017 CLINICAL DATA:  Evaluate ETT.  Respiratory distress. EXAM: PORTABLE CHEST 1 VIEW COMPARISON:  August 30, 2017 FINDINGS: The ETT and left central line are stable. The NG tube terminates below today's film. No pneumothorax. No nodules or masses. Bilateral infiltrates, right greater than left, are stable. IMPRESSION: 1. Support apparatus as above. 2. Persistent  significant pulmonary infiltrates, right greater than left. Electronically Signed   By: Dorise Bullion III M.D   On: 08/31/2017 07:23   Dg Chest Port 1 View  Result Date: 08/30/2017 CLINICAL DATA:  LEFT-sided central line placement. EXAM: PORTABLE CHEST 1 VIEW COMPARISON:  08/30/2017. FINDINGS: Unchanged cardiomediastinal silhouette. Calcified tortuous aorta. Significant RIGHT greater than LEFT lung opacities. LEFT-sided hemodialysis catheter was placed via LEFT IJ approach. Tip lies at the cavoatrial junction. There is no pneumothorax. ET tube and nasogastric tube are unchanged. IMPRESSION: Satisfactory LEFT hemodialysis catheter placement. No pneumothorax. Stable aeration. Electronically Signed   By: Staci Righter M.D.   On: 08/30/2017 12:34   Dg Chest Port 1 View  Result Date: 08/30/2017 CLINICAL DATA:  83 year old female postop day 6 from cholecystectomy. Postoperative bilious emesis and hypotension requiring intubation and pressor support. EXAM: PORTABLE CHEST 1 VIEW COMPARISON:  08/26/2017 and earlier. FINDINGS: Portable AP semi upright view at 0452 hours. It appears the patient has been extubated, no definite ET tube identified. The enteric tube remains in place. Stable lung volumes, but confluent airspace opacity throughout the right lung has progressed in the upper and peripheral lung since yesterday. Patchy and confluent left lung base opacity and perihilar persists and is stable. No pneumothorax or pleural effusion. Stable cardiac size and mediastinal contours. Calcified aortic atherosclerosis. Paucity  of bowel gas in the upper abdomen. IMPRESSION: 1. It appears the patient has been extubated, no definite ET tube identified. 2. Stable enteric tube coursing to the abdomen. 3. Progressed and widespread abnormal right lung opacity, as well as left lung base and perihilar opacity. The appearance is suspicious for aspiration and pneumonia in this clinical setting. Electronically Signed   By: Genevie Ann  M.D.   On: 08/30/2017 07:34       Assessment / Plan:    84 year old female with ESRD on HD s/p lap chole 08/19/2017, post op bile leak 09/09/2017 s/p IR drain. Septic shock with respiratory failure and aspiration pneumonia, had non-STEMI with Nl EF but elevated troponin. Now with increasing LFTs likely due to shock liver.  Patient is currently intubated on pressors, not stable for ERCP. Had ileus gradually improving. Pt is critically ill with multiple organ failure.  Plan: Repeat bedside ultrasound abdomen  In AM to check for biloma. IR drainage less this afternoon- rule out any blockage Continue supportive care.  ERCP early next week if medically stable.  Discussed with patient's son in detail.   Carmell Austria MD

## 2017-08-31 NOTE — Progress Notes (Addendum)
Progress Note  Patient Name: Pam Fuller Date of Encounter: 08/31/2017  Primary Cardiologist: Sinclair Grooms, MD   Subjective   Intubated and sedated. Unable to obtain.   Inpatient Medications    Scheduled Meds: . aspirin  81 mg Per Tube Daily  . chlorhexidine gluconate (MEDLINE KIT)  15 mL Mouth Rinse BID  . Chlorhexidine Gluconate Cloth  6 each Topical Daily  . doxercalciferol  2 mcg Intravenous Q M,W,F-HD  . heparin injection (subcutaneous)  5,000 Units Subcutaneous Q12H  . insulin aspart  0-9 Units Subcutaneous TID WC  . mouth rinse  15 mL Mouth Rinse 10 times per day  . pantoprazole (PROTONIX) IV  40 mg Intravenous Daily  . sodium chloride flush  10-40 mL Intracatheter Q12H   Continuous Infusions: . dextrose 40 mL/hr at 08/31/17 1100  . fentaNYL infusion INTRAVENOUS 75 mcg/hr (08/31/17 1100)  . heparin    . norepinephrine (LEVOPHED) Adult infusion 5.973 mcg/min (08/31/17 0800)  . piperacillin-tazobactam Stopped (08/31/17 0930)  . dialysis replacement fluid (prismasate) 400 mL/hr at 08/31/17 0937  . dialysis replacement fluid (prismasate) 400 mL/hr at 08/31/17 0940  . dialysate (PRISMASATE) 2,000 mL/hr at 08/31/17 0939  . vasopressin (PITRESSIN) infusion - *FOR SHOCK* Stopped (08/31/17 0350)   PRN Meds: acetaminophen **OR** acetaminophen, docusate, fentaNYL, glucagon (human recombinant), heparin, heparin, midazolam, ondansetron **OR** ondansetron (ZOFRAN) IV, sodium chloride flush   Vital Signs    Vitals:   08/31/17 1000 08/31/17 1015 08/31/17 1030 08/31/17 1045  BP: (!) 125/57 113/78 115/78 124/68  Pulse: 87  85   Resp: 19 (!) 25 (!) 22 (!) 22  Temp:      TempSrc:      SpO2: 100%  100%   Weight:      Height:        Intake/Output Summary (Last 24 hours) at 08/31/2017 1110 Last data filed at 08/31/2017 1100 Gross per 24 hour  Intake 2371.8 ml  Output 375 ml  Net 1996.8 ml   Filed Weights   08/28/17 1840 09/05/2017 0732 08/30/17 0500  Weight: 136  lb 7.4 oz (61.9 kg) 136 lb (61.7 kg) 135 lb 12.9 oz (61.6 kg)    Telemetry    Sinus rhythm.  Occasional PVCs - Personally Reviewed  ECG    n/a - Personally Reviewed  Physical Exam   VS:  BP 124/68   Pulse 85   Temp (!) 97.4 F (36.3 C) (Oral)   Resp (!) 22   Ht 5' 2"  (1.575 m)   Wt 135 lb 12.9 oz (61.6 kg)   SpO2 100%   BMI 24.84 kg/m  , BMI Body mass index is 24.84 kg/m. GENERAL:  Critically ill-appearing.  Intubated and sedated.  HEENT: Pupils equal round and reactive, fundi not visualized, oral mucosa unremarkable NECK:  No jugular venous distention, waveform within normal limits, carotid upstroke brisk and symmetric, no bruits LUNGS:  Clear to auscultation bilaterally HEART:  RRR.  PMI not displaced or sustained,S1 and S2 within normal limits, no S3, no S4, no clicks, no rubs, no murmurs ABD:  Flat, positive bowel sounds normal in frequency in pitch, no bruits, no rebound, no guarding, no midline pulsatile mass, no hepatomegaly, no splenomegaly EXT:  2 plus pulses throughout, 2+ UE edema, 1+ LE edema bilaterally, no cyanosis no clubbing SKIN:  No rashes no nodules NEURO:  Cranial nerves II through XII grossly intact, motor grossly intact throughout PSYCH:  Cognitively intact, oriented to person place and time   Labs  Chemistry Recent Labs  Lab 09/15/2017 0630 09/02/2017 1236 08/30/17 0454 08/30/17 1100 08/31/17 0315  NA 139 140 140  --  135  K 3.2* 3.4* 4.1  --  5.0  CL 94* 98* 100*  --  100*  CO2 29 28 23   --  21*  GLUCOSE 94 94 75  --  143*  BUN 14 20 29*  --  40*  CREATININE 3.50* 3.75* 4.50*  --  4.96*  CALCIUM 9.0 8.5* 8.6*  --  8.5*  PROT 6.6  --   --  5.4* 5.6*  ALBUMIN 2.5*  --  2.1* 1.9* 1.9*  1.9*  AST 22  --   --  607* 2,629*  ALT 23  --   --  221* 912*  ALKPHOS 197*  --   --  201* 253*  BILITOT 3.2*  --   --  3.6* 3.5*  GFRNONAA 11* 10* 8*  --  7*  GFRAA 13* 12* 10*  --  9*  ANIONGAP 16* 14 17*  --  14     Hematology Recent Labs  Lab  08/26/2017 1236 08/30/17 0454 08/31/17 0315  WBC 9.3 18.5* 25.6*  RBC 2.55* 2.70* 2.46*  HGB 7.6* 8.1* 7.4*  HCT 22.6* 24.1* 21.8*  MCV 88.6 89.3 88.6  MCH 29.8 30.0 30.1  MCHC 33.6 33.6 33.9  RDW 15.2 15.6* 15.9*  PLT 318 320 252    Cardiac Enzymes Recent Labs  Lab 08/30/17 0057 08/30/17 0454 08/30/17 1100 08/31/17 0315  TROPONINI 3.17* 4.47* 3.62* 2.86*   No results for input(s): TROPIPOC in the last 168 hours.   BNPNo results for input(s): BNP, PROBNP in the last 168 hours.   DDimer No results for input(s): DDIMER in the last 168 hours.   Radiology    Ir US Guide Bx Asp/drain  Result Date: 09/06/2017 INDICATION: 84 year old with cholecystectomy and postoperative bile leak. Plan for drain placement within the abdominal fluid. EXAM: ULTRASOUND-GUIDED PLACEMENT OF DRAINAGE CATHETER IN THE RIGHT ABDOMINAL FLUID MEDICATIONS: None ANESTHESIA/SEDATION: None COMPLICATIONS: None immediate. PROCEDURE: Informed written consent was obtained from the patient's family after a thorough discussion of the procedural risks, benefits and alternatives. All questions were addressed. Maximal Sterile Barrier Technique was utilized including caps, mask, sterile gowns, sterile gloves, sterile drape, hand hygiene and skin antiseptic. A timeout was performed prior to the initiation of the procedure. Ultrasound demonstrated free fluid along the right lateral abdomen and adjacent to the inferior right hepatic lobe. Large amount of floating bowel in this area. The right side of the abdomen was prepped and draped in sterile fashion. Skin and soft tissues were anesthetized with 1% lidocaine. Yueh catheter was directed into the right abdominal fluid with ultrasound guidance. Dark brown fluid was aspirated. Stiff Amplatz wire was advanced into the abdomen and the tract was dilated to accommodate a 10 [place] multipurpose drain. Sample of fluid was sent for culture. Drain was attached to a gravity bag and sutured to  skin. FINDINGS: Free fluid in the right lateral abdomen. No significant fluid around the liver. Drainage catheter was placed within this free fluid. Dark brown fluid was aspirated and most compatible with bile. IMPRESSION: Ultrasound-guided placement of a drainage catheter in the right lateral abdomen. Electronically Signed   By: Markus Daft M.D.   On: 09/17/2017 18:31   Dg Chest Port 1 View  Result Date: 08/31/2017 CLINICAL DATA:  Evaluate ETT.  Respiratory distress. EXAM: PORTABLE CHEST 1 VIEW COMPARISON:  August 30, 2017 FINDINGS: The ETT  and left central line are stable. The NG tube terminates below today's film. No pneumothorax. No nodules or masses. Bilateral infiltrates, right greater than left, are stable. IMPRESSION: 1. Support apparatus as above. 2. Persistent significant pulmonary infiltrates, right greater than left. Electronically Signed   By: Dorise Bullion III M.D   On: 08/31/2017 07:23   Dg Chest Port 1 View  Result Date: 08/30/2017 CLINICAL DATA:  LEFT-sided central line placement. EXAM: PORTABLE CHEST 1 VIEW COMPARISON:  08/30/2017. FINDINGS: Unchanged cardiomediastinal silhouette. Calcified tortuous aorta. Significant RIGHT greater than LEFT lung opacities. LEFT-sided hemodialysis catheter was placed via LEFT IJ approach. Tip lies at the cavoatrial junction. There is no pneumothorax. ET tube and nasogastric tube are unchanged. IMPRESSION: Satisfactory LEFT hemodialysis catheter placement. No pneumothorax. Stable aeration. Electronically Signed   By: Staci Righter M.D.   On: 08/30/2017 12:34   Dg Chest Port 1 View  Result Date: 08/30/2017 CLINICAL DATA:  84 year old female postop day 6 from cholecystectomy. Postoperative bilious emesis and hypotension requiring intubation and pressor support. EXAM: PORTABLE CHEST 1 VIEW COMPARISON:  09/09/2017 and earlier. FINDINGS: Portable AP semi upright view at 0452 hours. It appears the patient has been extubated, no definite ET tube identified. The  enteric tube remains in place. Stable lung volumes, but confluent airspace opacity throughout the right lung has progressed in the upper and peripheral lung since yesterday. Patchy and confluent left lung base opacity and perihilar persists and is stable. No pneumothorax or pleural effusion. Stable cardiac size and mediastinal contours. Calcified aortic atherosclerosis. Paucity of bowel gas in the upper abdomen. IMPRESSION: 1. It appears the patient has been extubated, no definite ET tube identified. 2. Stable enteric tube coursing to the abdomen. 3. Progressed and widespread abnormal right lung opacity, as well as left lung base and perihilar opacity. The appearance is suspicious for aspiration and pneumonia in this clinical setting. Electronically Signed   By: Genevie Ann M.D.   On: 08/30/2017 07:34    Cardiac Studies   Echo 08/30/17: Study Conclusions  - Left ventricle: The cavity size was normal. Wall thickness was   increased in a pattern of mild LVH. There was severe focal basal   hypertrophy of the septum. Systolic function was vigorous. The   estimated ejection fraction was in the range of 65% to 70%. Wall   motion was normal; there were no regional wall motion   abnormalities. Doppler parameters are consistent with abnormal   left ventricular relaxation (grade 1 diastolic dysfunction). - Mitral valve: Calcified annulus. - Left atrium: The atrium was mildly dilated. - Pulmonary arteries: Systolic pressure was mildly increased.  Impressions:  - Vigorous LV systolic function; mild LVH with severe proximal   septal thickening creating narrow LVOT and turbulence; doppler   suboptimal but no significant LVOT gradient at rest; mild LAE;   mild TR with mild pulmonary hypertension.  Patient Profile     84 y.o. female with ESRD on HD, coronary artery calcification, hypertension, chronic anemia, breast cancer here with choledocholitiasis who underwent lap-chole 4/6 which was complicated by  bile leak and aspiration during ERCP which resulted in intubation.  Cardiology consulted for elevated troponin.    Assessment & Plan    # Demand ischemia:  # Coronary calcification: Troponin elevated to 4.47 in the setting of bile leake after ERCP and aspiration requiring intubation.  Echo shows normal systolic function.  She has known coronary calcification but no history of interventions or chest pain in the past per her son.  Continue aspirin.  Will check lipids.  She is critically ill, but could consider stress testing once stable.  Still on pressors but would add beta blocker once BP stabilizes.  No need to cycle troponin given the down trend.  # Hypertension: Home meds all on hold 2/2 sespis and hypotension. She is still requiring levophed but is off phenylephrine.   We will follow peripherally over the weekend.  Feel free to call if there are questions or concerns.   For questions or updates, please contact [place] Please consult [email or web address] for contact info under Cardiology/STEMI.      Signed, Skeet Latch, MD  08/31/2017, 11:10 AM

## 2017-08-31 NOTE — Plan of Care (Signed)
  Problem: Education: Goal: Knowledge of General Education information will improve Outcome: Not Progressing   Problem: Health Behavior/Discharge Planning: Goal: Ability to manage health-related needs will improve Outcome: Not Progressing   Problem: Clinical Measurements: Goal: Respiratory complications will improve Outcome: Not Progressing Goal: Cardiovascular complication will be avoided Outcome: Not Progressing   Problem: Safety: Goal: Ability to remain free from injury will improve Outcome: Progressing   Problem: Skin Integrity: Goal: Risk for impaired skin integrity will decrease Outcome: Not Progressing

## 2017-08-31 NOTE — Progress Notes (Signed)
[facility] Progress Note   Subjective:   On vent, coming off of pressors now  Objective Vitals:   08/31/17 1000 08/31/17 1015 08/31/17 1030 08/31/17 1045  BP: (!) 125/57 113/78 115/78 124/68  Pulse: 87  85   Resp: 19 (!) 25 (!) 22 (!) 22  Temp:      TempSrc:      SpO2: 100%  100%   Weight:      Height:       Physical Exam General:sedated on vent- eyes open but will not follow commands Heart:tachycardia, RR, 2/6 systolic murmur. No rub or gallop Lungs:CTAB  Abdomen:soft, NTND Extremities:no edema b/l LE's, 2-3+ LUE edema Dialysis Access: LU AVG +b/t , L IJ temp cath   Dialysis Orders: [facility] 3h 4mn 400/A1.5 61.5kg 2/2.25 bath P4 AVG LUE Hep 4000 - Hectoral 237m IV q HD - Mircera 3061mIV q 2 weeks (last 3/27)   Assessment/Plan: 1. Acute cholecystitis - s/p lap chole 4/6 Dr. WilRedmond Pullingost op bile duct leak sp IR biliary drain on 4/11.  2. Acute resp failure/ asp PNA - on vent, dense R sided infiltrates on CXR, septic shock. IV Zosyn 3. ^LFT's - shock liver 4. Shock - presumed septic, pressors coming down today  5. ESRD/ volume - Last was Wed 4/10. Too sick for reg HD, on CRRT now via temp L IJ cath. At dry wt, +LUE edema 6. Anemia of CKD- Hgb low. Aranesp 100m108miven 4/8. Follow trend- may need transfusion 7. Secondary hyperparathyroidism - Ca ok. Holding binders while patient not eating. Continue VDRA.  8. Nutrition - Alb 1.8- this is an issue. Renal diet with fluid restrictions once advanced.  9. Positive troponin- trend- too unstable for intervention, seen by cardiology 10. Dispo- concerned about prognosis here   Rob Kelly [facility] ([id number]/13/2019, 11:21 AM      Filed Weights   08/28/17 1840 08/26/2017 0732 08/30/17 0500  Weight: 61.9 kg (136 lb 7.4 oz) 61.7 kg (136 lb) 61.6 kg (135 lb 12.9 oz)    Intake/Output Summary (Last 24 hours) at 08/31/2017 1117 Last data filed at 08/31/2017  1100 Gross per 24 hour  Intake 2381.8 ml  Output 375 ml  Net 2006.8 ml    Additional Objective Labs: Basic Metabolic Panel: Recent Labs  Lab 09/17/2017 1236 08/30/17 0454 08/31/17 0315  NA 140 140 135  K 3.4* 4.1 5.0  CL 98* 100* 100*  CO2 28 23 21*  GLUCOSE 94 75 143*  BUN 20 29* 40*  CREATININE 3.75* 4.50* 4.96*  CALCIUM 8.5* 8.6* 8.5*  PHOS  --  4.8* 5.4*   Liver Function Tests: Recent Labs  Lab 09/03/2017 0630 08/30/17 0454 08/30/17 1100 08/31/17 0315  AST 22  --  607* 2,629*  ALT 23  --  221* 912*  ALKPHOS 197*  --  201* 253*  BILITOT 3.2*  --  3.6* 3.5*  PROT 6.6  --  5.4* 5.6*  ALBUMIN 2.5* 2.1* 1.9* 1.9*  1.9*   No results for input(s): LIPASE, AMYLASE in the last 168 hours. CBC: Recent Labs  Lab 08/28/17 0522 08/28/2017 0630 09/01/2017 1236 08/30/17 0454 08/31/17 0315  WBC 11.8* 16.8* 9.3 18.5* 25.6*  HGB 8.2* 7.5* 7.6* 8.1* 7.4*  HCT 24.5* 22.3* 22.6* 24.1* 21.8*  MCV 88.8 87.5 88.6 89.3 88.6  PLT 357 434* 318 320 252   Blood Culture    Component Value Date/Time   SDES BILE 09/03/2017 1706  SDES BILE 09/04/2017 1706   SPECREQUEST BOTTLES DRAWN AEROBIC AND ANAEROBIC 09/05/2017 1706   SPECREQUEST NONE 08/25/2017 1706   CULT  09/08/2017 1706    NO GROWTH < 24 HOURS Performed at [facility], [place] [address], [place],  [address]    REPTSTATUS PENDING 08/20/2017 1706   REPTSTATUS 08/22/2017 FINAL 08/27/2017 1706    CBG: Recent Labs  Lab 08/30/17 1603 08/30/17 1948 08/30/17 2328 08/31/17 0341 08/31/17 0723  GLUCAP 230* 224* 150* 157* 119*    Lab Results  Component Value Date   INR 1.53 08/28/2017   INR 1.34 04/10/2010   INR 1.13 04/03/2010   Studies/Results:  Medications: . dextrose 40 mL/hr at 08/31/17 1100  . fentaNYL infusion INTRAVENOUS 75 mcg/hr (08/31/17 1100)  . heparin    . norepinephrine (LEVOPHED) Adult infusion 5.973 mcg/min (08/31/17 0800)  . piperacillin-tazobactam Stopped (08/31/17 0930)  . dialysis  replacement fluid (prismasate) 400 mL/hr at 08/31/17 0937  . dialysis replacement fluid (prismasate) 400 mL/hr at 08/31/17 0940  . dialysate (PRISMASATE) 2,000 mL/hr at 08/31/17 0939  . vasopressin (PITRESSIN) infusion - *FOR SHOCK* Stopped (08/31/17 0350)   . aspirin  81 mg Per Tube Daily  . chlorhexidine gluconate (MEDLINE KIT)  15 mL Mouth Rinse BID  . Chlorhexidine Gluconate Cloth  6 each Topical Daily  . doxercalciferol  2 mcg Intravenous Q M,W,F-HD  . heparin injection (subcutaneous)  5,000 Units Subcutaneous Q12H  . insulin aspart  0-9 Units Subcutaneous TID WC  . mouth rinse  15 mL Mouth Rinse 10 times per day  . pantoprazole (PROTONIX) IV  40 mg Intravenous Daily  . sodium chloride flush  10-40 mL Intracatheter Q12H

## 2017-08-31 NOTE — Progress Notes (Addendum)
PULMONARY / CRITICAL CARE MEDICINE   Name: Pam Fuller MRN: [id number] DOB: [birth year]    ADMISSION DATE:  09/17/2017 CONSULTATION DATE:  4/11  REFERRING MD:  Dr. Maudie Mercury  CHIEF COMPLAINT:  Aspiration  BRIEF SUMMARY:   [name] is a 84 year old female with ESRD on MWF HD, HTN, Anemia, and hx of breast cancer who presented to the ED on 4/4 with RLQ abdominal pain. She was found to have acute cholecystitis with a gallstone lodged in the gallbladder neck. CBD measured up to 10 mm. She underwent laparoscopic cholecystectomy on 4/6. She initially did well but on 4/8 was noted to have worsened pain and confusion and thought to have post-op ileus. CT abd/pelvis 4/8 showed small-mod fluid in the abdominal pelvis, read as normal post op changes but could not exclude a bile leak. A hepatobiliary scan was obtained on 4/9 which did suggest a bile leak. Surgery recommended IR drain and GI consult for ERCP. She went to the Endoscopy unit on 4/11 AM for ERCP however during prep with anesthesia she had significant bilious emesis with hypotension requiring intubation and pressor support. ERCP was not attempted. She was subsequently transferred to the medical ICU.   STUDIES:  CT abd/pelv 4/4 >> acute cholecystitis, gallstone in GB neck, CBD 10 mm  CT Head: no acute changes, chronic small vessel ischemic changes  CT abd/pelvis 4/8 >> small-mod fluid in abd/pelvis  Hepatobiliary scan 4/9 >> bile leak  CXR 4/11 >> R lung airspace disease, ETT 2.6 cm above carina   CULTURES: Tracheal aspirate 4/11 >> Abd fluid 4/11 >>  ANTIBIOTICS: Zosyn 4/3 >> 4/7 Unasyn 4/11 >>  LINES/TUBES: ETT 4/11 OGT 4/11 PIV RUE 4/9 RLQ Biliary Drain 4/11  SIGNIFICANT EVENTS: 4/4 - admitted w/ acute cholecystitis 4/6 - lap chole 4/9 - bile leak seen on hepatobiliary scan 4/11 - Aspirated during prep for ERCP, intubated and transferred to ICU 4/11 - Rt abd fluid drain placed by IR 4/12- Intubated and  sedated, but opens eyes. Troponins elevated and trending up overnight. Started on Heparin gtt.    SUBJECTIVE/OVERNIGHT/INTERVAL HX 4/13 - worsening liver enzyymes. ESRD - Anuric. CRRT being planned. On levophed 99mcg and fent 114mcg - RN concerned less awake. Has needed more sedation overnight. . On vent. CCS has deferred timing of ERCP to GI.  ECHO 08/30/17 - with good EF (type 2 trop leak; doubt true NSTEMI). Son at bedside  VITAL SIGNS: BP (!) 103/49   Pulse 88   Temp (!) 97.4 F (36.3 C) (Oral)   Resp 18   Ht 5\' 2"  (1.575 m)   Wt 61.6 kg (135 lb 12.9 oz)   SpO2 100%   BMI 24.84 kg/m   HEMODYNAMICS: CVP:  [4 mmHg] 4 mmHg  VENTILATOR SETTINGS: Vent Mode: PRVC FiO2 (%):  [40 %] 40 % Set Rate:  [18 bmp] 18 bmp Vt Set:  [400 mL] 400 mL PEEP:  [5 cmH20] 5 cmH20 Plateau Pressure:  [21 cmH20-22 cmH20] 21 cmH20  INTAKE / OUTPUT: I/O last 3 completed shifts: In: 6277.8 [I.V.:5667.8; Other:10; IV Piggyback:600] Out: 1050 [Emesis/NG output:100; Drains:950]  PHYSICAL EXAMINATION:  General Appearance:    Looks criticall ill  Head:    Normocephalic, without obvious abnormality, atraumatic  Eyes:    PERRL - yes, conjunctiva/corneas - muddy and eyes open      Ears:    Normal external ear canals, both ears  Nose:   NG tube - no  Throat:  ETT TUBE -  yes , OG tube - yes but no TF  Neck:   Supple,  No enlargement/tenderness/nodules     Lungs:     Clear to auscultation bilaterally, Ventilator   Synchrony - yes , 40% fio2  Chest wall:    No deformity  Heart:    S1 and S2 normal, no murmur, CVP - na.  Pressors - yes levophed 33mcg  Abdomen:     Soft, no masses, no organomegaly  Genitalia:    Not done  Rectal:   not done  Extremities:   Extremities- intact     Skin:   Intact in exposed areas .     Neurologic:   Sedation - fent 161mcg gtt -> RASS - -3 . Moves all 4s - weakly yes per RN. CAM-ICU - na . Orientation - na   PULMONARY Recent Labs  Lab 08/30/2017 1112 08/30/17 0252  08/31/17 0252  PHART 7.398 7.369 7.275*  PCO2ART 53.1* 53.7* 55.8*  PO2ART 292.0* 89.0 126.0*  HCO3 32.7* 31.0* 26.1  TCO2 34* 33* 28  O2SAT 100.0 96.0 98.0    CBC Recent Labs  Lab 09/08/2017 1236 08/30/17 0454 08/31/17 0315  HGB 7.6* 8.1* 7.4*  HCT 22.6* 24.1* 21.8*  WBC 9.3 18.5* 25.6*  PLT 318 320 252    COAGULATION Recent Labs  Lab 08/28/17 1258  INR 1.53    CARDIAC   Recent Labs  Lab 09/12/2017 1708 08/30/17 0057 08/30/17 0454 08/30/17 1100 08/31/17 0315  TROPONINI 1.40* 3.17* 4.47* 3.62* 2.86*   No results for input(s): PROBNP in the last 168 hours.   CHEMISTRY Recent Labs  Lab 08/28/17 0522 08/26/2017 0630 09/04/2017 1236 08/30/17 0454 08/31/17 0315  NA 133* 139 140 140 135  K 3.7 3.2* 3.4* 4.1 5.0  CL 87* 94* 98* 100* 100*  CO2 26 29 28 23  21*  GLUCOSE 147* 94 94 75 143*  BUN 38* 14 20 29* 40*  CREATININE 6.08* 3.50* 3.75* 4.50* 4.96*  CALCIUM 8.7* 9.0 8.5* 8.6* 8.5*  MG  --   --   --  2.1 2.2  PHOS  --   --   --  4.8* 5.4*   Estimated Creatinine Clearance: 7.6 mL/min (A) (by C-G formula based on SCr of 4.96 mg/dL (H)).  I/O last 3 completed shifts: In: 6277.8 [I.V.:5667.8; Other:10; IV Piggyback:600] Out: 1050 [Emesis/NG output:100; Drains:950]   Intake/Output Summary (Last 24 hours) at 08/31/2017 0900 Last data filed at 08/31/2017 0800 Gross per 24 hour  Intake 2671.26 ml  Output 375 ml  Net 2296.26 ml     LIVER Recent Labs  Lab 08/27/17 0921 08/28/17 0522 08/28/17 1258 08/23/2017 0630 08/30/17 0454 08/30/17 1100 08/31/17 0315  AST 45* 28  --  22  --  607* 2,629*  ALT 36 28  --  23  --  221* 912*  ALKPHOS 176* 177*  --  197*  --  201* 253*  BILITOT 2.4* 2.5*  --  3.2*  --  3.6* 3.5*  PROT 6.5 5.8*  --  6.6  --  5.4* 5.6*  ALBUMIN 2.2* 1.8*  --  2.5* 2.1* 1.9* 1.9*  1.9*  INR  --   --  1.53  --   --   --   --      INFECTIOUS Recent Labs  Lab 08/30/17 0454 08/30/17 1100 08/30/17 1501  LATICACIDVEN 2.1* 5.6* 4.7*      ENDOCRINE CBG (last 3)  Recent Labs    08/30/17 2328 08/31/17 0341 08/31/17 3428  GLUCAP 150* 157* 119*         IMAGING x48h  - image(s) personally visualized  -   highlighted in bold Ir US Guide Bx Asp/drain  Result Date: 09/08/2017 INDICATION: 84 year old with cholecystectomy and postoperative bile leak. Plan for drain placement within the abdominal fluid. EXAM: ULTRASOUND-GUIDED PLACEMENT OF DRAINAGE CATHETER IN THE RIGHT ABDOMINAL FLUID MEDICATIONS: None ANESTHESIA/SEDATION: None COMPLICATIONS: None immediate. PROCEDURE: Informed written consent was obtained from the patient's family after a thorough discussion of the procedural risks, benefits and alternatives. All questions were addressed. Maximal Sterile Barrier Technique was utilized including caps, mask, sterile gowns, sterile gloves, sterile drape, hand hygiene and skin antiseptic. A timeout was performed prior to the initiation of the procedure. Ultrasound demonstrated free fluid along the right lateral abdomen and adjacent to the inferior right hepatic lobe. Large amount of floating bowel in this area. The right side of the abdomen was prepped and draped in sterile fashion. Skin and soft tissues were anesthetized with 1% lidocaine. Yueh catheter was directed into the right abdominal fluid with ultrasound guidance. Dark brown fluid was aspirated. Stiff Amplatz wire was advanced into the abdomen and the tract was dilated to accommodate a 10 [place] multipurpose drain. Sample of fluid was sent for culture. Drain was attached to a gravity bag and sutured to skin. FINDINGS: Free fluid in the right lateral abdomen. No significant fluid around the liver. Drainage catheter was placed within this free fluid. Dark brown fluid was aspirated and most compatible with bile. IMPRESSION: Ultrasound-guided placement of a drainage catheter in the right lateral abdomen. Electronically Signed   By: Markus Daft M.D.   On: 09/13/2017 18:31   Dg Chest  Port 1 View  Result Date: 08/31/2017 CLINICAL DATA:  Evaluate ETT.  Respiratory distress. EXAM: PORTABLE CHEST 1 VIEW COMPARISON:  August 30, 2017 FINDINGS: The ETT and left central line are stable. The NG tube terminates below today's film. No pneumothorax. No nodules or masses. Bilateral infiltrates, right greater than left, are stable. IMPRESSION: 1. Support apparatus as above. 2. Persistent significant pulmonary infiltrates, right greater than left. Electronically Signed   By: Dorise Bullion III M.D   On: 08/31/2017 07:23   Dg Chest Port 1 View  Result Date: 08/30/2017 CLINICAL DATA:  LEFT-sided central line placement. EXAM: PORTABLE CHEST 1 VIEW COMPARISON:  08/30/2017. FINDINGS: Unchanged cardiomediastinal silhouette. Calcified tortuous aorta. Significant RIGHT greater than LEFT lung opacities. LEFT-sided hemodialysis catheter was placed via LEFT IJ approach. Tip lies at the cavoatrial junction. There is no pneumothorax. ET tube and nasogastric tube are unchanged. IMPRESSION: Satisfactory LEFT hemodialysis catheter placement. No pneumothorax. Stable aeration. Electronically Signed   By: Staci Righter M.D.   On: 08/30/2017 12:34   Dg Chest Port 1 View  Result Date: 08/30/2017 CLINICAL DATA:  84 year old female postop day 6 from cholecystectomy. Postoperative bilious emesis and hypotension requiring intubation and pressor support. EXAM: PORTABLE CHEST 1 VIEW COMPARISON:  09/12/2017 and earlier. FINDINGS: Portable AP semi upright view at 0452 hours. It appears the patient has been extubated, no definite ET tube identified. The enteric tube remains in place. Stable lung volumes, but confluent airspace opacity throughout the right lung has progressed in the upper and peripheral lung since yesterday. Patchy and confluent left lung base opacity and perihilar persists and is stable. No pneumothorax or pleural effusion. Stable cardiac size and mediastinal contours. Calcified aortic atherosclerosis. Paucity of  bowel gas in the upper abdomen. IMPRESSION: 1. It appears the patient has been extubated, no  definite ET tube identified. 2. Stable enteric tube coursing to the abdomen. 3. Progressed and widespread abnormal right lung opacity, as well as left lung base and perihilar opacity. The appearance is suspicious for aspiration and pneumonia in this clinical setting. Electronically Signed   By: Genevie Ann M.D.   On: 08/30/2017 07:34   Dg Chest Port 1 View  Result Date: 09/02/2017 CLINICAL DATA:  ET tube placement EXAM: PORTABLE CHEST 1 VIEW COMPARISON:  09/13/2015 FINDINGS: Endotracheal tube is 2.6 cm above the carina. NG tube is in the stomach. Diffuse right lung airspace disease and airspace disease in the left lower lung. No visible effusions or pneumothorax. Heart is upper limits normal in size. IMPRESSION: New diffuse right lung and left lower lobe airspace disease. Endotracheal tube 2.6 cm above the carina. Electronically Signed   By: Rolm Baptise M.D.   On: 08/25/2017 10:21     DISCUSSION: [name] is a 84 year old female with ESRD on MWF HD, HTN, Anemia, and hx of breast cancer who was admitted 4/4 with acute cholecystitis. S/p lap chole 4/6 complicated by post-op bile leak. Went for ERCP 4/11 however developed bilious emesis during prep, hypotension, and required intubation plus pressor support. Transferred to the ICU on 4/11.  ASSESSMENT / PLAN:  PULMONARY A: Acute Respiratory Distress 2/2 Aspiration  4/13 - does not meet SBT/extubation criteria due to sepsis and sedation  P:   Continue vent support Wean and SBT as able Monitor CXR/ABG  CARDIOVASCULAR A:  #baseline Aortic Atherosclerosis on CXR RBBB - present prior to admission  #current  Septic Shock/Hypotension Elevated Troponin 4.47 - possibly Type II NSTEMI in setting of hypotensive event with decreased clearance in ESRD   - 4/13 - ongoing septic shock with levophed need. Was on heparin gtt. ECHo with good EF  P:   Continue neo for MAP >65, titrate off as able Ok not to be on heparin gtt given good eF - start heparin SQ for dvt proph   RENAL A:   ESRD on MWF HD P:   CRRT per renal starting   GASTROINTESTINAL A:   Acute Cholecystitis s/p lap chole 4/6 Bile leak s/p IR biliary drain 4/11 a   - rising LFT 08/31/17. CCS  P:   Biliary drain in place May need ERCP when stabilized Will try to reach GI - d/w DR Jackquline [place] later in AM - he will see   HEMATOLOGIC A:   Hx of Breast Cancer - no acute issue Anemia of Chronic Disease - no obvious bleeding Leukocytosis likely secondary to Aspiration PNA P:  .- PRBC for hgb </= 6.9gm%    - exceptions are   -  if ACS susepcted/confirmed then transfuse for hgb </= 8.0gm%,  or    -  active bleeding with hemodynamic instability, then transfuse regardless of hemoglobin value   At at all times try to transfuse 1 unit prbc as possible with exception of active hemorrhage    INFECTIOUS A:   Septic Shock from Aspiration PNA Bile Leak s/p IR Biliary Drain 4/11   P:   Zosyn  ENDOCRINE A:   Secondary Hyperparathyroidism   P:   Intermittent CBG monitoring  NEUROLOGIC A:   Pain/Sedation   - On fent gtt 146mcg with RASS -3. Weakly moves all 4s P:   RASS goal: 0 to -2 Fentanyl gtt - do wakeup assessment 08/31/17 Versed prn   FAMILY  - Updates: Patients 3 sons and sister updated at bedside  4/11.  Son at bddside updatd 08/30/17 - he seems upset at the decompensation  - Inter-disciplinary family meet or Palliative Care meeting due by:  09/05/17       The patient is critically ill with multiple organ systems failure and requires high complexity decision making for assessment and support, frequent evaluation and titration of therapies, application of advanced monitoring technologies and extensive interpretation of multiple databases.   Critical Care Time devoted to patient care services described in this note is  30  Minutes. This time  reflects time of care of this signee Dr Brand Males. This critical care time does not reflect procedure time, or teaching time or supervisory time of PA/NP/Med student/Med Resident etc but could involve care discussion time    Dr. Brand Males, M.D., [facility].C.P Pulmonary and Critical Care Medicine Staff Physician [facility] Pager: [phone], If no answer or between  15:00h - 7:00h: call [phone]  08/31/2017 9:01 AM

## 2017-08-31 NOTE — Progress Notes (Signed)
PHARMACY NOTE:  ANTIMICROBIAL RENAL DOSAGE ADJUSTMENT  Current antimicrobial regimen includes a mismatch between antimicrobial dosage and estimated renal function.  As per policy approved by the Pharmacy & Therapeutics and Medical Executive Committees, the antimicrobial dosage will be adjusted accordingly.  Current antimicrobial dosage: Zosyn 3.375 gm every 12 hours   Indication: Aspiration Pneumonia  Renal Function:  Estimated Creatinine Clearance: 7.6 mL/min (A) (by C-G formula based on SCr of 4.96 mg/dL (H)). []      On intermittent HD, scheduled: [x]      On CRRT    Antimicrobial dosage has been changed to:  Zosyn 3.375 gm every 6 hours (30 min infusion)  Additional comments:   Thank you for allowing pharmacy to be a part of this patient's care.  Jimmy Footman, PharmD, BCPS PGY2 Infectious Diseases Pharmacy Resident Pager: [phone]  08/31/2017 12:45 PM

## 2017-08-31 NOTE — Progress Notes (Signed)
2 Days Post-Op   Subjective/Chief Complaint: On vent  Eyes open  Son at bedside  Off pressors    Objective: Vital signs in last 24 hours: Temp:  [96.4 F (35.8 C)-97.7 F (36.5 C)] 97.4 F (36.3 C) (04/13 0721) Pulse Rate:  [68-108] 72 (04/13 0600) Resp:  [13-23] 18 (04/13 0600) BP: (69-163)/(37-115) 110/45 (04/13 0600) SpO2:  [91 %-100 %] 100 % (04/13 0600) FiO2 (%):  [40 %] 40 % (04/13 0306) Last BM Date: 08/30/17  Intake/Output from previous day: 04/12 0701 - 04/13 0700 In: 3617.6 [I.V.:3007.6; IV Piggyback:600] Out: 400 [Drains:400] Intake/Output this shift: No intake/output data recorded.  Incision/Wound:port sites CDI Drain 400 cc bile  Tender RUQ but not tense   Lab Results:  Recent Labs    08/30/17 0454 08/31/17 0315  WBC 18.5* 25.6*  HGB 8.1* 7.4*  HCT 24.1* 21.8*  PLT 320 252   BMET Recent Labs    08/30/17 0454 08/31/17 0315  NA 140 135  K 4.1 5.0  CL 100* 100*  CO2 23 21*  GLUCOSE 75 143*  BUN 29* 40*  CREATININE 4.50* 4.96*  CALCIUM 8.6* 8.5*   PT/INR Recent Labs    08/28/17 1258  LABPROT 18.3*  INR 1.53   ABG Recent Labs    08/30/17 0252 08/31/17 0252  PHART 7.369 7.275*  HCO3 31.0* 26.1    Studies/Results: Ir US Guide Bx Asp/drain  Result Date: 08/25/2017 INDICATION: 84 year old with cholecystectomy and postoperative bile leak. Plan for drain placement within the abdominal fluid. EXAM: ULTRASOUND-GUIDED PLACEMENT OF DRAINAGE CATHETER IN THE RIGHT ABDOMINAL FLUID MEDICATIONS: None ANESTHESIA/SEDATION: None COMPLICATIONS: None immediate. PROCEDURE: Informed written consent was obtained from the patient's family after a thorough discussion of the procedural risks, benefits and alternatives. All questions were addressed. Maximal Sterile Barrier Technique was utilized including caps, mask, sterile gowns, sterile gloves, sterile drape, hand hygiene and skin antiseptic. A timeout was performed prior to the initiation of the procedure.  Ultrasound demonstrated free fluid along the right lateral abdomen and adjacent to the inferior right hepatic lobe. Large amount of floating bowel in this area. The right side of the abdomen was prepped and draped in sterile fashion. Skin and soft tissues were anesthetized with 1% lidocaine. Yueh catheter was directed into the right abdominal fluid with ultrasound guidance. Dark brown fluid was aspirated. Stiff Amplatz wire was advanced into the abdomen and the tract was dilated to accommodate a 10 [place] multipurpose drain. Sample of fluid was sent for culture. Drain was attached to a gravity bag and sutured to skin. FINDINGS: Free fluid in the right lateral abdomen. No significant fluid around the liver. Drainage catheter was placed within this free fluid. Dark brown fluid was aspirated and most compatible with bile. IMPRESSION: Ultrasound-guided placement of a drainage catheter in the right lateral abdomen. Electronically Signed   By: Markus Daft M.D.   On: 08/27/2017 18:31   Dg Chest Port 1 View  Result Date: 08/31/2017 CLINICAL DATA:  Evaluate ETT.  Respiratory distress. EXAM: PORTABLE CHEST 1 VIEW COMPARISON:  August 30, 2017 FINDINGS: The ETT and left central line are stable. The NG tube terminates below today's film. No pneumothorax. No nodules or masses. Bilateral infiltrates, right greater than left, are stable. IMPRESSION: 1. Support apparatus as above. 2. Persistent significant pulmonary infiltrates, right greater than left. Electronically Signed   By: Dorise Bullion III M.D   On: 08/31/2017 07:23   Dg Chest Port 1 View  Result Date: 08/30/2017 CLINICAL DATA:  LEFT-sided central line placement. EXAM: PORTABLE CHEST 1 VIEW COMPARISON:  08/30/2017. FINDINGS: Unchanged cardiomediastinal silhouette. Calcified tortuous aorta. Significant RIGHT greater than LEFT lung opacities. LEFT-sided hemodialysis catheter was placed via LEFT IJ approach. Tip lies at the cavoatrial junction. There is no  pneumothorax. ET tube and nasogastric tube are unchanged. IMPRESSION: Satisfactory LEFT hemodialysis catheter placement. No pneumothorax. Stable aeration. Electronically Signed   By: Staci Righter M.D.   On: 08/30/2017 12:34   Dg Chest Port 1 View  Result Date: 08/30/2017 CLINICAL DATA:  84 year old female postop day 6 from cholecystectomy. Postoperative bilious emesis and hypotension requiring intubation and pressor support. EXAM: PORTABLE CHEST 1 VIEW COMPARISON:  09/15/2017 and earlier. FINDINGS: Portable AP semi upright view at 0452 hours. It appears the patient has been extubated, no definite ET tube identified. The enteric tube remains in place. Stable lung volumes, but confluent airspace opacity throughout the right lung has progressed in the upper and peripheral lung since yesterday. Patchy and confluent left lung base opacity and perihilar persists and is stable. No pneumothorax or pleural effusion. Stable cardiac size and mediastinal contours. Calcified aortic atherosclerosis. Paucity of bowel gas in the upper abdomen. IMPRESSION: 1. It appears the patient has been extubated, no definite ET tube identified. 2. Stable enteric tube coursing to the abdomen. 3. Progressed and widespread abnormal right lung opacity, as well as left lung base and perihilar opacity. The appearance is suspicious for aspiration and pneumonia in this clinical setting. Electronically Signed   By: Genevie Ann M.D.   On: 08/30/2017 07:34   Dg Chest Port 1 View  Result Date: 09/16/2017 CLINICAL DATA:  ET tube placement EXAM: PORTABLE CHEST 1 VIEW COMPARISON:  09/13/2015 FINDINGS: Endotracheal tube is 2.6 cm above the carina. NG tube is in the stomach. Diffuse right lung airspace disease and airspace disease in the left lower lung. No visible effusions or pneumothorax. Heart is upper limits normal in size. IMPRESSION: New diffuse right lung and left lower lobe airspace disease. Endotracheal tube 2.6 cm above the carina.  Electronically Signed   By: Rolm Baptise M.D.   On: 09/16/2017 10:21    Anti-infectives: Anti-infectives (From admission, onward)   Start     Dose/Rate Route Frequency Ordered Stop   08/31/17 0800  piperacillin-tazobactam (ZOSYN) IVPB 3.375 g     3.375 g 100 mL/hr over 30 Minutes Intravenous Every 6 hours 08/31/17 0739     08/30/17 2000  Ampicillin-Sulbactam (UNASYN) 3 g in sodium chloride 0.9 % 100 mL IVPB  Status:  Discontinued     3 g 200 mL/hr over 30 Minutes Intravenous Every 24 hours 09/08/2017 0953 08/30/17 0945   08/30/17 1230  piperacillin-tazobactam (ZOSYN) IVPB 3.375 g  Status:  Discontinued     3.375 g 12.5 mL/hr over 240 Minutes Intravenous Every 12 hours 08/30/17 1134 08/31/17 0738   08/23/2017 1030  Ampicillin-Sulbactam (UNASYN) 3 g in sodium chloride 0.9 % 100 mL IVPB     3 g 200 mL/hr over 30 Minutes Intravenous  Once 08/27/2017 0953 08/28/2017 1246   09/04/2017 0000  ampicillin-sulbactam (UNASYN) 1.5 g in sodium chloride 0.9 % 100 mL IVPB  Status:  Discontinued     1.5 g 200 mL/hr over 30 Minutes Intravenous Once 08/28/17 1045 09/09/2017 0928   09/03/2017 2200  piperacillin-tazobactam (ZOSYN) IVPB 3.375 g    Note to Pharmacy:  Zosyn 3.375 g IV q12h in ESRD on HD   3.375 g 12.5 mL/hr over 240 Minutes Intravenous Every 12 hours 09/04/2017 1155  08/25/17 1335   08/22/17 1000  piperacillin-tazobactam (ZOSYN) IVPB 3.375 g  Status:  Discontinued    Note to Pharmacy:  Zosyn 3.375 g IV q12h in ESRD on HD   3.375 g 12.5 mL/hr over 240 Minutes Intravenous Every 12 hours 08/22/17 0128 08/22/2017 1155   08/22/17 0045  piperacillin-tazobactam (ZOSYN) IVPB 3.375 g     3.375 g 12.5 mL/hr over 240 Minutes Intravenous  Once 08/22/17 0038 08/22/17 0145      Assessment/Plan:  HTN ESRD - HD MWF H/o breast cancer  Acute cholecystitis POD6s/p lap chole4/6/19 by Dr. Redmond Pulling - CT scan 4/8 w/ small amount fluid around liver and layering in pelvis ; HIDA 4/9 positive for bile leak  -drain  placed for biloma.  -still no ERCP for stent placement yet.  Now with likely NSTEMI.  Defer timing of ERCP to GI. -cont OG while intubated.  May be able to start TFs at some point in the future, but still very quiet on exam.  VDRF secondary to aspiration -CCM consulted. Started on Unasyn  Hypotension -improved   NSTEMI -per CCM  ID -zosyn 4/4>>4/7,Unasyn 4/11--> FEN -NPO/OGT VTE -SCDs, heparin gtt Foley -per CCM Follow up -Dr. Redmond Pulling  Plan-  Looks a little better  Needs ERCP if able  If not may need to consider PTC by IR  Would wait for now until overall condition improved      LOS: 9 days    Pam Fuller 08/31/2017

## 2017-08-31 NOTE — Progress Notes (Signed)
Upon assessment patient had a fixed stare, which was similar to yesrterday. Pupils equal and are reactive. Fentanyl stopped, and pt demonstrated the face or uncomfort, distress, and fear/anxiety. Pt's family is at the bedside and stimulates the pt very often. Pt is able but very weak to move her extremities.  MD notified and aware. Versed 1mg  given and pt seems more comfortable.  CRRT started with no complications. Continue to monitor

## 2017-08-31 NOTE — Plan of Care (Signed)
Some lab results  worsening. CRRT started. No complications. Levo is paused at this time. Pt alert, but not following commands. Family at the bedside. Education is provided.

## 2017-09-01 ENCOUNTER — Inpatient Hospital Stay (HOSPITAL_COMMUNITY): Payer: Medicare Other

## 2017-09-01 LAB — RENAL FUNCTION PANEL
ALBUMIN: 1.6 g/dL — AB (ref 3.5–5.0)
ANION GAP: 8 (ref 5–15)
ANION GAP: 8 (ref 5–15)
Albumin: 1.7 g/dL — ABNORMAL LOW (ref 3.5–5.0)
BUN: 12 mg/dL (ref 6–20)
BUN: 8 mg/dL (ref 6–20)
CALCIUM: 7.7 mg/dL — AB (ref 8.9–10.3)
CHLORIDE: 102 mmol/L (ref 101–111)
CO2: 26 mmol/L (ref 22–32)
CO2: 25 mmol/L (ref 22–32)
CREATININE: 1.88 mg/dL — AB (ref 0.44–1.00)
CREATININE: 1.7 mg/dL — AB (ref 0.44–1.00)
Calcium: 8 mg/dL — ABNORMAL LOW (ref 8.9–10.3)
Chloride: 103 mmol/L (ref 101–111)
GFR calc non Af Amer: 24 mL/min — ABNORMAL LOW (ref >60)
GFR calc non Af Amer: 27 mL/min — ABNORMAL LOW (ref >60)
GFR, EST AFRICAN AMERICAN: 28 mL/min — AB (ref >60)
GFR, EST AFRICAN AMERICAN: 31 mL/min — AB (ref >60)
Glucose, Bld: 99 mg/dL (ref 65–99)
Glucose, Bld: 107 mg/dL — ABNORMAL HIGH (ref 65–99)
PHOSPHORUS: 2.4 mg/dL — AB (ref 2.5–4.6)
POTASSIUM: 3.8 mmol/L (ref 3.5–5.1)
Phosphorus: 1.8 mg/dL — ABNORMAL LOW (ref 2.5–4.6)
Potassium: 3.8 mmol/L (ref 3.5–5.1)
SODIUM: 136 mmol/L (ref 135–145)
Sodium: 136 mmol/L (ref 135–145)

## 2017-09-01 LAB — POCT I-STAT 3, ART BLOOD GAS (G3+)
ACID-BASE EXCESS: 3 mmol/L — AB (ref 0.0–2.0)
BICARBONATE: 27.5 mmol/L (ref 20.0–28.0)
Bicarbonate: 28.7 mmol/L — ABNORMAL HIGH (ref 20.0–28.0)
O2 SAT: 99 %
O2 SAT: 96 %
PCO2 ART: 49.3 mmHg — AB (ref 32.0–48.0)
TCO2: 30 mmol/L (ref 22–32)
TCO2: 29 mmol/L (ref 22–32)
pCO2 arterial: 52.7 mmHg — ABNORMAL HIGH (ref 32.0–48.0)
pH, Arterial: 7.373 (ref 7.350–7.450)
pH, Arterial: 7.323 — ABNORMAL LOW (ref 7.350–7.450)
pO2, Arterial: 161 mmHg — ABNORMAL HIGH (ref 83.0–108.0)
pO2, Arterial: 87 mmHg (ref 83.0–108.0)

## 2017-09-01 LAB — GLUCOSE, CAPILLARY
GLUCOSE-CAPILLARY: 98 mg/dL (ref 65–99)
GLUCOSE-CAPILLARY: 97 mg/dL (ref 65–99)
Glucose-Capillary: 103 mg/dL — ABNORMAL HIGH (ref 65–99)
Glucose-Capillary: 108 mg/dL — ABNORMAL HIGH (ref 65–99)
Glucose-Capillary: 118 mg/dL — ABNORMAL HIGH (ref 65–99)
Glucose-Capillary: 129 mg/dL — ABNORMAL HIGH (ref 65–99)
Glucose-Capillary: 80 mg/dL (ref 65–99)

## 2017-09-01 LAB — CBC
HEMATOCRIT: 17.7 % — AB (ref 36.0–46.0)
HEMATOCRIT: 28.6 % — AB (ref 36.0–46.0)
HEMOGLOBIN: 6 g/dL — AB (ref 12.0–15.0)
Hemoglobin: 9.5 g/dL — ABNORMAL LOW (ref 12.0–15.0)
MCH: 29.4 pg (ref 26.0–34.0)
MCH: 28.4 pg (ref 26.0–34.0)
MCHC: 33.9 g/dL (ref 30.0–36.0)
MCHC: 33.2 g/dL (ref 30.0–36.0)
MCV: 86.8 fL (ref 78.0–100.0)
MCV: 85.4 fL (ref 78.0–100.0)
PLATELETS: 144 10*3/uL — AB (ref 150–400)
Platelets: 157 10*3/uL (ref 150–400)
RBC: 2.04 MIL/uL — ABNORMAL LOW (ref 3.87–5.11)
RBC: 3.35 MIL/uL — ABNORMAL LOW (ref 3.87–5.11)
RDW: 15.6 % — ABNORMAL HIGH (ref 11.5–15.5)
RDW: 15.9 % — AB (ref 11.5–15.5)
WBC: 27.7 10*3/uL — AB (ref 4.0–10.5)
WBC: 31 10*3/uL — AB (ref 4.0–10.5)

## 2017-09-01 LAB — LIPID PANEL
Cholesterol: 90 mg/dL (ref 0–200)
Triglycerides: 90 mg/dL (ref <150)
VLDL: 18 mg/dL (ref 0–40)

## 2017-09-01 LAB — PREPARE RBC (CROSSMATCH)

## 2017-09-01 LAB — HEPATIC FUNCTION PANEL
ALBUMIN: 1.7 g/dL — AB (ref 3.5–5.0)
ALT: 689 U/L — AB (ref 14–54)
AST: 1244 U/L — AB (ref 15–41)
Alkaline Phosphatase: 260 U/L — ABNORMAL HIGH (ref 38–126)
BILIRUBIN INDIRECT: 1.2 mg/dL — AB (ref 0.3–0.9)
Bilirubin, Direct: 2 mg/dL — ABNORMAL HIGH (ref 0.1–0.5)
TOTAL PROTEIN: 5.2 g/dL — AB (ref 6.5–8.1)
Total Bilirubin: 3.2 mg/dL — ABNORMAL HIGH (ref 0.3–1.2)

## 2017-09-01 LAB — MAGNESIUM: MAGNESIUM: 2.1 mg/dL (ref 1.7–2.4)

## 2017-09-01 LAB — LACTIC ACID, PLASMA: Lactic Acid, Venous: 1.8 mmol/L (ref 0.5–1.9)

## 2017-09-01 MED ORDER — FUROSEMIDE 10 MG/ML IJ SOLN
40.0000 mg | Freq: Once | INTRAMUSCULAR | Status: AC
  Administered 2017-09-01: 40 mg via INTRAVENOUS
  Filled 2017-09-01: qty 4

## 2017-09-01 MED ORDER — SODIUM CHLORIDE 0.9 % IV SOLN
INTRAVENOUS | Status: DC
  Administered 2017-09-01 – 2017-09-16 (×4): via INTRAVENOUS

## 2017-09-01 MED ORDER — TECHNETIUM TC 99M MEBROFENIN IV KIT
5.4500 | PACK | Freq: Once | INTRAVENOUS | Status: AC | PRN
  Administered 2017-09-01: 5.45 via INTRAVENOUS

## 2017-09-01 MED ORDER — SODIUM CHLORIDE 0.9 % IV SOLN: Freq: Once | INTRAVENOUS | Status: DC

## 2017-09-01 MED ORDER — DARBEPOETIN ALFA 150 MCG/0.3ML IJ SOSY
150.0000 ug | PREFILLED_SYRINGE | INTRAMUSCULAR | Status: DC
  Administered 2017-09-02: 150 ug via INTRAVENOUS
  Filled 2017-09-01 (×5): qty 0.3

## 2017-09-01 MED ORDER — SODIUM GLYCEROPHOSPHATE 1 MMOLE/ML IV SOLN
20.0000 mmol | Freq: Once | INTRAVENOUS | Status: AC
  Administered 2017-09-01: 20 mmol via INTRAVENOUS
  Filled 2017-09-01: qty 20

## 2017-09-01 MED ORDER — DEXMEDETOMIDINE HCL IN NACL 200 MCG/50ML IV SOLN
0.4000 ug/kg/h | INTRAVENOUS | Status: DC
Start: 2017-09-01 — End: 2017-09-03
  Administered 2017-09-01 (×2): 0.4 ug/kg/h via INTRAVENOUS
  Filled 2017-09-01 (×2): qty 50

## 2017-09-01 NOTE — Progress Notes (Signed)
RT note-Ventilator setting changed to 28ml/kg per order, patient tolerating fairly well, not synchronized well, continue to monitor. ABG in 30 min.

## 2017-09-01 NOTE — Progress Notes (Signed)
Progress Note   Subjective  Episode of hyperthermia requiring heating blanket, bile drainage is much less today, on CRRT. Troponins still high but appears to have peaked.    Objective   Vital signs in last 24 hours: Temp:  [93.4 F (34.1 C)-98 F (36.7 C)] 97.5 F (36.4 C) (04/14 1255) Pulse Rate:  [65-101] 91 (04/14 1315) Resp:  [16-31] 17 (04/14 1315) BP: (86-136)/(39-92) 101/52 (04/14 1315) SpO2:  [98 %-100 %] 99 % (04/14 1315) FiO2 (%):  [30 %-40 %] 30 % (04/14 1137) Weight:  [144 lb 10 oz (65.6 kg)-151 lb 0.2 oz (68.5 kg)] 144 lb 10 oz (65.6 kg) (04/14 0430) Last BM Date: 09/01/17 General:   intubated Heart:  Regular rate and rhythm; no murmurs Lungs: Respirations even and unlabored, lungs CTA bilaterally Abdomen:  Soft, nontender and nondistended. Decreased bowel sounds. IR drainwith minimal drainage  Intake/Output from previous day: 04/13 0701 - 04/14 0700 In: 1606.2 [I.V.:1246.2; Blood:140; IV Piggyback:200] Out: 2574 [Emesis/NG output:200; Drains:300] Intake/Output this shift: Total I/O In: 1233.5 [I.V.:349.4; Blood:794.1; NG/GT:40; IV Piggyback:50] Out: 392 [Drains:160; Other:232]  Lab Results: Recent Labs    08/30/17 0454 08/31/17 0315 09/01/17 0357  WBC 18.5* 25.6* 27.7*  HGB 8.1* 7.4* 6.0*  HCT 24.1* 21.8* 17.7*  PLT 320 252 157   BMET Recent Labs    08/31/17 0315 08/31/17 1552 09/01/17 0357  NA 135 137 136  K 5.0 3.7 3.8  CL 100* 105 102  CO2 21* 22 26  GLUCOSE 143* 350* 99  BUN 40* 23* 12  CREATININE 4.96* 3.00* 1.88*  CALCIUM 8.5* 7.9* 8.0*   LFT Recent Labs    09/01/17 0357  PROT 5.2*  ALBUMIN 1.7*  1.7*  AST 1,244*  ALT 689*  ALKPHOS 260*  BILITOT 3.2*  BILIDIR 2.0*  IBILI 1.2*   PT/INR No results for input(s): LABPROT, INR in the last 72 hours.  Studies/Results: US Abdomen Complete  Result Date: 09/01/2017 CLINICAL DATA:  Bile leak, postoperative. History of cholecystectomy. History of hypertension. EXAM: ABDOMEN  ULTRASOUND COMPLETE COMPARISON:  None. FINDINGS: Gallbladder: Surgically absent. Common bile duct: Diameter: 5 mm Liver: No focal lesion identified. Within normal limits in parenchymal echogenicity. Small amount of free fluid adjacent to the liver, approximating the gallbladder fossa. Portal vein is patent on color Doppler imaging with normal direction of blood flow towards the liver. IVC: No abnormality visualized. Pancreas: Visualized portion unremarkable. Spleen: Size and appearance within normal limits. Right Kidney: Length: 6 cm. Poorly seen. Echogenic suggesting chronic medical renal disease. Hypoechoic masses within the RIGHT kidney measuring 1.9 cm and 1 cm respectively, likely cysts. Left Kidney: Not seen.  Appeared similarly atrophic on recent CT. Abdominal aorta: No aneurysm visualized. Other findings: Ascites within the LEFT upper quadrant. IMPRESSION: 1. Status post cholecystectomy. Small amount of fluid adjacent to the liver near the cholecystectomy site, and expected amount status post recent cholecystectomy, although nuclear medicine study of 08/27/2017 described a probable bile leak. 2. Atrophic kidneys. Probable RIGHT renal cysts, too small to definitively characterize. Cysts were described on previous CT report. 3. LEFT upper quadrant ascites. Electronically Signed   By: Franki Cabot M.D.   On: 09/01/2017 12:00   Dg Chest Port 1 View  Result Date: 09/01/2017 CLINICAL DATA:  Endotracheal tube present EXAM: PORTABLE CHEST 1 VIEW COMPARISON:  Chest x-rays dated 08/31/2017 and 08/30/2017. FINDINGS: Endotracheal tube remains well positioned with tip approximately 2-3 cm above the carina. LEFT IJ central line is stable in position with  tip at the level of the mid SVC. Enteric tube passes below the diaphragm. The bilateral airspace opacities are not significantly changed compared to yesterday's exam, improved compared to the earlier study of 08/30/2017. No new lung findings. No pneumothorax seen.  IMPRESSION: 1. Bilateral airspace opacities, pulmonary edema versus pneumonia, not significantly changed compared to yesterday's exam, but improved compared to the earlier study of 08/30/2017. 2. Support apparatus remains appropriately positioned. Electronically Signed   By: Franki Cabot M.D.   On: 09/01/2017 07:28   Dg Chest Port 1 View  Result Date: 08/31/2017 CLINICAL DATA:  Evaluate ETT.  Respiratory distress. EXAM: PORTABLE CHEST 1 VIEW COMPARISON:  August 30, 2017 FINDINGS: The ETT and left central line are stable. The NG tube terminates below today's film. No pneumothorax. No nodules or masses. Bilateral infiltrates, right greater than left, are stable. IMPRESSION: 1. Support apparatus as above. 2. Persistent significant pulmonary infiltrates, right greater than left. Electronically Signed   By: Dorise Bullion III M.D   On: 08/31/2017 07:23       Assessment / Plan:    84 year old female with ESRD on HD s/p lap chole 4/6, post op bile leak s/p IR drain 4/11. Septic shock with respiratory failure and aspiration pneumonia, had non-STEMI with Nl EF with elevated troponin. Now with elevated LFTs likely due to shock liver. Patient is currently intubated, on pressors, not stable for ERCP. Pt is critically ill with multiple organ failure. Ultrasound today did not show any significant bile collection, nl liver. Biliary drainage has reduced significantly.  Plan: - HIDA scan in a.m to reassess bile leak.  - If positive, ERCP in the next 2-3 days, once okay with critical care team. - Continue antibiotics. - Trend liver function tests. - Discussed with the patient's son.   Carmell Austria MD

## 2017-09-01 NOTE — Progress Notes (Signed)
Referring Physician(s): Dr. Rosendo Gros  Supervising Physician: Markus Daft  Patient Status:  [facility] - In-pt  Chief Complaint: Bile leak  Subjective: Intubated.  More alert.  Son at bedside.   Allergies: Erythromycin and Vicodin [hydrocodone-acetaminophen]  Medications: Prior to Admission medications   Medication Sig Start Date End Date Taking? Authorizing Provider  amLODipine (NORVASC) 10 MG tablet Take 10 mg by mouth every evening.  03/31/13  Yes [provider]  calcium acetate (PHOSLO) 667 MG capsule Take 2 capsules (1,334 mg total) by mouth 3 (three) times daily with meals. 09/15/15  Yes Dana Allan I, MD  cloNIDine (CATAPRES) 0.1 MG tablet Take 0.1 mg by mouth every evening.  03/24/13  Yes [provider]  fluticasone (FLONASE) 50 MCG/ACT nasal spray Place 2 sprays into both nostrils daily. 09/15/15  Yes Dana Allan I, MD  hydrALAZINE (APRESOLINE) 50 MG tablet Take 50 mg by mouth every evening.  04/14/13  Yes [provider]  multivitamin (RENA-VIT) TABS tablet Take 1 tablet by mouth at bedtime. 09/15/15  Yes Bonnell Public, MD  tamoxifen (NOLVADEX) 20 MG tablet Take 1 tablet (20 mg total) by mouth daily. Patient taking differently: Take 20 mg by mouth every evening.  02/12/17  Yes Causey, Charlestine Massed, NP  hydrOXYzine (ATARAX/VISTARIL) 25 MG tablet Take 1 tablet (25 mg total) by mouth every 8 (eight) hours as needed for itching. Patient not taking: Reported on 08/22/2017 09/15/15   Dana Allan I, MD     Vital Signs: BP (!) 112/57   Pulse 85   Temp (!) 97.3 F (36.3 C) (Oral)   Resp (!) 24   Ht 5\' 2"  (1.575 m)   Wt 144 lb 10 oz (65.6 kg)   SpO2 99%   BMI 26.45 kg/m   Physical Exam  Nursing note and vitals reviewed. NAD, open eyes Intubated Abdomen:  RUQ drain in place.  Insertion site c/d/i.  Dark bilious output in collection bag with 300+ mL output per day.   Imaging: Ir US Guide Bx Asp/drain  Result Date:  08/28/2017 INDICATION: 84 year old with cholecystectomy and postoperative bile leak. Plan for drain placement within the abdominal fluid. EXAM: ULTRASOUND-GUIDED PLACEMENT OF DRAINAGE CATHETER IN THE RIGHT ABDOMINAL FLUID MEDICATIONS: None ANESTHESIA/SEDATION: None COMPLICATIONS: None immediate. PROCEDURE: Informed written consent was obtained from the patient's family after a thorough discussion of the procedural risks, benefits and alternatives. All questions were addressed. Maximal Sterile Barrier Technique was utilized including caps, mask, sterile gowns, sterile gloves, sterile drape, hand hygiene and skin antiseptic. A timeout was performed prior to the initiation of the procedure. Ultrasound demonstrated free fluid along the right lateral abdomen and adjacent to the inferior right hepatic lobe. Large amount of floating bowel in this area. The right side of the abdomen was prepped and draped in sterile fashion. Skin and soft tissues were anesthetized with 1% lidocaine. Yueh catheter was directed into the right abdominal fluid with ultrasound guidance. Dark brown fluid was aspirated. Stiff Amplatz wire was advanced into the abdomen and the tract was dilated to accommodate a 10 [place] multipurpose drain. Sample of fluid was sent for culture. Drain was attached to a gravity bag and sutured to skin. FINDINGS: Free fluid in the right lateral abdomen. No significant fluid around the liver. Drainage catheter was placed within this free fluid. Dark brown fluid was aspirated and most compatible with bile. IMPRESSION: Ultrasound-guided placement of a drainage catheter in the right lateral abdomen. Electronically Signed   By: Scherrie Gerlach.D.  On: 09/13/2017 18:31   Dg Chest Port 1 View  Result Date: 09/01/2017 CLINICAL DATA:  Endotracheal tube present EXAM: PORTABLE CHEST 1 VIEW COMPARISON:  Chest x-rays dated 08/31/2017 and 08/30/2017. FINDINGS: Endotracheal tube remains well positioned with tip approximately 2-3  cm above the carina. LEFT IJ central line is stable in position with tip at the level of the mid SVC. Enteric tube passes below the diaphragm. The bilateral airspace opacities are not significantly changed compared to yesterday's exam, improved compared to the earlier study of 08/30/2017. No new lung findings. No pneumothorax seen. IMPRESSION: 1. Bilateral airspace opacities, pulmonary edema versus pneumonia, not significantly changed compared to yesterday's exam, but improved compared to the earlier study of 08/30/2017. 2. Support apparatus remains appropriately positioned. Electronically Signed   By: Franki Cabot M.D.   On: 09/01/2017 07:28   Dg Chest Port 1 View  Result Date: 08/31/2017 CLINICAL DATA:  Evaluate ETT.  Respiratory distress. EXAM: PORTABLE CHEST 1 VIEW COMPARISON:  August 30, 2017 FINDINGS: The ETT and left central line are stable. The NG tube terminates below today's film. No pneumothorax. No nodules or masses. Bilateral infiltrates, right greater than left, are stable. IMPRESSION: 1. Support apparatus as above. 2. Persistent significant pulmonary infiltrates, right greater than left. Electronically Signed   By: Dorise Bullion III M.D   On: 08/31/2017 07:23   Dg Chest Port 1 View  Result Date: 08/30/2017 CLINICAL DATA:  LEFT-sided central line placement. EXAM: PORTABLE CHEST 1 VIEW COMPARISON:  08/30/2017. FINDINGS: Unchanged cardiomediastinal silhouette. Calcified tortuous aorta. Significant RIGHT greater than LEFT lung opacities. LEFT-sided hemodialysis catheter was placed via LEFT IJ approach. Tip lies at the cavoatrial junction. There is no pneumothorax. ET tube and nasogastric tube are unchanged. IMPRESSION: Satisfactory LEFT hemodialysis catheter placement. No pneumothorax. Stable aeration. Electronically Signed   By: Staci Righter M.D.   On: 08/30/2017 12:34   Dg Chest Port 1 View  Result Date: 08/30/2017 CLINICAL DATA:  84 year old female postop day 6 from cholecystectomy.  Postoperative bilious emesis and hypotension requiring intubation and pressor support. EXAM: PORTABLE CHEST 1 VIEW COMPARISON:  09/11/2017 and earlier. FINDINGS: Portable AP semi upright view at 0452 hours. It appears the patient has been extubated, no definite ET tube identified. The enteric tube remains in place. Stable lung volumes, but confluent airspace opacity throughout the right lung has progressed in the upper and peripheral lung since yesterday. Patchy and confluent left lung base opacity and perihilar persists and is stable. No pneumothorax or pleural effusion. Stable cardiac size and mediastinal contours. Calcified aortic atherosclerosis. Paucity of bowel gas in the upper abdomen. IMPRESSION: 1. It appears the patient has been extubated, no definite ET tube identified. 2. Stable enteric tube coursing to the abdomen. 3. Progressed and widespread abnormal right lung opacity, as well as left lung base and perihilar opacity. The appearance is suspicious for aspiration and pneumonia in this clinical setting. Electronically Signed   By: Genevie Ann M.D.   On: 08/30/2017 07:34   Dg Chest Port 1 View  Result Date: 08/19/2017 CLINICAL DATA:  ET tube placement EXAM: PORTABLE CHEST 1 VIEW COMPARISON:  09/13/2015 FINDINGS: Endotracheal tube is 2.6 cm above the carina. NG tube is in the stomach. Diffuse right lung airspace disease and airspace disease in the left lower lung. No visible effusions or pneumothorax. Heart is upper limits normal in size. IMPRESSION: New diffuse right lung and left lower lobe airspace disease. Endotracheal tube 2.6 cm above the carina. Electronically Signed   By:  Rolm Baptise M.D.   On: 08/31/2017 10:21    Labs:  CBC: Recent Labs    09/17/2017 1236 08/30/17 0454 08/31/17 0315 09/01/17 0357  WBC 9.3 18.5* 25.6* 27.7*  HGB 7.6* 8.1* 7.4* 6.0*  HCT 22.6* 24.1* 21.8* 17.7*  PLT 318 320 252 157    COAGS: Recent Labs    08/28/17 1258  INR 1.53    BMP: Recent Labs     08/30/17 0454 08/31/17 0315 08/31/17 1552 09/01/17 0357  NA 140 135 137 136  K 4.1 5.0 3.7 3.8  CL 100* 100* 105 102  CO2 23 21* 22 26  GLUCOSE 75 143* 350* 99  BUN 29* 40* 23* 12  CALCIUM 8.6* 8.5* 7.9* 8.0*  CREATININE 4.50* 4.96* 3.00* 1.88*  GFRNONAA 8* 7* 14* 24*  GFRAA 10* 9* 16* 28*    LIVER FUNCTION TESTS: Recent Labs    08/23/2017 0630  08/30/17 1100 08/31/17 0315 08/31/17 1552 09/01/17 0357  BILITOT 3.2*  --  3.6* 3.5*  --  3.2*  AST 22  --  607* 2,629*  --  1,244*  ALT 23  --  221* 912*  --  689*  ALKPHOS 197*  --  201* 253*  --  260*  PROT 6.6  --  5.4* 5.6*  --  5.2*  ALBUMIN 2.5*   < > 1.9* 1.9*  1.9* 1.7* 1.7*  1.7*   < > = values in this interval not displayed.    Assessment and Plan: Acute Cholecystitis s/p lap chole 09/16/2017, now with bile leak Patient remains intubated. She is now s/p NSTEMI, on pressors and CRRT.  LFTs significantly elevated.  Tbili remains 3.2 Plans for ERCP not yet finalized due to acuity of condition.  Drain remains intact today with dark bilious output.  To get additional imaging today.  Will follow.  Continue drain care.   Electronically Signed: Docia Barrier, PA 09/01/2017, 11:43 AM   I spent a total of 15 Minutes at the the patient's bedside AND on the patient's hospital floor or unit, greater than 50% of which was counseling/coordinating care for bile leak.

## 2017-09-01 NOTE — Progress Notes (Signed)
[place] notified of HgB-6.0. Type and cross current. Awaiting new orders. Will continue to monitor.

## 2017-09-01 NOTE — Progress Notes (Signed)
Nursing Note: 161mls for Fentanyl wasted in sink as witnessed by Sandford Craze, RN.

## 2017-09-01 NOTE — Progress Notes (Signed)
Pt went to the [facility] for the procedure. Levo was on beard. Pt tolerated ok. Levo drip was titrated up to stabilize the BP. No distress otherwise was noted. Pt returned from the procedure. Levo at 15. Precedex is on pause.  CRRT restarted with the new filter.  Temp is 99. Bearhugger is on hold.  Pt opens eyes and tracks with her eyes as prior. MD at the bedside updating son: and aware of lab will be drawn and send after an hour.

## 2017-09-01 NOTE — Progress Notes (Addendum)
PULMONARY / CRITICAL CARE MEDICINE   Name: Pam Fuller MRN: [id number] DOB: 23-Mar-1933    ADMISSION DATE:  09/08/2017 CONSULTATION DATE:  4/11  REFERRING MD:  Dr. Maudie Mercury  CHIEF COMPLAINT:  Aspiration  BRIEF SUMMARY:   Pam Fuller is a 84 year old female with ESRD on MWF HD, HTN, Anemia, and hx of breast cancer who presented to the ED on 4/4 with RLQ abdominal pain. She was found to have acute cholecystitis with a gallstone lodged in the gallbladder neck. CBD measured up to 10 mm. She underwent laparoscopic cholecystectomy on 4/6. She initially did well but on 4/8 was noted to have worsened pain and confusion and thought to have post-op ileus. CT abd/pelvis 4/8 showed small-mod fluid in the abdominal pelvis, read as normal post op changes but could not exclude a bile leak. A hepatobiliary scan was obtained on 4/9 which did suggest a bile leak. Surgery recommended IR drain and GI consult for ERCP. She went to the Endoscopy unit on 4/11 AM for ERCP however during prep with anesthesia she had significant bilious emesis with hypotension requiring intubation and pressor support. ERCP was not attempted. She was subsequently transferred to the medical ICU.   STUDIES:  CT abd/pelv 4/4 >> acute cholecystitis, gallstone in GB neck, CBD 10 mm  CT Head: no acute changes, chronic small vessel ischemic changes  CT abd/pelvis 4/8 >> small-mod fluid in abd/pelvis  Hepatobiliary scan 4/9 >> bile leak  TTE 4/12 >> EF 65-70%, G1DD, narrow LVOT, mild TR, mild pHTN   CULTURES: Tracheal aspirate 4/11 >> no growth Abd fluid 4/11 >>  ANTIBIOTICS: Zosyn 4/3 >> 4/7 Unasyn 4/11 Zosyn 4/12 >>  LINES/TUBES: ETT 4/11 OGT 4/11 PIV RUE 4/9 RLQ Biliary Drain 4/11 HD Cath LIJ 4/12  SIGNIFICANT EVENTS: 4/4 - admitted w/ acute cholecystitis 4/6 - lap chole 4/9 - bile leak seen on hepatobiliary scan 4/11 - Aspirated during prep for ERCP, intubated and transferred to ICU 4/11 - Rt abd fluid drain  placed by IR 4/12- Heparin gtt started for NSTEMI; held after OG bleeding and trop peak 4/13 - worsening liver enzyymes. ESRD - Anuric. CRRT started. On levophed 58mcg and fent 141mcg    SUBJECTIVE/OVERNIGHT/INTERVAL HX Hgb 6.0, transfusing 2 units PRBCs, hypothermic to 24F overnight and placed on bear hugger.  VITAL SIGNS: BP (!) 111/49   Pulse 81   Temp (!) 97.5 F (36.4 C) (Axillary)   Resp (!) 24   Ht 5\' 2"  (1.575 m)   Wt 144 lb 10 oz (65.6 kg)   SpO2 100%   BMI 26.45 kg/m   HEMODYNAMICS: CVP:  [9 mmHg] 9 mmHg  VENTILATOR SETTINGS: Vent Mode: PRVC FiO2 (%):  [40 %] 40 % Set Rate:  [18 bmp] 18 bmp Vt Set:  [400 mL] 400 mL PEEP:  [5 cmH20] 5 cmH20 Plateau Pressure:  [11 cmH20-22 cmH20] 22 cmH20  INTAKE / OUTPUT: I/O last 3 completed shifts: In: 2479.8 [I.V.:2059.8; Blood:140; Other:30; IV Piggyback:250] Out: 2599 [Emesis/NG output:200; Drains:325; Other:2074]  PHYSICAL EXAMINATION: General: elderly woman, intubated  HEENT: pupils reactive to light, ETT/OGT in place, LIJ HD cath in place Cardiac: RRR, no rubs, murmurs or gallops Pulm: rhonchi anteriorly R > L Abd: Rt abdominal fluid drain in place Ext: warm and well perfused, LUE AVF Neuro: eyes open, tracks somewhat   PULMONARY Recent Labs  Lab 08/23/2017 1112 08/30/17 0252 08/31/17 0252  PHART 7.398 7.369 7.275*  PCO2ART 53.1* 53.7* 55.8*  PO2ART 292.0* 89.0 126.0*  HCO3 32.7* 31.0* 26.1  TCO2 34* 33* 28  O2SAT 100.0 96.0 98.0    CBC Recent Labs  Lab 08/30/17 0454 08/31/17 0315 09/01/17 0357  HGB 8.1* 7.4* 6.0*  HCT 24.1* 21.8* 17.7*  WBC 18.5* 25.6* 27.7*  PLT 320 252 157    COAGULATION Recent Labs  Lab 08/28/17 1258  INR 1.53    CARDIAC   Recent Labs  Lab 09/12/2017 1708 08/30/17 0057 08/30/17 0454 08/30/17 1100 08/31/17 0315  TROPONINI 1.40* 3.17* 4.47* 3.62* 2.86*   No results for input(s): PROBNP in the last 168 hours.   CHEMISTRY Recent Labs  Lab 08/31/2017 1236  08/30/17 0454 08/31/17 0315 08/31/17 1552 09/01/17 0357  NA 140 140 135 137 136  K 3.4* 4.1 5.0 3.7 3.8  CL 98* 100* 100* 105 102  CO2 28 23 21* 22 26  GLUCOSE 94 75 143* 350* 99  BUN 20 29* 40* 23* 12  CREATININE 3.75* 4.50* 4.96* 3.00* 1.88*  CALCIUM 8.5* 8.6* 8.5* 7.9* 8.0*  MG  --  2.1 2.2  --  2.1  PHOS  --  4.8* 5.4* 3.0 1.8*   Estimated Creatinine Clearance: 20.5 mL/min (A) (by C-G formula based on SCr of 1.88 mg/dL (H)).  I/O last 3 completed shifts: In: 2479.8 [I.V.:2059.8; Blood:140; Other:30; IV Piggyback:250] Out: 2599 [Emesis/NG output:200; Drains:325; Other:2074]   Intake/Output Summary (Last 24 hours) at 09/01/2017 0718 Last data filed at 09/01/2017 0700 Gross per 24 hour  Intake 1606.17 ml  Output 2574 ml  Net -967.83 ml     LIVER Recent Labs  Lab 08/28/17 0522 08/28/17 1258 08/22/2017 0630 08/30/17 0454 08/30/17 1100 08/31/17 0315 08/31/17 1552 09/01/17 0357  AST 28  --  22  --  607* 2,629*  --  1,244*  ALT 28  --  23  --  221* 912*  --  689*  ALKPHOS 177*  --  197*  --  201* 253*  --  260*  BILITOT 2.5*  --  3.2*  --  3.6* 3.5*  --  3.2*  PROT 5.8*  --  6.6  --  5.4* 5.6*  --  5.2*  ALBUMIN 1.8*  --  2.5* 2.1* 1.9* 1.9*  1.9* 1.7* 1.7*  1.7*  INR  --  1.53  --   --   --   --   --   --      INFECTIOUS Recent Labs  Lab 08/30/17 1501 08/31/17 0906 09/01/17 0018  LATICACIDVEN 4.7* 1.1 1.8     ENDOCRINE CBG (last 3)  Recent Labs    08/31/17 1928 09/01/17 0016 09/01/17 0424  GLUCAP 85 80 98         IMAGING [id number]  - image(s) personally visualized  -   highlighted in bold Dg Chest Port 1 View  Result Date: 08/31/2017 CLINICAL DATA:  Evaluate ETT.  Respiratory distress. EXAM: PORTABLE CHEST 1 VIEW COMPARISON:  August 30, 2017 FINDINGS: The ETT and left central line are stable. The NG tube terminates below today's film. No pneumothorax. No nodules or masses. Bilateral infiltrates, right greater than left, are stable. IMPRESSION: 1.  Support apparatus as above. 2. Persistent significant pulmonary infiltrates, right greater than left. Electronically Signed   By: Dorise Bullion III M.D   On: 08/31/2017 07:23   Dg Chest Port 1 View  Result Date: 08/30/2017 CLINICAL DATA:  LEFT-sided central line placement. EXAM: PORTABLE CHEST 1 VIEW COMPARISON:  08/30/2017. FINDINGS: Unchanged cardiomediastinal silhouette. Calcified tortuous aorta. Significant RIGHT greater than  LEFT lung opacities. LEFT-sided hemodialysis catheter was placed via LEFT IJ approach. Tip lies at the cavoatrial junction. There is no pneumothorax. ET tube and nasogastric tube are unchanged. IMPRESSION: Satisfactory LEFT hemodialysis catheter placement. No pneumothorax. Stable aeration. Electronically Signed   By: Staci Righter M.D.   On: 08/30/2017 12:34     DISCUSSION: [name] is a 84 year old female with ESRD on MWF HD, HTN, Anemia, and hx of breast cancer who was admitted 4/4 with acute cholecystitis. S/p lap chole 4/6 complicated by post-op bile leak. Went for ERCP 4/11 however developed bilious emesis during prep, hypotension, and required intubation plus pressor support. Transferred to the ICU on 4/11.  ASSESSMENT / PLAN: PULMONARY A: Acute Respiratory Distress 2/2 Aspiration Intubated 4/11 P:   Continue vent support Wean and SBT as able Monitor CXR/ABG - repeat ABG today  CARDIOVASCULAR A:  #baseline Aortic Atherosclerosis on CXR RBBB - present prior to admission #current Septic Shock/Hypotension Type II NSTEMI in setting of hypotensive event Echocardiogram w/ EF 65-70% P:  Continue levophed for MAP >65, titrate off as able Hold subq heparin with low Hgb  RENAL A:   ESRD on MWF HD P:   CRRT per renal  GASTROINTESTINAL A:   Acute Cholecystitis s/p lap chole 4/6 Bile leak s/p IR biliary drain 4/11 Shock Liver P:   Biliary drain in place GI following - plan for repeat bedside U/S this am; ERCP if able this  week  HEMATOLOGIC A:   Hx of Breast Cancer - no acute issue Anemia of Chronic Disease - Hgb 6.0 - transfusing 2 units PRBCs Leukocytosis likely secondary to Aspiration PNA P:  Monitor Hgb, transfuse for Hgb <7.0 Monitor for bleeding Transfusing 2 units PRBCs - f/u post-transfusion H&H  INFECTIOUS A:   Septic Shock from Aspiration PNA Bile Leak s/p IR Biliary Drain 4/11 P:   Zosyn  ENDOCRINE A:   Secondary Hyperparathyroidism   P:   Intermittent CBG monitoring  NEUROLOGIC A:   Pain/Agitation P:   RASS goal: 0 to -2 Fentanyl gtt Versed prn   FAMILY  - Updates: Patients 3 sons and sister updated at bedside 4/11.  Son at bddside updatd 08/30/17 - he seems upset at the decompensation  - Inter-disciplinary family meet or Palliative Care meeting due by:  09/05/17   Zada Finders, MD Internal Medicine PGY-3  09/01/2017 7:18 AM

## 2017-09-01 NOTE — Progress Notes (Signed)
3 Days Post-Op   Subjective/Chief Complaint: On vent  Eyes open spontaneously; son at bedside  Low dose levo overnight, off this AM.   Objective: Vital signs in last 24 hours: Temp:  [93.4 F (34.1 C)-97.9 F (36.6 C)] 97.3 F (36.3 C) (04/14 1022) Pulse Rate:  [65-98] 91 (04/14 1045) Resp:  [16-31] 22 (04/14 1045) BP: (81-136)/(39-92) 98/50 (04/14 1045) SpO2:  [99 %-100 %] 100 % (04/14 1045) FiO2 (%):  [30 %-40 %] 30 % (04/14 1026) Weight:  [65.6 kg (144 lb 10 oz)-68.5 kg (151 lb 0.2 oz)] 65.6 kg (144 lb 10 oz) (04/14 0430) Last BM Date: 09/01/17  Intake/Output from previous day: 04/13 0701 - 04/14 0700 In: 1606.2 [I.V.:1246.2; Blood:140; IV Piggyback:200] Out: 2574 [Emesis/NG output:200; Drains:300] Intake/Output this shift: Total I/O In: 621.5 [I.V.:181.9; Blood:389.6; IV Piggyback:50] Out: 354 [Drains:160; Other:194]  Incision/Wound:port sites CDI Drain 400 cc bile  Tender RUQ but not tense   Lab Results:  Recent Labs    08/31/17 0315 09/01/17 0357  WBC 25.6* 27.7*  HGB 7.4* 6.0*  HCT 21.8* 17.7*  PLT 252 157   BMET Recent Labs    08/31/17 1552 09/01/17 0357  NA 137 136  K 3.7 3.8  CL 105 102  CO2 22 26  GLUCOSE 350* 99  BUN 23* 12  CREATININE 3.00* 1.88*  CALCIUM 7.9* 8.0*   PT/INR No results for input(s): LABPROT, INR in the last 72 hours. ABG Recent Labs    08/31/17 0252 09/01/17 0834  PHART 7.275* 7.373  HCO3 26.1 28.7*    Studies/Results: Dg Chest Port 1 View  Result Date: 09/01/2017 CLINICAL DATA:  Endotracheal tube present EXAM: PORTABLE CHEST 1 VIEW COMPARISON:  Chest x-rays dated 08/31/2017 and 08/30/2017. FINDINGS: Endotracheal tube remains well positioned with tip approximately 2-3 cm above the carina. LEFT IJ central line is stable in position with tip at the level of the mid SVC. Enteric tube passes below the diaphragm. The bilateral airspace opacities are not significantly changed compared to yesterday's exam, improved  compared to the earlier study of 08/30/2017. No new lung findings. No pneumothorax seen. IMPRESSION: 1. Bilateral airspace opacities, pulmonary edema versus pneumonia, not significantly changed compared to yesterday's exam, but improved compared to the earlier study of 08/30/2017. 2. Support apparatus remains appropriately positioned. Electronically Signed   By: Franki Cabot M.D.   On: 09/01/2017 07:28   Dg Chest Port 1 View  Result Date: 08/31/2017 CLINICAL DATA:  Evaluate ETT.  Respiratory distress. EXAM: PORTABLE CHEST 1 VIEW COMPARISON:  August 30, 2017 FINDINGS: The ETT and left central line are stable. The NG tube terminates below today's film. No pneumothorax. No nodules or masses. Bilateral infiltrates, right greater than left, are stable. IMPRESSION: 1. Support apparatus as above. 2. Persistent significant pulmonary infiltrates, right greater than left. Electronically Signed   By: Dorise Bullion III M.D   On: 08/31/2017 07:23   Dg Chest Port 1 View  Result Date: 08/30/2017 CLINICAL DATA:  LEFT-sided central line placement. EXAM: PORTABLE CHEST 1 VIEW COMPARISON:  08/30/2017. FINDINGS: Unchanged cardiomediastinal silhouette. Calcified tortuous aorta. Significant RIGHT greater than LEFT lung opacities. LEFT-sided hemodialysis catheter was placed via LEFT IJ approach. Tip lies at the cavoatrial junction. There is no pneumothorax. ET tube and nasogastric tube are unchanged. IMPRESSION: Satisfactory LEFT hemodialysis catheter placement. No pneumothorax. Stable aeration. Electronically Signed   By: Staci Righter M.D.   On: 08/30/2017 12:34    Anti-infectives: Anti-infectives (From admission, onward)   Start  Dose/Rate Route Frequency Ordered Stop   08/31/17 0800  piperacillin-tazobactam (ZOSYN) IVPB 3.375 g     3.375 g 100 mL/hr over 30 Minutes Intravenous Every 6 hours 08/31/17 0739     08/30/17 2000  Ampicillin-Sulbactam (UNASYN) 3 g in sodium chloride 0.9 % 100 mL IVPB  Status:   Discontinued     3 g 200 mL/hr over 30 Minutes Intravenous Every 24 hours 09/04/2017 0953 08/30/17 0945   08/30/17 1230  piperacillin-tazobactam (ZOSYN) IVPB 3.375 g  Status:  Discontinued     3.375 g 12.5 mL/hr over 240 Minutes Intravenous Every 12 hours 08/30/17 1134 08/31/17 0738   09/04/2017 1030  Ampicillin-Sulbactam (UNASYN) 3 g in sodium chloride 0.9 % 100 mL IVPB     3 g 200 mL/hr over 30 Minutes Intravenous  Once 08/31/2017 0953 09/06/2017 1246   08/25/2017 0000  ampicillin-sulbactam (UNASYN) 1.5 g in sodium chloride 0.9 % 100 mL IVPB  Status:  Discontinued     1.5 g 200 mL/hr over 30 Minutes Intravenous Once 08/28/17 1045 09/01/2017 0928   08/31/2017 2200  piperacillin-tazobactam (ZOSYN) IVPB 3.375 g    Note to Pharmacy:  Zosyn 3.375 g IV q12h in ESRD on HD   3.375 g 12.5 mL/hr over 240 Minutes Intravenous Every 12 hours 08/30/2017 1155 08/25/17 1335   08/22/17 1000  piperacillin-tazobactam (ZOSYN) IVPB 3.375 g  Status:  Discontinued    Note to Pharmacy:  Zosyn 3.375 g IV q12h in ESRD on HD   3.375 g 12.5 mL/hr over 240 Minutes Intravenous Every 12 hours 08/22/17 0128 08/27/2017 1155   08/22/17 0045  piperacillin-tazobactam (ZOSYN) IVPB 3.375 g     3.375 g 12.5 mL/hr over 240 Minutes Intravenous  Once 08/22/17 0038 08/22/17 0145      Assessment/Plan:  HTN ESRD - HD MWF H/o breast cancer  Acute cholecystitis POD8s/p lap chole4/6/19 by Dr. Redmond Pulling - CT scan 4/8 w/ small amount fluid around liver and layering in pelvis ; HIDA 4/9 positive for bile leak  -drain placed for biloma.  -still no ERCP for stent placement yet.  Now with likely NSTEMI.  Defer timing of ERCP to GI. -cont OG while intubated.  May be able to start TFs at some point in the future  VDRF secondary to aspiration/PNA -CCM following - appreciate assistance. Started on Unasyn  NSTEMI -per CCM  ID -zosyn 4/4>>4/7,Unasyn 4/11--> FEN -NPO/OGT VTE -SCDs, heparin gtt Foley -per CCM Follow up -Dr.  Redmond Pulling  Plan-  Doing ok. Needs ERCP once improving to address bile leak but at this time, it is drained   LOS: 10 days    Ileana Roup 09/01/2017

## 2017-09-01 NOTE — Progress Notes (Signed)
[facility] Progress Note   Subjective:   On vent, back on pressors,  WBC climbing, new hypothermia episode overnight w/ temp's down to 93 deg F.  Tracking some today which is better.   Objective Vitals:   09/01/17 1024 09/01/17 1026 09/01/17 1030 09/01/17 1045  BP:   (!) 108/48 (!) 98/50  Pulse: 84  92 91  Resp: (!) 24  20 (!) 22  Temp:      TempSrc:      SpO2: 100% 100% 100% 100%  Weight:      Height:       Physical Exam General: does some tracking today which is new, on vent, frail elderly AAF Heart:tachycardia, RR, 2/6 systolic murmur. No rub or gallop Lungs:CTAB  Abdomen:soft, NTND, biliary drain in place Extremities:no edema b/l LE's, 2-3+ LUE edema Dialysis Access: LUA AVG +b/t , L IJ temp cath running CRRT   Dialysis Orders: [facility]WF [facility] 3h 76mn 400/A1.5 61.5kg 2/2.25 bath P4 AVG LUE Hep 4000 - Hectoral 26m IV q HD - Mircera 3018mIV q 2 weeks (last 3/27)   Assessment/Plan: 1 Acute cholecystitis - s/p lap chole 4/6 Dr. WilRedmond Pullingost op bile duct leak sp IR biliary drain 4/11 2 Acute resp failure/ asp PNA - on vent, dense R sided infiltrates on CXR, septic shock. IV Zosyn 3 Septic shock - still not doing well, hypothermic overnight, WBC climbing, back on pressors.  Overall prognosis still poor.  Family / son hoping for recovery, have d/w son at bedside, she has not turned the corner yet.  4 ESRD - have transitioned to CRRT due to septic shock, D#2, no issues, keep even for now w/ sepsis on pressors 5 Anemia of CKD- Hgb low. Aranesp 100m27miven 4/8. Will ^ darbe 150 q monday, next dose tomorrow. Transfusing today for Hb 6.0.  6 MBD ckd - phos is low due to CRRT, have ordered IV phos 20 mmoles 7 Positive troponin- trend- too unstable for intervention, seen by cardiology   Rob Kelly [facility] ([phone]/13/2019, 11:21 AM      Filed Weights   08/30/17 0500 08/31/17 2000 09/01/17 0430  Weight: 61.6 kg  (135 lb 12.9 oz) 68.5 kg (151 lb 0.2 oz) 65.6 kg (144 lb 10 oz)    Intake/Output Summary (Last 24 hours) at 09/01/2017 1103 Last data filed at 09/01/2017 1030 Gross per 24 hour  Intake 1975.49 ml  Output 2896 ml  Net -920.51 ml    Additional Objective Labs: Basic Metabolic Panel: Recent Labs  Lab 08/31/17 0315 08/31/17 1552 09/01/17 0357  NA 135 137 136  K 5.0 3.7 3.8  CL 100* 105 102  CO2 21* 22 26  GLUCOSE 143* 350* 99  BUN 40* 23* 12  CREATININE 4.96* 3.00* 1.88*  CALCIUM 8.5* 7.9* 8.0*  PHOS 5.4* 3.0 1.8*   Liver Function Tests: Recent Labs  Lab 08/30/17 1100 08/31/17 0315 08/31/17 1552 09/01/17 0357  AST 607* 2,629*  --  1,244*  ALT 221* 912*  --  689*  ALKPHOS 201* 253*  --  260*  BILITOT 3.6* 3.5*  --  3.2*  PROT 5.4* 5.6*  --  5.2*  ALBUMIN 1.9* 1.9*  1.9* 1.7* 1.7*  1.7*   No results for input(s): LIPASE, AMYLASE in the last 168 hours. CBC: Recent Labs  Lab 09/07/2017 0630 09/07/2017 1236 08/30/17 0454 08/31/17 0315 09/01/17 0357  WBC 16.8* 9.3 18.5* 25.6* 27.7*  HGB 7.5* 7.6* 8.1* 7.4*  6.0*  HCT 22.3* 22.6* 24.1* 21.8* 17.7*  MCV 87.5 88.6 89.3 88.6 86.8  PLT 434* 318 320 252 157   Blood Culture    Component Value Date/Time   SDES BILE 09/16/2017 1706   SDES BILE 09/12/2017 1706   SPECREQUEST BOTTLES DRAWN AEROBIC AND ANAEROBIC 08/27/2017 1706   SPECREQUEST NONE 08/28/2017 1706   CULT  09/03/2017 1706    NO GROWTH 2 DAYS Performed at [facility], [place] [address] [place],  [address]    REPTSTATUS PENDING 09/15/2017 1706   REPTSTATUS 09/12/2017 FINAL 09/08/2017 1706    CBG: Recent Labs  Lab 08/31/17 1556 08/31/17 1928 09/01/17 0016 09/01/17 0424 09/01/17 0735  GLUCAP 228* 85 80 98 97    Lab Results  Component Value Date   INR 1.53 08/28/2017   INR 1.34 04/10/2010   INR 1.13 04/03/2010   Studies/Results:  Medications: . sodium chloride 10 mL/hr at 09/01/17 1000  . sodium chloride    . dextrose 40 mL/hr at  09/01/17 1000  . fentaNYL infusion INTRAVENOUS Stopped (08/31/17 1132)  . heparin    . norepinephrine (LEVOPHED) Adult infusion Stopped (09/01/17 1030)  . piperacillin-tazobactam Stopped (09/01/17 0830)  . dialysis replacement fluid (prismasate) 400 mL/hr at 08/31/17 2240  . dialysis replacement fluid (prismasate) 400 mL/hr at 08/31/17 2242  . dialysate (PRISMASATE) 2,000 mL/hr at 09/01/17 0835  . sodium glycerophosphate 0.9% NaCl IVPB     . aspirin  81 mg Per Tube Daily  . chlorhexidine gluconate (MEDLINE KIT)  15 mL Mouth Rinse BID  . Chlorhexidine Gluconate Cloth  6 each Topical Daily  . doxercalciferol  2 mcg Intravenous Q M,W,F-HD  . insulin aspart  0-9 Units Subcutaneous Q4H  . mouth rinse  15 mL Mouth Rinse 10 times per day  . pantoprazole (PROTONIX) IV  40 mg Intravenous Daily  . sodium chloride flush  10-40 mL Intracatheter Q12H

## 2017-09-01 NOTE — Progress Notes (Signed)
RT note-ABG performed and results given to MD.

## 2017-09-02 ENCOUNTER — Inpatient Hospital Stay (HOSPITAL_COMMUNITY): Payer: Medicare Other

## 2017-09-02 DIAGNOSIS — K9189 Other postprocedural complications and disorders of digestive system: Secondary | ICD-10-CM

## 2017-09-02 DIAGNOSIS — K7200 Acute and subacute hepatic failure without coma: Secondary | ICD-10-CM

## 2017-09-02 DIAGNOSIS — T17908S Unspecified foreign body in respiratory tract, part unspecified causing other injury, sequela: Secondary | ICD-10-CM

## 2017-09-02 LAB — GLUCOSE, CAPILLARY
GLUCOSE-CAPILLARY: 124 mg/dL — AB (ref 65–99)
Glucose-Capillary: 98 mg/dL (ref 65–99)
Glucose-Capillary: 122 mg/dL — ABNORMAL HIGH (ref 65–99)
Glucose-Capillary: 84 mg/dL (ref 65–99)

## 2017-09-02 LAB — BPAM RBC
Blood Product Expiration Date: 201905132359
Blood Product Expiration Date: 201905172359
ISSUE DATE / TIME: 201904141002
ISSUE DATE / TIME: 201904140635
UNIT TYPE AND RH: 9500
Unit Type and Rh: 9500

## 2017-09-02 LAB — HEPATIC FUNCTION PANEL
ALT: 515 U/L — ABNORMAL HIGH (ref 14–54)
AST: 652 U/L — AB (ref 15–41)
Albumin: 1.8 g/dL — ABNORMAL LOW (ref 3.5–5.0)
Alkaline Phosphatase: 336 U/L — ABNORMAL HIGH (ref 38–126)
BILIRUBIN DIRECT: 2.7 mg/dL — AB (ref 0.1–0.5)
BILIRUBIN INDIRECT: 1.5 mg/dL — AB (ref 0.3–0.9)
Total Bilirubin: 4.2 mg/dL — ABNORMAL HIGH (ref 0.3–1.2)
Total Protein: 5.7 g/dL — ABNORMAL LOW (ref 6.5–8.1)

## 2017-09-02 LAB — RENAL FUNCTION PANEL
ALBUMIN: 1.7 g/dL — AB (ref 3.5–5.0)
ALBUMIN: 1.7 g/dL — AB (ref 3.5–5.0)
Anion gap: 8 (ref 5–15)
Anion gap: 10 (ref 5–15)
BUN: 8 mg/dL (ref 6–20)
BUN: 6 mg/dL (ref 6–20)
CO2: 26 mmol/L (ref 22–32)
CO2: 24 mmol/L (ref 22–32)
CREATININE: 1.32 mg/dL — AB (ref 0.44–1.00)
CREATININE: 1.54 mg/dL — AB (ref 0.44–1.00)
Calcium: 7.8 mg/dL — ABNORMAL LOW (ref 8.9–10.3)
Calcium: 7.9 mg/dL — ABNORMAL LOW (ref 8.9–10.3)
Chloride: 99 mmol/L — ABNORMAL LOW (ref 101–111)
Chloride: 97 mmol/L — ABNORMAL LOW (ref 101–111)
GFR calc Af Amer: 35 mL/min — ABNORMAL LOW (ref >60)
GFR, EST AFRICAN AMERICAN: 42 mL/min — AB (ref >60)
GFR, EST NON AFRICAN AMERICAN: 36 mL/min — AB (ref >60)
GFR, EST NON AFRICAN AMERICAN: 30 mL/min — AB (ref >60)
Glucose, Bld: 120 mg/dL — ABNORMAL HIGH (ref 65–99)
Glucose, Bld: 86 mg/dL (ref 65–99)
PHOSPHORUS: 2 mg/dL — AB (ref 2.5–4.6)
PHOSPHORUS: 3.3 mg/dL (ref 2.5–4.6)
Potassium: 3.6 mmol/L (ref 3.5–5.1)
Potassium: 4 mmol/L (ref 3.5–5.1)
Sodium: 133 mmol/L — ABNORMAL LOW (ref 135–145)
Sodium: 131 mmol/L — ABNORMAL LOW (ref 135–145)

## 2017-09-02 LAB — POCT I-STAT 3, ART BLOOD GAS (G3+)
ACID-BASE DEFICIT: 1 mmol/L (ref 0.0–2.0)
Bicarbonate: 25.4 mmol/L (ref 20.0–28.0)
O2 SAT: 97 %
PH ART: 7.307 — AB (ref 7.350–7.450)
Patient temperature: 98.6
TCO2: 27 mmol/L (ref 22–32)
pCO2 arterial: 50.9 mmHg — ABNORMAL HIGH (ref 32.0–48.0)
pO2, Arterial: 97 mmHg (ref 83.0–108.0)

## 2017-09-02 LAB — TYPE AND SCREEN
ABO/RH(D): O NEG
ANTIBODY SCREEN: NEGATIVE
UNIT DIVISION: 0
Unit division: 0

## 2017-09-02 LAB — CBC
HEMATOCRIT: 29.3 % — AB (ref 36.0–46.0)
Hemoglobin: 10.1 g/dL — ABNORMAL LOW (ref 12.0–15.0)
MCH: 29.6 pg (ref 26.0–34.0)
MCHC: 34.5 g/dL (ref 30.0–36.0)
MCV: 85.9 fL (ref 78.0–100.0)
Platelets: 148 10*3/uL — ABNORMAL LOW (ref 150–400)
RBC: 3.41 MIL/uL — AB (ref 3.87–5.11)
RDW: 16.9 % — AB (ref 11.5–15.5)
WBC: 32.3 10*3/uL — AB (ref 4.0–10.5)

## 2017-09-02 LAB — MAGNESIUM: MAGNESIUM: 2.1 mg/dL (ref 1.7–2.4)

## 2017-09-02 MED ORDER — POTASSIUM PHOSPHATES 15 MMOLE/5ML IV SOLN
30.0000 mmol | Freq: Once | INTRAVENOUS | Status: AC
  Administered 2017-09-02: 30 mmol via INTRAVENOUS
  Filled 2017-09-02: qty 10

## 2017-09-02 MED ORDER — PANTOPRAZOLE SODIUM 40 MG PO PACK
40.0000 mg | PACK | Freq: Every day | ORAL | Status: DC
  Administered 2017-09-03 – 2017-09-08 (×5): 40 mg
  Filled 2017-09-02 (×6): qty 20

## 2017-09-02 MED ORDER — IOPAMIDOL (ISOVUE-300) INJECTION 61%
INTRAVENOUS | Status: AC
  Filled 2017-09-02: qty 100

## 2017-09-02 MED ORDER — IOPAMIDOL (ISOVUE-300) INJECTION 61%
80.0000 mL | Freq: Once | INTRAVENOUS | Status: AC | PRN
  Administered 2017-09-02: 100 mL via INTRAVENOUS

## 2017-09-02 NOTE — Progress Notes (Signed)
Progress Note   Subjective  Patient remains intubated, on levophed, continued hypothermia without warming blanket. HIDA done yesterday shows enlarging biloma. WBC rising, while LFTs improving. On CRRT   Objective   Vital signs in last 24 hours: Temp:  [94 F (34.4 C)-99 F (37.2 C)] 98.2 F (36.8 C) (04/15 0900) Pulse Rate:  [51-101] 74 (04/15 0900) Resp:  [12-37] 18 (04/15 0900) BP: (73-170)/(38-148) 111/46 (04/15 0900) SpO2:  [87 %-100 %] 96 % (04/15 0900) FiO2 (%):  [30 %] 30 % (04/15 0900) Weight:  [144 lb 2.9 oz (65.4 kg)] 144 lb 2.9 oz (65.4 kg) (04/15 0406) Last BM Date: 09/02/17 General:    AA female, sedated, intubated Heart:  Tachycardic, 2/6 SEM Lungs: relatively CTA, intubated Abdomen:  Soft, biliary drain in place Extremities:  Bilateral upper extremity edema more so than lower extremity Neurologic:  sedated  Intake/Output from previous day: 04/14 0701 - 04/15 0700 In: 3772.5 [I.V.:2436.5; Blood:794.1; NG/GT:70; IV Piggyback:472] Out: 2236 [Emesis/NG output:150; Drains:160] Intake/Output this shift: Total I/O In: 349.5 [I.V.:299.5; IV Piggyback:50] Out: 72 [Other:269]  Lab Results: Recent Labs    09/01/17 0357 09/01/17 1816 09/02/17 0407  WBC 27.7* 31.0* 32.3*  HGB 6.0* 9.5* 10.1*  HCT 17.7* 28.6* 29.3*  PLT 157 144* 148*   BMET Recent Labs    09/01/17 0357 09/01/17 1816 09/02/17 0407  NA 136 136 133*  K 3.8 3.8 3.6  CL 102 103 99*  CO2 26 25 26   GLUCOSE 99 107* 120*  BUN 12 8 8   CREATININE 1.88* 1.70* 1.32*  CALCIUM 8.0* 7.7* 7.8*   LFT Recent Labs    09/02/17 0407  PROT 5.7*  ALBUMIN 1.8*  1.7*  AST 652*  ALT 515*  ALKPHOS 336*  BILITOT 4.2*  BILIDIR 2.7*  IBILI 1.5*   PT/INR No results for input(s): LABPROT, INR in the last 72 hours.  Studies/Results: Nm Hepatobiliary Liver Func  Result Date: 09/01/2017 CLINICAL DATA:  Prior cholecystectomy. Postop bile leak. Previous percutaneous drainage catheter placement.  Evaluate for persistent bile leak. EXAM: NUCLEAR MEDICINE HEPATOBILIARY IMAGING TECHNIQUE: Sequential images of the abdomen were obtained out to 60 minutes following intravenous administration of radiopharmaceutical. RADIOPHARMACEUTICALS:  5.5 mCi Tc-40m  Choletec IV COMPARISON:  Prior hepatobiliary imaging on 08/27/2017 FINDINGS: Prompt uptake and excretion of activity by the liver is seen. Gallbladder activity is absent, consistent with prior cholecystectomy. Biliary activity initially accumulates and persists in a rounded collection in the right subhepatic space, which appears increased in size compared to previous study. There is no evidence of free intraperitoneal leakage of biliary activity. Subsequent images show passage of some biliary contrast into the small bowel. IMPRESSION: Excreted biliary activity accumulates in a right subhepatic fluid collection, consistent with biloma. This appears increased in size compared to previous study. Consider abdomen pelvis CT for further evaluation. Electronically Signed   By: Earle Gell M.D.   On: 09/01/2017 15:57   US Abdomen Complete  Result Date: 09/01/2017 CLINICAL DATA:  Bile leak, postoperative. History of cholecystectomy. History of hypertension. EXAM: ABDOMEN ULTRASOUND COMPLETE COMPARISON:  None. FINDINGS: Gallbladder: Surgically absent. Common bile duct: Diameter: 5 mm Liver: No focal lesion identified. Within normal limits in parenchymal echogenicity. Small amount of free fluid adjacent to the liver, approximating the gallbladder fossa. Portal vein is patent on color Doppler imaging with normal direction of blood flow towards the liver. IVC: No abnormality visualized. Pancreas: Visualized portion unremarkable. Spleen: Size and appearance within normal limits. Right Kidney: Length:  6 cm. Poorly seen. Echogenic suggesting chronic medical renal disease. Hypoechoic masses within the RIGHT kidney measuring 1.9 cm and 1 cm respectively, likely cysts. Left  Kidney: Not seen.  Appeared similarly atrophic on recent CT. Abdominal aorta: No aneurysm visualized. Other findings: Ascites within the LEFT upper quadrant. IMPRESSION: 1. Status post cholecystectomy. Small amount of fluid adjacent to the liver near the cholecystectomy site, and expected amount status post recent cholecystectomy, although nuclear medicine study of 08/27/2017 described a probable bile leak. 2. Atrophic kidneys. Probable RIGHT renal cysts, too small to definitively characterize. Cysts were described on previous CT report. 3. LEFT upper quadrant ascites. Electronically Signed   By: Franki Cabot M.D.   On: 09/01/2017 12:00   Dg Chest Port 1 View  Result Date: 09/02/2017 CLINICAL DATA:  Followup ventilator support. EXAM: PORTABLE CHEST 1 VIEW COMPARISON:  09/01/2017 FINDINGS: Endotracheal tube tip is 1.5 cm above the carina. Nasogastric tube enters the abdomen. Left internal jugular central line tip is unchanged, in the SVC above the right atrium. Bilateral airspace filling persists, right more than left, probably representing pneumonia. This shows mild generalized improvement. IMPRESSION: Lines and tubes well positioned. Persistent bilateral airspace filling, showing mild generalized improvement. Electronically Signed   By: Nelson Chimes M.D.   On: 09/02/2017 07:10   Dg Chest Port 1 View  Result Date: 09/01/2017 CLINICAL DATA:  Endotracheal tube present EXAM: PORTABLE CHEST 1 VIEW COMPARISON:  Chest x-rays dated 08/31/2017 and 08/30/2017. FINDINGS: Endotracheal tube remains well positioned with tip approximately 2-3 cm above the carina. LEFT IJ central line is stable in position with tip at the level of the mid SVC. Enteric tube passes below the diaphragm. The bilateral airspace opacities are not significantly changed compared to yesterday's exam, improved compared to the earlier study of 08/30/2017. No new lung findings. No pneumothorax seen. IMPRESSION: 1. Bilateral airspace opacities,  pulmonary edema versus pneumonia, not significantly changed compared to yesterday's exam, but improved compared to the earlier study of 08/30/2017. 2. Support apparatus remains appropriately positioned. Electronically Signed   By: Franki Cabot M.D.   On: 09/01/2017 07:28       Assessment / Plan:    84 y/o female with ESRD s/p lap chole on 4/6 with subsequent bile leak. Developed respiratory failure prior to ERCP attempt, intubated on pressors, course complicated by NSTEMI due to demand ischemia and shock liver. Fortunately EF okay on echo, ALT improving, while bili rising / lagging behind ALT improvement which may be expected post shock liver, or from bile leak. She continues to require pressors. Bile leak persistent in right subhepatic space on HIDA yesterday but no evidence of intraperitoneal leakage. Drain is in place per IR, on broad spectrum antibiotics.  She is critically ill, rising WBC is concerning. She does warrant an ERCP to more definitively treat bile leak but needs to be stable enough to tolerate this. I will discuss her case with Dr. Ardis Hughs who is covering for biliary endoscopy this week. We will continue to follow, continue supportive care otherwise.  Barnwell Cellar, MD [facility]

## 2017-09-02 NOTE — Progress Notes (Signed)
4 Days Post-Op   Subjective/Chief Complaint: Pt on vent back on pressors    Objective: Vital signs in last 24 hours: Temp:  [94 F (34.4 C)-99 F (37.2 C)] 98.2 F (36.8 C) (04/15 0900) Pulse Rate:  [51-101] 79 (04/15 1000) Resp:  [12-37] 24 (04/15 1000) BP: (73-170)/(38-148) 155/60 (04/15 1000) SpO2:  [87 %-100 %] 100 % (04/15 1000) FiO2 (%):  [30 %] 30 % (04/15 0900) Weight:  [65.4 kg (144 lb 2.9 oz)] 65.4 kg (144 lb 2.9 oz) (04/15 0406) Last BM Date: 09/02/17  Intake/Output from previous day: 04/14 0701 - 04/15 0700 In: 3772.5 [I.V.:2436.5; Blood:794.1; NG/GT:70; IV Piggyback:472] Out: 2236 [Emesis/NG output:150; Drains:160] Intake/Output this shift: Total I/O In: 472 [I.V.:362; NG/GT:60; IV Piggyback:50] Out: 407 [Other:407]  Incision/Wound:CDI drains bilious 160 cc  Lab Results:  Recent Labs    09/01/17 1816 09/02/17 0407  WBC 31.0* 32.3*  HGB 9.5* 10.1*  HCT 28.6* 29.3*  PLT 144* 148*   BMET Recent Labs    09/01/17 1816 09/02/17 0407  NA 136 133*  K 3.8 3.6  CL 103 99*  CO2 25 26  GLUCOSE 107* 120*  BUN 8 8  CREATININE 1.70* 1.32*  CALCIUM 7.7* 7.8*   PT/INR No results for input(s): LABPROT, INR in the last 72 hours. ABG Recent Labs    09/01/17 1151 09/02/17 0342  PHART 7.323* 7.307*  HCO3 27.5 25.4    Studies/Results: Nm Hepatobiliary Liver Func  Result Date: 09/01/2017 CLINICAL DATA:  Prior cholecystectomy. Postop bile leak. Previous percutaneous drainage catheter placement. Evaluate for persistent bile leak. EXAM: NUCLEAR MEDICINE HEPATOBILIARY IMAGING TECHNIQUE: Sequential images of the abdomen were obtained out to 60 minutes following intravenous administration of radiopharmaceutical. RADIOPHARMACEUTICALS:  5.5 mCi Tc-10m Choletec IV COMPARISON:  Prior hepatobiliary imaging on 08/27/2017 FINDINGS: Prompt uptake and excretion of activity by the liver is seen. Gallbladder activity is absent, consistent with prior cholecystectomy. Biliary  activity initially accumulates and persists in a rounded collection in the right subhepatic space, which appears increased in size compared to previous study. There is no evidence of free intraperitoneal leakage of biliary activity. Subsequent images show passage of some biliary contrast into the small bowel. IMPRESSION: Excreted biliary activity accumulates in a right subhepatic fluid collection, consistent with biloma. This appears increased in size compared to previous study. Consider abdomen pelvis CT for further evaluation. Electronically Signed   By: JEarle GellM.D.   On: 09/01/2017 15:57   UKoreaAbdomen Complete  Result Date: 09/01/2017 CLINICAL DATA:  Bile leak, postoperative. History of cholecystectomy. History of hypertension. EXAM: ABDOMEN ULTRASOUND COMPLETE COMPARISON:  None. FINDINGS: Gallbladder: Surgically absent. Common bile duct: Diameter: 5 mm Liver: No focal lesion identified. Within normal limits in parenchymal echogenicity. Small amount of free fluid adjacent to the liver, approximating the gallbladder fossa. Portal vein is patent on color Doppler imaging with normal direction of blood flow towards the liver. IVC: No abnormality visualized. Pancreas: Visualized portion unremarkable. Spleen: Size and appearance within normal limits. Right Kidney: Length: 6 cm. Poorly seen. Echogenic suggesting chronic medical renal disease. Hypoechoic masses within the RIGHT kidney measuring 1.9 cm and 1 cm respectively, likely cysts. Left Kidney: Not seen.  Appeared similarly atrophic on recent CT. Abdominal aorta: No aneurysm visualized. Other findings: Ascites within the LEFT upper quadrant. IMPRESSION: 1. Status post cholecystectomy. Small amount of fluid adjacent to the liver near the cholecystectomy site, and expected amount status post recent cholecystectomy, although nuclear medicine study of 08/27/2017 described a probable bile  leak. 2. Atrophic kidneys. Probable RIGHT renal cysts, too small to  definitively characterize. Cysts were described on previous CT report. 3. LEFT upper quadrant ascites. Electronically Signed   By: Franki Cabot M.D.   On: 09/01/2017 12:00   Dg Chest Port 1 View  Result Date: 09/02/2017 CLINICAL DATA:  Followup ventilator support. EXAM: PORTABLE CHEST 1 VIEW COMPARISON:  09/01/2017 FINDINGS: Endotracheal tube tip is 1.5 cm above the carina. Nasogastric tube enters the abdomen. Left internal jugular central line tip is unchanged, in the SVC above the right atrium. Bilateral airspace filling persists, right more than left, probably representing pneumonia. This shows mild generalized improvement. IMPRESSION: Lines and tubes well positioned. Persistent bilateral airspace filling, showing mild generalized improvement. Electronically Signed   By: Nelson Chimes M.D.   On: 09/02/2017 07:10   Dg Chest Port 1 View  Result Date: 09/01/2017 CLINICAL DATA:  Endotracheal tube present EXAM: PORTABLE CHEST 1 VIEW COMPARISON:  Chest x-rays dated 08/31/2017 and 08/30/2017. FINDINGS: Endotracheal tube remains well positioned with tip approximately 2-3 cm above the carina. LEFT IJ central line is stable in position with tip at the level of the mid SVC. Enteric tube passes below the diaphragm. The bilateral airspace opacities are not significantly changed compared to yesterday's exam, improved compared to the earlier study of 08/30/2017. No new lung findings. No pneumothorax seen. IMPRESSION: 1. Bilateral airspace opacities, pulmonary edema versus pneumonia, not significantly changed compared to yesterday's exam, but improved compared to the earlier study of 08/30/2017. 2. Support apparatus remains appropriately positioned. Electronically Signed   By: Franki Cabot M.D.   On: 09/01/2017 07:28    Anti-infectives: Anti-infectives (From admission, onward)   Start     Dose/Rate Route Frequency Ordered Stop   08/31/17 0800  piperacillin-tazobactam (ZOSYN) IVPB 3.375 g     3.375 g 100 mL/hr  over 30 Minutes Intravenous Every 6 hours 08/31/17 0739     08/30/17 2000  Ampicillin-Sulbactam (UNASYN) 3 g in sodium chloride 0.9 % 100 mL IVPB  Status:  Discontinued     3 g 200 mL/hr over 30 Minutes Intravenous Every 24 hours 08/22/2017 0953 08/30/17 0945   08/30/17 1230  piperacillin-tazobactam (ZOSYN) IVPB 3.375 g  Status:  Discontinued     3.375 g 12.5 mL/hr over 240 Minutes Intravenous Every 12 hours 08/30/17 1134 08/31/17 0738   09/07/2017 1030  Ampicillin-Sulbactam (UNASYN) 3 g in sodium chloride 0.9 % 100 mL IVPB     3 g 200 mL/hr over 30 Minutes Intravenous  Once 09/09/2017 0953 09/07/2017 1246   09/04/2017 0000  ampicillin-sulbactam (UNASYN) 1.5 g in sodium chloride 0.9 % 100 mL IVPB  Status:  Discontinued     1.5 g 200 mL/hr over 30 Minutes Intravenous Once 08/28/17 1045 09/07/2017 0928   08/20/2017 2200  piperacillin-tazobactam (ZOSYN) IVPB 3.375 g    Note to Pharmacy:  Zosyn 3.375 g IV q12h in ESRD on HD   3.375 g 12.5 mL/hr over 240 Minutes Intravenous Every 12 hours 09/02/2017 1155 08/25/17 1335   08/22/17 1000  piperacillin-tazobactam (ZOSYN) IVPB 3.375 g  Status:  Discontinued    Note to Pharmacy:  Zosyn 3.375 g IV q12h in ESRD on HD   3.375 g 12.5 mL/hr over 240 Minutes Intravenous Every 12 hours 08/22/17 0128 09/02/2017 1155   08/22/17 0045  piperacillin-tazobactam (ZOSYN) IVPB 3.375 g     3.375 g 12.5 mL/hr over 240 Minutes Intravenous  Once 08/22/17 0038 08/22/17 0145      Assessment/Plan: Patient Active  Problem List   Diagnosis Date Noted  . Shock liver   . Malnutrition of moderate degree 08/30/2017  . Septic shock ([place])   . NSTEMI (non-ST elevated myocardial infarction) ([place])   . Acute respiratory failure with hypoxia ([place])   . Aspiration into airway   . ESRD on hemodialysis ([place])   . Bile leak   . Acute cholecystitis 08/22/2017  . Accelerated hypertension   . Hyperkalemia   . Syncope 09/14/2015  . Malignant neoplasm of upper-outer quadrant of left breast in female,  estrogen receptor positive ([place]) 04/07/2013  . ESRD on dialysis ([place]) 04/07/2013  . Essential hypertension 04/07/2013  . Gout 04/07/2013  . GERD (gastroesophageal reflux disease) 04/07/2013    S/P LAP CHOLE WITH BILE LEAK AND ASPIRATION WITH ATTEMPTED ERCP  No condition for ERCP  Will ask IR input about PTC as an alternative   Otherwise continue supportive care    LOS: 11 days    Marcello Moores A Marleena Shubert 09/02/2017

## 2017-09-02 NOTE — Progress Notes (Signed)
Patient ID: Pam Fuller, female   DOB: 1932-07-19, 84 y.o.   MRN: [id number]  Biloma drain placed in IR 4/11 CCM asked IR to review imaging and evaluate if IR has anything to offer  IMPRESSION: Excreted biliary activity accumulates in a right subhepatic fluid collection, consistent with biloma. This appears increased in size compared to previous study. Consider abdomen pelvis CT for further Evaluation.  CCS also asking IR for opinion as to what is recommendation   Wbc 32.3 Afeb Collection larger per Scan No growth  Dr Pascal Lux has reviewed imaging GI considering ERCP but in no condition for this procedure per note  Can IR consider Biliary drain? Dr Pascal Lux rec: CT--- if ducts where biliary drain could be placed He would consider placement  Await CT for now Re check chart and with CCS tomorrow

## 2017-09-02 NOTE — Progress Notes (Signed)
Pharmacy Antibiotic Note  Pam Fuller is a 84 y.o. female admitted on 08/23/2017 with concern for aspiration PNA during anesthesia for ERCP. Pharmacy has been consulted for Zosyn dosing with concern for sepsis. Note, CRRT started 4/13.   Plan: -Continue Zosyn 3.375g IV q6h over 30 min -Monitor cultures, renal plans, LOT  Height: 5\' 2"  (157.5 cm) Weight: 144 lb 2.9 oz (65.4 kg) IBW/kg (Calculated) : 50.1  Temp (24hrs), Avg:97.3 F (36.3 C), Min:94 F (34.4 C), Max:99 F (37.2 C)  Recent Labs  Lab 08/30/17 0454 08/30/17 1100 08/30/17 1501 08/31/17 0315 08/31/17 0906 08/31/17 1552 09/01/17 0018 09/01/17 0357 09/01/17 1816 09/02/17 0407  WBC 18.5*  --   --  25.6*  --   --   --  27.7* 31.0* 32.3*  CREATININE 4.50*  --   --  4.96*  --  3.00*  --  1.88* 1.70* 1.32*  LATICACIDVEN 2.1* 5.6* 4.7*  --  1.1  --  1.8  --   --   --     Estimated Creatinine Clearance: 29.2 mL/min (A) (by C-G formula based on SCr of 1.32 mg/dL (H)).    Allergies  Allergen Reactions  . Erythromycin Nausea And Vomiting  . Vicodin [Hydrocodone-Acetaminophen]     unknown    Antimicrobials this admission: 4/11 Unasyn x1 4/12 Zosyn >>  Dose adjustments this admission: 4/13: CRRT initiated > 3.375g IV q6h over 30 min  Microbiology results: 4/11 MRSA: negative 4/11 Trach aspirate: NGTD 4/11 Bile Cx: NGTD  Thank you for allowing pharmacy to be a part of this patient's care.  Arrie Senate, PharmD, BCPS PGY-2 Cardiology Pharmacy Resident Pager: [phone] 09/02/2017

## 2017-09-02 NOTE — Progress Notes (Signed)
Pt off CRRT at 1700 for CT. Unable to complete CT due to IV access. IV team consulted. Renal MD, Dr. Marval Regal, paged about pt being off CRRT. Informed that this was OK.

## 2017-09-02 NOTE — Progress Notes (Signed)
[facility] Progress Note   Subjective:   On vent, on pressor x 1. +1.5 L yest.   Objective Vitals:   09/02/17 0630 09/02/17 0645 09/02/17 0700 09/02/17 0800  BP: (!) 124/46 (!) 100/40 (!) 127/47 (!) 124/48  Pulse: 67 65 65 78  Resp: 14 14 20 13   Temp:   97.7 F (36.5 C)   TempSrc:   Axillary   SpO2: 100% 100% 100% 98%  Weight:      Height:       Physical Exam General: sedated on the vent Heart:tachycardia, RR, 2/6 systolic murmur. No rub or gallop Lungs:CTAB  Abdomen:soft, NTND, biliary drain in place Extremities: bilat UE > LE edema 1-2+  Dialysis Access: LUA AVG +b/t , L IJ temp cath running CRRT   Dialysis Orders: [facility]WF [facility] 3h 56mn 400/A1.5 61.5kg 2/2.25 bath P4 AVG LUE Hep 4000 - Hectoral 239m IV q HD - Mircera 3075mIV q 2 weeks (last 3/27)   Assessment/Plan: 1 Acute cholecystitis - s/p lap chole 4/6 Dr. WilRedmond Pullingost op bile duct leak sp IR biliary drain 4/11 2 Acute resp failure/ asp PNA - on vent, dense R sided infiltrates on CXR. IV Zosyn 3 Septic shock - ^^wbc, hypothermic again last night to 94deg, on pressors x 1.   4  ESRD - usual HD is MWF.  Now is on CRRT due to shock, D#3, no new issues, keeping even for now w/ sepsis on pressors. 4kg up, CXR no edema.  5 Anemia of CKD- Hgb low. Aranesp 100m62miven 4/8. ^'d darbe 150 q monday, give today. SP prbc's.  6  MBD ckd - low phos due to CRRT, sp IV phos 20 mmoles on 4/14 7 Positive troponin- trend- too unstable for intervention, seen by cardiology 8 VDRF - on vent support, [facility] ([phone]/13/2019, 11:21 AM      Filed Weights   08/31/17 2000 09/01/17 0430 09/02/17 0406  Weight: 68.5 kg (151 lb 0.2 oz) 65.6 kg (144 lb 10 oz) 65.4 kg (144 lb 2.9 oz)    Intake/Output Summary (Last 24 hours) at 09/02/2017 0911 Last data filed at 09/02/2017 0900 Gross per 24 hour  Intake 3518.95 ml  Output 2172 ml  Net 1346.95 ml     Additional Objective Labs: Basic Metabolic Panel: Recent Labs  Lab 09/01/17 0357 09/01/17 1816 09/02/17 0407  NA 136 136 133*  K 3.8 3.8 3.6  CL 102 103 99*  CO2 26 25 26   GLUCOSE 99 107* 120*  BUN 12 8 8   CREATININE 1.88* 1.70* 1.32*  CALCIUM 8.0* 7.7* 7.8*  PHOS 1.8* 2.4* 2.0*   Liver Function Tests: Recent Labs  Lab 08/31/17 0315  09/01/17 0357 09/01/17 1816 09/02/17 0407  AST 2,629*  --  1,244*  --  652*  ALT 912*  --  689*  --  515*  ALKPHOS 253*  --  260*  --  336*  BILITOT 3.5*  --  3.2*  --  4.2*  PROT 5.6*  --  5.2*  --  5.7*  ALBUMIN 1.9*  1.9*   < > 1.7*  1.7* 1.6* 1.8*  1.7*   < > = values in this interval not displayed.   No results for input(s): LIPASE, AMYLASE in the last 168 hours. CBC: Recent Labs  Lab 08/30/17 0454 08/31/17 0315 09/01/17 0357 09/01/17 1816 09/02/17 0407  WBC 18.5* 25.6* 27.7* 31.0* 32.3*  HGB 8.1*  7.4* 6.0* 9.5* 10.1*  HCT 24.1* 21.8* 17.7* 28.6* 29.3*  MCV 89.3 88.6 86.8 85.4 85.9  PLT 320 252 157 144* 148*   Blood Culture    Component Value Date/Time   SDES BILE 09/04/2017 1706   SDES BILE 09/12/2017 1706   SPECREQUEST BOTTLES DRAWN AEROBIC AND ANAEROBIC 09/01/2017 1706   SPECREQUEST NONE 08/30/2017 1706   CULT  09/03/2017 1706    NO GROWTH 3 DAYS Performed at [facility], [place] [address] [place], [place] [address]    REPTSTATUS PENDING 08/26/2017 1706   REPTSTATUS 09/17/2017 FINAL 09/07/2017 1706    CBG: Recent Labs  Lab 09/01/17 1217 09/01/17 1645 09/01/17 1925 09/01/17 2336 09/02/17 0714  GLUCAP 103* 118* 108* 129* 124*    Lab Results  Component Value Date   INR 1.53 08/28/2017   INR 1.34 04/10/2010   INR 1.13 04/03/2010   Studies/Results:  Medications: . sodium chloride 10 mL/hr at 09/02/17 0700  . sodium chloride    . dexmedetomidine (PRECEDEX) IV infusion Stopped (09/02/17 0058)  . dextrose 40 mL/hr at 09/02/17 0700  . fentaNYL infusion INTRAVENOUS 100 mcg/hr (09/02/17  0733)  . heparin    . norepinephrine (LEVOPHED) Adult infusion 14 mcg/min (09/02/17 0733)  . piperacillin-tazobactam Stopped (09/02/17 0850)  . dialysis replacement fluid (prismasate) 400 mL/hr at 09/01/17 2202  . dialysis replacement fluid (prismasate) 400 mL/hr at 09/02/17 0313  . dialysate (PRISMASATE) 1,250 mL/hr at 09/02/17 0803   . aspirin  81 mg Per Tube Daily  . chlorhexidine gluconate (MEDLINE KIT)  15 mL Mouth Rinse BID  . Chlorhexidine Gluconate Cloth  6 each Topical Daily  . darbepoetin (ARANESP) injection - DIALYSIS  150 mcg Intravenous Q Mon-HD  . doxercalciferol  2 mcg Intravenous Q M,W,F-HD  . insulin aspart  0-9 Units Subcutaneous Q4H  . mouth rinse  15 mL Mouth Rinse 10 times per day  . pantoprazole (PROTONIX) IV  40 mg Intravenous Daily  . sodium chloride flush  10-40 mL Intracatheter Q12H

## 2017-09-02 NOTE — Progress Notes (Addendum)
PULMONARY / CRITICAL CARE MEDICINE   Name: Pam Fuller MRN: [id number] DOB: 1932/10/05    ADMISSION DATE:  08/20/2017 CONSULTATION DATE:  4/11  REFERRING MD:  Dr. Maudie Mercury  CHIEF COMPLAINT:  Aspiration  BRIEF SUMMARY:   Pam Fuller is a 84 year old female with ESRD on MWF HD, HTN, Anemia, and hx of breast cancer who presented to the ED on 4/4 with RLQ abdominal pain. She was found to have acute cholecystitis with a gallstone lodged in the gallbladder neck. CBD measured up to 10 mm. She underwent laparoscopic cholecystectomy on 4/6. She initially did well but on 4/8 was noted to have worsened pain and confusion and thought to have post-op ileus. CT abd/pelvis 4/8 showed small-mod fluid in the abdominal pelvis, read as normal post op changes but could not exclude a bile leak. A hepatobiliary scan was obtained on 4/9 which did suggest a bile leak. Surgery recommended IR drain and GI consult for ERCP. She went to the Endoscopy unit on 4/11 AM for ERCP however during prep with anesthesia she had significant bilious emesis with hypotension requiring intubation and pressor support. ERCP was not attempted. She was subsequently transferred to the medical ICU.   STUDIES:  CT abd/pelv 4/4 >> acute cholecystitis, gallstone in GB neck, CBD 10 mm  CT Head: no acute changes, chronic small vessel ischemic changes  CT abd/pelvis 4/8 >> small-mod fluid in abd/pelvis  Hepatobiliary scan 4/9 >> bile leak  TTE 4/12 >> EF 65-70%, G1DD, narrow LVOT, mild TR, mild pHTN  U/S Abdomen 4/14 >> small fluid near cholecystectomy site  Hepatobiliary scan 4/14 >> Rt subhepatic fluid collection consistent w/ biloma; increased in size from prior   CULTURES: Tracheal aspirate 4/11 >> no growth Abd fluid 4/11 >> no growth 3 days BCx 4/15 >>  ANTIBIOTICS: Zosyn 4/3 >> 4/7 Unasyn 4/11 Zosyn 4/12 >>  LINES/TUBES: ETT 4/11 OGT 4/11 PIV RUE 4/9 RLQ Biliary Drain 4/11 HD Cath [facility] 4/12  SIGNIFICANT  EVENTS: 4/4 - admitted w/ acute cholecystitis 4/6 - lap chole 4/9 - bile leak seen on hepatobiliary scan 4/11 - Aspirated during prep for ERCP, intubated and transferred to ICU 4/11 - Rt abd fluid drain placed by IR 4/12- Heparin gtt started for NSTEMI; held after OG bleeding and trop peak 4/13 - worsening liver enzyymes. ESRD - Anuric. CRRT started. On levophed 60mcg and fent 153mcg 4/14 - Hgb 6.0, transfused 2 units PRBCs   SUBJECTIVE/OVERNIGHT/INTERVAL HX Bradycardic on precedex overnight which was held. Required increased Fentanyl.   VITAL SIGNS: BP (!) 127/47   Pulse 65   Temp 97.7 F (36.5 C) (Axillary)   Resp 20   Ht 5\' 2"  (1.575 m)   Wt 144 lb 2.9 oz (65.4 kg)   SpO2 100%   BMI 26.37 kg/m   HEMODYNAMICS: CVP:  [13 mmHg] 13 mmHg  VENTILATOR SETTINGS: Vent Mode: PRVC FiO2 (%):  [30 %-40 %] 30 % Set Rate:  [18 bmp-27 bmp] 27 bmp Vt Set:  [300 mL-400 mL] 300 mL PEEP:  [5 cmH20] 5 cmH20 Plateau Pressure:  [15 cmH20-22 cmH20] 18 cmH20  INTAKE / OUTPUT: I/O last 3 completed shifts: In: 4727.6 [I.V.:3151.5; Blood:934.1; NG/GT:70; IV Piggyback:572] Out: 9702 [Emesis/NG output:350; Drains:460; Other:3255]  PHYSICAL EXAMINATION: General: elderly woman, intubated HEENT: ETT/OGT in place, LIJ HD cath in place Cardiac: RRR, no rubs, murmurs or gallops Pulm: rhonchi anteriorly R > L Abd: Rt abdominal fluid drain in place Ext: warm and well perfused, LUE AVF Neuro:  eyes open, not tracking this AM   PULMONARY Recent Labs  Lab 08/30/17 0252 08/31/17 0252 09/01/17 0834 09/01/17 1151 09/02/17 0342  PHART 7.369 7.275* 7.373 7.323* 7.307*  PCO2ART 53.7* 55.8* 49.3* 52.7* 50.9*  PO2ART 89.0 126.0* 161.0* 87.0 97.0  HCO3 31.0* 26.1 28.7* 27.5 25.4  TCO2 33* 28 30 29 27   O2SAT 96.0 98.0 99.0 96.0 97.0    CBC Recent Labs  Lab 09/01/17 0357 09/01/17 1816 09/02/17 0407  HGB 6.0* 9.5* 10.1*  HCT 17.7* 28.6* 29.3*  WBC 27.7* 31.0* 32.3*  PLT 157 144* 148*     COAGULATION Recent Labs  Lab 08/28/17 1258  INR 1.53    CARDIAC   Recent Labs  Lab 09/09/2017 1708 08/30/17 0057 08/30/17 0454 08/30/17 1100 08/31/17 0315  TROPONINI 1.40* 3.17* 4.47* 3.62* 2.86*   No results for input(s): PROBNP in the last 168 hours.   CHEMISTRY Recent Labs  Lab 08/30/17 0454 08/31/17 0315 08/31/17 1552 09/01/17 0357 09/01/17 1816 09/02/17 0407  NA 140 135 137 136 136 133*  K 4.1 5.0 3.7 3.8 3.8 3.6  CL 100* 100* 105 102 103 99*  CO2 23 21* 22 26 25 26   GLUCOSE 75 143* 350* 99 107* 120*  BUN 29* 40* 23* 12 8 8   CREATININE 4.50* 4.96* 3.00* 1.88* 1.70* 1.32*  CALCIUM 8.6* 8.5* 7.9* 8.0* 7.7* 7.8*  MG 2.1 2.2  --  2.1  --  2.1  PHOS 4.8* 5.4* 3.0 1.8* 2.4* 2.0*   Estimated Creatinine Clearance: 29.2 mL/min (A) (by C-G formula based on SCr of 1.32 mg/dL (H)).  I/O last 3 completed shifts: In: 4727.6 [I.V.:3151.5; Blood:934.1; NG/GT:70; IV Piggyback:572] Out: 0938 [Emesis/NG output:350; Drains:460; Other:3255]   Intake/Output Summary (Last 24 hours) at 09/02/2017 0719 Last data filed at 09/02/2017 0700 Gross per 24 hour  Intake 3772.54 ml  Output 2236 ml  Net 1536.54 ml     LIVER Recent Labs  Lab 08/28/17 1258 09/04/2017 0630  08/30/17 1100 08/31/17 0315 08/31/17 1552 09/01/17 0357 09/01/17 1816 09/02/17 0407  AST  --  22  --  607* 2,629*  --  1,244*  --  652*  ALT  --  23  --  221* 912*  --  689*  --  515*  ALKPHOS  --  197*  --  201* 253*  --  260*  --  336*  BILITOT  --  3.2*  --  3.6* 3.5*  --  3.2*  --  4.2*  PROT  --  6.6  --  5.4* 5.6*  --  5.2*  --  5.7*  ALBUMIN  --  2.5*   < > 1.9* 1.9*  1.9* 1.7* 1.7*  1.7* 1.6* 1.8*  1.7*  INR 1.53  --   --   --   --   --   --   --   --    < > = values in this interval not displayed.     INFECTIOUS Recent Labs  Lab 08/30/17 1501 08/31/17 0906 09/01/17 0018  LATICACIDVEN 4.7* 1.1 1.8     ENDOCRINE CBG (last 3)  Recent Labs    09/01/17 1925 09/01/17 2336  09/02/17 0714  GLUCAP 108* 129* 124*         IMAGING x48h  - image(s) personally visualized  -   highlighted in bold Nm Hepatobiliary Liver Func  Result Date: 09/01/2017 CLINICAL DATA:  Prior cholecystectomy. Postop bile leak. Previous percutaneous drainage catheter placement. Evaluate for persistent bile leak. EXAM:  NUCLEAR MEDICINE HEPATOBILIARY IMAGING TECHNIQUE: Sequential images of the abdomen were obtained out to 60 minutes following intravenous administration of radiopharmaceutical. RADIOPHARMACEUTICALS:  5.5 mCi Tc-89m  Choletec IV COMPARISON:  Prior hepatobiliary imaging on 08/27/2017 FINDINGS: Prompt uptake and excretion of activity by the liver is seen. Gallbladder activity is absent, consistent with prior cholecystectomy. Biliary activity initially accumulates and persists in a rounded collection in the right subhepatic space, which appears increased in size compared to previous study. There is no evidence of free intraperitoneal leakage of biliary activity. Subsequent images show passage of some biliary contrast into the small bowel. IMPRESSION: Excreted biliary activity accumulates in a right subhepatic fluid collection, consistent with biloma. This appears increased in size compared to previous study. Consider abdomen pelvis CT for further evaluation. Electronically Signed   By: Earle Gell M.D.   On: 09/01/2017 15:57   US Abdomen Complete  Result Date: 09/01/2017 CLINICAL DATA:  Bile leak, postoperative. History of cholecystectomy. History of hypertension. EXAM: ABDOMEN ULTRASOUND COMPLETE COMPARISON:  None. FINDINGS: Gallbladder: Surgically absent. Common bile duct: Diameter: 5 mm Liver: No focal lesion identified. Within normal limits in parenchymal echogenicity. Small amount of free fluid adjacent to the liver, approximating the gallbladder fossa. Portal vein is patent on color Doppler imaging with normal direction of blood flow towards the liver. IVC: No abnormality visualized.  Pancreas: Visualized portion unremarkable. Spleen: Size and appearance within normal limits. Right Kidney: Length: 6 cm. Poorly seen. Echogenic suggesting chronic medical renal disease. Hypoechoic masses within the RIGHT kidney measuring 1.9 cm and 1 cm respectively, likely cysts. Left Kidney: Not seen.  Appeared similarly atrophic on recent CT. Abdominal aorta: No aneurysm visualized. Other findings: Ascites within the LEFT upper quadrant. IMPRESSION: 1. Status post cholecystectomy. Small amount of fluid adjacent to the liver near the cholecystectomy site, and expected amount status post recent cholecystectomy, although nuclear medicine study of 08/27/2017 described a probable bile leak. 2. Atrophic kidneys. Probable RIGHT renal cysts, too small to definitively characterize. Cysts were described on previous CT report. 3. LEFT upper quadrant ascites. Electronically Signed   By: Franki Cabot M.D.   On: 09/01/2017 12:00   Dg Chest Port 1 View  Result Date: 09/02/2017 CLINICAL DATA:  Followup ventilator support. EXAM: PORTABLE CHEST 1 VIEW COMPARISON:  09/01/2017 FINDINGS: Endotracheal tube tip is 1.5 cm above the carina. Nasogastric tube enters the abdomen. Left internal jugular central line tip is unchanged, in the SVC above the right atrium. Bilateral airspace filling persists, right more than left, probably representing pneumonia. This shows mild generalized improvement. IMPRESSION: Lines and tubes well positioned. Persistent bilateral airspace filling, showing mild generalized improvement. Electronically Signed   By: Nelson Chimes M.D.   On: 09/02/2017 07:10   Dg Chest Port 1 View  Result Date: 09/01/2017 CLINICAL DATA:  Endotracheal tube present EXAM: PORTABLE CHEST 1 VIEW COMPARISON:  Chest x-rays dated 08/31/2017 and 08/30/2017. FINDINGS: Endotracheal tube remains well positioned with tip approximately 2-3 cm above the carina. LEFT IJ central line is stable in position with tip at the level of the mid  SVC. Enteric tube passes below the diaphragm. The bilateral airspace opacities are not significantly changed compared to yesterday's exam, improved compared to the earlier study of 08/30/2017. No new lung findings. No pneumothorax seen. IMPRESSION: 1. Bilateral airspace opacities, pulmonary edema versus pneumonia, not significantly changed compared to yesterday's exam, but improved compared to the earlier study of 08/30/2017. 2. Support apparatus remains appropriately positioned. Electronically Signed   By: Franki Cabot  M.D.   On: 09/01/2017 07:28     DISCUSSION: Pam Fuller is a 84 year old female with ESRD on MWF HD, HTN, Anemia, and hx of breast cancer who was admitted 4/4 with acute cholecystitis. S/p lap chole 4/6 complicated by post-op bile leak. Went for ERCP 4/11 however developed bilious emesis during prep, hypotension, and required intubation plus pressor support. Transferred to the ICU on 4/11.  ASSESSMENT / PLAN: PULMONARY A: Acute Respiratory Distress 2/2 Aspiration Intubated 4/11 P:   Continue vent support - 6 cc/kg IBW for ARDS appearance Wean and SBT as able Monitor CXR/ABG  CARDIOVASCULAR A:  #baseline Aortic Atherosclerosis on CXR RBBB - present prior to admission #current Septic Shock/Hypotension NSTEMI 2/2 demand ischemia in setting of hypotensive event Echocardiogram w/ EF 65-70% Prolonged QTc P:  Continue levophed for MAP >65, titrate off as able Hold subq heparin with low Hgb  RENAL A:   ESRD on MWF HD P:   CRRT per renal  GASTROINTESTINAL A:   Acute Cholecystitis s/p lap chole 4/6 Bile leak s/p IR biliary drain 4/11 - biloma by HIDA 4/14 Shock Liver P:   Biliary drain in place GI following - ERCP if able this week IR to review whether further imaging needed to characterize biloma and/or positional change of fluid drain - appreciate assitance  HEMATOLOGIC A:   Hx of Breast Cancer - no acute issue Anemia of Chronic Disease - Hgb 6.0 -  transfused 2 units PRBCs 4/14 Leukocytosis P:  Monitor Hgb, transfuse for Hgb <7.0 Monitor for bleeding  INFECTIOUS A:   Septic Shock from Aspiration PNA Bile Leak s/p IR Biliary Drain 4/11 P:   Zosyn - add Vancomycin if further decompensation Check blood and body fluid cultures  ENDOCRINE A:   Secondary Hyperparathyroidism   P:   Intermittent CBG monitoring  NEUROLOGIC A:   Pain/Agitation P:   RASS goal: 0 to -2 Fentanyl gtt Precedex Versed prn   FAMILY  - Updates: Son and brother updated at bedside 4/15  - Inter-disciplinary family meet or Palliative Care meeting due by:  09/05/17   Zada Finders, MD Internal Medicine PGY-3  09/02/2017 7:19 AM

## 2017-09-02 NOTE — Progress Notes (Signed)
RT note: RT transported patient on vent to CT and back with RN. Vital signs stable at this time.

## 2017-09-03 ENCOUNTER — Inpatient Hospital Stay (HOSPITAL_COMMUNITY): Payer: Medicare Other

## 2017-09-03 ENCOUNTER — Encounter (HOSPITAL_COMMUNITY): Payer: Medicare Other

## 2017-09-03 LAB — RENAL FUNCTION PANEL
ALBUMIN: 1.7 g/dL — AB (ref 3.5–5.0)
ANION GAP: 9 (ref 5–15)
Albumin: 1.7 g/dL — ABNORMAL LOW (ref 3.5–5.0)
Anion gap: 8 (ref 5–15)
BUN: 8 mg/dL (ref 6–20)
BUN: 8 mg/dL (ref 6–20)
CALCIUM: 8 mg/dL — AB (ref 8.9–10.3)
CHLORIDE: 99 mmol/L — AB (ref 101–111)
CO2: 25 mmol/L (ref 22–32)
CO2: 25 mmol/L (ref 22–32)
Calcium: 7.8 mg/dL — ABNORMAL LOW (ref 8.9–10.3)
Chloride: 98 mmol/L — ABNORMAL LOW (ref 101–111)
Creatinine, Ser: 1.53 mg/dL — ABNORMAL HIGH (ref 0.44–1.00)
Creatinine, Ser: 1.35 mg/dL — ABNORMAL HIGH (ref 0.44–1.00)
GFR calc Af Amer: 35 mL/min — ABNORMAL LOW (ref >60)
GFR calc Af Amer: 41 mL/min — ABNORMAL LOW (ref >60)
GFR, EST NON AFRICAN AMERICAN: 31 mL/min — AB (ref >60)
GFR, EST NON AFRICAN AMERICAN: 35 mL/min — AB (ref >60)
GLUCOSE: 112 mg/dL — AB (ref 65–99)
Glucose, Bld: 109 mg/dL — ABNORMAL HIGH (ref 65–99)
PHOSPHORUS: 2.3 mg/dL — AB (ref 2.5–4.6)
POTASSIUM: 4 mmol/L (ref 3.5–5.1)
Phosphorus: 2.9 mg/dL (ref 2.5–4.6)
Potassium: 3.7 mmol/L (ref 3.5–5.1)
SODIUM: 132 mmol/L — AB (ref 135–145)
Sodium: 132 mmol/L — ABNORMAL LOW (ref 135–145)

## 2017-09-03 LAB — GLUCOSE, CAPILLARY
GLUCOSE-CAPILLARY: 103 mg/dL — AB (ref 65–99)
GLUCOSE-CAPILLARY: 60 mg/dL — AB (ref 65–99)
GLUCOSE-CAPILLARY: 86 mg/dL (ref 65–99)
GLUCOSE-CAPILLARY: 91 mg/dL (ref 65–99)
GLUCOSE-CAPILLARY: 93 mg/dL (ref 65–99)
Glucose-Capillary: 104 mg/dL — ABNORMAL HIGH (ref 65–99)
Glucose-Capillary: 96 mg/dL (ref 65–99)

## 2017-09-03 LAB — BLOOD GAS, ARTERIAL
Acid-Base Excess: 1.6 mmol/L (ref 0.0–2.0)
BICARBONATE: 26.8 mmol/L (ref 20.0–28.0)
DRAWN BY: 51702
FIO2: 30
O2 Saturation: 97.3 %
PATIENT TEMPERATURE: 97.4
PCO2 ART: 48.8 mmHg — AB (ref 32.0–48.0)
PO2 ART: 90.3 mmHg (ref 83.0–108.0)
RATE: 27 resp/min
VT: 300 mL
pH, Arterial: 7.354 (ref 7.350–7.450)

## 2017-09-03 LAB — CBC
HCT: 28.4 % — ABNORMAL LOW (ref 36.0–46.0)
Hemoglobin: 10 g/dL — ABNORMAL LOW (ref 12.0–15.0)
MCH: 30.5 pg (ref 26.0–34.0)
MCHC: 35.2 g/dL (ref 30.0–36.0)
MCV: 86.6 fL (ref 78.0–100.0)
PLATELETS: 120 10*3/uL — AB (ref 150–400)
RBC: 3.28 MIL/uL — ABNORMAL LOW (ref 3.87–5.11)
RDW: 16.9 % — AB (ref 11.5–15.5)
WBC: 26.4 10*3/uL — AB (ref 4.0–10.5)

## 2017-09-03 LAB — HEPATIC FUNCTION PANEL
ALT: 337 U/L — ABNORMAL HIGH (ref 14–54)
AST: 315 U/L — AB (ref 15–41)
Albumin: 1.6 g/dL — ABNORMAL LOW (ref 3.5–5.0)
Alkaline Phosphatase: 442 U/L — ABNORMAL HIGH (ref 38–126)
Bilirubin, Direct: 3.3 mg/dL — ABNORMAL HIGH (ref 0.1–0.5)
Indirect Bilirubin: 1.6 mg/dL — ABNORMAL HIGH (ref 0.3–0.9)
TOTAL PROTEIN: 5.8 g/dL — AB (ref 6.5–8.1)
Total Bilirubin: 4.9 mg/dL — ABNORMAL HIGH (ref 0.3–1.2)

## 2017-09-03 LAB — CULTURE, BODY FLUID-BOTTLE: CULTURE: NO GROWTH

## 2017-09-03 LAB — CULTURE, BODY FLUID W GRAM STAIN -BOTTLE

## 2017-09-03 LAB — MAGNESIUM: MAGNESIUM: 2 mg/dL (ref 1.7–2.4)

## 2017-09-03 MED ORDER — IOPAMIDOL (ISOVUE-300) INJECTION 61%
INTRAVENOUS | Status: AC
  Filled 2017-09-03: qty 30

## 2017-09-03 MED ORDER — DEXTROSE 50 % IV SOLN
INTRAVENOUS | Status: AC
  Administered 2017-09-03: 21:00:00
  Filled 2017-09-03: qty 50

## 2017-09-03 NOTE — Progress Notes (Signed)
Transported pt to and from CT scan on 100% FIO2 and full support.  Tolerated well and stable throughout.  Placed back on prior FIO2 upon return to patient's room.

## 2017-09-03 NOTE — Progress Notes (Signed)
[name] Progress Note   Subjective:   On vent, off pressors as of this am.  Not following commands.  Objective Vitals:   09/03/17 0900 09/03/17 0930 09/03/17 1000 09/03/17 1030  BP: (!) 107/49 (!) 96/52 (!) 95/47 (!) 130/57  Pulse: 74 75 76 82  Resp: 18 (!) 23 18 19   Temp:      TempSrc:      SpO2: 100% 100% 100% 100%  Weight:      Height:       Physical Exam General: sedated on the vent, not following commands Heart:tachycardia, RR, 2/6 systolic murmur. No rub or gallop Lungs:CTAB  Abdomen:soft, NTND, biliary drain in place Extremities: bilat UE > LE edema 1-2+  Dialysis Access: LUA AVG +b/t , L IJ temp cath running CRRT   Dialysis Orders: [facility] 3h 2mn 400/A1.5 61.5kg 2/2.25 bath P4 AVG LUE Hep 4000 - Hectoral 284m IV q HD - Mircera 3048mIV q 2 weeks (last 3/27)   Assessment/Plan: 1 Acute cholecystitis - s/p lap chole 4/6. Post op bile duct leak sp IR biliary drain 4/11 2 Acute resp failure/ asp PNA - on vent, dense infiltrates on CXR improving. IV zosyn.  3 Septic shock - starting to improve perhaps, off pressors today   4 ESRD - usual HD is MWF.  CRRT due to shock. Shock may be resolving. Cont CRRT. 5 Anemia of CKD- Hgb low. Last darbe 150 ug on 4/15.  SP prbc's Hb is up to 10.  6  MBD ckd - low phos d/t CRRT, improved after IV phos 4/14 7 Positive troponin- trend- too unstable for intervention, seen by cardiology 8 VDRF - on vent support, per CCM 9 Volume - is up 3-4kg, moderate edema UE's   Pam Fuller [facility] ([phone]/13/2019, 11:21 AM      Filed Weights   09/01/17 0430 09/02/17 0406 09/03/17 0323  Weight: 65.6 kg (144 lb 10 oz) 65.4 kg (144 lb 2.9 oz) 64 kg (141 lb 1.5 oz)    Intake/Output Summary (Last 24 hours) at 09/03/2017 1037 Last data filed at 09/03/2017 1000 Gross per 24 hour  Intake 2724.26 ml  Output 2602 ml  Net 122.26 ml    Additional Objective Labs: Basic  Metabolic Panel: Recent Labs  Lab 09/02/17 0407 09/02/17 1807 09/03/17 0355  NA 133* 131* 132*  K 3.6 4.0 3.7  CL 99* 97* 99*  CO2 26 24 25   GLUCOSE 120* 86 112*  BUN 8 6 8   CREATININE 1.32* 1.54* 1.53*  CALCIUM 7.8* 7.9* 7.8*  PHOS 2.0* 3.3 2.9   Liver Function Tests: Recent Labs  Lab 09/01/17 0357  09/02/17 0407 09/02/17 1807 09/03/17 0355  AST 1,244*  --  652*  --  315*  ALT 689*  --  515*  --  337*  ALKPHOS 260*  --  336*  --  442*  BILITOT 3.2*  --  4.2*  --  4.9*  PROT 5.2*  --  5.7*  --  5.8*  ALBUMIN 1.7*  1.7*   < > 1.8*  1.7* 1.7* 1.6*  1.7*   < > = values in this interval not displayed.   No results for input(s): LIPASE, AMYLASE in the last 168 hours. CBC: Recent Labs  Lab 08/31/17 0315 09/01/17 0357 09/01/17 1816 09/02/17 0407 09/03/17 0355  WBC 25.6* 27.7* 31.0* 32.3* 26.4*  HGB 7.4* 6.0* 9.5* 10.1* 10.0*  HCT 21.8* 17.7* 28.6* 29.3* 28.4*  MCV 88.6 86.8 85.4 85.9 86.6  PLT 252 157 144* 148* 120*   Blood Culture    Component Value Date/Time   SDES ABDOMEN BILE 09/02/2017 1405   SPECREQUEST NONE 09/02/2017 1405   CULT  09/02/2017 1405    NO GROWTH < 24 HOURS Performed at [facility], [place] [address] [place], [place] [address]    REPTSTATUS PENDING 09/02/2017 1405    CBG: Recent Labs  Lab 09/02/17 1517 09/02/17 1937 09/02/17 2341 09/03/17 0313 09/03/17 0731  GLUCAP 122* 84 91 104* 93    Lab Results  Component Value Date   INR 1.53 08/28/2017   INR 1.34 04/10/2010   INR 1.13 04/03/2010   Studies/Results:  Medications: . sodium chloride 10 mL/hr at 09/03/17 0700  . sodium chloride    . dextrose 40 mL/hr at 09/03/17 0700  . fentaNYL infusion INTRAVENOUS 75 mcg/hr (09/03/17 0800)  . heparin    . norepinephrine (LEVOPHED) Adult infusion 2 mcg/min (09/03/17 0701)  . piperacillin-tazobactam Stopped (09/03/17 0845)  . dialysis replacement fluid (prismasate) 400 mL/hr at 09/03/17 0008  . dialysis replacement fluid  (prismasate) 400 mL/hr at 09/03/17 0008  . dialysate (PRISMASATE) 1,250 mL/hr at 09/03/17 0734   . aspirin  81 mg Per Tube Daily  . chlorhexidine gluconate (MEDLINE KIT)  15 mL Mouth Rinse BID  . Chlorhexidine Gluconate Cloth  6 each Topical Daily  . darbepoetin (ARANESP) injection - DIALYSIS  150 mcg Intravenous Q Mon-HD  . doxercalciferol  2 mcg Intravenous Q M,W,F-HD  . insulin aspart  0-9 Units Subcutaneous Q4H  . mouth rinse  15 mL Mouth Rinse 10 times per day  . pantoprazole sodium  40 mg Per Tube Daily  . sodium chloride flush  10-40 mL Intracatheter Q12H

## 2017-09-03 NOTE — Progress Notes (Addendum)
PULMONARY / CRITICAL CARE MEDICINE   Name: Pam Fuller MRN: [id number] DOB: 08/11/1932    ADMISSION DATE:  09/03/2017 CONSULTATION DATE:  4/11  REFERRING MD:  Dr. Maudie Mercury  CHIEF COMPLAINT:  Aspiration  BRIEF SUMMARY:   [name] is a 84 year old female with ESRD on MWF HD, HTN, Anemia, and hx of breast cancer who presented to the ED on 4/4 with RLQ abdominal pain. She was found to have acute cholecystitis with a gallstone lodged in the gallbladder neck. CBD measured up to 10 mm. She underwent laparoscopic cholecystectomy on 4/6. She initially did well but on 4/8 was noted to have worsened pain and confusion and thought to have post-op ileus. CT abd/pelvis 4/8 showed small-mod fluid in the abdominal pelvis, read as normal post op changes but could not exclude a bile leak. A hepatobiliary scan was obtained on 4/9 which did suggest a bile leak. Surgery recommended IR drain and GI consult for ERCP. She went to the Endoscopy unit on 4/11 AM for ERCP however during prep with anesthesia she had significant bilious emesis with hypotension requiring intubation and pressor support. ERCP was not attempted. She was subsequently transferred to the medical ICU.   STUDIES:  CT abd/pelv 4/4 >> acute cholecystitis, gallstone in GB neck, CBD 10 mm  CT Head: no acute changes, chronic small vessel ischemic changes  CT abd/pelvis 4/8 >> small-mod fluid in abd/pelvis  Hepatobiliary scan 4/9 >> bile leak  TTE 4/12 >> EF 65-70%, G1DD, narrow LVOT, mild TR, mild pHTN  U/S Abdomen 4/14 >> small fluid near cholecystectomy site  Hepatobiliary scan 4/14 >> Rt subhepatic fluid collection consistent w/ biloma; increased in size from prior   CULTURES: Tracheal aspirate 4/11 >> no growth Abd fluid 4/11 >> no growth 3 days Abd fluid 4/15 >>  ANTIBIOTICS: Zosyn 4/3 >> 4/7 Unasyn 4/11 Zosyn 4/12 >>  LINES/TUBES: ETT 4/11 OGT 4/11 PIV RUE 4/9 RLQ Biliary Drain 4/11 HD Cath [facility] 4/12  SIGNIFICANT  EVENTS: 4/4 - admitted w/ acute cholecystitis 4/6 - lap chole 4/9 - bile leak seen on hepatobiliary scan 4/11 - Aspirated during prep for ERCP, intubated and transferred to ICU 4/11 - Rt abd fluid drain placed by IR 4/12- Heparin gtt started for NSTEMI; held after OG bleeding and trop peak 4/13 - worsening liver enzyymes. ESRD - Anuric. CRRT started. On levophed 73mcg and fent 156mcg 4/14 - Hgb 6.0, transfused 2 units PRBCs   SUBJECTIVE/OVERNIGHT/INTERVAL HX IV access limited - unable to obtain contrast-enhanced CT scan.   VITAL SIGNS: BP (!) 121/57   Pulse 73   Temp (!) 97.3 F (36.3 C) (Axillary)   Resp 16   Ht 5\' 2"  (1.575 m)   Wt 141 lb 1.5 oz (64 kg)   SpO2 96%   BMI 25.81 kg/m   HEMODYNAMICS:    VENTILATOR SETTINGS: Vent Mode: PRVC FiO2 (%):  [30 %] 30 % Set Rate:  [27 bmp] 27 bmp Vt Set:  [300 mL] 300 mL PEEP:  [5 cmH20] 5 cmH20 Plateau Pressure:  [11 cmH20-27 cmH20] 14 cmH20  INTAKE / OUTPUT: I/O last 3 completed shifts: In: 4534.2 [I.V.:3516.2; Other:85; NG/GT:90; IV OVFIEPPIR:518] Out: 8416 [Emesis/NG output:450; Drains:75; Other:3784]  PHYSICAL EXAMINATION: General: elderly woman, intubated HEENT: ETT/OGT in place, [facility] HD cath in place Cardiac: RRR, no rubs, murmurs or gallops Pulm: rhonchi anteriorly R > L Abd: Rt abdominal fluid drain in place Ext: warm and well perfused, LUE AVF w/ palpable thrill, LUE swollen Neuro:  eyes open, tracking, following commands (lifts arms up, wiggles toes)   PULMONARY Recent Labs  Lab 08/30/17 0252 08/31/17 0252 09/01/17 0834 09/01/17 1151 09/02/17 0342 09/03/17 0315  PHART 7.369 7.275* 7.373 7.323* 7.307* 7.354  PCO2ART 53.7* 55.8* 49.3* 52.7* 50.9* 48.8*  PO2ART 89.0 126.0* 161.0* 87.0 97.0 90.3  HCO3 31.0* 26.1 28.7* 27.5 25.4 26.8  TCO2 33* 28 30 29 27   --   O2SAT 96.0 98.0 99.0 96.0 97.0 97.3    CBC Recent Labs  Lab 09/01/17 1816 09/02/17 0407 09/03/17 0355  HGB 9.5* 10.1* 10.0*  HCT 28.6*  29.3* 28.4*  WBC 31.0* 32.3* 26.4*  PLT 144* 148* 120*    COAGULATION Recent Labs  Lab 08/28/17 1258  INR 1.53    CARDIAC   Recent Labs  Lab 09/17/2017 1708 08/30/17 0057 08/30/17 0454 08/30/17 1100 08/31/17 0315  TROPONINI 1.40* 3.17* 4.47* 3.62* 2.86*   No results for input(s): PROBNP in the last 168 hours.   CHEMISTRY Recent Labs  Lab 08/30/17 0454 08/31/17 0315  09/01/17 0357 09/01/17 1816 09/02/17 0407 09/02/17 1807 09/03/17 0355  NA 140 135   < > 136 136 133* 131* 132*  K 4.1 5.0   < > 3.8 3.8 3.6 4.0 3.7  CL 100* 100*   < > 102 103 99* 97* 99*  CO2 23 21*   < > 26 25 26 24 25   GLUCOSE 75 143*   < > 99 107* 120* 86 112*  BUN 29* 40*   < > 12 8 8 6 8   CREATININE 4.50* 4.96*   < > 1.88* 1.70* 1.32* 1.54* 1.53*  CALCIUM 8.6* 8.5*   < > 8.0* 7.7* 7.8* 7.9* 7.8*  MG 2.1 2.2  --  2.1  --  2.1  --  2.0  PHOS 4.8* 5.4*   < > 1.8* 2.4* 2.0* 3.3 2.9   < > = values in this interval not displayed.   Estimated Creatinine Clearance: 24.9 mL/min (A) (by C-G formula based on SCr of 1.53 mg/dL (H)).  I/O last 3 completed shifts: In: 4534.2 [I.V.:3516.2; Other:85; NG/GT:90; IV MPNTIRWER:154] Out: 0086 [Emesis/NG output:450; Drains:75; Other:3784]   Intake/Output Summary (Last 24 hours) at 09/03/2017 0732 Last data filed at 09/03/2017 0700 Gross per 24 hour  Intake 2926.95 ml  Output 2756 ml  Net 170.95 ml     LIVER Recent Labs  Lab 08/28/17 1258  08/30/17 1100 08/31/17 0315  09/01/17 0357 09/01/17 1816 09/02/17 0407 09/02/17 1807 09/03/17 0355  AST  --    < > 607* 2,629*  --  1,244*  --  652*  --  315*  ALT  --    < > 221* 912*  --  689*  --  515*  --  337*  ALKPHOS  --    < > 201* 253*  --  260*  --  336*  --  442*  BILITOT  --    < > 3.6* 3.5*  --  3.2*  --  4.2*  --  4.9*  PROT  --    < > 5.4* 5.6*  --  5.2*  --  5.7*  --  5.8*  ALBUMIN  --    < > 1.9* 1.9*  1.9*   < > 1.7*  1.7* 1.6* 1.8*  1.7* 1.7* 1.6*  1.7*  INR 1.53  --   --   --   --   --    --   --   --   --    < > =  values in this interval not displayed.     INFECTIOUS Recent Labs  Lab 08/30/17 1501 08/31/17 0906 09/01/17 0018  LATICACIDVEN 4.7* 1.1 1.8     ENDOCRINE CBG (last 3)  Recent Labs    09/02/17 1517 09/02/17 1937 09/02/17 2341  GLUCAP 122* 84 91         IMAGING x48h  - image(s) personally visualized  -   highlighted in bold Nm Hepatobiliary Liver Func  Result Date: 09/01/2017 CLINICAL DATA:  Prior cholecystectomy. Postop bile leak. Previous percutaneous drainage catheter placement. Evaluate for persistent bile leak. EXAM: NUCLEAR MEDICINE HEPATOBILIARY IMAGING TECHNIQUE: Sequential images of the abdomen were obtained out to 60 minutes following intravenous administration of radiopharmaceutical. RADIOPHARMACEUTICALS:  5.5 mCi Tc-60m  Choletec IV COMPARISON:  Prior hepatobiliary imaging on 08/27/2017 FINDINGS: Prompt uptake and excretion of activity by the liver is seen. Gallbladder activity is absent, consistent with prior cholecystectomy. Biliary activity initially accumulates and persists in a rounded collection in the right subhepatic space, which appears increased in size compared to previous study. There is no evidence of free intraperitoneal leakage of biliary activity. Subsequent images show passage of some biliary contrast into the small bowel. IMPRESSION: Excreted biliary activity accumulates in a right subhepatic fluid collection, consistent with biloma. This appears increased in size compared to previous study. Consider abdomen pelvis CT for further evaluation. Electronically Signed   By: Earle Gell M.D.   On: 09/01/2017 15:57   US Abdomen Complete  Result Date: 09/01/2017 CLINICAL DATA:  Bile leak, postoperative. History of cholecystectomy. History of hypertension. EXAM: ABDOMEN ULTRASOUND COMPLETE COMPARISON:  None. FINDINGS: Gallbladder: Surgically absent. Common bile duct: Diameter: 5 mm Liver: No focal lesion identified. Within normal  limits in parenchymal echogenicity. Small amount of free fluid adjacent to the liver, approximating the gallbladder fossa. Portal vein is patent on color Doppler imaging with normal direction of blood flow towards the liver. IVC: No abnormality visualized. Pancreas: Visualized portion unremarkable. Spleen: Size and appearance within normal limits. Right Kidney: Length: 6 cm. Poorly seen. Echogenic suggesting chronic medical renal disease. Hypoechoic masses within the RIGHT kidney measuring 1.9 cm and 1 cm respectively, likely cysts. Left Kidney: Not seen.  Appeared similarly atrophic on recent CT. Abdominal aorta: No aneurysm visualized. Other findings: Ascites within the LEFT upper quadrant. IMPRESSION: 1. Status post cholecystectomy. Small amount of fluid adjacent to the liver near the cholecystectomy site, and expected amount status post recent cholecystectomy, although nuclear medicine study of 08/27/2017 described a probable bile leak. 2. Atrophic kidneys. Probable RIGHT renal cysts, too small to definitively characterize. Cysts were described on previous CT report. 3. LEFT upper quadrant ascites. Electronically Signed   By: Franki Cabot M.D.   On: 09/01/2017 12:00   Dg Chest Port 1 View  Result Date: 09/03/2017 CLINICAL DATA:  84 year old female intubated.  Subsequent encounter. EXAM: PORTABLE CHEST 1 VIEW COMPARISON:  09/02/2017 chest x-ray. FINDINGS: Endotracheal tube tip 2.6 cm above the carina. Left central line tip proximal to mid superior vena cava level. Nasogastric tube in place with side hole gastric body level. Tip not included on present exam. Diffuse slightly asymmetric airspace disease without significant change from prior exam. No gross pneumothorax. Heart size top-normal. Calcified aorta. No acute osseous abnormality. Pigtail catheter right upper quadrant. IMPRESSION: Diffuse asymmetric airspace disease without significant change from prior exam. Aortic Atherosclerosis (ICD10-I70.0).  Electronically Signed   By: Genia Del M.D.   On: 09/03/2017 06:47   Dg Chest Port 1 View  Result Date:  09/02/2017 CLINICAL DATA:  Followup ventilator support. EXAM: PORTABLE CHEST 1 VIEW COMPARISON:  09/01/2017 FINDINGS: Endotracheal tube tip is 1.5 cm above the carina. Nasogastric tube enters the abdomen. Left internal jugular central line tip is unchanged, in the SVC above the right atrium. Bilateral airspace filling persists, right more than left, probably representing pneumonia. This shows mild generalized improvement. IMPRESSION: Lines and tubes well positioned. Persistent bilateral airspace filling, showing mild generalized improvement. Electronically Signed   By: Nelson Chimes M.D.   On: 09/02/2017 07:10     DISCUSSION: [name] is a 84 year old female with ESRD on MWF HD, HTN, Anemia, and hx of breast cancer who was admitted 4/4 with acute cholecystitis. S/p lap chole 4/6 complicated by post-op bile leak. Went for ERCP 4/11 however developed bilious emesis during prep, hypotension, and required intubation plus pressor support. Transferred to the ICU on 4/11.  ASSESSMENT / PLAN: PULMONARY A: Acute Respiratory Distress 2/2 Aspiration Intubated 4/11 P:   Continue vent support - 6 cc/kg IBW for ARDS Wake up assessments and SBT w/o extubation Monitor CXR/ABG  CARDIOVASCULAR A:  #baseline Aortic Atherosclerosis on CXR RBBB - present prior to admission #current Septic Shock/Hypotension NSTEMI 2/2 demand ischemia in setting of hypotensive event Echocardiogram w/ EF 65-70% Prolonged QTc LUE Edema P:  Continue levophed for MAP >65, titrate off as able Check LUE U/S  RENAL A:   ESRD on MWF HD AVF LUE P:   CRRT per renal  GASTROINTESTINAL A:   Acute Cholecystitis s/p lap chole 4/6 Bile leak s/p IR Rt abdominal fluid drain 4/11 - biloma by HIDA 4/14 Shock Liver P:   Rt abdominal fluid drain in place Surgery and GI following - ERCP if able this week CT  abd/pelvis pending to assess for potential biliary drain placement by IR  HEMATOLOGIC A:   Hx of Breast Cancer - no acute issue Anemia of Chronic Disease -  S/p 2 units PRBCs 4/14 Leukocytosis Thrombocytopenia P:  Monitor Hgb, transfuse for Hgb <7.0 Monitor for bleeding subq heparin on hold for anemia, platelets trending down  INFECTIOUS A:   Septic Shock from Aspiration PNA Bile Leak s/p IR Biliary Drain 4/11 P:   Zosyn - add Vancomycin if further decompensation Check blood cultures F/u body fluid cultures  ENDOCRINE A:   Secondary Hyperparathyroidism   P:   Intermittent CBG monitoring  NEUROLOGIC A:   Pain/Agitation P:   RASS goal: 0 to -2 Fentanyl gtt Precedex Versed prn   FAMILY  - Updates: Son updated at bedside 4/16 - he is agreeable to hold CRRT to obtain CT imaging.  - Inter-disciplinary family meet or Palliative Care meeting due by:  09/05/17   Zada Finders, MD Internal Medicine PGY-3  09/03/2017 7:32 AM

## 2017-09-03 NOTE — Progress Notes (Signed)
5 Days Post-Op   Subjective/Chief Complaint: On vent off pressors on HD WBC down    Objective: Vital signs in last 24 hours: Temp:  [97.3 F (36.3 C)-98.8 F (37.1 C)] 97.5 F (36.4 C) (04/16 0800) Pulse Rate:  [69-88] 74 (04/16 0830) Resp:  [14-32] 21 (04/16 0830) BP: (81-173)/(43-68) 119/57 (04/16 0830) SpO2:  [81 %-100 %] 100 % (04/16 0830) FiO2 (%):  [30 %] 30 % (04/16 0800) Weight:  [64 kg (141 lb 1.5 oz)] 64 kg (141 lb 1.5 oz) (04/16 0323) Last BM Date: 09/03/17  Intake/Output from previous day: 04/15 0701 - 04/16 0700 In: 2927 [I.V.:2107; NG/GT:60; IV Piggyback:675] Out: 2756 [Emesis/NG output:300; Drains:75] Intake/Output this shift: Total I/O In: 112.5 [I.V.:62.5; IV Piggyback:50] Out: 110 [Drains:50; Other:60]  Incision/Wound:CDI soft drain bilious   Lab Results:  Recent Labs    09/02/17 0407 09/03/17 0355  WBC 32.3* 26.4*  HGB 10.1* 10.0*  HCT 29.3* 28.4*  PLT 148* 120*   BMET Recent Labs    09/02/17 1807 09/03/17 0355  NA 131* 132*  K 4.0 3.7  CL 97* 99*  CO2 24 25  GLUCOSE 86 112*  BUN 6 8  CREATININE 1.54* 1.53*  CALCIUM 7.9* 7.8*   PT/INR No results for input(s): LABPROT, INR in the last 72 hours. ABG Recent Labs    09/02/17 0342 09/03/17 0315  PHART 7.307* 7.354  HCO3 25.4 26.8    Studies/Results: Nm Hepatobiliary Liver Func  Result Date: 09/01/2017 CLINICAL DATA:  Prior cholecystectomy. Postop bile leak. Previous percutaneous drainage catheter placement. Evaluate for persistent bile leak. EXAM: NUCLEAR MEDICINE HEPATOBILIARY IMAGING TECHNIQUE: Sequential images of the abdomen were obtained out to 60 minutes following intravenous administration of radiopharmaceutical. RADIOPHARMACEUTICALS:  5.5 mCi Tc-82m Choletec IV COMPARISON:  Prior hepatobiliary imaging on 08/27/2017 FINDINGS: Prompt uptake and excretion of activity by the liver is seen. Gallbladder activity is absent, consistent with prior cholecystectomy. Biliary activity  initially accumulates and persists in a rounded collection in the right subhepatic space, which appears increased in size compared to previous study. There is no evidence of free intraperitoneal leakage of biliary activity. Subsequent images show passage of some biliary contrast into the small bowel. IMPRESSION: Excreted biliary activity accumulates in a right subhepatic fluid collection, consistent with biloma. This appears increased in size compared to previous study. Consider abdomen pelvis CT for further evaluation. Electronically Signed   By: JEarle GellM.D.   On: 09/01/2017 15:57   UKoreaAbdomen Complete  Result Date: 09/01/2017 CLINICAL DATA:  Bile leak, postoperative. History of cholecystectomy. History of hypertension. EXAM: ABDOMEN ULTRASOUND COMPLETE COMPARISON:  None. FINDINGS: Gallbladder: Surgically absent. Common bile duct: Diameter: 5 mm Liver: No focal lesion identified. Within normal limits in parenchymal echogenicity. Small amount of free fluid adjacent to the liver, approximating the gallbladder fossa. Portal vein is patent on color Doppler imaging with normal direction of blood flow towards the liver. IVC: No abnormality visualized. Pancreas: Visualized portion unremarkable. Spleen: Size and appearance within normal limits. Right Kidney: Length: 6 cm. Poorly seen. Echogenic suggesting chronic medical renal disease. Hypoechoic masses within the RIGHT kidney measuring 1.9 cm and 1 cm respectively, likely cysts. Left Kidney: Not seen.  Appeared similarly atrophic on recent CT. Abdominal aorta: No aneurysm visualized. Other findings: Ascites within the LEFT upper quadrant. IMPRESSION: 1. Status post cholecystectomy. Small amount of fluid adjacent to the liver near the cholecystectomy site, and expected amount status post recent cholecystectomy, although nuclear medicine study of 08/27/2017 described a probable  bile leak. 2. Atrophic kidneys. Probable RIGHT renal cysts, too small to definitively  characterize. Cysts were described on previous CT report. 3. LEFT upper quadrant ascites. Electronically Signed   By: Franki Cabot M.D.   On: 09/01/2017 12:00   Dg Chest Port 1 View  Result Date: 09/03/2017 CLINICAL DATA:  84 year old female intubated.  Subsequent encounter. EXAM: PORTABLE CHEST 1 VIEW COMPARISON:  09/02/2017 chest x-ray. FINDINGS: Endotracheal tube tip 2.6 cm above the carina. Left central line tip proximal to mid superior vena cava level. Nasogastric tube in place with side hole gastric body level. Tip not included on present exam. Diffuse slightly asymmetric airspace disease without significant change from prior exam. No gross pneumothorax. Heart size top-normal. Calcified aorta. No acute osseous abnormality. Pigtail catheter right upper quadrant. IMPRESSION: Diffuse asymmetric airspace disease without significant change from prior exam. Aortic Atherosclerosis (ICD10-I70.0). Electronically Signed   By: Genia Del M.D.   On: 09/03/2017 06:47   Dg Chest Port 1 View  Result Date: 09/02/2017 CLINICAL DATA:  Followup ventilator support. EXAM: PORTABLE CHEST 1 VIEW COMPARISON:  09/01/2017 FINDINGS: Endotracheal tube tip is 1.5 cm above the carina. Nasogastric tube enters the abdomen. Left internal jugular central line tip is unchanged, in the SVC above the right atrium. Bilateral airspace filling persists, right more than left, probably representing pneumonia. This shows mild generalized improvement. IMPRESSION: Lines and tubes well positioned. Persistent bilateral airspace filling, showing mild generalized improvement. Electronically Signed   By: Nelson Chimes M.D.   On: 09/02/2017 07:10    Anti-infectives: Anti-infectives (From admission, onward)   Start     Dose/Rate Route Frequency Ordered Stop   08/31/17 0800  piperacillin-tazobactam (ZOSYN) IVPB 3.375 g     3.375 g 100 mL/hr over 30 Minutes Intravenous Every 6 hours 08/31/17 0739     08/30/17 2000  Ampicillin-Sulbactam  (UNASYN) 3 g in sodium chloride 0.9 % 100 mL IVPB  Status:  Discontinued     3 g 200 mL/hr over 30 Minutes Intravenous Every 24 hours 09/13/2017 0953 08/30/17 0945   08/30/17 1230  piperacillin-tazobactam (ZOSYN) IVPB 3.375 g  Status:  Discontinued     3.375 g 12.5 mL/hr over 240 Minutes Intravenous Every 12 hours 08/30/17 1134 08/31/17 0738   08/26/2017 1030  Ampicillin-Sulbactam (UNASYN) 3 g in sodium chloride 0.9 % 100 mL IVPB     3 g 200 mL/hr over 30 Minutes Intravenous  Once 08/31/2017 0953 09/16/2017 1246   08/30/2017 0000  ampicillin-sulbactam (UNASYN) 1.5 g in sodium chloride 0.9 % 100 mL IVPB  Status:  Discontinued     1.5 g 200 mL/hr over 30 Minutes Intravenous Once 08/28/17 1045 09/09/2017 0928   09/11/2017 2200  piperacillin-tazobactam (ZOSYN) IVPB 3.375 g    Note to Pharmacy:  Zosyn 3.375 g IV q12h in ESRD on HD   3.375 g 12.5 mL/hr over 240 Minutes Intravenous Every 12 hours 08/23/2017 1155 08/25/17 1335   08/22/17 1000  piperacillin-tazobactam (ZOSYN) IVPB 3.375 g  Status:  Discontinued    Note to Pharmacy:  Zosyn 3.375 g IV q12h in ESRD on HD   3.375 g 12.5 mL/hr over 240 Minutes Intravenous Every 12 hours 08/22/17 0128 09/15/2017 1155   08/22/17 0045  piperacillin-tazobactam (ZOSYN) IVPB 3.375 g     3.375 g 12.5 mL/hr over 240 Minutes Intravenous  Once 08/22/17 0038 08/22/17 0145      Assessment/Plan: HTN ESRD - HD MWF H/o breast cancer  Acute cholecystitis POD12s/p lap chole4/6/19 by Dr. Redmond Pulling -  CT scan 4/8 w/ small amount fluid around liver and layering in pelvis ; HIDA 4/9 positive for bile leak  -drain placed for biloma.  -still no ERCP for stent placement yet. Now with likely NSTEMI. Defer timing of ERCP to GI. Will scan to see if PTC an option -cont OG while intubated. May be able to start TFs at some point in the future  VDRF secondary to aspiration/PNA -CCM following - appreciate assistance. Started on Unasyn  -per CCM  ID -zosyn 4/4>>4/7,Unasyn  4/11--> FEN -NPO/OGT VTE -SCDs, heparingtt Foley -per CCM Follow up -Dr. Redmond Pulling  Plan-  Off pressors  Needs CT when able to see if PTC an option  Doubt she will tolerate ERCP anytime soon Drainage is less but needs stent    LOS: 12 days    Marcello Moores A Jarae Panas 09/03/2017

## 2017-09-03 NOTE — Progress Notes (Signed)
Progress Note   Subjective  Patient intubated, now off pressors. WBC slightly improved, LFTs improving    Objective   Vital signs in last 24 hours: Temp:  [97.3 F (36.3 C)-98.6 F (37 C)] 98.6 F (37 C) (04/16 1144) Pulse Rate:  [69-88] 75 (04/16 1330) Resp:  [14-32] 22 (04/16 1330) BP: (81-173)/(43-68) 113/54 (04/16 1330) SpO2:  [81 %-100 %] 100 % (04/16 1330) FiO2 (%):  [30 %] 30 % (04/16 1200) Weight:  [141 lb 1.5 oz (64 kg)] 141 lb 1.5 oz (64 kg) (04/16 0323) Last BM Date: 09/03/17 General:    AA female, sedated, intubated Heart:  Regular rate and rhythm; 2/6 SEM Lungs: relatively CTA B Abdomen:  Soft, biliary drain in place Extremities:  Upper and lower edema Neurologic:  sedated  Intake/Output from previous day: 04/15 0701 - 04/16 0700 In: 2927 [I.V.:2107; NG/GT:60; IV Piggyback:675] Out: 2756 [Emesis/NG output:300; Drains:75] Intake/Output this shift: Total I/O In: 810 [P.O.:300; I.V.:400; NG/GT:60; IV Piggyback:50] Out: 639 [Drains:175; Other:464]  Lab Results: Recent Labs    09/01/17 1816 09/02/17 0407 09/03/17 0355  WBC 31.0* 32.3* 26.4*  HGB 9.5* 10.1* 10.0*  HCT 28.6* 29.3* 28.4*  PLT 144* 148* 120*   BMET Recent Labs    09/02/17 0407 09/02/17 1807 09/03/17 0355  NA 133* 131* 132*  K 3.6 4.0 3.7  CL 99* 97* 99*  CO2 26 24 25   GLUCOSE 120* 86 112*  BUN 8 6 8   CREATININE 1.32* 1.54* 1.53*  CALCIUM 7.8* 7.9* 7.8*   LFT Recent Labs    09/03/17 0355  PROT 5.8*  ALBUMIN 1.6*  1.7*  AST 315*  ALT 337*  ALKPHOS 442*  BILITOT 4.9*  BILIDIR 3.3*  IBILI 1.6*   PT/INR No results for input(s): LABPROT, INR in the last 72 hours.  Studies/Results: Nm Hepatobiliary Liver Func  Result Date: 09/01/2017 CLINICAL DATA:  Prior cholecystectomy. Postop bile leak. Previous percutaneous drainage catheter placement. Evaluate for persistent bile leak. EXAM: NUCLEAR MEDICINE HEPATOBILIARY IMAGING TECHNIQUE: Sequential images of the abdomen  were obtained out to 60 minutes following intravenous administration of radiopharmaceutical. RADIOPHARMACEUTICALS:  5.5 mCi Tc-43m  Choletec IV COMPARISON:  Prior hepatobiliary imaging on 08/27/2017 FINDINGS: Prompt uptake and excretion of activity by the liver is seen. Gallbladder activity is absent, consistent with prior cholecystectomy. Biliary activity initially accumulates and persists in a rounded collection in the right subhepatic space, which appears increased in size compared to previous study. There is no evidence of free intraperitoneal leakage of biliary activity. Subsequent images show passage of some biliary contrast into the small bowel. IMPRESSION: Excreted biliary activity accumulates in a right subhepatic fluid collection, consistent with biloma. This appears increased in size compared to previous study. Consider abdomen pelvis CT for further evaluation. Electronically Signed   By: Earle Gell M.D.   On: 09/01/2017 15:57   Dg Chest Port 1 View  Result Date: 09/03/2017 CLINICAL DATA:  84 year old female intubated.  Subsequent encounter. EXAM: PORTABLE CHEST 1 VIEW COMPARISON:  09/02/2017 chest x-ray. FINDINGS: Endotracheal tube tip 2.6 cm above the carina. Left central line tip proximal to mid superior vena cava level. Nasogastric tube in place with side hole gastric body level. Tip not included on present exam. Diffuse slightly asymmetric airspace disease without significant change from prior exam. No gross pneumothorax. Heart size top-normal. Calcified aorta. No acute osseous abnormality. Pigtail catheter right upper quadrant. IMPRESSION: Diffuse asymmetric airspace disease without significant change from prior exam. Aortic Atherosclerosis (ICD10-I70.0). Electronically  Signed   By: Genia Del M.D.   On: 09/03/2017 06:47   Dg Chest Port 1 View  Result Date: 09/02/2017 CLINICAL DATA:  Followup ventilator support. EXAM: PORTABLE CHEST 1 VIEW COMPARISON:  09/01/2017 FINDINGS: Endotracheal  tube tip is 1.5 cm above the carina. Nasogastric tube enters the abdomen. Left internal jugular central line tip is unchanged, in the SVC above the right atrium. Bilateral airspace filling persists, right more than left, probably representing pneumonia. This shows mild generalized improvement. IMPRESSION: Lines and tubes well positioned. Persistent bilateral airspace filling, showing mild generalized improvement. Electronically Signed   By: Nelson Chimes M.D.   On: 09/02/2017 07:10       Assessment / Plan:    84 y/o female with ESRD s/p lap chole on 4/6 with subsequent bile leak. Developed respiratory failure prior to ERCP attempt, intubated on pressors, course complicated by NSTEMI due to demand ischemia and shock liver.  EF okay on echo, ALT improving, while bili rising / lagging behind ALT improvement which can be expected post shock liver, or from bile leak. Now off pressores. Bile leak persistent in right subhepatic space on HIDA 2 days ago but no evidence of intraperitoneal leakage. CT scan today for reassessment and to see if any other location to drain.  She is critically ill, but improved today now off pressors and WBC slightly better. She does warrant an ERCP to more definitively treat bile leak but is not yet stable enough to have this done. I spoke with family today, answered their questions. We will continue to follow, continue supportive care otherwise.  [place] Cellar, MD [facility]

## 2017-09-04 ENCOUNTER — Inpatient Hospital Stay (HOSPITAL_COMMUNITY): Payer: Medicare Other

## 2017-09-04 DIAGNOSIS — R609 Edema, unspecified: Secondary | ICD-10-CM

## 2017-09-04 LAB — HEPATIC FUNCTION PANEL
ALK PHOS: 510 U/L — AB (ref 38–126)
ALT: 234 U/L — AB (ref 14–54)
AST: 157 U/L — ABNORMAL HIGH (ref 15–41)
Albumin: 1.7 g/dL — ABNORMAL LOW (ref 3.5–5.0)
BILIRUBIN DIRECT: 2.8 mg/dL — AB (ref 0.1–0.5)
Indirect Bilirubin: 1.8 mg/dL — ABNORMAL HIGH (ref 0.3–0.9)
Total Bilirubin: 4.6 mg/dL — ABNORMAL HIGH (ref 0.3–1.2)
Total Protein: 5.9 g/dL — ABNORMAL LOW (ref 6.5–8.1)

## 2017-09-04 LAB — APTT
APTT: 112 s — AB (ref 24–36)
aPTT: 64 seconds — ABNORMAL HIGH (ref 24–36)
aPTT: 97 seconds — ABNORMAL HIGH (ref 24–36)
aPTT: 107 seconds — ABNORMAL HIGH (ref 24–36)

## 2017-09-04 LAB — BASIC METABOLIC PANEL
ANION GAP: 6 (ref 5–15)
BUN: 6 mg/dL (ref 6–20)
CHLORIDE: 100 mmol/L — AB (ref 101–111)
CO2: 26 mmol/L (ref 22–32)
Calcium: 8 mg/dL — ABNORMAL LOW (ref 8.9–10.3)
Creatinine, Ser: 1.29 mg/dL — ABNORMAL HIGH (ref 0.44–1.00)
GFR calc Af Amer: 43 mL/min — ABNORMAL LOW (ref >60)
GFR calc non Af Amer: 37 mL/min — ABNORMAL LOW (ref >60)
GLUCOSE: 102 mg/dL — AB (ref 65–99)
Potassium: 4.1 mmol/L (ref 3.5–5.1)
Sodium: 132 mmol/L — ABNORMAL LOW (ref 135–145)

## 2017-09-04 LAB — GLUCOSE, CAPILLARY
GLUCOSE-CAPILLARY: 81 mg/dL (ref 65–99)
GLUCOSE-CAPILLARY: 93 mg/dL (ref 65–99)
GLUCOSE-CAPILLARY: 80 mg/dL (ref 65–99)
Glucose-Capillary: 96 mg/dL (ref 65–99)
Glucose-Capillary: 95 mg/dL (ref 65–99)
Glucose-Capillary: 74 mg/dL (ref 65–99)

## 2017-09-04 LAB — CBC
HEMATOCRIT: 29.2 % — AB (ref 36.0–46.0)
HEMOGLOBIN: 9.8 g/dL — AB (ref 12.0–15.0)
MCH: 29 pg (ref 26.0–34.0)
MCHC: 33.6 g/dL (ref 30.0–36.0)
MCV: 86.4 fL (ref 78.0–100.0)
Platelets: 95 10*3/uL — ABNORMAL LOW (ref 150–400)
RBC: 3.38 MIL/uL — ABNORMAL LOW (ref 3.87–5.11)
RDW: 16.7 % — ABNORMAL HIGH (ref 11.5–15.5)
WBC: 25.2 10*3/uL — ABNORMAL HIGH (ref 4.0–10.5)

## 2017-09-04 LAB — RENAL FUNCTION PANEL
ALBUMIN: 1.5 g/dL — AB (ref 3.5–5.0)
ANION GAP: 8 (ref 5–15)
CO2: 25 mmol/L (ref 22–32)
Calcium: 8.2 mg/dL — ABNORMAL LOW (ref 8.9–10.3)
Chloride: 101 mmol/L (ref 101–111)
Creatinine, Ser: 1.32 mg/dL — ABNORMAL HIGH (ref 0.44–1.00)
GFR calc Af Amer: 42 mL/min — ABNORMAL LOW (ref >60)
GFR, EST NON AFRICAN AMERICAN: 36 mL/min — AB (ref >60)
Glucose, Bld: 108 mg/dL — ABNORMAL HIGH (ref 65–99)
PHOSPHORUS: 2.8 mg/dL (ref 2.5–4.6)
POTASSIUM: 4.1 mmol/L (ref 3.5–5.1)
Sodium: 134 mmol/L — ABNORMAL LOW (ref 135–145)

## 2017-09-04 LAB — PROTIME-INR
INR: 2.98
PROTHROMBIN TIME: 30.7 s — AB (ref 11.4–15.2)

## 2017-09-04 LAB — MAGNESIUM: Magnesium: 2.2 mg/dL (ref 1.7–2.4)

## 2017-09-04 LAB — PHOSPHORUS: Phosphorus: 2.3 mg/dL — ABNORMAL LOW (ref 2.5–4.6)

## 2017-09-04 MED ORDER — SODIUM CHLORIDE 0.9 % IV SOLN
0.0400 mg/kg/h | INTRAVENOUS | Status: DC
  Administered 2017-09-04: 0.05 mg/kg/h via INTRAVENOUS
  Filled 2017-09-04: qty 250

## 2017-09-04 MED ORDER — BIVALIRUDIN TRIFLUOROACETATE 250 MG IV SOLR
0.0120 mg/kg/h | INTRAVENOUS | Status: DC
  Filled 2017-09-04: qty 250

## 2017-09-04 MED ORDER — SODIUM CHLORIDE 0.9 % IR SOLN
Freq: Two times a day (BID) | Status: DC
  Administered 2017-09-04 – 2017-09-05 (×3)
  Filled 2017-09-04 (×5): qty 500

## 2017-09-04 NOTE — Progress Notes (Addendum)
Patient ID: Pam Fuller, female   DOB: 1932-11-10, 84 y.o.   MRN: [id number]    6 Days Post-Op  Subjective: Pt intubated, off pressors.  On fentanyl drip still.  Son, Milbert Coulter, present in room today as well as other son on speaker phone.  Objective: Vital signs in last 24 hours: Temp:  [97.3 F (36.3 C)-98.6 F (37 C)] 97.4 F (36.3 C) (04/17 0723) Pulse Rate:  [71-86] 75 (04/17 0730) Resp:  [13-28] 14 (04/17 0730) BP: (95-138)/(47-68) 135/55 (04/17 0730) SpO2:  [97 %-100 %] 98 % (04/17 0730) FiO2 (%):  [30 %] 30 % (04/17 0700) Weight:  [65.8 kg (145 lb 1 oz)] 65.8 kg (145 lb 1 oz) (04/17 0444) Last BM Date: 09/03/17  Intake/Output from previous day: 04/16 0701 - 04/17 0700 In: 2315 [P.O.:600; I.V.:1505; NG/GT:60; IV Piggyback:150] Out: 4097 [Emesis/NG output:150; Drains:225] Intake/Output this shift: Total I/O In: 67 [IV Piggyback:50] Out: -   PE: Abd: soft, ND, drain in place with bile and small amount of blood present.  Lab Results:  Recent Labs    09/03/17 0355 09/04/17 0445  WBC 26.4* 25.2*  HGB 10.0* 9.8*  HCT 28.4* 29.2*  PLT 120* 95*   BMET Recent Labs    09/03/17 1449 09/04/17 0445  NA 132* 132*  K 4.0 4.1  CL 98* 100*  CO2 25 26  GLUCOSE 109* 102*  BUN 8 6  CREATININE 1.35* 1.29*  CALCIUM 8.0* 8.0*   PT/INR No results for input(s): LABPROT, INR in the last 72 hours. CMP     Component Value Date/Time   NA 132 (L) 09/04/2017 0445   NA 142 02/12/2017 1400   K 4.1 09/04/2017 0445   K 3.9 02/12/2017 1400   CL 100 (L) 09/04/2017 0445   CO2 26 09/04/2017 0445   CO2 30 (H) 02/12/2017 1400   GLUCOSE 102 (H) 09/04/2017 0445   GLUCOSE 88 02/12/2017 1400   BUN 6 09/04/2017 0445   BUN 16.9 02/12/2017 1400   CREATININE 1.29 (H) 09/04/2017 0445   CREATININE 6.2 (HH) 02/12/2017 1400   CALCIUM 8.0 (L) 09/04/2017 0445   CALCIUM 9.2 02/12/2017 1400   PROT 5.9 (L) 09/04/2017 0445   PROT 6.8 02/12/2017 1400   ALBUMIN 1.7 (L) 09/04/2017 0445   ALBUMIN 3.3 (L) 02/12/2017 1400   AST 157 (H) 09/04/2017 0445   AST 13 02/12/2017 1400   ALT 234 (H) 09/04/2017 0445   ALT 7 02/12/2017 1400   ALKPHOS 510 (H) 09/04/2017 0445   ALKPHOS 75 02/12/2017 1400   BILITOT 4.6 (H) 09/04/2017 0445   BILITOT 0.68 02/12/2017 1400   GFRNONAA 37 (L) 09/04/2017 0445   GFRAA 43 (L) 09/04/2017 0445   Lipase     Component Value Date/Time   LIPASE 36 09/02/2017 1618       Studies/Results: Ct Abdomen Pelvis Wo Contrast  Result Date: 09/03/2017 CLINICAL DATA:  84 year old female with a history of biloma. EXAM: CT ABDOMEN AND PELVIS WITHOUT CONTRAST TECHNIQUE: Multidetector CT imaging of the abdomen and pelvis was performed following the standard protocol without IV contrast. COMPARISON:  Chest x-ray 09/03/2017, nuclear medicine study 09/01/2017, prior CT 08/26/2017, 08/23/2017 FINDINGS: Lower chest: Nodular airspace opacity of right middle lobe, bilateral lower lobes, similar distribution to prior CT and plain film studies. No pleural effusion. Native coronary calcifications. Hepatobiliary: Liver similar to prior studies. There is trace fluid remaining on the dome of the liver, though significantly decreased in volume from the CT dated 08/26/2017. Pigtail drainage catheter  terminates adjacent to segment 6 of the liver. Residual thin fluid within the gallbladder fossa with surgical clips from prior cholecystectomy. Fluid measures 7.2 cm transversely and approximately 17 mm cranial caudal. No intrahepatic or extrahepatic biliary ductal dilatation. Pancreas: Unremarkable pancreas. There is rounded tissue inseparable from the distal pancreatic tail, adjacent to the spleen. The tissue is similar density of spleen tissue. Overall, this focus of tissue is unchanged dating to 2011, and is most likely accessory splenic tissue/splenule. Spleen: Unremarkable spleen Adrenals/Urinary Tract: Unremarkable adrenal glands. Bilateral kidneys atrophic. Low-density lesions,  unchanged over the course of several CT studies dating to 2011. None of these lesions are completely characterized on the current CT. Stomach/Bowel: Enteric contrast within stomach, small bowel, extending to the colon. No extraluminal contrast identified. No transition point. No dilated small bowel or colon. Questionable wall thickening of the length of the colon, poorly characterized with the exclusion of IV contrast. Colonic diverticular change without definite associated inflammatory changes. Gastric tube terminates within the stomach. Vascular/Lymphatic: Calcifications of the abdominal aorta and bilateral iliac arteries. Periaortic lymph nodes, none of which are enlarged. No pelvic adenopathy. No mesenteric adenopathy. Reproductive: Hysterectomy Other: Dependent low-density fluid within the pelvis without evidence of loculation. Volume is unchanged from the comparison CT. Interval resolution of peritoneal air. Stranding of the mesenteric fat associated with small bowel and colon. Stranding of the abdominal wall fat/edema. Musculoskeletal: No acute displaced fracture. Multilevel degenerative changes of the visualized thoracolumbar spine. Degenerative changes of the hips. IMPRESSION: Pigtail drainage catheter at the inferior liver margin, with overall decreased volume of perihepatic fluid in this patient with a history of biloma. There is trace retained fluid in the gallbladder fossa, status post cholecystectomy. Similar volume of left subdiaphragmatic low-density fluid, as well as low-density fluid layered within the dependent pelvis. Neither of these collections appear loculated, potentially representing reactive ascites or additional sites of bile accumulation, less likely abscess. Questionable colonic wall thickening throughout the length of the colon. This may be seen with anasarca/edema and a positive fluid status, or alternatively could represent inflammatory/infectious colitis. No evidence of bowel  obstruction. Multifocal airspace disease of the lung bases, concerning for infection. Mesenteric and body wall anasarca/edema. Aortic Atherosclerosis (ICD10-I70.0). Electronically Signed   By: Corrie Mckusick D.O.   On: 09/03/2017 17:36   Dg Chest Port 1 View  Result Date: 09/03/2017 CLINICAL DATA:  84 year old female intubated.  Subsequent encounter. EXAM: PORTABLE CHEST 1 VIEW COMPARISON:  09/02/2017 chest x-ray. FINDINGS: Endotracheal tube tip 2.6 cm above the carina. Left central line tip proximal to mid superior vena cava level. Nasogastric tube in place with side hole gastric body level. Tip not included on present exam. Diffuse slightly asymmetric airspace disease without significant change from prior exam. No gross pneumothorax. Heart size top-normal. Calcified aorta. No acute osseous abnormality. Pigtail catheter right upper quadrant. IMPRESSION: Diffuse asymmetric airspace disease without significant change from prior exam. Aortic Atherosclerosis (ICD10-I70.0). Electronically Signed   By: Genia Del M.D.   On: 09/03/2017 06:47    Anti-infectives: Anti-infectives (From admission, onward)   Start     Dose/Rate Route Frequency Ordered Stop   08/31/17 0800  piperacillin-tazobactam (ZOSYN) IVPB 3.375 g     3.375 g 100 mL/hr over 30 Minutes Intravenous Every 6 hours 08/31/17 0739     08/30/17 2000  Ampicillin-Sulbactam (UNASYN) 3 g in sodium chloride 0.9 % 100 mL IVPB  Status:  Discontinued     3 g 200 mL/hr over 30 Minutes Intravenous Every 24 hours  08/26/2017 0953 08/30/17 0945   08/30/17 1230  piperacillin-tazobactam (ZOSYN) IVPB 3.375 g  Status:  Discontinued     3.375 g 12.5 mL/hr over 240 Minutes Intravenous Every 12 hours 08/30/17 1134 08/31/17 0738   09/06/2017 1030  Ampicillin-Sulbactam (UNASYN) 3 g in sodium chloride 0.9 % 100 mL IVPB     3 g 200 mL/hr over 30 Minutes Intravenous  Once 08/27/2017 0953 09/09/2017 1246   08/22/2017 0000  ampicillin-sulbactam (UNASYN) 1.5 g in sodium chloride  0.9 % 100 mL IVPB  Status:  Discontinued     1.5 g 200 mL/hr over 30 Minutes Intravenous Once 08/28/17 1045 08/20/2017 0928   08/23/2017 2200  piperacillin-tazobactam (ZOSYN) IVPB 3.375 g    Note to Pharmacy:  Zosyn 3.375 g IV q12h in ESRD on HD   3.375 g 12.5 mL/hr over 240 Minutes Intravenous Every 12 hours 09/12/2017 1155 08/25/17 1335   08/22/17 1000  piperacillin-tazobactam (ZOSYN) IVPB 3.375 g  Status:  Discontinued    Note to Pharmacy:  Zosyn 3.375 g IV q12h in ESRD on HD   3.375 g 12.5 mL/hr over 240 Minutes Intravenous Every 12 hours 08/22/17 0128 09/13/2017 1155   08/22/17 0045  piperacillin-tazobactam (ZOSYN) IVPB 3.375 g     3.375 g 12.5 mL/hr over 240 Minutes Intravenous  Once 08/22/17 0038 08/22/17 0145       Assessment/Plan HTN ESRD - HD MWF H/o breast cancer  Acute cholecystitis POD13s/p lap chole4/6/19 by Dr. Redmond Pulling - CT scan 4/8 w/ small amount fluid around liver and layering in pelvis ; HIDA 4/9 positive for bile leak  -drain placed for biloma.  -discussed patient with IR who discussed with GI.  GI will evaluate the patient and plan for hopeful ERCP in the next couple days.  If the patient is unsafe for or unsuccessful ERCP attempt then IR will evaluate for PTC placement. -this was all discussed with the patient's sons who understand and agree with this plan. -LFTs significantly improving  VDRF secondary to aspiration/PNA -CCMfollowing - appreciate assistance. Started on Unasyn  Leukocytosis Likely secondary to aspiration PNA and bile leak (which is fairly well controlled with her currently drain)  On Zosyn.  Elevated troponins Felt to possibly be demand ischemia and less likely to be true MI  ID -zosyn 4/4>>4/7,Unasyn 4/11-->4/12, Zosyn 4/12 --> FEN -NPO/OGT - can likely start TFs when ok with all services. VTE -SCDs Foley -none, anuric  Follow up -Dr. Redmond Pulling  Plan-  Drain with 225cc yesterday, increased from 75cc. Discussed case with  IR and GI.  GI is going to evaluate the patient with plans for ERCP in the next couple of days.  Patient has stabilized more and is now off pressors and improving.  If GI feels an ERCP is unsafe or unsuccessful, then IR will consider PTC drain placement to help control leak.    LOS: 13 days    Henreitta Cea , [facility] 09/04/2017, 7:38 AM Pager: [phone]

## 2017-09-04 NOTE — Progress Notes (Signed)
Referring Physician(s): Dr. Rosendo Gros  Supervising Physician: Arne Cleveland  Patient Status:  [facility] - In-pt  Chief Complaint: Bile leak  Subjective: Intubated.  More alert, answers with nods.  Son at bedside.   Allergies: Erythromycin and Vicodin [hydrocodone-acetaminophen]  Medications: Prior to Admission medications   Medication Sig Start Date End Date Taking? Authorizing Provider  amLODipine (NORVASC) 10 MG tablet Take 10 mg by mouth every evening.  03/31/13  Yes [provider]  calcium acetate (PHOSLO) 667 MG capsule Take 2 capsules (1,334 mg total) by mouth 3 (three) times daily with meals. 09/15/15  Yes Dana Allan I, MD  cloNIDine (CATAPRES) 0.1 MG tablet Take 0.1 mg by mouth every evening.  03/24/13  Yes [provider]  fluticasone (FLONASE) 50 MCG/ACT nasal spray Place 2 sprays into both nostrils daily. 09/15/15  Yes Dana Allan I, MD  hydrALAZINE (APRESOLINE) 50 MG tablet Take 50 mg by mouth every evening.  04/14/13  Yes [provider]  multivitamin (RENA-VIT) TABS tablet Take 1 tablet by mouth at bedtime. 09/15/15  Yes Bonnell Public, MD  tamoxifen (NOLVADEX) 20 MG tablet Take 1 tablet (20 mg total) by mouth daily. Patient taking differently: Take 20 mg by mouth every evening.  02/12/17  Yes Causey, Charlestine Massed, NP  hydrOXYzine (ATARAX/VISTARIL) 25 MG tablet Take 1 tablet (25 mg total) by mouth every 8 (eight) hours as needed for itching. Patient not taking: Reported on 08/22/2017 09/15/15   Dana Allan I, MD     Vital Signs: BP (!) 94/51   Pulse 72   Temp (!) 97.4 F (36.3 C) (Oral)   Resp (!) 25   Ht 5' 2"  (1.575 m)   Wt 145 lb 1 oz (65.8 kg)   SpO2 99%   BMI 26.53 kg/m   Physical Exam  Nursing note and vitals reviewed. NAD, open eyes Intubated Abdomen:  RUQ drain in place.  Insertion site c/d/i.  Yellow bilious output in collection bag with 75-250 mL output per day.   Imaging: Ct Abdomen Pelvis Wo  Contrast  Result Date: 09/03/2017 CLINICAL DATA:  84 year old female with a history of biloma. EXAM: CT ABDOMEN AND PELVIS WITHOUT CONTRAST TECHNIQUE: Multidetector CT imaging of the abdomen and pelvis was performed following the standard protocol without IV contrast. COMPARISON:  Chest x-ray 09/03/2017, nuclear medicine study 09/01/2017, prior CT 08/26/2017, 08/26/2017 FINDINGS: Lower chest: Nodular airspace opacity of right middle lobe, bilateral lower lobes, similar distribution to prior CT and plain film studies. No pleural effusion. Native coronary calcifications. Hepatobiliary: Liver similar to prior studies. There is trace fluid remaining on the dome of the liver, though significantly decreased in volume from the CT dated 08/26/2017. Pigtail drainage catheter terminates adjacent to segment 6 of the liver. Residual thin fluid within the gallbladder fossa with surgical clips from prior cholecystectomy. Fluid measures 7.2 cm transversely and approximately 17 mm cranial caudal. No intrahepatic or extrahepatic biliary ductal dilatation. Pancreas: Unremarkable pancreas. There is rounded tissue inseparable from the distal pancreatic tail, adjacent to the spleen. The tissue is similar density of spleen tissue. Overall, this focus of tissue is unchanged dating to 2011, and is most likely accessory splenic tissue/splenule. Spleen: Unremarkable spleen Adrenals/Urinary Tract: Unremarkable adrenal glands. Bilateral kidneys atrophic. Low-density lesions, unchanged over the course of several CT studies dating to 2011. None of these lesions are completely characterized on the current CT. Stomach/Bowel: Enteric contrast within stomach, small bowel, extending to the colon. No extraluminal contrast identified. No transition point. No dilated  small bowel or colon. Questionable wall thickening of the length of the colon, poorly characterized with the exclusion of IV contrast. Colonic diverticular change without definite  associated inflammatory changes. Gastric tube terminates within the stomach. Vascular/Lymphatic: Calcifications of the abdominal aorta and bilateral iliac arteries. Periaortic lymph nodes, none of which are enlarged. No pelvic adenopathy. No mesenteric adenopathy. Reproductive: Hysterectomy Other: Dependent low-density fluid within the pelvis without evidence of loculation. Volume is unchanged from the comparison CT. Interval resolution of peritoneal air. Stranding of the mesenteric fat associated with small bowel and colon. Stranding of the abdominal wall fat/edema. Musculoskeletal: No acute displaced fracture. Multilevel degenerative changes of the visualized thoracolumbar spine. Degenerative changes of the hips. IMPRESSION: Pigtail drainage catheter at the inferior liver margin, with overall decreased volume of perihepatic fluid in this patient with a history of biloma. There is trace retained fluid in the gallbladder fossa, status post cholecystectomy. Similar volume of left subdiaphragmatic low-density fluid, as well as low-density fluid layered within the dependent pelvis. Neither of these collections appear loculated, potentially representing reactive ascites or additional sites of bile accumulation, less likely abscess. Questionable colonic wall thickening throughout the length of the colon. This may be seen with anasarca/edema and a positive fluid status, or alternatively could represent inflammatory/infectious colitis. No evidence of bowel obstruction. Multifocal airspace disease of the lung bases, concerning for infection. Mesenteric and body wall anasarca/edema. Aortic Atherosclerosis (ICD10-I70.0). Electronically Signed   By: Corrie Mckusick D.O.   On: 09/03/2017 17:36   Nm Hepatobiliary Liver Func  Result Date: 09/01/2017 CLINICAL DATA:  Prior cholecystectomy. Postop bile leak. Previous percutaneous drainage catheter placement. Evaluate for persistent bile leak. EXAM: NUCLEAR MEDICINE HEPATOBILIARY  IMAGING TECHNIQUE: Sequential images of the abdomen were obtained out to 60 minutes following intravenous administration of radiopharmaceutical. RADIOPHARMACEUTICALS:  5.5 mCi Tc-14m Choletec IV COMPARISON:  Prior hepatobiliary imaging on 08/27/2017 FINDINGS: Prompt uptake and excretion of activity by the liver is seen. Gallbladder activity is absent, consistent with prior cholecystectomy. Biliary activity initially accumulates and persists in a rounded collection in the right subhepatic space, which appears increased in size compared to previous study. There is no evidence of free intraperitoneal leakage of biliary activity. Subsequent images show passage of some biliary contrast into the small bowel. IMPRESSION: Excreted biliary activity accumulates in a right subhepatic fluid collection, consistent with biloma. This appears increased in size compared to previous study. Consider abdomen pelvis CT for further evaluation. Electronically Signed   By: JEarle GellM.D.   On: 09/01/2017 15:57   UKoreaAbdomen Complete  Result Date: 09/01/2017 CLINICAL DATA:  Bile leak, postoperative. History of cholecystectomy. History of hypertension. EXAM: ABDOMEN ULTRASOUND COMPLETE COMPARISON:  None. FINDINGS: Gallbladder: Surgically absent. Common bile duct: Diameter: 5 mm Liver: No focal lesion identified. Within normal limits in parenchymal echogenicity. Small amount of free fluid adjacent to the liver, approximating the gallbladder fossa. Portal vein is patent on color Doppler imaging with normal direction of blood flow towards the liver. IVC: No abnormality visualized. Pancreas: Visualized portion unremarkable. Spleen: Size and appearance within normal limits. Right Kidney: Length: 6 cm. Poorly seen. Echogenic suggesting chronic medical renal disease. Hypoechoic masses within the RIGHT kidney measuring 1.9 cm and 1 cm respectively, likely cysts. Left Kidney: Not seen.  Appeared similarly atrophic on recent CT. Abdominal aorta:  No aneurysm visualized. Other findings: Ascites within the LEFT upper quadrant. IMPRESSION: 1. Status post cholecystectomy. Small amount of fluid adjacent to the liver near the cholecystectomy site, and expected amount  status post recent cholecystectomy, although nuclear medicine study of 08/27/2017 described a probable bile leak. 2. Atrophic kidneys. Probable RIGHT renal cysts, too small to definitively characterize. Cysts were described on previous CT report. 3. LEFT upper quadrant ascites. Electronically Signed   By: Franki Cabot M.D.   On: 09/01/2017 12:00   Dg Chest Port 1 View  Result Date: 09/04/2017 CLINICAL DATA:  Shortness of breath EXAM: PORTABLE CHEST 1 VIEW COMPARISON:  09/03/2017 FINDINGS: Cardiac shadow is stable. Endotracheal tube and nasogastric catheter is well as a left jugular central line are again seen and stable. Diffuse infiltrates are noted bilaterally right greater than left without significant change. No new focal abnormality is noted. IMPRESSION: Stable bilateral infiltrates. Electronically Signed   By: Inez Catalina M.D.   On: 09/04/2017 08:10   Dg Chest Port 1 View  Result Date: 09/03/2017 CLINICAL DATA:  84 year old female intubated.  Subsequent encounter. EXAM: PORTABLE CHEST 1 VIEW COMPARISON:  09/02/2017 chest x-ray. FINDINGS: Endotracheal tube tip 2.6 cm above the carina. Left central line tip proximal to mid superior vena cava level. Nasogastric tube in place with side hole gastric body level. Tip not included on present exam. Diffuse slightly asymmetric airspace disease without significant change from prior exam. No gross pneumothorax. Heart size top-normal. Calcified aorta. No acute osseous abnormality. Pigtail catheter right upper quadrant. IMPRESSION: Diffuse asymmetric airspace disease without significant change from prior exam. Aortic Atherosclerosis (ICD10-I70.0). Electronically Signed   By: Genia Del M.D.   On: 09/03/2017 06:47   Dg Chest Port 1  View  Result Date: 09/02/2017 CLINICAL DATA:  Followup ventilator support. EXAM: PORTABLE CHEST 1 VIEW COMPARISON:  09/01/2017 FINDINGS: Endotracheal tube tip is 1.5 cm above the carina. Nasogastric tube enters the abdomen. Left internal jugular central line tip is unchanged, in the SVC above the right atrium. Bilateral airspace filling persists, right more than left, probably representing pneumonia. This shows mild generalized improvement. IMPRESSION: Lines and tubes well positioned. Persistent bilateral airspace filling, showing mild generalized improvement. Electronically Signed   By: Nelson Chimes M.D.   On: 09/02/2017 07:10   Dg Chest Port 1 View  Result Date: 09/01/2017 CLINICAL DATA:  Endotracheal tube present EXAM: PORTABLE CHEST 1 VIEW COMPARISON:  Chest x-rays dated 08/31/2017 and 08/30/2017. FINDINGS: Endotracheal tube remains well positioned with tip approximately 2-3 cm above the carina. LEFT IJ central line is stable in position with tip at the level of the mid SVC. Enteric tube passes below the diaphragm. The bilateral airspace opacities are not significantly changed compared to yesterday's exam, improved compared to the earlier study of 08/30/2017. No new lung findings. No pneumothorax seen. IMPRESSION: 1. Bilateral airspace opacities, pulmonary edema versus pneumonia, not significantly changed compared to yesterday's exam, but improved compared to the earlier study of 08/30/2017. 2. Support apparatus remains appropriately positioned. Electronically Signed   By: Franki Cabot M.D.   On: 09/01/2017 07:28    Labs:  CBC: Recent Labs    09/01/17 1816 09/02/17 0407 09/03/17 0355 09/04/17 0445  WBC 31.0* 32.3* 26.4* 25.2*  HGB 9.5* 10.1* 10.0* 9.8*  HCT 28.6* 29.3* 28.4* 29.2*  PLT 144* 148* 120* 95*    COAGS: Recent Labs    08/28/17 1258 09/04/17 0923  INR 1.53 2.98  APTT  --  64*    BMP: Recent Labs    09/02/17 1807 09/03/17 0355 09/03/17 1449 09/04/17 0445  NA 131*  132* 132* 132*  K 4.0 3.7 4.0 4.1  CL 97* 99* 98* 100*  CO2 24 25 25 26   GLUCOSE 86 112* 109* 102*  BUN 6 8 8 6   CALCIUM 7.9* 7.8* 8.0* 8.0*  CREATININE 1.54* 1.53* 1.35* 1.29*  GFRNONAA 30* 31* 35* 37*  GFRAA 35* 35* 41* 43*    LIVER FUNCTION TESTS: Recent Labs    09/01/17 0357  09/02/17 0407 09/02/17 1807 09/03/17 0355 09/03/17 1449 09/04/17 0445  BILITOT 3.2*  --  4.2*  --  4.9*  --  4.6*  AST 1,244*  --  652*  --  315*  --  157*  ALT 689*  --  515*  --  337*  --  234*  ALKPHOS 260*  --  336*  --  442*  --  510*  PROT 5.2*  --  5.7*  --  5.8*  --  5.9*  ALBUMIN 1.7*  1.7*   < > 1.8*  1.7* 1.7* 1.6*  1.7* 1.7* 1.7*   < > = values in this interval not displayed.    Assessment and Plan: Acute Cholecystitis s/p lap chole 09/02/2017, now with bile leak Patient remains intubated. Tbili 4.9  CT Abdomen Pelvis yesterday showed: Pigtail drainage catheter at the inferior liver margin, with overall decreased volume of perihepatic fluid in this patient with a history of biloma. There is trace retained fluid in the gallbladder fossa, status post cholecystectomy.  Similar volume of left subdiaphragmatic low-density fluid, as well as low-density fluid layered within the dependent pelvis. Neither of these collections appear loculated, potentially representing reactive ascites or additional sites of bile accumulation, less likely abscess.  Plans for ERCP not yet finalized due to acuity of condition.  No plans for PTC at this time due to lack of ductal dilatation and leak currently controlled with drain in place.   Drain remains intact today with dark bilious output.  Continue drain care.  IR to follow.   Electronically Signed: Docia Barrier, PA 09/04/2017, 3:16 PM   I spent a total of 15 Minutes at the the patient's bedside AND on the patient's [facility] floor or unit, greater than 50% of which was counseling/coordinating care for bile leak.

## 2017-09-04 NOTE — Progress Notes (Addendum)
ANTICOAGULATION CONSULT NOTE - Initial Consult  Pharmacy Consult for bivalirudin. 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 Erythromycin Nausea And Vomiting  . Vicodin [Hydrocodone-Acetaminophen]     unknown    Patient Measurements: Height: 5\' 2"  (157.5 cm) Weight: 145 lb 1 oz (65.8 kg) IBW/kg (Calculated) : 50.1 Heparin Dosing Weight: 63.58 kg  Vital Signs: Temp: 97.4 F (36.3 C) (04/17 1127) Temp Source: Oral (04/17 1127) BP: 94/51 (04/17 1400) Pulse Rate: 72 (04/17 1400)  Labs: Recent Labs    09/02/17 0407  09/03/17 0355 09/03/17 1449 09/04/17 0445 09/04/17 0923  HGB 10.1*  --  10.0*  --  9.8*  --   HCT 29.3*  --  28.4*  --  29.2*  --   PLT 148*  --  120*  --  95*  --   APTT  --   --   --   --   --  64*  LABPROT  --   --   --   --   --  30.7*  INR  --   --   --   --   --  2.98  CREATININE 1.32*   < > 1.53* 1.35* 1.29*  --    < > = values in this interval not displayed.    Estimated Creatinine Clearance: 29.9 mL/min (A) (by C-G formula based on SCr of 1.29 mg/dL (H)).   Medical History: Past Medical History:  Diagnosis Date  . Arthritis   . Blood transfusion without reported diagnosis   . Cancer [facility])    patient states she had br ca 13 years ago  . ESRD (end stage renal disease) on dialysis ([place])   . Hypertension   . Renal disorder    patient on dialysis  . 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 Patient receiving CRRT and has elevated LFTs, will use bivalirudin with dosing 0.05mg /kg/hr.   Goal of Therapy:  aPTT 50-85 secs (will start despite elevated aPTT given new clot for goal closer to 80-85)   Plan:  Stop heparin SQ HIT antibody and SRA ordered  Start Bivalirudin 0.05mg /kg/hr (6.28mL/hr) Repeat aPTT in 2 hours, will check every 2 hours until at goal, then daily per HIT protocol Monitor CBC and  for signs and symptoms of bleeding  Levonne Lapping, PharmD Candidate 09/04/2017,2:33 PM  I discussed / reviewed the pharmacy note by Ms. Stone, PharmD Candidate and I agree with the student's findings and plans as documented.  Sloan Leiter, PharmD, BCPS, BCCCP Clinical Pharmacist Clinical phone 09/05/2017 until 3:30PM [phone] After hours, please call [phone]

## 2017-09-04 NOTE — Progress Notes (Addendum)
PULMONARY / CRITICAL CARE MEDICINE   Name: JANEANN PAISLEY MRN: [id number] DOB: Apr 23, 1933    ADMISSION DATE:  09/03/2017 CONSULTATION DATE:  4/11  REFERRING MD:  Dr. Maudie Mercury  CHIEF COMPLAINT:  Aspiration  BRIEF SUMMARY:   Ms. Emoree Sasaki is a 84 year old female with ESRD on MWF HD, HTN, Anemia, and hx of breast cancer who presented to the ED on 4/4 with RLQ abdominal pain. She was found to have acute cholecystitis with a gallstone lodged in the gallbladder neck. CBD measured up to 10 mm. She underwent laparoscopic cholecystectomy on 4/6. She initially did well but on 4/8 was noted to have worsened pain and confusion and thought to have post-op ileus. CT abd/pelvis 4/8 showed small-mod fluid in the abdominal pelvis, read as normal post op changes but could not exclude a bile leak. A hepatobiliary scan was obtained on 4/9 which did suggest a bile leak. Surgery recommended IR drain and GI consult for ERCP. She went to the Endoscopy unit on 4/11 AM for ERCP however during prep with anesthesia she had significant bilious emesis with hypotension requiring intubation and pressor support. ERCP was not attempted. She was subsequently transferred to the medical ICU.   STUDIES:  CT abd/pelv 4/4 >> acute cholecystitis, gallstone in GB neck, CBD 10 mm  CT Head: no acute changes, chronic small vessel ischemic changes  CT abd/pelvis 4/8 >> small-mod fluid in abd/pelvis  Hepatobiliary scan 4/9 >> bile leak  TTE 4/12 >> EF 65-70%, G1DD, narrow LVOT, mild TR, mild pHTN  U/S Abdomen 4/14 >> small fluid near cholecystectomy site  Hepatobiliary scan 4/14 >> Rt subhepatic fluid collection consistent w/ biloma; increased in size from prior  CT abd/pelvis w/ oral contrast 4/16 >> drainage catheter at inferior liver margin with decreased perihepatic fluid; low-density fluid at left subdiaphragmatic area, questionable colonic wall thickening throughout, multifocal airspace disease of lung  bases   CULTURES: Tracheal aspirate 4/11 >> no growth Abd fluid 4/11 >> no growth 5 days Abd fluid 4/15 >>  ANTIBIOTICS: Zosyn 4/3 >> 4/7 Unasyn 4/11 Zosyn 4/12 >>  LINES/TUBES: ETT 4/11 OGT 4/11 PIV RUE 4/9 RLQ Biliary Drain 4/11 HD Cath [facility] 4/12  SIGNIFICANT EVENTS: 4/4 - admitted w/ acute cholecystitis 4/6 - lap chole 4/9 - bile leak seen on hepatobiliary scan 4/11 - Aspirated during prep for ERCP, intubated and transferred to ICU 4/11 - Rt abd fluid drain placed by IR 4/12- Heparin gtt started for NSTEMI; held after OG bleeding and trop peak 4/13 - worsening liver enzyymes. ESRD - Anuric. CRRT started. On levophed 58mcg and fent 135mcg 4/14 - Hgb 6.0, transfused 2 units PRBCs   SUBJECTIVE/OVERNIGHT/INTERVAL HX Off levophed 4/16. LUE with increased swelling, U/S not performed yesterday. Nods head "no" when asked if in pain.  VITAL SIGNS: BP (!) 115/58   Pulse 76   Temp (!) 97.4 F (36.3 C) (Oral)   Resp 19   Ht 5\' 2"  (1.575 m)   Wt 145 lb 1 oz (65.8 kg)   SpO2 97%   BMI 26.53 kg/m   HEMODYNAMICS:    VENTILATOR SETTINGS: Vent Mode: PRVC FiO2 (%):  [30 %] 30 % Set Rate:  [27 bmp] 27 bmp Vt Set:  [300 mL] 300 mL PEEP:  [5 cmH20] 5 cmH20 Pressure Support:  [12 cmH20] 12 cmH20 Plateau Pressure:  [9 cmH20-20 cmH20] 20 cmH20  INTAKE / OUTPUT: I/O last 3 completed shifts: In: 3449.9 [P.O.:600; I.V.:2539.9; NG/GT:60; IV Piggyback:250] Out: 3408 [Emesis/NG output:450; Drains:300;  Other:2658]  PHYSICAL EXAMINATION: General: elderly woman, intubated HEENT: ETT/OGT in place, [facility] HD cath in place Cardiac: RRR, no rubs, murmurs or gallops Pulm: rhonchi anteriorly R > L Abd: Rt abdominal fluid drain in place Ext: LUE AVF w/ palpable thrill, LUE swollen Neuro: opens eyes, tracking, following commands (lifts arms up, wiggles toes), nods head  PULMONARY Recent Labs  Lab 08/30/17 0252 08/31/17 0252 09/01/17 0834 09/01/17 1151 09/02/17 0342 09/03/17 0315   PHART 7.369 7.275* 7.373 7.323* 7.307* 7.354  PCO2ART 53.7* 55.8* 49.3* 52.7* 50.9* 48.8*  PO2ART 89.0 126.0* 161.0* 87.0 97.0 90.3  HCO3 31.0* 26.1 28.7* 27.5 25.4 26.8  TCO2 33* 28 30 29 27   --   O2SAT 96.0 98.0 99.0 96.0 97.0 97.3    CBC Recent Labs  Lab 09/02/17 0407 09/03/17 0355 09/04/17 0445  HGB 10.1* 10.0* 9.8*  HCT 29.3* 28.4* 29.2*  WBC 32.3* 26.4* 25.2*  PLT 148* 120* 95*    COAGULATION Recent Labs  Lab 08/28/17 1258  INR 1.53    CARDIAC   Recent Labs  Lab 09/03/2017 1708 08/30/17 0057 08/30/17 0454 08/30/17 1100 08/31/17 0315  TROPONINI 1.40* 3.17* 4.47* 3.62* 2.86*   No results for input(s): PROBNP in the last 168 hours.   CHEMISTRY Recent Labs  Lab 08/31/17 0315  09/01/17 0357  09/02/17 0407 09/02/17 1807 09/03/17 0355 09/03/17 1449 09/04/17 0445  NA 135   < > 136   < > 133* 131* 132* 132* 132*  K 5.0   < > 3.8   < > 3.6 4.0 3.7 4.0 4.1  CL 100*   < > 102   < > 99* 97* 99* 98* 100*  CO2 21*   < > 26   < > 26 24 25 25 26   GLUCOSE 143*   < > 99   < > 120* 86 112* 109* 102*  BUN 40*   < > 12   < > 8 6 8 8 6   CREATININE 4.96*   < > 1.88*   < > 1.32* 1.54* 1.53* 1.35* 1.29*  CALCIUM 8.5*   < > 8.0*   < > 7.8* 7.9* 7.8* 8.0* 8.0*  MG 2.2  --  2.1  --  2.1  --  2.0  --  2.2  PHOS 5.4*   < > 1.8*   < > 2.0* 3.3 2.9 2.3* 2.3*   < > = values in this interval not displayed.   Estimated Creatinine Clearance: 29.9 mL/min (A) (by C-G formula based on SCr of 1.29 mg/dL (H)).  I/O last 3 completed shifts: In: 3449.9 [P.O.:600; I.V.:2539.9; NG/GT:60; IV Piggyback:250] Out: 3408 [Emesis/NG output:450; Drains:300; Other:2658]   Intake/Output Summary (Last 24 hours) at 09/04/2017 0902 Last data filed at 09/04/2017 0800 Gross per 24 hour  Intake 2182.5 ml  Output 2313 ml  Net -130.5 ml     LIVER Recent Labs  Lab 08/28/17 1258  08/31/17 0315  09/01/17 0357  09/02/17 0407 09/02/17 1807 09/03/17 0355 09/03/17 1449 09/04/17 0445  AST  --     < > 2,629*  --  1,244*  --  652*  --  315*  --  157*  ALT  --    < > 912*  --  689*  --  515*  --  337*  --  234*  ALKPHOS  --    < > 253*  --  260*  --  336*  --  442*  --  510*  BILITOT  --    < >  3.5*  --  3.2*  --  4.2*  --  4.9*  --  4.6*  PROT  --    < > 5.6*  --  5.2*  --  5.7*  --  5.8*  --  5.9*  ALBUMIN  --    < > 1.9*  1.9*   < > 1.7*  1.7*   < > 1.8*  1.7* 1.7* 1.6*  1.7* 1.7* 1.7*  INR 1.53  --   --   --   --   --   --   --   --   --   --    < > = values in this interval not displayed.     INFECTIOUS Recent Labs  Lab 08/30/17 1501 08/31/17 0906 09/01/17 0018  LATICACIDVEN 4.7* 1.1 1.8     ENDOCRINE CBG (last 3)  Recent Labs    09/03/17 2042 09/03/17 2332 09/04/17 0431  GLUCAP 60* 103* 81         IMAGING x48h  - image(s) personally visualized  -   highlighted in bold Ct Abdomen Pelvis Wo Contrast  Result Date: 09/03/2017 CLINICAL DATA:  84 year old female with a history of biloma. EXAM: CT ABDOMEN AND PELVIS WITHOUT CONTRAST TECHNIQUE: Multidetector CT imaging of the abdomen and pelvis was performed following the standard protocol without IV contrast. COMPARISON:  Chest x-ray 09/03/2017, nuclear medicine study 09/01/2017, prior CT 08/26/2017, 08/20/2017 FINDINGS: Lower chest: Nodular airspace opacity of right middle lobe, bilateral lower lobes, similar distribution to prior CT and plain film studies. No pleural effusion. Native coronary calcifications. Hepatobiliary: Liver similar to prior studies. There is trace fluid remaining on the dome of the liver, though significantly decreased in volume from the CT dated 08/26/2017. Pigtail drainage catheter terminates adjacent to segment 6 of the liver. Residual thin fluid within the gallbladder fossa with surgical clips from prior cholecystectomy. Fluid measures 7.2 cm transversely and approximately 17 mm cranial caudal. No intrahepatic or extrahepatic biliary ductal dilatation. Pancreas: Unremarkable pancreas. There  is rounded tissue inseparable from the distal pancreatic tail, adjacent to the spleen. The tissue is similar density of spleen tissue. Overall, this focus of tissue is unchanged dating to 2011, and is most likely accessory splenic tissue/splenule. Spleen: Unremarkable spleen Adrenals/Urinary Tract: Unremarkable adrenal glands. Bilateral kidneys atrophic. Low-density lesions, unchanged over the course of several CT studies dating to 2011. None of these lesions are completely characterized on the current CT. Stomach/Bowel: Enteric contrast within stomach, small bowel, extending to the colon. No extraluminal contrast identified. No transition point. No dilated small bowel or colon. Questionable wall thickening of the length of the colon, poorly characterized with the exclusion of IV contrast. Colonic diverticular change without definite associated inflammatory changes. Gastric tube terminates within the stomach. Vascular/Lymphatic: Calcifications of the abdominal aorta and bilateral iliac arteries. Periaortic lymph nodes, none of which are enlarged. No pelvic adenopathy. No mesenteric adenopathy. Reproductive: Hysterectomy Other: Dependent low-density fluid within the pelvis without evidence of loculation. Volume is unchanged from the comparison CT. Interval resolution of peritoneal air. Stranding of the mesenteric fat associated with small bowel and colon. Stranding of the abdominal wall fat/edema. Musculoskeletal: No acute displaced fracture. Multilevel degenerative changes of the visualized thoracolumbar spine. Degenerative changes of the hips. IMPRESSION: Pigtail drainage catheter at the inferior liver margin, with overall decreased volume of perihepatic fluid in this patient with a history of biloma. There is trace retained fluid in the gallbladder fossa, status post cholecystectomy. Similar volume of left subdiaphragmatic low-density fluid,  as well as low-density fluid layered within the dependent pelvis. Neither  of these collections appear loculated, potentially representing reactive ascites or additional sites of bile accumulation, less likely abscess. Questionable colonic wall thickening throughout the length of the colon. This may be seen with anasarca/edema and a positive fluid status, or alternatively could represent inflammatory/infectious colitis. No evidence of bowel obstruction. Multifocal airspace disease of the lung bases, concerning for infection. Mesenteric and body wall anasarca/edema. Aortic Atherosclerosis (ICD10-I70.0). Electronically Signed   By: Corrie Mckusick D.O.   On: 09/03/2017 17:36   Dg Chest Port 1 View  Result Date: 09/04/2017 CLINICAL DATA:  Shortness of breath EXAM: PORTABLE CHEST 1 VIEW COMPARISON:  09/03/2017 FINDINGS: Cardiac shadow is stable. Endotracheal tube and nasogastric catheter is well as a left jugular central line are again seen and stable. Diffuse infiltrates are noted bilaterally right greater than left without significant change. No new focal abnormality is noted. IMPRESSION: Stable bilateral infiltrates. Electronically Signed   By: Inez Catalina M.D.   On: 09/04/2017 08:10   Dg Chest Port 1 View  Result Date: 09/03/2017 CLINICAL DATA:  84 year old female intubated.  Subsequent encounter. EXAM: PORTABLE CHEST 1 VIEW COMPARISON:  09/02/2017 chest x-ray. FINDINGS: Endotracheal tube tip 2.6 cm above the carina. Left central line tip proximal to mid superior vena cava level. Nasogastric tube in place with side hole gastric body level. Tip not included on present exam. Diffuse slightly asymmetric airspace disease without significant change from prior exam. No gross pneumothorax. Heart size top-normal. Calcified aorta. No acute osseous abnormality. Pigtail catheter right upper quadrant. IMPRESSION: Diffuse asymmetric airspace disease without significant change from prior exam. Aortic Atherosclerosis (ICD10-I70.0). Electronically Signed   By: Genia Del M.D.   On: 09/03/2017  06:47     DISCUSSION: Ms. Philip Eckersley is a 84 year old female with ESRD on MWF HD, HTN, Anemia, and hx of breast cancer who was admitted 4/4 with acute cholecystitis. S/p lap chole 4/6 complicated by post-op bile leak. Went for ERCP 4/11 however developed bilious emesis during prep, hypotension, and required intubation plus pressor support. Transferred to the ICU on 4/11.  ASSESSMENT / PLAN: PULMONARY A: Acute Respiratory Distress 2/2 Aspiration Intubated 4/11 P:   Continue vent support - 6 cc/kg IBW for ARDS Wake up assessments and SBT w/o extubation Monitor CXR  CARDIOVASCULAR A:  #baseline Aortic Atherosclerosis on CXR RBBB - present prior to admission #current Septic Shock/Hypotension Troponin elevation (peak 4.47) 2/2 demand ischemia in setting of hypotensive event Echocardiogram w/ EF 65-70% Prolonged QTc LUE Edema P:  Continue levophed for MAP >65, titrate off as able ASA 81 mg daily Check LUE U/S >> prelim read positive for DVT  RENAL A:   ESRD on MWF HD AVF LUE P:   CRRT per renal  GASTROINTESTINAL A:   Acute Cholecystitis s/p lap chole 4/6 Bile leak s/p IR Rt abdominal fluid drain 4/11 - biloma by HIDA 4/14 Shock Liver Nutrition SUP P:   Rt abdominal fluid drain in place Surgery and GI following - ERCP if able this week Consider initiating tube feeds Protonix per tube  HEMATOLOGIC A:   Hx of Breast Cancer - no acute issue Anemia of Chronic Disease -  S/p 2 units PRBCs 4/14 Leukocytosis Thrombocytopenia LUE DVT - per prelim U/S read  P:  Monitor Hgb, transfuse for Hgb <7.0 Monitor for bleeding Will need anticoagulation, however tricky with platelets trending down and recent need for PRBC transfusion (Hgb 6.0 on 4/14 >> 9.8  on 4/17) Check HIT ab, serotonin release assay Will start Bivalirudin for anticoagulation given hepatic dysfunction  INFECTIOUS A:   Septic Shock from Aspiration PNA Bile Leak s/p IR Biliary Drain 4/11 P:   Zosyn  4/12 >>  Add Vancomycin if further decompensation F/u body fluid cultures  ENDOCRINE A:   Secondary Hyperparathyroidism Hypoglycemia P:   D5 @ 40 cc/hr  NEUROLOGIC A:   Pain/Agitation P:   RASS goal: 0 to -1 Fentanyl gtt Versed prn   FAMILY  - Updates: Son updated at bedside 4/17  - Inter-disciplinary family meet or Palliative Care meeting due by:  09/05/17   Zada Finders, MD Internal Medicine PGY-3  09/04/2017 9:02 AM

## 2017-09-04 NOTE — Progress Notes (Deleted)
Daily Rounding Note  09/04/2017, 12:47 PM  LOS: 13 days   SUBJECTIVE:   Remains on vent and CRRT.  Has partial thrombosis in left IJ vein.  Started on Angiomax gtt instead of Heparin.   300 >> 160 >> 75 >> 225 ml of bile output to catheter in previous 4 days, ~ 50 ml so far today.     Fentanyl drip continues.   Off Precedex as if 4/14.  Off levophed as of 4/16.  CT ab pelvis 4/16: decreased peri-hepatic fluid.  Stable fluid in left sub-diaphragm and pelvis.  Thickened colon wall.  Mesenteric and body wall edema.  Multifocal PNA.      OBJECTIVE:         Vital signs in last 24 hours:    Temp:  [97.3 F (36.3 C)-97.8 F (36.6 C)] 97.4 F (36.3 C) (04/17 1127) Pulse Rate:  [68-86] 71 (04/17 1230) Resp:  [13-28] 14 (04/17 1230) BP: (95-135)/(49-68) 112/53 (04/17 1230) SpO2:  [96 %-100 %] 98 % (04/17 1230) FiO2 (%):  [30 %] 30 % (04/17 1200) Weight:  [145 lb 1 oz (65.8 kg)] 145 lb 1 oz (65.8 kg) (04/17 0444) Last BM Date: 09/03/17 Filed Weights   09/03/17 0323 09/03/17 2000 09/04/17 0444  Weight: 141 lb 1.5 oz (64 kg) 145 lb 1 oz (65.8 kg) 145 lb 1 oz (65.8 kg)   General: intubated.  Awakens to voice   Heart: RRR Chest: clear bil.  No resistance to vent.   Abdomen: brown, clear dark bile, 50 ml, in catheter bag.  Soft, active BS.  NT Extremities: swelling in upper and lower extremities.  More pronounced on left UE.   Neuro/Psych:  Follows commands to wiggle fingers.    Intake/Output from previous day: 04/16 0701 - 04/17 0700 In: 2315 [P.O.:600; I.V.:1505; NG/GT:60; IV Piggyback:150] Out: 2356 [Emesis/NG output:150; Drains:225]  Intake/Output this shift: Total I/O In: 405 [I.V.:295; NG/GT:60; IV Piggyback:50] Out: 694 [Other:694]  Lab Results: Recent Labs    09/02/17 0407 09/03/17 0355 09/04/17 0445  WBC 32.3* 26.4* 25.2*  HGB 10.1* 10.0* 9.8*  HCT 29.3* 28.4* 29.2*  PLT 148* 120* 95*   BMET Recent Labs      09/03/17 0355 09/03/17 1449 09/04/17 0445  NA 132* 132* 132*  K 3.7 4.0 4.1  CL 99* 98* 100*  CO2 25 25 26   GLUCOSE 112* 109* 102*  BUN 8 8 6   CREATININE 1.53* 1.35* 1.29*  CALCIUM 7.8* 8.0* 8.0*   LFT Recent Labs    09/02/17 0407  09/03/17 0355 09/03/17 1449 09/04/17 0445  PROT 5.7*  --  5.8*  --  5.9*  ALBUMIN 1.8*  1.7*   < > 1.6*  1.7* 1.7* 1.7*  AST 652*  --  315*  --  157*  ALT 515*  --  337*  --  234*  ALKPHOS 336*  --  442*  --  510*  BILITOT 4.2*  --  4.9*  --  4.6*  BILIDIR 2.7*  --  3.3*  --  2.8*  IBILI 1.5*  --  1.6*  --  1.8*   < > = values in this interval not displayed.   PT/INR Recent Labs    09/04/17 0923  LABPROT 30.7*  INR 2.98   Hepatitis Panel No results for input(s): HEPBSAG, HCVAB, HEPAIGM, HEPBIGM in the last 72 hours.  Studies/Results: Ct Abdomen Pelvis Wo Contrast  Result Date: 09/03/2017 CLINICAL DATA:  84 year old female  with a history of biloma. EXAM: CT ABDOMEN AND PELVIS WITHOUT CONTRAST TECHNIQUE: Multidetector CT imaging of the abdomen and pelvis was performed following the standard protocol without IV contrast. COMPARISON:  Chest x-ray 09/03/2017, nuclear medicine study 09/01/2017, prior CT 08/26/2017, 08/20/2017 FINDINGS: Lower chest: Nodular airspace opacity of right middle lobe, bilateral lower lobes, similar distribution to prior CT and plain film studies. No pleural effusion. Native coronary calcifications. Hepatobiliary: Liver similar to prior studies. There is trace fluid remaining on the dome of the liver, though significantly decreased in volume from the CT dated 08/26/2017. Pigtail drainage catheter terminates adjacent to segment 6 of the liver. Residual thin fluid within the gallbladder fossa with surgical clips from prior cholecystectomy. Fluid measures 7.2 cm transversely and approximately 17 mm cranial caudal. No intrahepatic or extrahepatic biliary ductal dilatation. Pancreas: Unremarkable pancreas. There is rounded  tissue inseparable from the distal pancreatic tail, adjacent to the spleen. The tissue is similar density of spleen tissue. Overall, this focus of tissue is unchanged dating to 2011, and is most likely accessory splenic tissue/splenule. Spleen: Unremarkable spleen Adrenals/Urinary Tract: Unremarkable adrenal glands. Bilateral kidneys atrophic. Low-density lesions, unchanged over the course of several CT studies dating to 2011. None of these lesions are completely characterized on the current CT. Stomach/Bowel: Enteric contrast within stomach, small bowel, extending to the colon. No extraluminal contrast identified. No transition point. No dilated small bowel or colon. Questionable wall thickening of the length of the colon, poorly characterized with the exclusion of IV contrast. Colonic diverticular change without definite associated inflammatory changes. Gastric tube terminates within the stomach. Vascular/Lymphatic: Calcifications of the abdominal aorta and bilateral iliac arteries. Periaortic lymph nodes, none of which are enlarged. No pelvic adenopathy. No mesenteric adenopathy. Reproductive: Hysterectomy Other: Dependent low-density fluid within the pelvis without evidence of loculation. Volume is unchanged from the comparison CT. Interval resolution of peritoneal air. Stranding of the mesenteric fat associated with small bowel and colon. Stranding of the abdominal wall fat/edema. Musculoskeletal: No acute displaced fracture. Multilevel degenerative changes of the visualized thoracolumbar spine. Degenerative changes of the hips. IMPRESSION: Pigtail drainage catheter at the inferior liver margin, with overall decreased volume of perihepatic fluid in this patient with a history of biloma. There is trace retained fluid in the gallbladder fossa, status post cholecystectomy. Similar volume of left subdiaphragmatic low-density fluid, as well as low-density fluid layered within the dependent pelvis. Neither of these  collections appear loculated, potentially representing reactive ascites or additional sites of bile accumulation, less likely abscess. Questionable colonic wall thickening throughout the length of the colon. This may be seen with anasarca/edema and a positive fluid status, or alternatively could represent inflammatory/infectious colitis. No evidence of bowel obstruction. Multifocal airspace disease of the lung bases, concerning for infection. Mesenteric and body wall anasarca/edema. Aortic Atherosclerosis (ICD10-I70.0). Electronically Signed   By: Corrie Mckusick D.O.   On: 09/03/2017 17:36   Dg Chest Port 1 View  Result Date: 09/04/2017 CLINICAL DATA:  Shortness of breath EXAM: PORTABLE CHEST 1 VIEW COMPARISON:  09/03/2017 FINDINGS: Cardiac shadow is stable. Endotracheal tube and nasogastric catheter is well as a left jugular central line are again seen and stable. Diffuse infiltrates are noted bilaterally right greater than left without significant change. No new focal abnormality is noted. IMPRESSION: Stable bilateral infiltrates. Electronically Signed   By: Inez Catalina M.D.   On: 09/04/2017 08:10   Dg Chest Port 1 View  Result Date: 09/03/2017 CLINICAL DATA:  84 year old female intubated.  Subsequent encounter. EXAM: PORTABLE  CHEST 1 VIEW COMPARISON:  09/02/2017 chest x-ray. FINDINGS: Endotracheal tube tip 2.6 cm above the carina. Left central line tip proximal to mid superior vena cava level. Nasogastric tube in place with side hole gastric body level. Tip not included on present exam. Diffuse slightly asymmetric airspace disease without significant change from prior exam. No gross pneumothorax. Heart size top-normal. Calcified aorta. No acute osseous abnormality. Pigtail catheter right upper quadrant. IMPRESSION: Diffuse asymmetric airspace disease without significant change from prior exam. Aortic Atherosclerosis (ICD10-I70.0). Electronically Signed   By: Genia Del M.D.   On: 09/03/2017 06:47    Scheduled Meds: . aspirin  81 mg Per Tube Daily  . chlorhexidine gluconate (MEDLINE KIT)  15 mL Mouth Rinse BID  . Chlorhexidine Gluconate Cloth  6 each Topical Daily  . darbepoetin (ARANESP) injection - DIALYSIS  150 mcg Intravenous Q Mon-HD  . doxercalciferol  2 mcg Intravenous Q M,W,F-HD  . insulin aspart  0-9 Units Subcutaneous Q4H  . mouth rinse  15 mL Mouth Rinse 10 times per day  . pantoprazole sodium  40 mg Per Tube Daily  . sodium chloride flush  10-40 mL Intracatheter Q12H   Continuous Infusions: . sodium chloride 10 mL/hr at 09/03/17 2300  . sodium chloride    . bivalirudin (ANGIOMAX) infusion 0.5 mg/mL (Non-ACS indications) 0.05 mg/kg/hr (09/04/17 1214)  . dextrose 40 mL/hr at 09/04/17 0921  . fentaNYL infusion INTRAVENOUS 150 mcg/hr (09/04/17 1200)  . norepinephrine (LEVOPHED) Adult infusion Stopped (09/03/17 2000)  . piperacillin-tazobactam Stopped (09/04/17 0840)  . dialysis replacement fluid (prismasate) 400 mL/hr at 09/04/17 0318  . dialysis replacement fluid (prismasate) 400 mL/hr at 09/04/17 0319  . dialysate (PRISMASATE) 1,250 mL/hr at 09/04/17 1013   PRN Meds:.acetaminophen **OR** acetaminophen, docusate, fentaNYL, glucagon (human recombinant), midazolam   ASSESMENT:   *  Post op bile leak.  4/6 lap chole.  ERCP postoponed due to pt instability.  Labile bile drain output.   LFTS, WBCs, improving.    *  Sepsis with shock, resp failure/VDRF, + Troponins (too sick for cardiac testing)  *  Partially thrombosed left int jugular vein per today's doppler.    *  Thrombocytopenia. Present for 4 days, well prior to Bivalrudin that started at noon today.    *  Normocytic anemia.  2 U PRBCs given on 4/14.    *  ESRD, pre-existing.  On CRRT currently.     PLAN   *  Continue supportive care and observation.      Azucena Freed  09/04/2017, 12:47 PM Phone [phone]

## 2017-09-04 NOTE — Progress Notes (Signed)
[place] for bivalirudin. 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 Erythromycin Nausea And Vomiting  . Vicodin [Hydrocodone-Acetaminophen]     unknown    Patient Measurements: Height: 5\' 2"  (157.5 cm) Weight: 145 lb 1 oz (65.8 kg) IBW/kg (Calculated) : 50.1 Heparin Dosing Weight: 63.58 kg  Vital Signs: Temp: 96.9 F (36.1 C) (04/17 1520) Temp Source: Axillary (04/17 1520) BP: 107/58 (04/17 1634) Pulse Rate: 84 (04/17 1634)  Labs: Recent Labs    09/02/17 0407  09/03/17 0355 09/03/17 1449 09/04/17 0445 09/04/17 0923 09/04/17 1542  HGB 10.1*  --  10.0*  --  9.8*  --   --   HCT 29.3*  --  28.4*  --  29.2*  --   --   PLT 148*  --  120*  --  95*  --   --   APTT  --   --   --   --   --  64* 97*  LABPROT  --   --   --   --   --  30.7*  --   INR  --   --   --   --   --  2.98  --   CREATININE 1.32*   < > 1.53* 1.35* 1.29*  --   --    < > = values in this interval not displayed.    Estimated Creatinine Clearance: 29.9 mL/min (A) (by C-G formula based on SCr of 1.29 mg/dL (H)).   Medical History: Past Medical History:  Diagnosis Date  . Arthritis   . Blood transfusion without reported diagnosis   . Cancer [facility])    patient states she had br ca 13 years ago  . ESRD (end stage renal disease) on dialysis ([place])   . Hypertension   . Renal disorder    patient on dialysis  . 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 Patient receiving CRRT and has elevated LFTs, will use bivalirudin with dosing 0.05mg /kg/hr.   Initial PTT = 97 seconds  Goal of Therapy:  aPTT 50-85 secs (will start despite elevated aPTT given new clot for goal closer to 80-85)   Plan:  Decrease bivalirudin to 0.04 mg/kg/hr Repeat aPTT in 2 hours, will check every 2 hours until at goal, then daily per HIT  protocol Monitor CBC and for signs and symptoms of bleeding  Thank you Anette Guarneri, PharmD [phone] 09/04/2017,4:37 PM

## 2017-09-04 NOTE — Progress Notes (Signed)
Patient had bite block in mouth during prior days for intubation. Prior to that insertion, patient bit down on tongue and had ulcerated/bruised areas.   Upon assessment today, patients tongue is blackened in areas of ulceration. WOC consult placed. Receiving Q2 oral care per protocol now.   Will monitor   Lorre Munroe

## 2017-09-04 NOTE — Progress Notes (Signed)
I spoke with Dr. Laurence Ferrari, [place] and Armbruster. She is too sick for ERCP still. The leak is controled with her current drain. GI will return in a few days to see if she has significant clinical improvement prior to considering ERCP.

## 2017-09-04 NOTE — Progress Notes (Signed)
[facility] Progress Note   Subjective:  Remains off pressors, eyes open and responding some   Objective Vitals:   09/04/17 0830 09/04/17 0900 09/04/17 0930 09/04/17 1000  BP: (!) 115/58 (!) 110/54 (!) 102/53 (!) 99/56  Pulse: 76 78 71 74  Resp: 19 17 17  (!) 27  Temp:      TempSrc:      SpO2: 97% 97% 97% 98%  Weight:      Height:       Physical Exam General: eyes open, intubated, nods head slightly to questions Heart:tachycardia, RR, 2/6 systolic murmur. No rub or gallop Lungs:CTAB  Abdomen:soft, NTND, biliary drain in place Extremities: 1- 2+ edema diffusely, 3+ LUE edema Dialysis Access: LUA AVG +b/t , L IJ temp cath running CRRT   Dialysis Orders: [facility] 3h 67mn 400/A1.5 61.5kg 2/2.25 bath P4 AVG LUE Hep 4000 - Hectoral 217m IV q HD - Mircera 3065mIV q 2 weeks (last 3/27)   Assessment/Plan: 1 Acute cholecystitis - s/p lap chole 4/6. Bile duct leak sp IR biliary drain 4/11 2 Acute resp failure/ asp PNA - vdrf, dense infiltrates by CXR remain, on zosyn.  3 Septic shock - improved, off pressors.  4 ESRD - usual HD is MWF.  CRRT started on 4/13. Cont today, get vol down as tolerated, may be able to transtion to IHD soon.  5 Anemia of CKD- Hgb low. Last darbe 150 ug on 4/15.  SP prbc's Hb is up to 10.  6  MBD ckd - low phos d/t CRRT, improved after IV phos 4/14 7 Positive troponin- trend- too unstable for intervention, seen by cardiology 8 VDRF - on vent support, per CCM 9 Volume - sig edema, will ^ UF on CRRT today as BP tolerates   RobKelly Splinter [employer] ([phone]/13/2019, 11:21 AM      Filed Weights   09/03/17 0323 09/03/17 2000 09/04/17 0444  Weight: 64 kg (141 lb 1.5 oz) 65.8 kg (145 lb 1 oz) 65.8 kg (145 lb 1 oz)    Intake/Output Summary (Last 24 hours) at 09/04/2017 1032 Last data filed at 09/04/2017 1000 Gross per 24 hour  Intake 2290 ml  Output 2485 ml  Net -195 ml    Additional  Objective Labs: Basic Metabolic Panel: Recent Labs  Lab 09/03/17 0355 09/03/17 1449 09/04/17 0445  NA 132* 132* 132*  K 3.7 4.0 4.1  CL 99* 98* 100*  CO2 25 25 26   GLUCOSE 112* 109* 102*  BUN 8 8 6   CREATININE 1.53* 1.35* 1.29*  CALCIUM 7.8* 8.0* 8.0*  PHOS 2.9 2.3* 2.3*   Liver Function Tests: Recent Labs  Lab 09/02/17 0407  09/03/17 0355 09/03/17 1449 09/04/17 0445  AST 652*  --  315*  --  157*  ALT 515*  --  337*  --  234*  ALKPHOS 336*  --  442*  --  510*  BILITOT 4.2*  --  4.9*  --  4.6*  PROT 5.7*  --  5.8*  --  5.9*  ALBUMIN 1.8*  1.7*   < > 1.6*  1.7* 1.7* 1.7*   < > = values in this interval not displayed.   No results for input(s): LIPASE, AMYLASE in the last 168 hours. CBC: Recent Labs  Lab 09/01/17 0357 09/01/17 1816 09/02/17 0407 09/03/17 0355 09/04/17 0445  WBC 27.7* 31.0* 32.3* 26.4* 25.2*  HGB 6.0* 9.5* 10.1* 10.0* 9.8*  HCT 17.7* 28.6* 29.3* 28.4*  29.2*  MCV 86.8 85.4 85.9 86.6 86.4  PLT 157 144* 148* 120* 95*   Blood Culture    Component Value Date/Time   SDES ABDOMEN BILE 09/02/2017 1405   SPECREQUEST NONE 09/02/2017 1405   CULT  09/02/2017 1405    NO GROWTH 2 DAYS Performed at [facility], [place] [address], [place], [place] [address]    REPTSTATUS PENDING 09/02/2017 1405    CBG: Recent Labs  Lab 09/03/17 1134 09/03/17 1527 09/03/17 2042 09/03/17 2332 09/04/17 0431  GLUCAP 96 86 60* 103* 81    Lab Results  Component Value Date   INR 1.53 08/28/2017   INR 1.34 04/10/2010   INR 1.13 04/03/2010   Studies/Results:  Medications: . sodium chloride 10 mL/hr at 09/03/17 2300  . sodium chloride    . dextrose 40 mL/hr at 09/04/17 0921  . fentaNYL infusion INTRAVENOUS 75 mcg/hr (09/04/17 0800)  . norepinephrine (LEVOPHED) Adult infusion Stopped (09/03/17 2000)  . piperacillin-tazobactam Stopped (09/04/17 0840)  . dialysis replacement fluid (prismasate) 400 mL/hr at 09/04/17 0318  . dialysis replacement fluid  (prismasate) 400 mL/hr at 09/04/17 0319  . dialysate (PRISMASATE) 1,250 mL/hr at 09/04/17 1013   . aspirin  81 mg Per Tube Daily  . chlorhexidine gluconate (MEDLINE KIT)  15 mL Mouth Rinse BID  . Chlorhexidine Gluconate Cloth  6 each Topical Daily  . darbepoetin (ARANESP) injection - DIALYSIS  150 mcg Intravenous Q Mon-HD  . doxercalciferol  2 mcg Intravenous Q M,W,F-HD  . insulin aspart  0-9 Units Subcutaneous Q4H  . mouth rinse  15 mL Mouth Rinse 10 times per day  . pantoprazole sodium  40 mg Per Tube Daily  . sodium chloride flush  10-40 mL Intracatheter Q12H

## 2017-09-04 NOTE — Progress Notes (Signed)
   09/04/17 0600  Clinical Encounter Type  Visited With Patient;Patient and family together  Visit Type Initial  Referral From Nurse  Consult/Referral To Chaplain  Spiritual Encounters  Spiritual Needs Emotional  Stress Factors  Patient Stress Factors Exhausted  Family Stress Factors Exhausted    Pt coded and large group of medical staff were on-site when I arrived. No family member at the time bur soon after we called them and arrived 30 mins later. Pt was escorted to 2M03 where I followed. Family in emotional distress and left early this morning. Chaplain provided emotional support through reflective listening and compassionate presence.  Pam Fuller a Medical sales representative, [employer]

## 2017-09-04 NOTE — Progress Notes (Signed)
9030 pt placed on PS trial at this time, inc PS to 12 due to decreased VT's <200 & RR >35.  0748 Pt placed back on full support due to increased RR >40, pt tolerating well, RT will monitor

## 2017-09-04 NOTE — Progress Notes (Signed)
Preliminary result--Left upper extremity venous duplex study completed. Positive for IJV partially thrombosed.   Other veins are patent with flow. Result notified to RN Caryl Pina.      Pam Fuller (RDMS RVT)   Pam Fuller (RVT) 09/04/17 9:45 AM

## 2017-09-04 NOTE — Progress Notes (Signed)
[place] for bivalirudin. 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 Erythromycin Nausea And Vomiting  . Vicodin [Hydrocodone-Acetaminophen]     unknown    Patient Measurements: Height: 5\' 2"  (157.5 cm) Weight: 145 lb 1 oz (65.8 kg) IBW/kg (Calculated) : 50.1 Heparin Dosing Weight: 63.58 kg  Vital Signs: Temp: 96.9 F (36.1 C) (04/17 1520) Temp Source: Axillary (04/17 1520) BP: 99/47 (04/17 1830) Pulse Rate: 73 (04/17 1830)  Labs: Recent Labs    09/02/17 0407  09/03/17 0355 09/03/17 1449 09/04/17 0445 09/04/17 0923 09/04/17 1542 09/04/17 1813  HGB 10.1*  --  10.0*  --  9.8*  --   --   --   HCT 29.3*  --  28.4*  --  29.2*  --   --   --   PLT 148*  --  120*  --  95*  --   --   --   APTT  --   --   --   --   --  64* 97* 107*  LABPROT  --   --   --   --   --  30.7*  --   --   INR  --   --   --   --   --  2.98  --   --   CREATININE 1.32*   < > 1.53* 1.35* 1.29*  --  1.32*  --    < > = values in this interval not displayed.    Estimated Creatinine Clearance: 29.3 mL/min (A) (by C-G formula based on SCr of 1.32 mg/dL (H)).   Medical History: Past Medical History:  Diagnosis Date  . Arthritis   . Blood transfusion without reported diagnosis   . Cancer [facility])    patient states she had br ca 13 years ago  . ESRD (end stage renal disease) on dialysis ([place])   . Hypertension   . Renal disorder    patient on dialysis  . 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 Patient receiving CRRT and has elevated LFTs, will use bivalirudin with dosing 0.05mg /kg/hr.   Initial PTT = 97 seconds (rate decreased) Next aPTT = 107  Goal of Therapy:  aPTT 50-85 secs (will start despite elevated aPTT given new clot for goal closer to 80-85)   Plan:  Stop infusion for 1 hr, then decrease bivalirudin to  0.03 mg/kg/hr at 2000 Repeat aPTT in 2 hours, will check every 2 hours until at goal, then daily per HIT protocol Monitor CBC and for signs and symptoms of bleeding  Elenor Quinones, PharmD, BCPS Clinical Pharmacist Pager [phone] 09/04/2017 7:16 PM

## 2017-09-05 ENCOUNTER — Inpatient Hospital Stay (HOSPITAL_COMMUNITY): Payer: Medicare Other

## 2017-09-05 LAB — CBC
HEMATOCRIT: 27.7 % — AB (ref 36.0–46.0)
Hemoglobin: 8.7 g/dL — ABNORMAL LOW (ref 12.0–15.0)
MCH: 28 pg (ref 26.0–34.0)
MCHC: 31.4 g/dL (ref 30.0–36.0)
MCV: 89.1 fL (ref 78.0–100.0)
Platelets: 120 10*3/uL — ABNORMAL LOW (ref 150–400)
RBC: 3.11 MIL/uL — ABNORMAL LOW (ref 3.87–5.11)
RDW: 17.3 % — AB (ref 11.5–15.5)
WBC: 22.1 10*3/uL — ABNORMAL HIGH (ref 4.0–10.5)

## 2017-09-05 LAB — BASIC METABOLIC PANEL
Anion gap: 6 (ref 5–15)
BUN: 6 mg/dL (ref 6–20)
CHLORIDE: 103 mmol/L (ref 101–111)
CO2: 26 mmol/L (ref 22–32)
CREATININE: 1.07 mg/dL — AB (ref 0.44–1.00)
Calcium: 8.2 mg/dL — ABNORMAL LOW (ref 8.9–10.3)
GFR calc Af Amer: 54 mL/min — ABNORMAL LOW (ref >60)
GFR calc non Af Amer: 47 mL/min — ABNORMAL LOW (ref >60)
GLUCOSE: 113 mg/dL — AB (ref 65–99)
Potassium: 4.1 mmol/L (ref 3.5–5.1)
SODIUM: 135 mmol/L (ref 135–145)

## 2017-09-05 LAB — BODY FLUID CULTURE
Culture: NO GROWTH
Gram Stain: NONE SEEN

## 2017-09-05 LAB — MAGNESIUM
MAGNESIUM: 2.5 mg/dL — AB (ref 1.7–2.4)
Magnesium: 2.2 mg/dL (ref 1.7–2.4)

## 2017-09-05 LAB — HEPATIC FUNCTION PANEL
ALBUMIN: 1.6 g/dL — AB (ref 3.5–5.0)
ALK PHOS: 478 U/L — AB (ref 38–126)
ALT: 166 U/L — AB (ref 14–54)
AST: 113 U/L — ABNORMAL HIGH (ref 15–41)
BILIRUBIN INDIRECT: 1.7 mg/dL — AB (ref 0.3–0.9)
BILIRUBIN TOTAL: 4 mg/dL — AB (ref 0.3–1.2)
Bilirubin, Direct: 2.3 mg/dL — ABNORMAL HIGH (ref 0.1–0.5)
Total Protein: 5.7 g/dL — ABNORMAL LOW (ref 6.5–8.1)

## 2017-09-05 LAB — PHOSPHORUS
PHOSPHORUS: 2.2 mg/dL — AB (ref 2.5–4.6)
PHOSPHORUS: 2.2 mg/dL — AB (ref 2.5–4.6)

## 2017-09-05 LAB — GLUCOSE, CAPILLARY
GLUCOSE-CAPILLARY: 72 mg/dL (ref 65–99)
GLUCOSE-CAPILLARY: 115 mg/dL — AB (ref 65–99)
GLUCOSE-CAPILLARY: 101 mg/dL — AB (ref 65–99)
Glucose-Capillary: 104 mg/dL — ABNORMAL HIGH (ref 65–99)
Glucose-Capillary: 120 mg/dL — ABNORMAL HIGH (ref 65–99)

## 2017-09-05 LAB — HEPARIN INDUCED PLATELET AB (HIT ANTIBODY): HEPARIN INDUCED PLT AB: 0.206 {OD_unit} (ref 0.000–0.400)

## 2017-09-05 LAB — APTT
APTT: 82 s — AB (ref 24–36)
APTT: 82 s — AB (ref 24–36)
aPTT: 99 seconds — ABNORMAL HIGH (ref 24–36)
aPTT: 114 seconds — ABNORMAL HIGH (ref 24–36)
aPTT: 93 seconds — ABNORMAL HIGH (ref 24–36)
aPTT: 80 seconds — ABNORMAL HIGH (ref 24–36)

## 2017-09-05 MED ORDER — PIPERACILLIN-TAZOBACTAM 3.375 G IVPB
3.3750 g | Freq: Two times a day (BID) | INTRAVENOUS | Status: AC
  Administered 2017-09-05 – 2017-09-06 (×3): 3.375 g via INTRAVENOUS
  Filled 2017-09-05 (×3): qty 50

## 2017-09-05 MED ORDER — PRO-STAT SUGAR FREE PO LIQD
30.0000 mL | Freq: Two times a day (BID) | ORAL | Status: DC
  Administered 2017-09-05 (×2): 30 mL
  Filled 2017-09-05 (×3): qty 30

## 2017-09-05 MED ORDER — POTASSIUM PHOSPHATE MONOBASIC 500 MG PO TABS: 500.0000 mg | ORAL_TABLET | Freq: Once | ORAL | Status: DC

## 2017-09-05 MED ORDER — MIDODRINE HCL 2.5 MG PO TABS
2.5000 mg | ORAL_TABLET | Freq: Three times a day (TID) | ORAL | Status: DC
  Administered 2017-09-05 – 2017-09-06 (×3): 2.5 mg
  Filled 2017-09-05 (×4): qty 1

## 2017-09-05 MED ORDER — PROMETHAZINE HCL 25 MG/ML IJ SOLN
12.5000 mg | Freq: Once | INTRAMUSCULAR | Status: AC
  Administered 2017-09-05: 12.5 mg via INTRAVENOUS
  Filled 2017-09-05: qty 1

## 2017-09-05 MED ORDER — VITAL HIGH PROTEIN PO LIQD
1000.0000 mL | ORAL | Status: DC
  Administered 2017-09-05: 17:00:00
  Administered 2017-09-05: 1000 mL
  Administered 2017-09-05: 18:00:00

## 2017-09-05 MED ORDER — K PHOS MONO-SOD PHOS DI & MONO 155-852-130 MG PO TABS
500.0000 mg | ORAL_TABLET | Freq: Once | ORAL | Status: AC
  Administered 2017-09-05: 500 mg via ORAL
  Filled 2017-09-05: qty 2

## 2017-09-05 MED ORDER — FENTANYL CITRATE (PF) 100 MCG/2ML IJ SOLN
25.0000 ug | INTRAMUSCULAR | Status: DC | PRN
  Administered 2017-09-05 – 2017-09-06 (×2): 50 ug via INTRAVENOUS
  Administered 2017-09-06: 25 ug via INTRAVENOUS
  Administered 2017-09-06 (×2): 50 ug via INTRAVENOUS
  Administered 2017-09-07 – 2017-09-08 (×2): 25 ug via INTRAVENOUS
  Administered 2017-09-08 – 2017-09-17 (×6): 50 ug via INTRAVENOUS
  Administered 2017-09-17: 25 ug via INTRAVENOUS
  Administered 2017-09-18 – 2017-09-20 (×4): 50 ug via INTRAVENOUS
  Filled 2017-09-05 (×11): qty 2

## 2017-09-05 NOTE — Progress Notes (Signed)
Daily Rounding Note  09/05/2017, 12:08 PM  LOS: 14 days   SUBJECTIVE:   Remains on vent. Hypotension overnight, MD decreased rate of ultrafiltration.  Did not require restart of pressors.     Bile drain output 75>>225 >> 25 mls last 3 days.    Pulmonary contemplating vent wean in next 24 hours.  Off CRRT Got Pheneragan ~ 2 hours ago for nausea and drowsy since.  Prior to that very alert, appropriate, following commands.  No BMs  OBJECTIVE:         Vital signs in last 24 hours:    Temp:  [95.6 F (35.3 C)-97.6 F (36.4 C)] 97.5 F (36.4 C) (04/18 0738) Pulse Rate:  [67-89] 79 (04/18 1000) Resp:  [14-30] 19 (04/18 1000) BP: (74-125)/(30-83) 108/34 (04/18 1000) SpO2:  [92 %-100 %] 99 % (04/18 1000) FiO2 (%):  [30 %] 30 % (04/18 1147) Weight:  [144 lb 2.9 oz (65.4 kg)] 144 lb 2.9 oz (65.4 kg) (04/18 0428) Last BM Date: 09/03/17 Filed Weights   09/03/17 2000 09/04/17 0444 09/05/17 0428  Weight: 145 lb 1 oz (65.8 kg) 145 lb 1 oz (65.8 kg) 144 lb 2.9 oz (65.4 kg)   General: looks ill, remains intubated on vent   Heart: RRR Chest: bil  Ronchi.  No labored breathing on vent.   Abdomen: tender diffusely but no guard or rebound.  Quiet BS.  No mass.  Biloma drain contains at most 10 ml of clear brown bile   Extremities: edema in UE, Left >> right.   Neuro/Psych:  Eyes are open but not following commands or acknowledging me.     Intake/Output from previous day: 04/17 0701 - 04/18 0700 In: 1793.9 [I.V.:1533.9; NG/GT:60; IV Piggyback:200] Out: 2858 [Drains:35]  Intake/Output this shift: Total I/O In: 211.6 [I.V.:161.6; IV Piggyback:50] Out: 224 [Other:224]  Lab Results: Recent Labs    09/03/17 0355 09/04/17 0445 09/05/17 0243  WBC 26.4* 25.2* 22.1*  HGB 10.0* 9.8* 8.7*  HCT 28.4* 29.2* 27.7*  PLT 120* 95* 120*   BMET Recent Labs    09/04/17 0445 09/04/17 1542 09/05/17 0243  NA 132* 134* 135  K 4.1 4.1 4.1   CL 100* 101 103  CO2 _0 GLUCOSE 102* 108* 113*  BUN 6 <5* 6  CREATININE 1.29* 1.32* 1.07*  CALCIUM 8.0* 8.2* 8.2*   LFT Recent Labs    09/03/17 0355  09/04/17 0445 09/04/17 1542 09/05/17 0243  PROT 5.8*  --  5.9*  --  5.7*  ALBUMIN 1.6*  1.7*   < > 1.7* 1.5* 1.6*  AST 315*  --  157*  --  113*  ALT 337*  --  234*  --  166*  ALKPHOS 442*  --  510*  --  478*  BILITOT 4.9*  --  4.6*  --  4.0*  BILIDIR 3.3*  --  2.8*  --  2.3*  IBILI 1.6*  --  1.8*  --  1.7*   < > = values in this interval not displayed.   PT/INR Recent Labs    09/04/17 0923  LABPROT 30.7*  INR 2.98   Hepatitis Panel No results for input(s): HEPBSAG, HCVAB, HEPAIGM, HEPBIGM in the last 72 hours.  Studies/Results: Ct Abdomen Pelvis Wo Contrast  Result Date: 09/03/2017 CLINICAL DATA:  84 year old female with a history of biloma. EXAM: CT ABDOMEN AND PELVIS WITHOUT CONTRAST TECHNIQUE: Multidetector CT imaging of the abdomen and pelvis was performed  following the standard protocol without IV contrast. COMPARISON:  Chest x-ray 09/03/2017, nuclear medicine study 09/01/2017, prior CT 08/26/2017, 08/20/2017 FINDINGS: Lower chest: Nodular airspace opacity of right middle lobe, bilateral lower lobes, similar distribution to prior CT and plain film studies. No pleural effusion. Native coronary calcifications. Hepatobiliary: Liver similar to prior studies. There is trace fluid remaining on the dome of the liver, though significantly decreased in volume from the CT dated 08/26/2017. Pigtail drainage catheter terminates adjacent to segment 6 of the liver. Residual thin fluid within the gallbladder fossa with surgical clips from prior cholecystectomy. Fluid measures 7.2 cm transversely and approximately 17 mm cranial caudal. No intrahepatic or extrahepatic biliary ductal dilatation. Pancreas: Unremarkable pancreas. There is rounded tissue inseparable from the distal pancreatic tail, adjacent to the spleen. The tissue is  similar density of spleen tissue. Overall, this focus of tissue is unchanged dating to 2011, and is most likely accessory splenic tissue/splenule. Spleen: Unremarkable spleen Adrenals/Urinary Tract: Unremarkable adrenal glands. Bilateral kidneys atrophic. Low-density lesions, unchanged over the course of several CT studies dating to 2011. None of these lesions are completely characterized on the current CT. Stomach/Bowel: Enteric contrast within stomach, small bowel, extending to the colon. No extraluminal contrast identified. No transition point. No dilated small bowel or colon. Questionable wall thickening of the length of the colon, poorly characterized with the exclusion of IV contrast. Colonic diverticular change without definite associated inflammatory changes. Gastric tube terminates within the stomach. Vascular/Lymphatic: Calcifications of the abdominal aorta and bilateral iliac arteries. Periaortic lymph nodes, none of which are enlarged. No pelvic adenopathy. No mesenteric adenopathy. Reproductive: Hysterectomy Other: Dependent low-density fluid within the pelvis without evidence of loculation. Volume is unchanged from the comparison CT. Interval resolution of peritoneal air. Stranding of the mesenteric fat associated with small bowel and colon. Stranding of the abdominal wall fat/edema. Musculoskeletal: No acute displaced fracture. Multilevel degenerative changes of the visualized thoracolumbar spine. Degenerative changes of the hips. IMPRESSION: Pigtail drainage catheter at the inferior liver margin, with overall decreased volume of perihepatic fluid in this patient with a history of biloma. There is trace retained fluid in the gallbladder fossa, status post cholecystectomy. Similar volume of left subdiaphragmatic low-density fluid, as well as low-density fluid layered within the dependent pelvis. Neither of these collections appear loculated, potentially representing reactive ascites or additional sites  of bile accumulation, less likely abscess. Questionable colonic wall thickening throughout the length of the colon. This may be seen with anasarca/edema and a positive fluid status, or alternatively could represent inflammatory/infectious colitis. No evidence of bowel obstruction. Multifocal airspace disease of the lung bases, concerning for infection. Mesenteric and body wall anasarca/edema. Aortic Atherosclerosis (ICD10-I70.0). Electronically Signed   By: Corrie Mckusick D.O.   On: 09/03/2017 17:36   Dg Chest Port 1 View  Result Date: 09/05/2017 CLINICAL DATA:  84 year old with endotracheal intubation. EXAM: PORTABLE CHEST 1 VIEW COMPARISON:  09/04/2017 FINDINGS: Endotracheal tube is 4.3 cm above the carina. Nasogastric tube extends into the abdomen but the tip is beyond the image. Patchy airspace disease in the right lung with slightly improved aeration. Persistent patchy densities at the left lung base. Heart size is within normal limits and stable. Atherosclerotic calcifications at the aortic arch. There is a left jugular dialysis catheter and the tip is near the junction of the SVC and left innominate vein. Again noted is a drainage catheter in the right upper abdomen. IMPRESSION: Improved aeration in the right lung with residual airspace disease. Persistent densities at the left  lung base. Dialysis catheter tip is near the junction of the left innominate vein and SVC. Electronically Signed   By: Markus Daft M.D.   On: 09/05/2017 09:09   Dg Chest Port 1 View  Result Date: 09/04/2017 CLINICAL DATA:  Shortness of breath EXAM: PORTABLE CHEST 1 VIEW COMPARISON:  09/03/2017 FINDINGS: Cardiac shadow is stable. Endotracheal tube and nasogastric catheter is well as a left jugular central line are again seen and stable. Diffuse infiltrates are noted bilaterally right greater than left without significant change. No new focal abnormality is noted. IMPRESSION: Stable bilateral infiltrates. Electronically Signed    By: Inez Catalina M.D.   On: 09/04/2017 08:10   Scheduled Meds: . aspirin  81 mg Per Tube Daily  . chlorhexidine gluconate (MEDLINE KIT)  15 mL Mouth Rinse BID  . Chlorhexidine Gluconate Cloth  6 each Topical Daily  . darbepoetin (ARANESP) injection - DIALYSIS  150 mcg Intravenous Q Mon-HD  . doxercalciferol  2 mcg Intravenous Q M,W,F-HD  . hydrogen peroxide 1/2 strength irrigation   Irrigation BID  . insulin aspart  0-9 Units Subcutaneous Q4H  . mouth rinse  15 mL Mouth Rinse 10 times per day  . midodrine  2.5 mg Per Tube TID WC  . pantoprazole sodium  40 mg Per Tube Daily  . sodium chloride flush  10-40 mL Intracatheter Q12H   Continuous Infusions: . sodium chloride 10 mL/hr at 09/05/17 1200  . sodium chloride    . dextrose 40 mL/hr at 09/05/17 1200  . norepinephrine (LEVOPHED) Adult infusion Stopped (09/03/17 2000)  . piperacillin-tazobactam (ZOSYN)  IV     PRN Meds:.acetaminophen **OR** acetaminophen, docusate, fentaNYL (SUBLIMAZE) injection, glucagon (human recombinant), midazolam  ASSESMENT:   *  Post op bile leak.  Drain placed 4/11.   ERCP postoponed due to pt instability.   CT of 4/16 showed decreased perihepatic fluid collection, stable fluid volume in subdiaphragm and pelvis.  Leak controlled with drain in place.   LFTS, WBCs, steadily improving.  No fevers on Zosyn.    *  Sepsis with shock, resp failure/VDRF, + Troponins (too sick for cardiac testing).  HCAP.    *  Partially thrombosed left int jugular vein per today's doppler.    *  Thrombocytopenia. Present for 4 days, well prior to Bivalrudin that started at noon today.    *  Normocytic anemia.  2 U PRBCs given on 4/14.    *  ESRD, pre-existing.  On CRRT currently. Renal planning transition to scheduled HD today/tomorrow.       PLAN   *  No plans for ERCP.  If bile output continues low, should we consider cholangiogram to reassess bile leak and see if leak has sealed over or CT to assess state of biloma?       Azucena Freed  09/05/2017, 12:08 PM Phone [phone]

## 2017-09-05 NOTE — Progress Notes (Signed)
100 mL of fentanyl wasted down sink.  Worthy Keeler, RN witnessed the waste.

## 2017-09-05 NOTE — Progress Notes (Signed)
Patients CRRT stopped as per orders from nephro and CCM.  152 mL of patients blood was returned with no complication.  Pt's HD line was heparin locked with the amount 1.4 cc in each port.

## 2017-09-05 NOTE — Progress Notes (Signed)
Nutrition Follow-up  DOCUMENTATION CODES:   Non-severe (moderate) malnutrition in context of acute illness/injury  INTERVENTION:    If unable to begin enteral nutrition within the next 24 hours, recommend starting TPN as patient has been on clear liquids / NPO since admission, x 14 days.  When able to begin enteral nutrition, recommend: Vital AF 1.2 at 45 ml/h to provide 45 ml/h to provide 1296 kcal, 81 gm protein, 876 ml free water daily.  NUTRITION DIAGNOSIS:   Moderate Malnutrition related to acute illness(cholecystitis, bile leak) as evidenced by energy intake < 75% for > 7 days, mild muscle depletion.  Ongoing  GOAL:   Patient will meet greater than or equal to 90% of their needs  Unmet  MONITOR:   Vent status, Skin, Labs, I & O's  ASSESSMENT:   84 yo female with PMH of HTN, arthritis, ESRD-on HD, and breast cancer who was admitted on 4/3 with acute cholecystitis. S/P lap chole for severe calculous cholecystitis on 4/6. Found to have bile leak. ERCP was aborted 4/11 due to aspiration in endoscopy. S/P biloma drain placement by IR 4/11.  Discussed patient in ICU rounds and with RN today.  CRRT being stopped today. Patient has been without adequate nutrition for the past 14 days since admission. Hopeful for extubation soon.  Patient is currently intubated on ventilator support MV: 11.3 L/min Temp (24hrs), Avg:97 F (36.1 C), Min:95.6 F (35.3 C), Max:97.6 F (36.4 C)  Labs reviewed. Phosphorus 2.2 (L) Medications reviewed.  Diet Order:  Diet NPO time specified  EDUCATION NEEDS:   No education needs have been identified at this time  Skin:  Skin Assessment: Skin Integrity Issues: Skin Integrity Issues:: Incisions Incisions: abdomen, breast  Last BM:  4/16  Height:   Ht Readings from Last 1 Encounters:  09/02/17 5\' 2"  (1.575 m)    Weight:   Wt Readings from Last 1 Encounters:  09/05/17 144 lb 2.9 oz (65.4 kg)    Ideal Body Weight:  50 kg  BMI:   Body mass index is 26.37 kg/m.  Estimated Nutritional Needs:   Kcal:  1350  Protein:  80-95 gm  Fluid:  1.3-1.5 L    Molli Barrows, RD, LDN, [place] Pager [phone] After Hours Pager [phone]

## 2017-09-05 NOTE — Progress Notes (Signed)
ANTICOAGULATION CONSULT NOTE - Follow Up Consult  Pharmacy Consult for bivalirudin Indication: LUE DVT/suspected HIT  Labs: Recent Labs    09/02/17 0407  09/03/17 0355 09/03/17 1449 09/04/17 0445  09/04/17 0923 09/04/17 1542 09/04/17 1813 09/04/17 2216  HGB 10.1*  --  10.0*  --  9.8*  --   --   --   --   --   HCT 29.3*  --  28.4*  --  29.2*  --   --   --   --   --   PLT 148*  --  120*  --  95*  --   --   --   --   --   APTT  --   --   --   --   --    < > 64* 97* 107* 112*  LABPROT  --   --   --   --   --   --  30.7*  --   --   --   INR  --   --   --   --   --   --  2.98  --   --   --   CREATININE 1.32*   < > 1.53* 1.35* 1.29*  --   --  1.32*  --   --    < > = values in this interval not displayed.    Assessment: 84yo female remains above goal on bivalirudin with increased PTT despite decreased rate; RN reports no gtt issues or signs of bleeding.  Goal of Therapy:  aPTT 50-85 seconds   Plan:  Will decrease bivalirudin by 50% to 0.015 mg/kg/hr and check PTT in 2hr.  Wynona Neat, PharmD, BCPS  09/05/2017,12:23 AM

## 2017-09-05 NOTE — Progress Notes (Signed)
ANTICOAGULATION CONSULT NOTE - Follow Up Consult  Pharmacy Consult for bivalirudin Indication: LUE DVT/suspected HIT  Labs: Recent Labs    09/03/17 0355 09/03/17 1449 09/04/17 0445  09/04/17 0923 09/04/17 1542 09/04/17 1813 09/04/17 2216 09/05/17 0243  HGB 10.0*  --  9.8*  --   --   --   --   --   --   HCT 28.4*  --  29.2*  --   --   --   --   --   --   PLT 120*  --  95*  --   --   --   --   --   --   APTT  --   --   --    < > 64* 97* 107* 112* 99*  LABPROT  --   --   --   --  30.7*  --   --   --   --   INR  --   --   --   --  2.98  --   --   --   --   CREATININE 1.53* 1.35* 1.29*  --   --  1.32*  --   --   --    < > = values in this interval not displayed.    Assessment: 84yo female remains above goal on bivalirudin with increased PTT despite decreased rate; RN reports no gtt issues or signs of bleeding.  Goal of Therapy:  aPTT 50-85 seconds   Plan:  Will decrease bivalirudin by 20% to 0.012 mg/kg/hr and check PTT in 2hr.  Excell Seltzer PharmD 09/05/2017,4:33 AM

## 2017-09-05 NOTE — Progress Notes (Signed)
[facility] Progress Note   Subjective:  BP's stable, dropped overnight and UF lowered  Objective Vitals:   09/05/17 0751 09/05/17 0800 09/05/17 0900 09/05/17 1000  BP: (!) 110/34 (!) 122/32 (!) 125/39 (!) 108/34  Pulse: 77 78 89 79  Resp: (!) 22 18 (!) 26 19  Temp:      TempSrc:      SpO2: 99% 99% 97% 99%  Weight:      Height:       Physical Exam General: eyes open, intubated, follows commands Heart:tachycardia, RR, 2/6 systolic murmur. No rub or gallop Lungs:CTAB  Abdomen:soft, NTND, biliary drain in place Extremities: 1+ edema UE's / LE's, 3+ LUE edema Dialysis Access: LUA AVG +b/t , L IJ temp cath running CRRT   Dialysis Orders: MWF [facility] 3h 23mn 400/A1.5 61.5kg 2/2.25 bath P4 AVG LUE Hep 4000 - Hectoral 217m IV q HD - Mircera 3070mIV q 2 weeks (last 3/27)   Assessment/Plan: 1 Acute cholecystitis - s/p lap chole 4/6. Bile duct leak sp IR biliary drain 4/11 2 Acute resp failure/ asp PNA - vdrf, dense infiltrates by CXR remain, on zosyn.  3 Septic shock - improved, off pressors.  4 ESRD - usual HD is MWF.  CRRT started on 4/13, will dc today and transition to regular HD using L arm AVF.  Temp cath can be pulled at discretion of primary team.  5 Anemia of CKD- Hgb low. Last darbe 150 ug on 4/15.  SP prbc's Hb is up to 10.  6  MBD ckd - low phos d/t CRRT, improved after IV phos 4/14 7 Positive troponin- trend- too unstable for intervention, seen by cardiology 8 VDRF - on vent support, per CCM 9 Volume - Up 4-5kg 10 LUE DVT - possibly cath associated w/ temp HD cath in L neck. Per primary.    Pam Fuller Pam Fuller [phone]/13/2019, 11:21 AM      Filed Weights   09/03/17 2000 09/04/17 0444 09/05/17 0428  Weight: 65.8 kg (145 lb 1 oz) 65.8 kg (145 lb 1 oz) 65.4 kg (144 lb 2.9 oz)    Intake/Output Summary (Last 24 hours) at 09/05/2017 1106 Last data filed at 09/05/2017 1000 Gross per 24 hour  Intake  1665.47 ml  Output 2545 ml  Net -879.53 ml    Additional Objective Labs: Basic Metabolic Panel: Recent Labs  Lab 09/04/17 0445 09/04/17 1542 09/05/17 0243  NA 132* 134* 135  K 4.1 4.1 4.1  CL 100* 101 103  CO2 _0 GLUCOSE 102* 108* 113*  BUN 6 <5* 6  CREATININE 1.29* 1.32* 1.07*  CALCIUM 8.0* 8.2* 8.2*  PHOS 2.3* 2.8 2.2*   Liver Function Tests: Recent Labs  Lab 09/03/17 0355  09/04/17 0445 09/04/17 1542 09/05/17 0243  AST 315*  --  157*  --  113*  ALT 337*  --  234*  --  166*  ALKPHOS 442*  --  510*  --  478*  BILITOT 4.9*  --  4.6*  --  4.0*  PROT 5.8*  --  5.9*  --  5.7*  ALBUMIN 1.6*  1.7*   < > 1.7* 1.5* 1.6*   < > = values in this interval not displayed.   No results for input(s): LIPASE, AMYLASE in the last 168 hours. CBC: Recent Labs  Lab 09/01/17 1816 09/02/17 0407 09/03/17 0355 09/04/17 0445 09/05/17 0243  WBC 31.0* 32.3* 26.4* 25.2* 22.1*  HGB  9.5* 10.1* 10.0* 9.8* 8.7*  HCT 28.6* 29.3* 28.4* 29.2* 27.7*  MCV 85.4 85.9 86.6 86.4 89.1  PLT 144* 148* 120* 95* 120*   Blood Culture    Component Value Date/Time   SDES ABDOMEN BILE 09/02/2017 1405   SPECREQUEST NONE 09/02/2017 1405   CULT  09/02/2017 1405    NO GROWTH 2 DAYS Performed at [facility], [place] [address], [place], [place] [address]    REPTSTATUS PENDING 09/02/2017 1405    CBG: Recent Labs  Lab 09/04/17 1515 09/04/17 2007 09/04/17 2329 09/05/17 0424 09/05/17 0740  GLUCAP 74 93 80 104* 72    Lab Results  Component Value Date   INR 2.98 09/04/2017   INR 1.53 08/28/2017   INR 1.34 04/10/2010   Studies/Results:  Medications: . sodium chloride 10 mL/hr at 09/05/17 1000  . sodium chloride    . bivalirudin (ANGIOMAX) infusion 0.5 mg/mL (Non-ACS indications) Stopped (09/05/17 3750)  . dextrose 40 mL/hr at 09/05/17 1000  . norepinephrine (LEVOPHED) Adult infusion Stopped (09/03/17 2000)  . piperacillin-tazobactam Stopped (09/05/17 5107)  . dialysis  replacement fluid (prismasate) 400 mL/hr at 09/05/17 0537  . dialysis replacement fluid (prismasate) 400 mL/hr at 09/05/17 0547  . dialysate (PRISMASATE) 1,250 mL/hr at 09/05/17 0750   . aspirin  81 mg Per Tube Daily  . chlorhexidine gluconate (MEDLINE KIT)  15 mL Mouth Rinse BID  . Chlorhexidine Gluconate Cloth  6 each Topical Daily  . darbepoetin (ARANESP) injection - DIALYSIS  150 mcg Intravenous Q Mon-HD  . doxercalciferol  2 mcg Intravenous Q M,W,F-HD  . hydrogen peroxide 1/2 strength irrigation   Irrigation BID  . insulin aspart  0-9 Units Subcutaneous Q4H  . mouth rinse  15 mL Mouth Rinse 10 times per day  . midodrine  2.5 mg Per Tube TID WC  . pantoprazole sodium  40 mg Per Tube Daily  . sodium chloride flush  10-40 mL Intracatheter Q12H

## 2017-09-05 NOTE — Progress Notes (Signed)
ANTICOAGULATION CONSULT NOTE - Follow Up Consult  Pharmacy Consult for change of bivalirudin to heparin. Indication: LUE DVT  Allergies  Allergen Reactions  . Erythromycin Nausea And Vomiting  . Vicodin [Hydrocodone-Acetaminophen]     unknown    Patient Measurements: Height: 5\' 2"  (157.5 cm) Weight: 144 lb 2.9 oz (65.4 kg) IBW/kg (Calculated) : 50.1 Heparin Dosing Weight: 63.58 kg  Vital Signs: Temp: 97.5 F (36.4 C) (04/18 0738) Temp Source: Oral (04/18 0738) BP: 108/34 (04/18 1000) Pulse Rate: 79 (04/18 1000)  Labs: Recent Labs    09/03/17 0355  09/04/17 0445  09/04/17 0923 09/04/17 1542  09/05/17 0243 09/05/17 0635 09/05/17 1030 09/05/17 1337  HGB 10.0*  --  9.8*  --   --   --   --  8.7*  --   --   --   HCT 28.4*  --  29.2*  --   --   --   --  27.7*  --   --   --   PLT 120*  --  95*  --   --   --   --  120*  --   --   --   APTT  --   --   --    < > 64* 97*   < > 99* 114* 82* 93*  LABPROT  --   --   --   --  30.7*  --   --   --   --   --   --   INR  --   --   --   --  2.98  --   --   --   --   --   --   CREATININE 1.53*   < > 1.29*  --   --  1.32*  --  1.07*  --   --   --    < > = values in this interval not displayed.    Estimated Creatinine Clearance: 36 mL/min (A) (by C-G formula based on SCr of 1.07 mg/dL (H)).  Assessment: 84yo female started on bivalirudin for HIT ruleout and new LUE DVT. Patient aPTT remained elevated despite dose lowering and CRRT has now stopped, so bivalirudin has been held and aPTT level rechecked. HIT antibody returned 0.206 (negative), plan to discontinue bivalirudin and restart heparin once aPTT returns below patient's baseline 64.  Goal of Therapy:  Heparin level 0.3-0.7   Plan:  Discontinue bivalirudin Recheck aPTT at 1800 Follow up heparin levels for dosing Restart heparin once aPTT below patient's baseline of [place], PharmD Candidate 09/05/2017,11:59 AM

## 2017-09-05 NOTE — Care Management Note (Addendum)
Case Management Note  Patient Details  Name: Pam Fuller MRN: [id number] Date of Birth: 31-Jul-1932  Subjective/Objective:  Pt s/p lap chole - during procedure pt aspirated and suffered a MI                  Action/Plan:  PTA from home - pt is a widow, pts sons are support system.  Pt is ESRD on outpt HD.   Pt remains intubated and on CRRT.  CM will continue to follow for discharge needs   Expected Discharge Date:                  Expected Discharge Plan:     In-House Referral:  Clinical Social Work  Discharge planning Services  CM Consult  Post Acute Care Choice:    Choice offered to:     DME Arranged:    DME Agency:     HH Arranged:    [place] Agency:     Status of Service:     If discussed at [employer], dates discussed:    Additional Comments: 09/05/2017 Pt transferred to ICU ventilated on pressors requiring CRRT. CRRT d/c today and pt will resume IHD when appropriate  Maryclare Labrador, RN 09/05/2017, 11:47 AM

## 2017-09-05 NOTE — Progress Notes (Addendum)
PULMONARY / CRITICAL CARE MEDICINE   Name: Pam Fuller MRN: [id number] DOB: 1933-05-03    ADMISSION DATE:  08/25/2017 CONSULTATION DATE:  4/11  REFERRING MD:  Dr. Maudie Mercury  CHIEF COMPLAINT:  Aspiration  BRIEF SUMMARY:   [name] is a 84 year old female with ESRD on MWF HD, HTN, Anemia, and hx of breast cancer who presented to the ED on 4/4 with RLQ abdominal pain. She was found to have acute cholecystitis with a gallstone lodged in the gallbladder neck. CBD measured up to 10 mm. She underwent laparoscopic cholecystectomy on 4/6. She initially did well but on 4/8 was noted to have worsened pain and confusion and thought to have post-op ileus. CT abd/pelvis 4/8 showed small-mod fluid in the abdominal pelvis, read as normal post op changes but could not exclude a bile leak. A hepatobiliary scan was obtained on 4/9 which did suggest a bile leak. Surgery recommended IR drain and GI consult for ERCP. She went to the Endoscopy unit on 4/11 AM for ERCP however during prep with anesthesia she had significant bilious emesis with hypotension requiring intubation and pressor support. ERCP was not attempted. She was subsequently transferred to the medical ICU.   STUDIES:  CT abd/pelv 4/4 >> acute cholecystitis, gallstone in GB neck, CBD 10 mm  CT Head: no acute changes, chronic small vessel ischemic changes  CT abd/pelvis 4/8 >> small-mod fluid in abd/pelvis  Hepatobiliary scan 4/9 >> bile leak  TTE 4/12 >> EF 65-70%, G1DD, narrow LVOT, mild TR, mild pHTN  U/S Abdomen 4/14 >> small fluid near cholecystectomy site  Hepatobiliary scan 4/14 >> Rt subhepatic fluid collection consistent w/ biloma; increased in size from prior  CT abd/pelvis w/ oral contrast 4/16 >> drainage catheter at inferior liver margin with decreased perihepatic fluid; low-density fluid at left subdiaphragmatic area, questionable colonic wall thickening throughout, multifocal airspace disease of lung  bases   CULTURES: Tracheal aspirate 4/11 >> no growth Abd fluid 4/11 >> no growth 5 days Abd fluid 4/15 >> no growth 2 days  ANTIBIOTICS: Zosyn 4/3 >> 4/7 Unasyn 4/11 Zosyn 4/12 >>  LINES/TUBES: ETT 4/11 OGT 4/11 PIV RUE 4/9 RLQ Biliary Drain 4/11 HD Cath [facility] 4/12  SIGNIFICANT EVENTS: 4/4 - admitted w/ acute cholecystitis 4/6 - lap chole 4/9 - bile leak seen on hepatobiliary scan 4/11 - Aspirated during prep for ERCP, intubated and transferred to ICU 4/11 - Rt abd fluid drain placed by IR 4/12- Heparin gtt started for NSTEMI; held after OG bleeding and trop peak 4/13 - worsening liver enzyymes. ESRD - Anuric. CRRT started. On levophed 10mcg and fent 173mcg 4/14 - Hgb 6.0, transfused 2 units PRBCs 4/17 - DVT LUE - started bivalirudin for suspected HIT   SUBJECTIVE/OVERNIGHT/INTERVAL HX Off levophed since 4/16. DVT found in LUE, started bivalirudin 4/17 for suspected HIT. Blood pressures soft overnight, decreased volume removal. She is awake and tracks with eyes intermittently, but follows commands. Nods head yes or no. Denies chest pain, but nods head yes when asked about belly pain.  VITAL SIGNS: BP (!) 108/34   Pulse 79   Temp (!) 97.5 F (36.4 C) (Oral)   Resp 19   Ht 5\' 2"  (1.575 m)   Wt 144 lb 2.9 oz (65.4 kg)   SpO2 99%   BMI 26.37 kg/m   HEMODYNAMICS:    VENTILATOR SETTINGS: Vent Mode: PSV;CPAP FiO2 (%):  [30 %] 30 % Set Rate:  [27 bmp] 27 bmp Vt Set:  [300 mL]  300 mL PEEP:  [5 cmH20] 5 cmH20 Pressure Support:  [12 cmH20] 12 cmH20 Plateau Pressure:  [15 cmH20-18 cmH20] 18 cmH20  INTAKE / OUTPUT: I/O last 3 completed shifts: In: 2593.9 [I.V.:2233.9; NG/GT:60; IV LFYBOFBPZ:025] Out: 8527 [Emesis/NG output:50; Drains:85; POEUM:3536]  PHYSICAL EXAMINATION: General: elderly woman, intubated, awake HEENT: ETT/OGT in place, [facility] HD cath in place Cardiac: RRR, no rubs, murmurs or gallops Pulm: rhonchi anteriorly R > L Abd: Rt abdominal fluid drain in  place Ext: Swollen LUE, LUE AVF w/ palpable thrill Neuro: opens eyes, tracking, following commands (lifts arms up, wiggles toes), nods head yes or no  PULMONARY Recent Labs  Lab 08/30/17 0252 08/31/17 0252 09/01/17 0834 09/01/17 1151 09/02/17 0342 09/03/17 0315  PHART 7.369 7.275* 7.373 7.323* 7.307* 7.354  PCO2ART 53.7* 55.8* 49.3* 52.7* 50.9* 48.8*  PO2ART 89.0 126.0* 161.0* 87.0 97.0 90.3  HCO3 31.0* 26.1 28.7* 27.5 25.4 26.8  TCO2 33* 28 30 29 27   --   O2SAT 96.0 98.0 99.0 96.0 97.0 97.3    CBC Recent Labs  Lab 09/03/17 0355 09/04/17 0445 09/05/17 0243  HGB 10.0* 9.8* 8.7*  HCT 28.4* 29.2* 27.7*  WBC 26.4* 25.2* 22.1*  PLT 120* 95* 120*    COAGULATION Recent Labs  Lab 09/04/17 0923  INR 2.98    CARDIAC   Recent Labs  Lab 09/12/2017 1708 08/30/17 0057 08/30/17 0454 08/30/17 1100 08/31/17 0315  TROPONINI 1.40* 3.17* 4.47* 3.62* 2.86*   No results for input(s): PROBNP in the last 168 hours.   CHEMISTRY Recent Labs  Lab 09/01/17 0357  09/02/17 0407  09/03/17 0355 09/03/17 1449 09/04/17 0445 09/04/17 1542 09/05/17 0243  NA 136   < > 133*   < > 132* 132* 132* 134* 135  K 3.8   < > 3.6   < > 3.7 4.0 4.1 4.1 4.1  CL 102   < > 99*   < > 99* 98* 100* 101 103  CO2 26   < > 26   < > 25 25 26 25 26   GLUCOSE 99   < > 120*   < > 112* 109* 102* 108* 113*  BUN 12   < > 8   < > 8 8 6  <5* 6  CREATININE 1.88*   < > 1.32*   < > 1.53* 1.35* 1.29* 1.32* 1.07*  CALCIUM 8.0*   < > 7.8*   < > 7.8* 8.0* 8.0* 8.2* 8.2*  MG 2.1  --  2.1  --  2.0  --  2.2  --  2.2  PHOS 1.8*   < > 2.0*   < > 2.9 2.3* 2.3* 2.8 2.2*   < > = values in this interval not displayed.   Estimated Creatinine Clearance: 36 mL/min (A) (by C-G formula based on SCr of 1.07 mg/dL (H)).  I/O last 3 completed shifts: In: 2593.9 [I.V.:2233.9; NG/GT:60; IV Piggyback:300] Out: 1443 [Emesis/NG output:50; Drains:85; XVQMG:8676]   Intake/Output Summary (Last 24 hours) at 09/05/2017 1114 Last data  filed at 09/05/2017 1000 Gross per 24 hour  Intake 1665.47 ml  Output 2545 ml  Net -879.53 ml     LIVER Recent Labs  Lab 09/01/17 0357  09/02/17 0407  09/03/17 0355 09/03/17 1449 09/04/17 0445 09/04/17 0923 09/04/17 1542 09/05/17 0243  AST 1,244*  --  652*  --  315*  --  157*  --   --  113*  ALT 689*  --  515*  --  337*  --  234*  --   --  166*  ALKPHOS 260*  --  336*  --  442*  --  510*  --   --  478*  BILITOT 3.2*  --  4.2*  --  4.9*  --  4.6*  --   --  4.0*  PROT 5.2*  --  5.7*  --  5.8*  --  5.9*  --   --  5.7*  ALBUMIN 1.7*  1.7*   < > 1.8*  1.7*   < > 1.6*  1.7* 1.7* 1.7*  --  1.5* 1.6*  INR  --   --   --   --   --   --   --  2.98  --   --    < > = values in this interval not displayed.     INFECTIOUS Recent Labs  Lab 08/30/17 1501 08/31/17 0906 09/01/17 0018  LATICACIDVEN 4.7* 1.1 1.8     ENDOCRINE CBG (last 3)  Recent Labs    09/04/17 2329 09/05/17 0424 09/05/17 0740  GLUCAP 80 104* 72         IMAGING x48h  - image(s) personally visualized  -   highlighted in bold Ct Abdomen Pelvis Wo Contrast  Result Date: 09/03/2017 CLINICAL DATA:  84 year old female with a history of biloma. EXAM: CT ABDOMEN AND PELVIS WITHOUT CONTRAST TECHNIQUE: Multidetector CT imaging of the abdomen and pelvis was performed following the standard protocol without IV contrast. COMPARISON:  Chest x-ray 09/03/2017, nuclear medicine study 09/01/2017, prior CT 08/26/2017, 08/30/2017 FINDINGS: Lower chest: Nodular airspace opacity of right middle lobe, bilateral lower lobes, similar distribution to prior CT and plain film studies. No pleural effusion. Native coronary calcifications. Hepatobiliary: Liver similar to prior studies. There is trace fluid remaining on the dome of the liver, though significantly decreased in volume from the CT dated 08/26/2017. Pigtail drainage catheter terminates adjacent to segment 6 of the liver. Residual thin fluid within the gallbladder fossa with  surgical clips from prior cholecystectomy. Fluid measures 7.2 cm transversely and approximately 17 mm cranial caudal. No intrahepatic or extrahepatic biliary ductal dilatation. Pancreas: Unremarkable pancreas. There is rounded tissue inseparable from the distal pancreatic tail, adjacent to the spleen. The tissue is similar density of spleen tissue. Overall, this focus of tissue is unchanged dating to 2011, and is most likely accessory splenic tissue/splenule. Spleen: Unremarkable spleen Adrenals/Urinary Tract: Unremarkable adrenal glands. Bilateral kidneys atrophic. Low-density lesions, unchanged over the course of several CT studies dating to 2011. None of these lesions are completely characterized on the current CT. Stomach/Bowel: Enteric contrast within stomach, small bowel, extending to the colon. No extraluminal contrast identified. No transition point. No dilated small bowel or colon. Questionable wall thickening of the length of the colon, poorly characterized with the exclusion of IV contrast. Colonic diverticular change without definite associated inflammatory changes. Gastric tube terminates within the stomach. Vascular/Lymphatic: Calcifications of the abdominal aorta and bilateral iliac arteries. Periaortic lymph nodes, none of which are enlarged. No pelvic adenopathy. No mesenteric adenopathy. Reproductive: Hysterectomy Other: Dependent low-density fluid within the pelvis without evidence of loculation. Volume is unchanged from the comparison CT. Interval resolution of peritoneal air. Stranding of the mesenteric fat associated with small bowel and colon. Stranding of the abdominal wall fat/edema. Musculoskeletal: No acute displaced fracture. Multilevel degenerative changes of the visualized thoracolumbar spine. Degenerative changes of the hips. IMPRESSION: Pigtail drainage catheter at the inferior liver margin, with overall decreased volume of perihepatic fluid in this patient with a history of biloma.  There is trace  retained fluid in the gallbladder fossa, status post cholecystectomy. Similar volume of left subdiaphragmatic low-density fluid, as well as low-density fluid layered within the dependent pelvis. Neither of these collections appear loculated, potentially representing reactive ascites or additional sites of bile accumulation, less likely abscess. Questionable colonic wall thickening throughout the length of the colon. This may be seen with anasarca/edema and a positive fluid status, or alternatively could represent inflammatory/infectious colitis. No evidence of bowel obstruction. Multifocal airspace disease of the lung bases, concerning for infection. Mesenteric and body wall anasarca/edema. Aortic Atherosclerosis (ICD10-I70.0). Electronically Signed   By: Corrie Mckusick D.O.   On: 09/03/2017 17:36   Dg Chest Port 1 View  Result Date: 09/05/2017 CLINICAL DATA:  84 year old with endotracheal intubation. EXAM: PORTABLE CHEST 1 VIEW COMPARISON:  09/04/2017 FINDINGS: Endotracheal tube is 4.3 cm above the carina. Nasogastric tube extends into the abdomen but the tip is beyond the image. Patchy airspace disease in the right lung with slightly improved aeration. Persistent patchy densities at the left lung base. Heart size is within normal limits and stable. Atherosclerotic calcifications at the aortic arch. There is a left jugular dialysis catheter and the tip is near the junction of the SVC and left innominate vein. Again noted is a drainage catheter in the right upper abdomen. IMPRESSION: Improved aeration in the right lung with residual airspace disease. Persistent densities at the left lung base. Dialysis catheter tip is near the junction of the left innominate vein and SVC. Electronically Signed   By: Markus Daft M.D.   On: 09/05/2017 09:09   Dg Chest Port 1 View  Result Date: 09/04/2017 CLINICAL DATA:  Shortness of breath EXAM: PORTABLE CHEST 1 VIEW COMPARISON:  09/03/2017 FINDINGS: Cardiac  shadow is stable. Endotracheal tube and nasogastric catheter is well as a left jugular central line are again seen and stable. Diffuse infiltrates are noted bilaterally right greater than left without significant change. No new focal abnormality is noted. IMPRESSION: Stable bilateral infiltrates. Electronically Signed   By: Inez Catalina M.D.   On: 09/04/2017 08:10     DISCUSSION: Ms. Jamesyn Moorefield is a 84 year old female with ESRD on MWF HD, HTN, Anemia, and hx of breast cancer who was admitted 4/4 with acute cholecystitis. S/p lap chole 4/6 complicated by post-op bile leak. Went for ERCP 4/11 however developed bilious emesis during prep, hypotension, and required intubation plus pressor support. Transferred to the ICU on 4/11.  ASSESSMENT / PLAN: PULMONARY A: Acute Respiratory Distress 2/2 Aspiration Requiring Mechanical Ventilation since 4/11 P:   Continue vent support - 6 cc/kg IBW for ARDS Wake up assessment and SBT Monitor CXR  CARDIOVASCULAR A:  #baseline Aortic Atherosclerosis on CXR RBBB - present prior to admission #current Septic Shock/Hypotension Troponin elevation (peak 4.47) 2/2 demand ischemia in setting of hypotensive event Echocardiogram w/ EF 65-70% Prolonged QTc LUE DVT P:  Off pressors since 4/16 ASA 81 mg daily Bivalirudin Add midodrine   RENAL A:   ESRD on MWF HD AVF LUE P:   CRRT stopped per renal, will transition to regular HD via LUE AVF   GASTROINTESTINAL A:   Acute Cholecystitis s/p lap chole 4/6 Bile leak s/p IR Rt abdominal fluid drain 4/11 - biloma by HIDA 4/14 Shock Liver Nutrition SUP P:   Rt abdominal fluid drain in place Surgery and GI following - ERCP when able Tube feeds initiated 4/17 Protonix per tube  HEMATOLOGIC A:   Hx of Breast Cancer - no acute issue Anemia of  Chronic Disease -  S/p 2 units PRBCs 4/14 Leukocytosis Thrombocytopenia LUE (IJV) DVT P:  Monitor Hgb, transfuse for Hgb <7.0 Monitor for  bleeding Check HIT ab, serotonin release assay Bivalirudin for anticoagulation given hepatic dysfunction Temp HD Cath @ [facility] ok for removal per Nephro - will consider removal in next 1-2 days with adequate anticoagulation  INFECTIOUS A:   Septic Shock from Aspiration PNA Bile Leak s/p IR Biliary Drain 4/11 P:   Zosyn 4/12 >>   ENDOCRINE A:   Secondary Hyperparathyroidism Hypoglycemia P:   D5 @ 40 cc/hr  NEUROLOGIC A:   Pain/Agitation P:   RASS goal: 0 to -1 Fentanyl gtt --> change to Fentanyl prn Versed prn   FAMILY  - Updates: Sons updated at bedside 4/17. Family meeting planned 4/18 @ 1 PM.  - Inter-disciplinary family meet or Palliative Care meeting due by:  09/05/17   Zada Finders, MD Internal Medicine PGY-3  09/05/2017 11:14 AM

## 2017-09-05 NOTE — Progress Notes (Signed)
Pharmacy Antibiotic Note  Pam Fuller is a 84 y.o. female admitted on 09/11/2017 with aspiration PNA during anesthesia for ERCP and sepsis.  Pharmacy has been consulted for Zosyn dosing. Pt is ESRD on HD MWF, transitioning from CVVHD back to IHD.  Assessment: CRRT has been stopped and patient will be transitioning back to previous regimen of IHD MWF per renal.   Plan: Change Zosyn to 3.375g IV Q12H over 4 hour infusion starting at 2000 tonight for 3 doses, with stop date entered for tomorrow.  Height: 5\' 2"  (157.5 cm) Weight: 144 lb 2.9 oz (65.4 kg) IBW/kg (Calculated) : 50.1  Temp (24hrs), Avg:96.9 F (36.1 C), Min:95.6 F (35.3 C), Max:97.6 F (36.4 C)  Recent Labs  Lab 08/30/17 0454 08/30/17 1100 08/30/17 1501  08/31/17 0906  09/01/17 0018  09/01/17 1816 09/02/17 0407  09/03/17 0355 09/03/17 1449 09/04/17 0445 09/04/17 1542 09/05/17 0243  WBC 18.5*  --   --    < >  --   --   --    < > 31.0* 32.3*  --  26.4*  --  25.2*  --  22.1*  CREATININE 4.50*  --   --    < >  --    < >  --    < > 1.70* 1.32*   < > 1.53* 1.35* 1.29* 1.32* 1.07*  LATICACIDVEN 2.1* 5.6* 4.7*  --  1.1  --  1.8  --   --   --   --   --   --   --   --   --    < > = values in this interval not displayed.    Estimated Creatinine Clearance: 36 mL/min (A) (by C-G formula based on SCr of 1.07 mg/dL (H)).    Allergies  Allergen Reactions  . Erythromycin Nausea And Vomiting  . Vicodin [Hydrocodone-Acetaminophen]     unknown    Antimicrobials this admission: 4/11 Unasyn x1 4/12 Zosyn >> 4/19  Dose adjustments this admission: 4/18 Zosyn 3.375g IV Q6H >> Q12H over 4 hour infusion  Microbiology results: 4/11 MRSA: negative 4/11 Trach aspirate: negative 4/11 Bile Cx: negative 4/15 Bile Cx: NGTD  Thank you for allowing pharmacy to be a part of this patient's care.  Levonne Lapping, PharmD Candidate 09/05/2017 11:28 AM

## 2017-09-05 NOTE — Progress Notes (Signed)
Patient ID: Pam Fuller, female   DOB: Oct 12, 1932, 84 y.o.   MRN: [id number]    7 Days Post-Op  Subjective: Patient a little hypotensive overnight.  Did not require to be replaced on pressor support.  No other issues.  She is waking up and following commands.  Objective: Vital signs in last 24 hours: Temp:  [95.6 F (35.3 C)-97.6 F (36.4 C)] 97.5 F (36.4 C) (04/18 0738) Pulse Rate:  [67-84] 79 (04/18 0700) Resp:  [14-30] 28 (04/18 0700) BP: (74-122)/(30-83) 108/30 (04/18 0700) SpO2:  [92 %-100 %] 100 % (04/18 0700) FiO2 (%):  [30 %] 30 % (04/18 0400) Weight:  [65.4 kg (144 lb 2.9 oz)] 65.4 kg (144 lb 2.9 oz) (04/18 0428) Last BM Date: 09/03/17  Intake/Output from previous day: 04/17 0701 - 04/18 0700 In: 1793.9 [I.V.:1533.9; NG/GT:60; IV Piggyback:200] Out: 2858 [Drains:35] Intake/Output this shift: No intake/output data recorded.  PE: Heart: regular Abd: soft, tender to palpation as she winces, some BS, all incisions are c/d/i with some steri-strips present at umbilicus, drain with minimal bilious output currently. 35cc noted yesterday  Lab Results:  Recent Labs    09/04/17 0445 09/05/17 0243  WBC 25.2* 22.1*  HGB 9.8* 8.7*  HCT 29.2* 27.7*  PLT 95* 120*   BMET Recent Labs    09/04/17 1542 09/05/17 0243  NA 134* 135  K 4.1 4.1  CL 101 103  CO2 25 26  GLUCOSE 108* 113*  BUN <5* 6  CREATININE 1.32* 1.07*  CALCIUM 8.2* 8.2*   PT/INR Recent Labs    09/04/17 0923  LABPROT 30.7*  INR 2.98   CMP     Component Value Date/Time   NA 135 09/05/2017 0243   NA 142 02/12/2017 1400   K 4.1 09/05/2017 0243   K 3.9 02/12/2017 1400   CL 103 09/05/2017 0243   CO2 26 09/05/2017 0243   CO2 30 (H) 02/12/2017 1400   GLUCOSE 113 (H) 09/05/2017 0243   GLUCOSE 88 02/12/2017 1400   BUN 6 09/05/2017 0243   BUN 16.9 02/12/2017 1400   CREATININE 1.07 (H) 09/05/2017 0243   CREATININE 6.2 (HH) 02/12/2017 1400   CALCIUM 8.2 (L) 09/05/2017 0243   CALCIUM 9.2  02/12/2017 1400   PROT 5.7 (L) 09/05/2017 0243   PROT 6.8 02/12/2017 1400   ALBUMIN 1.6 (L) 09/05/2017 0243   ALBUMIN 3.3 (L) 02/12/2017 1400   AST 113 (H) 09/05/2017 0243   AST 13 02/12/2017 1400   ALT 166 (H) 09/05/2017 0243   ALT 7 02/12/2017 1400   ALKPHOS 478 (H) 09/05/2017 0243   ALKPHOS 75 02/12/2017 1400   BILITOT 4.0 (H) 09/05/2017 0243   BILITOT 0.68 02/12/2017 1400   GFRNONAA 47 (L) 09/05/2017 0243   GFRAA 54 (L) 09/05/2017 0243   Lipase     Component Value Date/Time   LIPASE 36 08/28/2017 1618       Studies/Results: Ct Abdomen Pelvis Wo Contrast  Result Date: 09/03/2017 CLINICAL DATA:  84 year old female with a history of biloma. EXAM: CT ABDOMEN AND PELVIS WITHOUT CONTRAST TECHNIQUE: Multidetector CT imaging of the abdomen and pelvis was performed following the standard protocol without IV contrast. COMPARISON:  Chest x-ray 09/03/2017, nuclear medicine study 09/01/2017, prior CT 08/26/2017, 08/26/2017 FINDINGS: Lower chest: Nodular airspace opacity of right middle lobe, bilateral lower lobes, similar distribution to prior CT and plain film studies. No pleural effusion. Native coronary calcifications. Hepatobiliary: Liver similar to prior studies. There is trace fluid remaining on the dome of  the liver, though significantly decreased in volume from the CT dated 08/26/2017. Pigtail drainage catheter terminates adjacent to segment 6 of the liver. Residual thin fluid within the gallbladder fossa with surgical clips from prior cholecystectomy. Fluid measures 7.2 cm transversely and approximately 17 mm cranial caudal. No intrahepatic or extrahepatic biliary ductal dilatation. Pancreas: Unremarkable pancreas. There is rounded tissue inseparable from the distal pancreatic tail, adjacent to the spleen. The tissue is similar density of spleen tissue. Overall, this focus of tissue is unchanged dating to 2011, and is most likely accessory splenic tissue/splenule. Spleen: Unremarkable  spleen Adrenals/Urinary Tract: Unremarkable adrenal glands. Bilateral kidneys atrophic. Low-density lesions, unchanged over the course of several CT studies dating to 2011. None of these lesions are completely characterized on the current CT. Stomach/Bowel: Enteric contrast within stomach, small bowel, extending to the colon. No extraluminal contrast identified. No transition point. No dilated small bowel or colon. Questionable wall thickening of the length of the colon, poorly characterized with the exclusion of IV contrast. Colonic diverticular change without definite associated inflammatory changes. Gastric tube terminates within the stomach. Vascular/Lymphatic: Calcifications of the abdominal aorta and bilateral iliac arteries. Periaortic lymph nodes, none of which are enlarged. No pelvic adenopathy. No mesenteric adenopathy. Reproductive: Hysterectomy Other: Dependent low-density fluid within the pelvis without evidence of loculation. Volume is unchanged from the comparison CT. Interval resolution of peritoneal air. Stranding of the mesenteric fat associated with small bowel and colon. Stranding of the abdominal wall fat/edema. Musculoskeletal: No acute displaced fracture. Multilevel degenerative changes of the visualized thoracolumbar spine. Degenerative changes of the hips. IMPRESSION: Pigtail drainage catheter at the inferior liver margin, with overall decreased volume of perihepatic fluid in this patient with a history of biloma. There is trace retained fluid in the gallbladder fossa, status post cholecystectomy. Similar volume of left subdiaphragmatic low-density fluid, as well as low-density fluid layered within the dependent pelvis. Neither of these collections appear loculated, potentially representing reactive ascites or additional sites of bile accumulation, less likely abscess. Questionable colonic wall thickening throughout the length of the colon. This may be seen with anasarca/edema and a positive  fluid status, or alternatively could represent inflammatory/infectious colitis. No evidence of bowel obstruction. Multifocal airspace disease of the lung bases, concerning for infection. Mesenteric and body wall anasarca/edema. Aortic Atherosclerosis (ICD10-I70.0). Electronically Signed   By: Corrie Mckusick D.O.   On: 09/03/2017 17:36   Dg Chest Port 1 View  Result Date: 09/04/2017 CLINICAL DATA:  Shortness of breath EXAM: PORTABLE CHEST 1 VIEW COMPARISON:  09/03/2017 FINDINGS: Cardiac shadow is stable. Endotracheal tube and nasogastric catheter is well as a left jugular central line are again seen and stable. Diffuse infiltrates are noted bilaterally right greater than left without significant change. No new focal abnormality is noted. IMPRESSION: Stable bilateral infiltrates. Electronically Signed   By: Inez Catalina M.D.   On: 09/04/2017 08:10    Anti-infectives: Anti-infectives (From admission, onward)   Start     Dose/Rate Route Frequency Ordered Stop   08/31/17 0800  piperacillin-tazobactam (ZOSYN) IVPB 3.375 g     3.375 g 100 mL/hr over 30 Minutes Intravenous Every 6 hours 08/31/17 0739     08/30/17 2000  Ampicillin-Sulbactam (UNASYN) 3 g in sodium chloride 0.9 % 100 mL IVPB  Status:  Discontinued     3 g 200 mL/hr over 30 Minutes Intravenous Every 24 hours 09/02/2017 0953 08/30/17 0945   08/30/17 1230  piperacillin-tazobactam (ZOSYN) IVPB 3.375 g  Status:  Discontinued     3.375  g 12.5 mL/hr over 240 Minutes Intravenous Every 12 hours 08/30/17 1134 08/31/17 0738   09/02/2017 1030  Ampicillin-Sulbactam (UNASYN) 3 g in sodium chloride 0.9 % 100 mL IVPB     3 g 200 mL/hr over 30 Minutes Intravenous  Once 09/13/2017 0953 08/19/2017 1246   09/06/2017 0000  ampicillin-sulbactam (UNASYN) 1.5 g in sodium chloride 0.9 % 100 mL IVPB  Status:  Discontinued     1.5 g 200 mL/hr over 30 Minutes Intravenous Once 08/28/17 1045 08/28/2017 0928   08/28/2017 2200  piperacillin-tazobactam (ZOSYN) IVPB 3.375 g    Note  to Pharmacy:  Zosyn 3.375 g IV q12h in ESRD on HD   3.375 g 12.5 mL/hr over 240 Minutes Intravenous Every 12 hours 09/16/2017 1155 08/25/17 1335   08/22/17 1000  piperacillin-tazobactam (ZOSYN) IVPB 3.375 g  Status:  Discontinued    Note to Pharmacy:  Zosyn 3.375 g IV q12h in ESRD on HD   3.375 g 12.5 mL/hr over 240 Minutes Intravenous Every 12 hours 08/22/17 0128 08/31/2017 1155   08/22/17 0045  piperacillin-tazobactam (ZOSYN) IVPB 3.375 g     3.375 g 12.5 mL/hr over 240 Minutes Intravenous  Once 08/22/17 0038 08/22/17 0145       Assessment/Plan HTN ESRD - HD MWF H/o breast cancer  Acute cholecystitis POD13s/p lap chole4/6/19 by Dr. Redmond Pulling - CT scan 4/8 w/ small amount fluid around liver and layering in pelvis ; HIDA 4/9 positive for bile leak  -drain placed for biloma.  -cont this management for now.  GI will re-evaluate next week to determine stability for stent placement -LFTs significantly improving  VDRF secondary to aspiration/PNA -CCMfollowing - appreciate assistance.   Leukocytosis Likely secondary to aspiration PNA and bile leak (which is fairly well controlled with her currently drain)  On Zosyn. WBC down to 22 today  Elevated troponins Felt to possibly be demand ischemia and less likely to be true MI  ID -zosyn 4/4>>4/7,Unasyn 4/11-->4/12, Zosyn 4/12 --> FEN -NPO/OGT - RN states TFs are likely starting today VTE -SCDs Foley -none, anuric  Follow up -Dr. Redmond Pulling  Plan-  Cont with drain care for now for leak.  GI to reassess next week for stent placement.     LOS: 14 days    Henreitta Cea , [facility] Surgery 09/05/2017, 7:46 AM Pager: [phone]

## 2017-09-06 LAB — HEPATIC FUNCTION PANEL
ALBUMIN: 1.5 g/dL — AB (ref 3.5–5.0)
ALT: 112 U/L — AB (ref 14–54)
AST: 68 U/L — AB (ref 15–41)
Alkaline Phosphatase: 458 U/L — ABNORMAL HIGH (ref 38–126)
BILIRUBIN DIRECT: 1.8 mg/dL — AB (ref 0.1–0.5)
BILIRUBIN TOTAL: 3.5 mg/dL — AB (ref 0.3–1.2)
Indirect Bilirubin: 1.7 mg/dL — ABNORMAL HIGH (ref 0.3–0.9)
Total Protein: 5.5 g/dL — ABNORMAL LOW (ref 6.5–8.1)

## 2017-09-06 LAB — CBC
HCT: 24 % — ABNORMAL LOW (ref 36.0–46.0)
HCT: 22.8 % — ABNORMAL LOW (ref 36.0–46.0)
HEMOGLOBIN: 7.7 g/dL — AB (ref 12.0–15.0)
HEMOGLOBIN: 7.5 g/dL — AB (ref 12.0–15.0)
MCH: 28.5 pg (ref 26.0–34.0)
MCH: 29 pg (ref 26.0–34.0)
MCHC: 32.1 g/dL (ref 30.0–36.0)
MCHC: 32.9 g/dL (ref 30.0–36.0)
MCV: 88.9 fL (ref 78.0–100.0)
MCV: 88 fL (ref 78.0–100.0)
Platelets: 165 10*3/uL (ref 150–400)
Platelets: 197 10*3/uL (ref 150–400)
RBC: 2.7 MIL/uL — AB (ref 3.87–5.11)
RBC: 2.59 MIL/uL — ABNORMAL LOW (ref 3.87–5.11)
RDW: 17.2 % — ABNORMAL HIGH (ref 11.5–15.5)
RDW: 16.9 % — ABNORMAL HIGH (ref 11.5–15.5)
WBC: 23.9 10*3/uL — AB (ref 4.0–10.5)
WBC: 27.9 10*3/uL — ABNORMAL HIGH (ref 4.0–10.5)

## 2017-09-06 LAB — GLUCOSE, CAPILLARY
GLUCOSE-CAPILLARY: 129 mg/dL — AB (ref 65–99)
GLUCOSE-CAPILLARY: 105 mg/dL — AB (ref 65–99)
Glucose-Capillary: 110 mg/dL — ABNORMAL HIGH (ref 65–99)
Glucose-Capillary: 113 mg/dL — ABNORMAL HIGH (ref 65–99)
Glucose-Capillary: 124 mg/dL — ABNORMAL HIGH (ref 65–99)
Glucose-Capillary: 130 mg/dL — ABNORMAL HIGH (ref 65–99)
Glucose-Capillary: 85 mg/dL (ref 65–99)

## 2017-09-06 LAB — SEROTONIN RELEASE ASSAY (SRA)
SRA, HIGH DOSE HEPARIN: 15 % (ref 0–20)
SRA, LOW DOSE HEPARIN: 1 % (ref 0–20)

## 2017-09-06 LAB — BASIC METABOLIC PANEL
Anion gap: 9 (ref 5–15)
BUN: 17 mg/dL (ref 6–20)
CALCIUM: 8.6 mg/dL — AB (ref 8.9–10.3)
CHLORIDE: 102 mmol/L (ref 101–111)
CO2: 24 mmol/L (ref 22–32)
CREATININE: 2.33 mg/dL — AB (ref 0.44–1.00)
GFR calc Af Amer: 21 mL/min — ABNORMAL LOW (ref >60)
GFR calc non Af Amer: 18 mL/min — ABNORMAL LOW (ref >60)
GLUCOSE: 94 mg/dL (ref 65–99)
Potassium: 3.5 mmol/L (ref 3.5–5.1)
Sodium: 135 mmol/L (ref 135–145)

## 2017-09-06 LAB — MAGNESIUM
MAGNESIUM: 1.9 mg/dL (ref 1.7–2.4)
Magnesium: 2.3 mg/dL (ref 1.7–2.4)

## 2017-09-06 LAB — APTT
aPTT: 77 seconds — ABNORMAL HIGH (ref 24–36)
aPTT: 82 seconds — ABNORMAL HIGH (ref 24–36)

## 2017-09-06 LAB — HEPARIN LEVEL (UNFRACTIONATED): HEPARIN UNFRACTIONATED: 0.47 [IU]/mL (ref 0.30–0.70)

## 2017-09-06 LAB — PHOSPHORUS
PHOSPHORUS: 4.3 mg/dL (ref 2.5–4.6)
Phosphorus: 2.2 mg/dL — ABNORMAL LOW (ref 2.5–4.6)

## 2017-09-06 MED ORDER — HYDROGEN PEROXIDE 3 % EX SOLN
Freq: Two times a day (BID) | CUTANEOUS | Status: DC
  Administered 2017-09-07 (×2)
  Filled 2017-09-06: qty 500

## 2017-09-06 MED ORDER — POTASSIUM PHOSPHATES 15 MMOLE/5ML IV SOLN
30.0000 mmol | Freq: Once | INTRAVENOUS | Status: AC
  Administered 2017-09-06: 30 mmol via INTRAVENOUS
  Filled 2017-09-06: qty 10

## 2017-09-06 MED ORDER — VITAL AF 1.2 CAL PO LIQD
1000.0000 mL | ORAL | Status: DC
  Administered 2017-09-06: 1000 mL

## 2017-09-06 MED ORDER — HEPARIN (PORCINE) IN NACL 100-0.45 UNIT/ML-% IJ SOLN
1000.0000 [IU]/h | INTRAMUSCULAR | Status: DC
  Administered 2017-09-06: 1000 [IU]/h via INTRAVENOUS
  Filled 2017-09-06 (×3): qty 250

## 2017-09-06 MED ORDER — MIDODRINE HCL 5 MG PO TABS
5.0000 mg | ORAL_TABLET | Freq: Three times a day (TID) | ORAL | Status: DC
  Administered 2017-09-06 – 2017-09-07 (×2): 5 mg
  Filled 2017-09-06 (×4): qty 1

## 2017-09-06 NOTE — Progress Notes (Addendum)
Nutrition Follow-up  DOCUMENTATION CODES:   Non-severe (moderate) malnutrition in context of acute illness/injury  INTERVENTION:    Resume TF with Vital AF 1.2 at 10 ml/h over night, if she tolerates trickle TF okay, recommend slowly increase as tolerated by 10 ml every 8 hours to goal rate of 45 ml/h to provide 1296 kcal, 81 gm protein, 876 ml free water daily.  Patient may require TPN to help meet nutrition needs if she does not tolerate TF advancement.  Monitor magnesium, potassium, and phosphorus daily for at least 3 days, MD to replete as needed, as pt is at risk for refeeding syndrome given moderate PCM with minimal intake for 2 weeks.  NUTRITION DIAGNOSIS:   Moderate Malnutrition related to acute illness(cholecystitis, bile leak) as evidenced by energy intake < 75% for > 7 days, mild muscle depletion.  Ongoing  GOAL:   Patient will meet greater than or equal to 90% of their needs  Unmet  MONITOR:   Vent status, Skin, Labs, I & O's  ASSESSMENT:   84 yo female with PMH of HTN, arthritis, ESRD-on HD, and breast cancer who was admitted on 4/3 with acute cholecystitis. S/P lap chole for severe calculous cholecystitis on 4/6. Found to have bile leak. ERCP was aborted 4/11 due to aspiration in endoscopy. S/P biloma drain placement by IR 4/11.  Discussed patient in ICU rounds and with RN today. Hopeful for extubation soon.  Patient has transitioned to IHD. TF was initiated yesterday, currently on hold due to nausea and vomiting. She has an ileus per review of Surgery notes.  Patient has been essentially NPO since admission. She meets criteria for moderate malnutrition.    Patient is currently intubated on ventilator support MV: 9.4 L/min Temp (24hrs), Avg:98.6 F (37 C), Min:97.8 F (36.6 C), Max:99.1 F (37.3 C)  Labs reviewed. Phosphorus 2.2 (L) CBG's: 130-85-113-129 Medications reviewed and include potassium phosphate.  Diet Order:  Diet NPO time  specified  EDUCATION NEEDS:   No education needs have been identified at this time  Skin:  Skin Assessment: Skin Integrity Issues: Skin Integrity Issues:: Incisions Incisions: abdomen, breast  Last BM:  4/19 (type 7)  Height:   Ht Readings from Last 1 Encounters:  09/02/17 5\' 2"  (1.575 m)    Weight:   Wt Readings from Last 1 Encounters:  09/06/17 141 lb 8.6 oz (64.2 kg)    Ideal Body Weight:  50 kg  BMI:  Body mass index is 25.89 kg/m.  Estimated Nutritional Needs:   Kcal:  1350  Protein:  80-95 gm  Fluid:  1.3-1.5 L    Molli Barrows, RD, LDN, [place] Pager [phone] After Hours Pager [phone]

## 2017-09-06 NOTE — Progress Notes (Signed)
ANTICOAGULATION CONSULT NOTE - Follow Up Consult  Pharmacy Consult for heparin. Indication: LUE DVT  Allergies  Allergen Reactions  . Erythromycin Nausea And Vomiting  . Vicodin [Hydrocodone-Acetaminophen]     unknown    Patient Measurements: Height: 5\' 2"  (157.5 cm) Weight: 142 lb 10.2 oz (64.7 kg) IBW/kg (Calculated) : 50.1 Heparin Dosing Weight: 63.58 kg  Vital Signs: Temp: 98.8 F (37.1 C) (04/19 0349) Temp Source: Oral (04/19 0349) BP: 97/51 (04/19 0730) Pulse Rate: 94 (04/19 0730)  Labs: Recent Labs    09/04/17 0445  09/04/17 0923 09/04/17 1542  09/05/17 0243  09/05/17 2004 09/06/17 0011 09/06/17 0515  HGB 9.8*  --   --   --   --  8.7*  --   --   --  7.7*  HCT 29.2*  --   --   --   --  27.7*  --   --   --  24.0*  PLT 95*  --   --   --   --  120*  --   --   --  165  APTT  --    < > 64* 97*   < > 99*   < > 82* 82* 77*  LABPROT  --   --  30.7*  --   --   --   --   --   --   --   INR  --   --  2.98  --   --   --   --   --   --   --   CREATININE 1.29*  --   --  1.32*  --  1.07*  --   --   --  2.33*   < > = values in this interval not displayed.    Estimated Creatinine Clearance: 16.4 mL/min (A) (by C-G formula based on SCr of 2.33 mg/dL (H)).  Assessment: 84yo female with elevated aPTT post bivalirudin, which was stopped on 4/18, HIT antibody negative. Patient's aPTT at 77 this morning, nurse reported small amount of blood in stool and Hgb 8.7 > 7.7, but do not want to further delay anticoagulation given DVT. Will start heparin now per MD and closely monitor Hgb Q12H.  Goal of Therapy:  Heparin level 0.3-0.7 units/ml Monitor platelets by anticoagulation protocol: Yes   Plan:  Start heparin infusion at 1000 units/hr  Heparin level in 8 hours  Levonne Lapping, PharmD Candidate 09/06/2017,7:53 AM

## 2017-09-06 NOTE — Progress Notes (Signed)
ANTICOAGULATION CONSULT NOTE - Follow Up Consult  Pharmacy Consult for heparin Indication: DVT  Labs: Recent Labs    09/03/17 0355  09/04/17 0445  09/04/17 0923 09/04/17 1542  09/05/17 0243  09/05/17 1753 09/05/17 2004 09/06/17 0011  HGB 10.0*  --  9.8*  --   --   --   --  8.7*  --   --   --   --   HCT 28.4*  --  29.2*  --   --   --   --  27.7*  --   --   --   --   PLT 120*  --  95*  --   --   --   --  120*  --   --   --   --   APTT  --   --   --    < > 64* 97*   < > 99*   < > 80* 82* 82*  LABPROT  --   --   --   --  30.7*  --   --   --   --   --   --   --   INR  --   --   --   --  2.98  --   --   --   --   --   --   --   CREATININE 1.53*   < > 1.29*  --   --  1.32*  --  1.07*  --   --   --   --    < > = values in this interval not displayed.    Assessment/Plan:  aPTT remains elevated.  Will hold off on heparin and recheck PTT with am labs.  Wynona Neat, PharmD, BCPS  09/06/2017,12:42 AM

## 2017-09-06 NOTE — Progress Notes (Addendum)
ANTICOAGULATION CONSULT NOTE - Follow Up Consult  Pharmacy Consult for heparin. Indication: LUE DVT  Allergies  Allergen Reactions  . Erythromycin Nausea And Vomiting  . Vicodin [Hydrocodone-Acetaminophen]     unknown    Patient Measurements: Height: 5\' 2"  (157.5 cm) Weight: 141 lb 8.6 oz (64.2 kg) IBW/kg (Calculated) : 50.1 Heparin Dosing Weight: 63.58 kg  Vital Signs: Temp: 98.5 F (36.9 C) (04/19 1539) Temp Source: Oral (04/19 1539) BP: 123/51 (04/19 1600) Pulse Rate: 98 (04/19 1600)  Labs: Recent Labs    09/04/17 0923 09/04/17 1542  09/05/17 0243  09/05/17 2004 09/06/17 0011 09/06/17 0515 09/06/17 1619  HGB  --   --   --  8.7*  --   --   --  7.7* 7.5*  HCT  --   --   --  27.7*  --   --   --  24.0* 22.8*  PLT  --   --   --  120*  --   --   --  165 197  APTT 64* 97*   < > 99*   < > 82* 82* 77*  --   LABPROT 30.7*  --   --   --   --   --   --   --   --   INR 2.98  --   --   --   --   --   --   --   --   HEPARINUNFRC  --   --   --   --   --   --   --   --  0.47  CREATININE  --  1.32*  --  1.07*  --   --   --  2.33*  --    < > = values in this interval not displayed.    Estimated Creatinine Clearance: 16.4 mL/min (A) (by C-G formula based on SCr of 2.33 mg/dL (H)).  Assessment: 84yo female with elevated aPTT post bivalirudin, which was stopped on 4/18, HIT antibody negative. Patient's aPTT was 77 this morning, nurse reported small amount of blood in stool and Hgb 8.7 > 7.7>7.5, anticoagulation started as not wanting to further delay anticoagulation given DVT.   Initial heparin level at goal at 0.47. Will recheck CBC and heparin level in am.   Goal of Therapy:  Heparin level 0.3-0.5 units/ml Monitor platelets by anticoagulation protocol: Yes   Plan:  Continue heparin infusion at 1000 units/hr  Labs in am  [name] Hearing PharmD., BCPS Clinical Pharmacist 09/06/2017 6:03 PM

## 2017-09-06 NOTE — Progress Notes (Addendum)
PULMONARY / CRITICAL CARE MEDICINE   Name: KYLANI WIRES MRN: [id number] DOB: Nov 07, 1932    ADMISSION DATE:  09/16/2017 CONSULTATION DATE:  4/11  REFERRING MD:  Dr. Maudie Mercury  CHIEF COMPLAINT:  Aspiration  BRIEF SUMMARY:   Ms. Rayshell Goecke is a 84 year old female with ESRD on MWF HD, HTN, Anemia, and hx of breast cancer who presented to the ED on 4/4 with RLQ abdominal pain. She was found to have acute cholecystitis with a gallstone lodged in the gallbladder neck. CBD measured up to 10 mm. She underwent laparoscopic cholecystectomy on 4/6. She initially did well but on 4/8 was noted to have worsened pain and confusion and thought to have post-op ileus. CT abd/pelvis 4/8 showed small-mod fluid in the abdominal pelvis, read as normal post op changes but could not exclude a bile leak. A hepatobiliary scan was obtained on 4/9 which did suggest a bile leak. Surgery recommended IR drain and GI consult for ERCP. She went to the Endoscopy unit on 4/11 AM for ERCP however during prep with anesthesia she had significant bilious emesis with hypotension requiring intubation and pressor support. ERCP was not attempted. She was subsequently transferred to the medical ICU.   STUDIES:  CT abd/pelv 4/4 >> acute cholecystitis, gallstone in GB neck, CBD 10 mm  CT Head: no acute changes, chronic small vessel ischemic changes  CT abd/pelvis 4/8 >> small-mod fluid in abd/pelvis  Hepatobiliary scan 4/9 >> bile leak  TTE 4/12 >> EF 65-70%, G1DD, narrow LVOT, mild TR, mild pHTN  U/S Abdomen 4/14 >> small fluid near cholecystectomy site  Hepatobiliary scan 4/14 >> Rt subhepatic fluid collection consistent w/ biloma; increased in size from prior  CT abd/pelvis w/ oral contrast 4/16 >> drainage catheter at inferior liver margin with decreased perihepatic fluid; low-density fluid at left subdiaphragmatic area, questionable colonic wall thickening throughout, multifocal airspace disease of lung  bases   CULTURES: Tracheal aspirate 4/11 >> no growth Abd fluid 4/11 >> no growth 5 days Abd fluid 4/15 >> no growth 3 days  ANTIBIOTICS: Zosyn 4/3 >> 4/7 Unasyn 4/11 Zosyn 4/12 >>  LINES/TUBES: ETT 4/11 OGT 4/11 PIV RUE 4/9 RLQ Biliary Drain 4/11 HD Cath [facility] 4/12  SIGNIFICANT EVENTS: 4/4 - admitted w/ acute cholecystitis 4/6 - lap chole 4/9 - bile leak seen on hepatobiliary scan 4/11 - Aspirated during prep for ERCP, intubated and transferred to ICU 4/11 - Rt abd fluid drain placed by IR 4/12- Heparin gtt started for NSTEMI; held after OG bleeding and trop peak 4/13 - worsening liver enzyymes. ESRD - Anuric. CRRT started. On levophed 62mcg and fent 145mcg 4/14 - Hgb 6.0, transfused 2 units PRBCs 4/17 - DVT LUE - started bivalirudin for suspected HIT 4/18 - CVVH d/c'ed, return to intermittent HD. HIT ab negative - changed bival to heparin   SUBJECTIVE/OVERNIGHT/INTERVAL HX Small amount of blood in stool reported overnight. Stayed off pressors. On HD this morning via left IJ temp HD cath rather than LUE AVF (per son request). Tube feeds held due to nausea.  VITAL SIGNS: BP (!) 81/51   Pulse 99   Temp 98.7 F (37.1 C) (Oral)   Resp (!) 29   Ht 5\' 2"  (1.575 m)   Wt 142 lb 10.2 oz (64.7 kg)   SpO2 98%   BMI 26.09 kg/m   HEMODYNAMICS:    VENTILATOR SETTINGS: Vent Mode: PRVC FiO2 (%):  [30 %] 30 % Set Rate:  [27 bmp] 27 bmp Vt Set:  [300  mL] 300 mL PEEP:  [5 cmH20] 5 cmH20 Pressure Support:  [12 cmH20] 12 cmH20 Plateau Pressure:  [12 cmH20-19 cmH20] 12 cmH20  INTAKE / OUTPUT: I/O last 3 completed shifts: In: 2810.5 [I.V.:1926.8; Other:80; NG/GT:603.8; IV Piggyback:200] Out: 1870 [Emesis/NG output:100; Drains:55; IHKVQ:2595; Stool:1]  PHYSICAL EXAMINATION: General: elderly woman, intubated, awakens to voice HEENT: ETT/OGT in place, LIJ HD cath in place Cardiac: RRR, no rubs, murmurs or gallops Pulm: rhonchi anteriorly bilaterally Abd: Rt abdominal fluid  drain in place Ext: Swollen LUE, LUE AVF w/ palpable thrill Neuro: opens eyes, tracking, following commands (lifts arms up, wiggles toes), nods head yes or no  PULMONARY Recent Labs  Lab 08/31/17 0252 09/01/17 0834 09/01/17 1151 09/02/17 0342 09/03/17 0315  PHART 7.275* 7.373 7.323* 7.307* 7.354  PCO2ART 55.8* 49.3* 52.7* 50.9* 48.8*  PO2ART 126.0* 161.0* 87.0 97.0 90.3  HCO3 26.1 28.7* 27.5 25.4 26.8  TCO2 28 30 29 27   --   O2SAT 98.0 99.0 96.0 97.0 97.3    CBC Recent Labs  Lab 09/04/17 0445 09/05/17 0243 09/06/17 0515  HGB 9.8* 8.7* 7.7*  HCT 29.2* 27.7* 24.0*  WBC 25.2* 22.1* 23.9*  PLT 95* 120* 165    COAGULATION Recent Labs  Lab 09/04/17 0923  INR 2.98    CARDIAC   Recent Labs  Lab 08/30/17 1100 08/31/17 0315  TROPONINI 3.62* 2.86*   No results for input(s): PROBNP in the last 168 hours.   CHEMISTRY Recent Labs  Lab 09/03/17 0355 09/03/17 1449 09/04/17 0445 09/04/17 1542 09/05/17 0243 09/05/17 1707 09/06/17 0515  NA 132* 132* 132* 134* 135  --  135  K 3.7 4.0 4.1 4.1 4.1  --  3.5  CL 99* 98* 100* 101 103  --  102  CO2 25 25 26 25 26   --  24  GLUCOSE 112* 109* 102* 108* 113*  --  94  BUN 8 8 6  <5* 6  --  17  CREATININE 1.53* 1.35* 1.29* 1.32* 1.07*  --  2.33*  CALCIUM 7.8* 8.0* 8.0* 8.2* 8.2*  --  8.6*  MG 2.0  --  2.2  --  2.2 2.5* 2.3  PHOS 2.9 2.3* 2.3* 2.8 2.2* 2.2* 2.2*   Estimated Creatinine Clearance: 16.4 mL/min (A) (by C-G formula based on SCr of 2.33 mg/dL (H)).  I/O last 3 completed shifts: In: 2810.5 [I.V.:1926.8; Other:80; NG/GT:603.8; IV Piggyback:200] Out: 1870 [Emesis/NG output:100; Drains:55; Other:1714; Stool:1]   Intake/Output Summary (Last 24 hours) at 09/06/2017 0922 Last data filed at 09/06/2017 0600 Gross per 24 hour  Intake 1783.75 ml  Output 371 ml  Net 1412.75 ml     LIVER Recent Labs  Lab 09/02/17 0407  09/03/17 0355 09/03/17 1449 09/04/17 0445 09/04/17 0923 09/04/17 1542 09/05/17 0243  09/06/17 0515  AST 652*  --  315*  --  157*  --   --  113* 68*  ALT 515*  --  337*  --  234*  --   --  166* 112*  ALKPHOS 336*  --  442*  --  510*  --   --  478* 458*  BILITOT 4.2*  --  4.9*  --  4.6*  --   --  4.0* 3.5*  PROT 5.7*  --  5.8*  --  5.9*  --   --  5.7* 5.5*  ALBUMIN 1.8*  1.7*   < > 1.6*  1.7* 1.7* 1.7*  --  1.5* 1.6* 1.5*  INR  --   --   --   --   --  2.98  --   --   --    < > = values in this interval not displayed.     INFECTIOUS Recent Labs  Lab 08/30/17 1501 08/31/17 0906 09/01/17 0018  LATICACIDVEN 4.7* 1.1 1.8     ENDOCRINE CBG (last 3)  Recent Labs    09/06/17 0040 09/06/17 0344 09/06/17 0753  GLUCAP 130* 85 113*     IMAGING x48h  - image(s) personally visualized  -   highlighted in bold Dg Chest Port 1 View  Result Date: 09/05/2017 CLINICAL DATA:  84 year old with endotracheal intubation. EXAM: PORTABLE CHEST 1 VIEW COMPARISON:  09/04/2017 FINDINGS: Endotracheal tube is 4.3 cm above the carina. Nasogastric tube extends into the abdomen but the tip is beyond the image. Patchy airspace disease in the right lung with slightly improved aeration. Persistent patchy densities at the left lung base. Heart size is within normal limits and stable. Atherosclerotic calcifications at the aortic arch. There is a left jugular dialysis catheter and the tip is near the junction of the SVC and left innominate vein. Again noted is a drainage catheter in the right upper abdomen. IMPRESSION: Improved aeration in the right lung with residual airspace disease. Persistent densities at the left lung base. Dialysis catheter tip is near the junction of the left innominate vein and SVC. Electronically Signed   By: Markus Daft M.D.   On: 09/05/2017 09:09     DISCUSSION: Ms. Darby Fleeman is a 84 year old female with ESRD on MWF HD, HTN, Anemia, and hx of breast cancer who was admitted 4/4 with acute cholecystitis. S/p lap chole 4/6 complicated by post-op bile leak. Went for  ERCP 4/11 however developed bilious emesis during prep, hypotension, and required intubation plus pressor support. Transferred to the ICU on 4/11.  ASSESSMENT / PLAN: PULMONARY A: Acute Respiratory Distress 2/2 Aspiration Requiring Mechanical Ventilation since 4/11 P:   Continue vent support - change to 8 cc/kg IBW; stop ARDS protocol Wake up assessment and SBT - try again after HD CXR in AM  CARDIOVASCULAR A:  #baseline Aortic Atherosclerosis on CXR RBBB - present prior to admission #current Septic Shock/Hypotension Troponin elevation (peak 4.47) 2/2 demand ischemia in setting of hypotensive event Echocardiogram w/ EF 65-70% Prolonged QTc LUE DVT P:  Off pressors since 4/16 ASA 81 mg daily Heparin gtt for LUE DVT Cont midodrine - increased to 5 mg TID   RENAL A:   ESRD on MWF HD AVF LUE P:   Intermittent HD via LUE AVF per renal  GASTROINTESTINAL A:   Acute Cholecystitis s/p lap chole 4/6 Bile leak s/p IR Rt abdominal fluid drain 4/11 - biloma by HIDA 4/14 Shock Liver Nutrition SUP P:   Rt abdominal fluid drain in place Surgery and GI following - ERCP when able Trickle Tube feeds  Protonix per tube  HEMATOLOGIC A:   Hx of Breast Cancer - no acute issue Anemia of Chronic Disease -  S/p 2 units PRBCs 4/14 Leukocytosis Thrombocytopenia LUE (IJV) DVT Blood in stool overnight P:  Monitor Hgb, transfuse for Hgb <7.0 Monitor for bleeding HIT ab negative - stop Bivalirudin, start Heparin for anticoagulation per pharmacy Temp HD Cath @ [place] ok for removal per Nephro - will remove  INFECTIOUS A:   Septic Shock from Aspiration PNA Bile Leak s/p IR Biliary Drain 4/11 P:   Zosyn 4/12 >> 4/19  ENDOCRINE A:   Secondary Hyperparathyroidism Hypoglycemia P:   D5 @ 40 cc/hr  NEUROLOGIC A:  Pain/Agitation P:   RASS goal: 0 to -1 Fentanyl prn Versed prn   FAMILY  - Updates: Sons updated at bedside 4/17. Will continue to update, plan to meet with sons  today.   Zada Finders, MD Internal Medicine PGY-3  09/06/2017 9:22 AM

## 2017-09-06 NOTE — Progress Notes (Signed)
Pt had BM, notice blood in stool. Pt's hemoglobin 7.7 previous hemoglobin 8.7. Informed Dr Charlynn Grimes, no new orders given at this time.

## 2017-09-06 NOTE — Progress Notes (Signed)
[facility] Progress Note   Subjective:  On HD now, not doing well w/ weaning trials, get very tachypneic per RN.  CXR now change, slowly resolving R> L dense infiltrates.   Objective Vitals:   09/06/17 0915 09/06/17 0930 09/06/17 0945 09/06/17 1000  BP: (!) 81/51 (!) 86/53 (!) 77/35 (!) 79/48  Pulse: 99 94 95 87  Resp: (!) 29 (!) 40 (!) 41 (!) 43  Temp:      TempSrc:      SpO2: 98% 100% 100% 98%  Weight:      Height:       Physical Exam General: eyes open, intubated, follows commands Heart:tachycardia, RR, 2/6 systolic murmur. No rub or gallop Lungs:CTAB  Abdomen:soft, NTND, biliary drain in place Extremities: 1+ edema UE's / LE's, 3+ LUE edema Dialysis Access: LUA AVG +b/t , L IJ temp cath running CRRT   Dialysis Orders: MWF [facility] 3h 16mn 400/A1.5 61.5kg 2/2.25 bath P4 AVG LUE Hep 4000 - Hectoral 218m IV q HD - Mircera 3068mIV q 2 weeks (last 3/27)   Assessment/Plan: 1 Acute cholecystitis - s/p lap chole 4/6. Bile duct leak sp IR biliary drain 4/11 2 Acute resp failure/ asp PNA - vdrf, dense infiltrates by CXR remain, on zosyn.  3 Septic shock - improved, off pressors.  4 ESRD - usual HD is MWF.  CRRT 4/13 > 4/18.  Regular HD today using L arm AVF.  Temp cath can be pulled at discretion of primary team.  5 Anemia of CKD- Hgb low. Last darbe 150 ug on 4/15.  SP prbc's Hb is up to 10.  6  MBD ckd - stable 7 VDRF - on vent support, per CCM 8 Volume - Up 3- 4kg , not tolerating UF on HD which is not surprising. No edema on CXRs.  9 LUE DVT - acute L IJ DVT, prob cath-associated w/ temp HD cath at same site. Per primary.    RobKelly Splinter [employer] ([id number] [id number]/13/2019, 11:21 AM      Filed Weights   09/05/17 0428 09/06/17 0234 09/06/17 0700  Weight: 65.4 kg (144 lb 2.9 oz) 64.8 kg (142 lb 13.7 oz) 64.7 kg (142 lb 10.2 oz)    Intake/Output Summary (Last 24 hours) at 09/06/2017 1029 Last data filed at 09/06/2017  0600 Gross per 24 hour  Intake 1733.75 ml  Output 321 ml  Net 1412.75 ml    Additional Objective Labs: Basic Metabolic Panel: Recent Labs  Lab 09/04/17 1542 09/05/17 0243 09/05/17 1707 09/06/17 0515  NA 134* 135  --  135  K 4.1 4.1  --  3.5  CL 101 103  --  102  CO2 25 26  --  24  GLUCOSE 108* 113*  --  94  BUN <5* 6  --  17  CREATININE 1.32* 1.07*  --  2.33*  CALCIUM 8.2* 8.2*  --  8.6*  PHOS 2.8 2.2* 2.2* 2.2*   Liver Function Tests: Recent Labs  Lab 09/04/17 0445 09/04/17 1542 09/05/17 0243 09/06/17 0515  AST 157*  --  113* 68*  ALT 234*  --  166* 112*  ALKPHOS 510*  --  478* 458*  BILITOT 4.6*  --  4.0* 3.5*  PROT 5.9*  --  5.7* 5.5*  ALBUMIN 1.7* 1.5* 1.6* 1.5*   No results for input(s): LIPASE, AMYLASE in the last 168 hours. CBC: Recent Labs  Lab 09/02/17 0407 09/03/17 0355 09/04/17 0445 09/05/17 0243 09/06/17  0515  WBC 32.3* 26.4* 25.2* 22.1* 23.9*  HGB 10.1* 10.0* 9.8* 8.7* 7.7*  HCT 29.3* 28.4* 29.2* 27.7* 24.0*  MCV 85.9 86.6 86.4 89.1 88.9  PLT 148* 120* 95* 120* 165   Blood Culture    Component Value Date/Time   SDES ABDOMEN BILE 09/02/2017 1405   SPECREQUEST NONE 09/02/2017 1405   CULT  09/02/2017 1405    NO GROWTH 3 DAYS Performed at [facility], [place] [address] [place], [place] [address]    REPTSTATUS 09/05/2017 FINAL 09/02/2017 1405    CBG: Recent Labs  Lab 09/05/17 1534 09/05/17 1938 09/06/17 0040 09/06/17 0344 09/06/17 0753  GLUCAP 101* 120* 130* 85 113*    Lab Results  Component Value Date   INR 2.98 09/04/2017   INR 1.53 08/28/2017   INR 1.34 04/10/2010   Studies/Results:  Medications: . sodium chloride 10 mL/hr at 09/05/17 1800  . sodium chloride    . dextrose 40 mL/hr at 09/05/17 1800  . heparin 1,000 Units/hr (09/06/17 0901)  . piperacillin-tazobactam (ZOSYN)  IV Stopped (09/05/17 2334)  . potassium PHOSPHATE IVPB (mmol) 30 mmol (09/06/17 0937)   . aspirin  81 mg Per Tube Daily  .  chlorhexidine gluconate (MEDLINE KIT)  15 mL Mouth Rinse BID  . Chlorhexidine Gluconate Cloth  6 each Topical Daily  . darbepoetin (ARANESP) injection - DIALYSIS  150 mcg Intravenous Q Mon-HD  . doxercalciferol  2 mcg Intravenous Q M,W,F-HD  . feeding supplement (PRO-STAT SUGAR FREE 64)  30 mL Per Tube BID  . feeding supplement (VITAL HIGH PROTEIN)  1,000 mL Per Tube Q24H  . hydrogen peroxide 1/2 strength irrigation   Irrigation BID  . insulin aspart  0-9 Units Subcutaneous Q4H  . mouth rinse  15 mL Mouth Rinse 10 times per day  . midodrine  5 mg Per Tube TID WC  . pantoprazole sodium  40 mg Per Tube Daily  . sodium chloride flush  10-40 mL Intracatheter Q12H

## 2017-09-06 NOTE — Progress Notes (Signed)
Progress Note: General Surgery Service   Assessment/Plan: Patient Active Problem List   Diagnosis Date Noted  . Shock liver   . Anastomotic leak of biliary tree   . Malnutrition of moderate degree 08/30/2017  . Septic shock ([place])   . NSTEMI (non-ST elevated myocardial infarction) ([place])   . Acute respiratory failure with hypoxia ([place])   . Aspiration into airway   . ESRD on hemodialysis ([place])   . Bile leak   . Acute cholecystitis 08/22/2017  . Accelerated hypertension   . Hyperkalemia   . Syncope 09/14/2015  . Malignant neoplasm of upper-outer quadrant of left breast in female, estrogen receptor positive ([place]) 04/07/2013  . ESRD on dialysis ([place]) 04/07/2013  . Essential hypertension 04/07/2013  . Gout 04/07/2013  . GERD (gastroesophageal reflux disease) 04/07/2013   s/p lap chole -transitioned from CVHD to I HD -starting anticoagulation due to catheter associated LUE DVT -ileus, continue to monitor -continue drain   LOS: 15 days  Chief Complaint/Subjective: Off pressors, transitioned to intermittent dialysis  Objective: Vital signs in last 24 hours: Temp:  [97.1 F (36.2 C)-99.1 F (37.3 C)] 98.7 F (37.1 C) (04/19 0754) Pulse Rate:  [75-103] 95 (04/19 0815) Resp:  [17-43] 39 (04/19 0815) BP: (85-135)/(34-60) 95/49 (04/19 0815) SpO2:  [92 %-100 %] 97 % (04/19 0815) FiO2 (%):  [30 %] 30 % (04/19 0814) Weight:  [64.7 kg (142 lb 10.2 oz)-64.8 kg (142 lb 13.7 oz)] 64.7 kg (142 lb 10.2 oz) (04/19 0700) Last BM Date: 09/05/17  Intake/Output from previous day: 04/18 0701 - 04/19 0700 In: 1945.4 [I.V.:1161.6; NG/GT:603.8; IV Piggyback:100] Out: 545 [Emesis/NG output:100; Drains:20; Stool:1] Intake/Output this shift: No intake/output data recorded.  Lungs: coarse, assisted  Cardiovascular: reg rate and rhythm  Abd: soft, right drain with bilious with slightly bloody drainage  Neuro: assisted ventilation, minimal response  Lab Results: CBC  Recent Labs   09/05/17 0243 09/06/17 0515  WBC 22.1* 23.9*  HGB 8.7* 7.7*  HCT 27.7* 24.0*  PLT 120* 165   BMET Recent Labs    09/05/17 0243 09/06/17 0515  NA 135 135  K 4.1 3.5  CL 103 102  CO2 26 24  GLUCOSE 113* 94  BUN 6 17  CREATININE 1.07* 2.33*  CALCIUM 8.2* 8.6*   PT/INR Recent Labs    09/04/17 0923  LABPROT 30.7*  INR 2.98   ABG No results for input(s): PHART, HCO3 in the last 72 hours.  Invalid input(s): PCO2, PO2  Studies/Results:  Anti-infectives: Anti-infectives (From admission, onward)   Start     Dose/Rate Route Frequency Ordered Stop   09/05/17 2000  piperacillin-tazobactam (ZOSYN) IVPB 3.375 g     3.375 g 12.5 mL/hr over 240 Minutes Intravenous Every 12 hours 09/05/17 1126 09/07/17 0959   08/31/17 0800  piperacillin-tazobactam (ZOSYN) IVPB 3.375 g  Status:  Discontinued     3.375 g 100 mL/hr over 30 Minutes Intravenous Every 6 hours 08/31/17 0739 09/05/17 1126   08/30/17 2000  Ampicillin-Sulbactam (UNASYN) 3 g in sodium chloride 0.9 % 100 mL IVPB  Status:  Discontinued     3 g 200 mL/hr over 30 Minutes Intravenous Every 24 hours 09/04/2017 0953 08/30/17 0945   08/30/17 1230  piperacillin-tazobactam (ZOSYN) IVPB 3.375 g  Status:  Discontinued     3.375 g 12.5 mL/hr over 240 Minutes Intravenous Every 12 hours 08/30/17 1134 08/31/17 0738   09/12/2017 1030  Ampicillin-Sulbactam (UNASYN) 3 g in sodium chloride 0.9 % 100 mL IVPB  3 g 200 mL/hr over 30 Minutes Intravenous  Once 09/04/2017 0953 09/12/2017 1246   08/19/2017 0000  ampicillin-sulbactam (UNASYN) 1.5 g in sodium chloride 0.9 % 100 mL IVPB  Status:  Discontinued     1.5 g 200 mL/hr over 30 Minutes Intravenous Once 08/28/17 1045 09/17/2017 0928   09/09/2017 2200  piperacillin-tazobactam (ZOSYN) IVPB 3.375 g    Note to Pharmacy:  Zosyn 3.375 g IV q12h in ESRD on HD   3.375 g 12.5 mL/hr over 240 Minutes Intravenous Every 12 hours 08/30/2017 1155 08/25/17 1335   08/22/17 1000  piperacillin-tazobactam (ZOSYN) IVPB  3.375 g  Status:  Discontinued    Note to Pharmacy:  Zosyn 3.375 g IV q12h in ESRD on HD   3.375 g 12.5 mL/hr over 240 Minutes Intravenous Every 12 hours 08/22/17 0128 09/07/2017 1155   08/22/17 0045  piperacillin-tazobactam (ZOSYN) IVPB 3.375 g     3.375 g 12.5 mL/hr over 240 Minutes Intravenous  Once 08/22/17 0038 08/22/17 0145      Medications: Scheduled Meds: . aspirin  81 mg Per Tube Daily  . chlorhexidine gluconate (MEDLINE KIT)  15 mL Mouth Rinse BID  . Chlorhexidine Gluconate Cloth  6 each Topical Daily  . darbepoetin (ARANESP) injection - DIALYSIS  150 mcg Intravenous Q Mon-HD  . doxercalciferol  2 mcg Intravenous Q M,W,F-HD  . feeding supplement (PRO-STAT SUGAR FREE 64)  30 mL Per Tube BID  . feeding supplement (VITAL HIGH PROTEIN)  1,000 mL Per Tube Q24H  . hydrogen peroxide 1/2 strength irrigation   Irrigation BID  . insulin aspart  0-9 Units Subcutaneous Q4H  . mouth rinse  15 mL Mouth Rinse 10 times per day  . midodrine  2.5 mg Per Tube TID WC  . pantoprazole sodium  40 mg Per Tube Daily  . sodium chloride flush  10-40 mL Intracatheter Q12H   Continuous Infusions: . sodium chloride 10 mL/hr at 09/05/17 1800  . sodium chloride    . dextrose 40 mL/hr at 09/05/17 1800  . heparin    . norepinephrine (LEVOPHED) Adult infusion Stopped (09/03/17 2000)  . piperacillin-tazobactam (ZOSYN)  IV Stopped (09/05/17 2334)   PRN Meds:.acetaminophen **OR** acetaminophen, docusate, fentaNYL (SUBLIMAZE) injection, midazolam  Mickeal Skinner, MD Pg# [phone] [facility]

## 2017-09-07 ENCOUNTER — Inpatient Hospital Stay (HOSPITAL_COMMUNITY): Payer: Medicare Other

## 2017-09-07 LAB — BASIC METABOLIC PANEL
Anion gap: 19 — ABNORMAL HIGH (ref 5–15)
BUN: 23 mg/dL — AB (ref 6–20)
CALCIUM: 8.4 mg/dL — AB (ref 8.9–10.3)
CHLORIDE: 93 mmol/L — AB (ref 101–111)
CO2: 20 mmol/L — AB (ref 22–32)
Creatinine, Ser: 3.18 mg/dL — ABNORMAL HIGH (ref 0.44–1.00)
GFR calc Af Amer: 15 mL/min — ABNORMAL LOW (ref >60)
GFR calc non Af Amer: 13 mL/min — ABNORMAL LOW (ref >60)
Glucose, Bld: 103 mg/dL — ABNORMAL HIGH (ref 65–99)
Potassium: 3.9 mmol/L (ref 3.5–5.1)
Sodium: 132 mmol/L — ABNORMAL LOW (ref 135–145)

## 2017-09-07 LAB — HEPARIN LEVEL (UNFRACTIONATED)
HEPARIN UNFRACTIONATED: 0.67 [IU]/mL (ref 0.30–0.70)
Heparin Unfractionated: <0.1 IU/mL — ABNORMAL LOW (ref 0.30–0.70)

## 2017-09-07 LAB — CBC
HCT: 20.9 % — ABNORMAL LOW (ref 36.0–46.0)
HCT: 19.6 % — ABNORMAL LOW (ref 36.0–46.0)
HEMOGLOBIN: 6.5 g/dL — AB (ref 12.0–15.0)
Hemoglobin: 7 g/dL — ABNORMAL LOW (ref 12.0–15.0)
MCH: 29 pg (ref 26.0–34.0)
MCH: 29.4 pg (ref 26.0–34.0)
MCHC: 33.5 g/dL (ref 30.0–36.0)
MCHC: 33.2 g/dL (ref 30.0–36.0)
MCV: 86.7 fL (ref 78.0–100.0)
MCV: 88.7 fL (ref 78.0–100.0)
PLATELETS: 192 10*3/uL (ref 150–400)
Platelets: 226 10*3/uL (ref 150–400)
RBC: 2.41 MIL/uL — AB (ref 3.87–5.11)
RBC: 2.21 MIL/uL — AB (ref 3.87–5.11)
RDW: 17.2 % — ABNORMAL HIGH (ref 11.5–15.5)
RDW: 17.9 % — AB (ref 11.5–15.5)
WBC: 26 10*3/uL — ABNORMAL HIGH (ref 4.0–10.5)
WBC: 21.1 10*3/uL — AB (ref 4.0–10.5)

## 2017-09-07 LAB — PREPARE RBC (CROSSMATCH)

## 2017-09-07 LAB — RENAL FUNCTION PANEL
ALBUMIN: 1.5 g/dL — AB (ref 3.5–5.0)
ANION GAP: 12 (ref 5–15)
BUN: 12 mg/dL (ref 6–20)
CO2: 26 mmol/L (ref 22–32)
CREATININE: 2.11 mg/dL — AB (ref 0.44–1.00)
Calcium: 8 mg/dL — ABNORMAL LOW (ref 8.9–10.3)
Chloride: 94 mmol/L — ABNORMAL LOW (ref 101–111)
GFR, EST AFRICAN AMERICAN: 24 mL/min — AB (ref >60)
GFR, EST NON AFRICAN AMERICAN: 21 mL/min — AB (ref >60)
Glucose, Bld: 129 mg/dL — ABNORMAL HIGH (ref 65–99)
PHOSPHORUS: 3.7 mg/dL (ref 2.5–4.6)
Potassium: 2.9 mmol/L — ABNORMAL LOW (ref 3.5–5.1)
SODIUM: 132 mmol/L — AB (ref 135–145)

## 2017-09-07 LAB — GLUCOSE, CAPILLARY
Glucose-Capillary: 109 mg/dL — ABNORMAL HIGH (ref 65–99)
Glucose-Capillary: 125 mg/dL — ABNORMAL HIGH (ref 65–99)
Glucose-Capillary: 111 mg/dL — ABNORMAL HIGH (ref 65–99)
Glucose-Capillary: 74 mg/dL (ref 65–99)

## 2017-09-07 LAB — PHOSPHORUS: PHOSPHORUS: 4.1 mg/dL (ref 2.5–4.6)

## 2017-09-07 LAB — MAGNESIUM
MAGNESIUM: 1.8 mg/dL (ref 1.7–2.4)
Magnesium: 2.5 mg/dL — ABNORMAL HIGH (ref 1.7–2.4)

## 2017-09-07 MED ORDER — POTASSIUM CHLORIDE 20 MEQ/15ML (10%) PO SOLN
20.0000 meq | Freq: Once | ORAL | Status: AC
  Administered 2017-09-07: 20 meq
  Filled 2017-09-07: qty 15

## 2017-09-07 MED ORDER — SODIUM CHLORIDE 0.9 % IV SOLN
Freq: Once | INTRAVENOUS | Status: DC
  Administered 2017-09-07: 22:00:00 via INTRAVENOUS

## 2017-09-07 MED ORDER — ALBUTEROL SULFATE (2.5 MG/3ML) 0.083% IN NEBU: 2.5000 mg | INHALATION_SOLUTION | Freq: Four times a day (QID) | RESPIRATORY_TRACT | Status: DC | PRN

## 2017-09-07 MED ORDER — MIDODRINE HCL 5 MG PO TABS
10.0000 mg | ORAL_TABLET | Freq: Three times a day (TID) | ORAL | Status: DC
  Administered 2017-09-07 – 2017-09-10 (×8): 10 mg
  Filled 2017-09-07 (×11): qty 2

## 2017-09-07 MED ORDER — PROMETHAZINE HCL 25 MG/ML IJ SOLN
12.5000 mg | Freq: Four times a day (QID) | INTRAMUSCULAR | Status: DC | PRN
  Administered 2017-09-07: 12.5 mg via INTRAVENOUS
  Filled 2017-09-07: qty 1

## 2017-09-07 MED ORDER — MAGNESIUM SULFATE 2 GM/50ML IV SOLN
2.0000 g | Freq: Once | INTRAVENOUS | Status: AC
  Administered 2017-09-07: 2 g via INTRAVENOUS
  Filled 2017-09-07: qty 50

## 2017-09-07 MED ORDER — PROMETHAZINE HCL 12.5 MG RE SUPP
12.5000 mg | Freq: Four times a day (QID) | RECTAL | Status: DC | PRN
  Filled 2017-09-07: qty 1

## 2017-09-07 MED ORDER — POTASSIUM CHLORIDE 20 MEQ/15ML (10%) PO SOLN
40.0000 meq | ORAL | Status: AC
  Administered 2017-09-07 (×2): 40 meq
  Filled 2017-09-07 (×2): qty 30

## 2017-09-07 NOTE — Progress Notes (Addendum)
ANTICOAGULATION CONSULT NOTE - Follow Up Consult  Pharmacy Consult for heparin. Indication: LUE DVT  Allergies  Allergen Reactions  . Erythromycin Nausea And Vomiting  . Vicodin [Hydrocodone-Acetaminophen]     unknown   Patient Measurements: Height: 5\' 2"  (157.5 cm) Weight: 140 lb 3.4 oz (63.6 kg) IBW/kg (Calculated) : 50.1 Heparin Dosing Weight: 63.58 kg  Vital Signs: Temp: 98 F (36.7 C) (04/20 1929) Temp Source: Oral (04/20 1929) BP: 102/78 (04/20 1900) Pulse Rate: 98 (04/20 1945)  Labs: Recent Labs    09/05/17 2004 09/06/17 0011 09/06/17 0515 09/06/17 1619 09/07/17 0205 09/07/17 0225 09/07/17 0720 09/07/17 1848 09/07/17 1850  HGB  --   --  7.7* 7.5*  --  7.0*  --   --   --   HCT  --   --  24.0* 22.8*  --  20.9*  --   --   --   PLT  --   --  165 197  --  192  --   --   --   APTT 82* 82* 77*  --   --   --   --   --   --   HEPARINUNFRC  --   --   --  0.47  --   --  <0.10*  --  0.67  CREATININE  --   --  2.33*  --  2.11*  --   --  3.18*  --    Estimated Creatinine Clearance: 12 mL/min (A) (by C-G formula based on SCr of 3.18 mg/dL (H)).  Assessment: 84yo female s/p bivalirudin, which was stopped on 4/18 after HIT antibody negative. Patient's Hgb continues to trend downward 8.7 > 7.7>7.5>7.0. The morning RN reported a small amount of blood from biliary drain and yesterday there was report of blood streaks in her stool. Discussed these findings and the Hgb trend with morning provider. He wished to continue anticoagulation and asked me to order a repeat CBC. +  This evening the patients family refused to let phlebotomy collect scheduled labs. There was a brief delay and labs were rescheduled. The heparin level returned above the conservative goal. HgB 6.5 down further. The on call provider has been contacted to determine the anticoagulation plan. Pharmacy notified the RN to hold the heparin infusion until instructed other wise.  Spoke to on call provider who is ordering  one unit of pRBC. He has ordered a 0100 CBC and wants the Hgb to the 7.5 or higher before resuming. Patient should be monitored closely for further bleeding.   Goal of Therapy:  Heparin level 0.3-0.5 units/ml Monitor platelets by anticoagulation protocol: Yes   Plan:  HOLD heparin Heparin level and CBC Monitor for s/sx of bleeding  Georga Bora, PharmD Clinical Pharmacist 09/07/2017 8:06 PM

## 2017-09-07 NOTE — Progress Notes (Signed)
[facility] Progress Note Patient Name: Pam Fuller DOB: Dec 14, 1932 MRN: [id number]   Date of Service  09/07/2017  HPI/Events of Note  Bleeding from GB drain - BP = 89/39. Hgb = 6.5.   eICU Interventions  Will order: 1. Transfuse 1 unit PRBC now. 2. Hold Heparin IV infusion.  3. CBC at 1   AM.  4. If 1 AM >= 7.5, try to restart Heparin IV infusion. If 1 AM Hgb < 7.5, continue to hold Heparin until patient is reassessed on rounds tomorrow.      Intervention Category Major Interventions: Other:  Lysle Dingwall 09/07/2017, 9:26 PM

## 2017-09-07 NOTE — Progress Notes (Signed)
1mg  versed wasted by Mindy HooperRN, 1mg  versed not given at that time. 1mg  versed wasted witnessed by Sandford Craze RN.

## 2017-09-07 NOTE — Progress Notes (Addendum)
ANTICOAGULATION CONSULT NOTE - Follow Up Consult  Pharmacy Consult for heparin. Indication: LUE DVT  Allergies  Allergen Reactions  . Erythromycin Nausea And Vomiting  . Vicodin [Hydrocodone-Acetaminophen]     unknown    Patient Measurements: Height: 5\' 2"  (157.5 cm) Weight: 140 lb 3.4 oz (63.6 kg) IBW/kg (Calculated) : 50.1 Heparin Dosing Weight: 63.58 kg  Vital Signs: Temp: 97.9 F (36.6 C) (04/20 0753) Temp Source: Axillary (04/20 0753) BP: 90/55 (04/20 0800) Pulse Rate: 82 (04/20 0800)  Labs: Recent Labs    09/05/17 0243  09/05/17 2004 09/06/17 0011 09/06/17 0515 09/06/17 1619 09/07/17 0205 09/07/17 0225 09/07/17 0720  HGB 8.7*  --   --   --  7.7* 7.5*  --  7.0*  --   HCT 27.7*  --   --   --  24.0* 22.8*  --  20.9*  --   PLT 120*  --   --   --  165 197  --  192  --   APTT 99*   < > 82* 82* 77*  --   --   --   --   HEPARINUNFRC  --   --   --   --   --  0.47  --   --  <0.10*  CREATININE 1.07*  --   --   --  2.33*  --  2.11*  --   --    < > = values in this interval not displayed.   Estimated Creatinine Clearance: 18 mL/min (A) (by C-G formula based on SCr of 2.11 mg/dL (H)).  Assessment: 84yo female s/p bivalirudin, which was stopped on 4/18 after HIT antibody negative. Patient's Hgb continues to trend downward 8.7 > 7.7>7.5>7.0. RN reports small amount of blood from biliary drain and yesterday there was report of blood streaks in her stool. Discussed these findings and the Hgb trend with provider. He wishes to continue anticoagulation at this time and asked me to order CBC this afternoon. Pharmacy will continue to dose heparin and aim for lower more conservative goal.    Goal of Therapy:  Heparin level 0.3-0.5 units/ml Monitor platelets by anticoagulation protocol: Yes   Plan:  Increase heparin gtt to 1150 units/hr Heparin level and CBC at 1800 Monitor for s/sx of bleeding  Georga Bora, PharmD Clinical Pharmacist 09/07/2017 10:07 AM      Addendum:  Patient family refusing to allow phlebotomy to collect labs at this time. Will reschedule for 2000.   Georga Bora, PharmD Clinical Pharmacist 09/07/2017 6:39 PM

## 2017-09-07 NOTE — Progress Notes (Signed)
9 Days Post-Op   Subjective/Chief Complaint: No acute changes overnight Remains on the vent   Objective: Vital signs in last 24 hours: Temp:  [97.8 F (36.6 C)-99.2 F (37.3 C)] 98.2 F (36.8 C) (04/20 0425) Pulse Rate:  [79-99] 92 (04/20 0700) Resp:  [20-43] 21 (04/20 0700) BP: (76-123)/(35-70) 85/69 (04/20 0700) SpO2:  [96 %-100 %] 100 % (04/20 0700) FiO2 (%):  [30 %] 30 % (04/20 0310) Weight:  [63.6 kg (140 lb 3.4 oz)-64.2 kg (141 lb 8.6 oz)] 63.6 kg (140 lb 3.4 oz) (04/20 0500) Last BM Date: 09/06/17  Intake/Output from previous day: 04/19 0701 - 04/20 0700 In: 770.3 [I.V.:610; NG/GT:110.3; IV Piggyback:50] Out: 833 [Drains:110] Intake/Output this shift: No intake/output data recorded.  Exam: Sedated On vent Abdomen soft, non-distended, minimally tender Drain with bile  Lab Results:  Recent Labs    09/06/17 1619 09/07/17 0225  WBC 27.9* 26.0*  HGB 7.5* 7.0*  HCT 22.8* 20.9*  PLT 197 192   BMET Recent Labs    09/06/17 0515 09/07/17 0205  NA 135 132*  K 3.5 2.9*  CL 102 94*  CO2 24 26  GLUCOSE 94 129*  BUN 17 12  CREATININE 2.33* 2.11*  CALCIUM 8.6* 8.0*   PT/INR Recent Labs    09/04/17 0923  LABPROT 30.7*  INR 2.98   ABG No results for input(s): PHART, HCO3 in the last 72 hours.  Invalid input(s): PCO2, PO2  Studies/Results: No results found.  Anti-infectives: Anti-infectives (From admission, onward)   Start     Dose/Rate Route Frequency Ordered Stop   09/05/17 2000  piperacillin-tazobactam (ZOSYN) IVPB 3.375 g     3.375 g 12.5 mL/hr over 240 Minutes Intravenous Every 12 hours 09/05/17 1126 09/07/17 0154   08/31/17 0800  piperacillin-tazobactam (ZOSYN) IVPB 3.375 g  Status:  Discontinued     3.375 g 100 mL/hr over 30 Minutes Intravenous Every 6 hours 08/31/17 0739 09/05/17 1126   08/30/17 2000  Ampicillin-Sulbactam (UNASYN) 3 g in sodium chloride 0.9 % 100 mL IVPB  Status:  Discontinued     3 g 200 mL/hr over 30 Minutes  Intravenous Every 24 hours 08/27/2017 0953 08/30/17 0945   08/30/17 1230  piperacillin-tazobactam (ZOSYN) IVPB 3.375 g  Status:  Discontinued     3.375 g 12.5 mL/hr over 240 Minutes Intravenous Every 12 hours 08/30/17 1134 08/31/17 0738   08/31/2017 1030  Ampicillin-Sulbactam (UNASYN) 3 g in sodium chloride 0.9 % 100 mL IVPB     3 g 200 mL/hr over 30 Minutes Intravenous  Once 08/23/2017 0953 08/28/2017 1246   09/14/2017 0000  ampicillin-sulbactam (UNASYN) 1.5 g in sodium chloride 0.9 % 100 mL IVPB  Status:  Discontinued     1.5 g 200 mL/hr over 30 Minutes Intravenous Once 08/28/17 1045 09/12/2017 0928   08/28/2017 2200  piperacillin-tazobactam (ZOSYN) IVPB 3.375 g    Note to Pharmacy:  Zosyn 3.375 g IV q12h in ESRD on HD   3.375 g 12.5 mL/hr over 240 Minutes Intravenous Every 12 hours 08/26/2017 1155 08/25/17 1335   08/22/17 1000  piperacillin-tazobactam (ZOSYN) IVPB 3.375 g  Status:  Discontinued    Note to Pharmacy:  Zosyn 3.375 g IV q12h in ESRD on HD   3.375 g 12.5 mL/hr over 240 Minutes Intravenous Every 12 hours 08/22/17 0128 09/13/2017 1155   08/22/17 0045  piperacillin-tazobactam (ZOSYN) IVPB 3.375 g     3.375 g 12.5 mL/hr over 240 Minutes Intravenous  Once 08/22/17 0038 08/22/17 0145  Assessment/Plan:   LOS: 16 days       Patient Active Problem List   Diagnosis Date Noted  . Shock liver   . Anastomotic leak of biliary tree   . Malnutrition of moderate degree 08/30/2017  . Septic shock ([place])   . NSTEMI (non-ST elevated myocardial infarction) ([place])   . Acute respiratory failure with hypoxia ([place])   . Aspiration into airway   . ESRD on hemodialysis ([place])   . Bile leak   . Acute cholecystitis 08/22/2017  . Accelerated hypertension   . Hyperkalemia   . Syncope 09/14/2015  . Malignant neoplasm of upper-outer quadrant of left breast in female, estrogen receptor positive ([place]) 04/07/2013  . ESRD on dialysis ([place]) 04/07/2013  . Essential hypertension 04/07/2013  . Gout  04/07/2013  . GERD (gastroesophageal reflux disease)     S/p lap chole with bile leak, IR placed drain in biloma Continuing care per CCM GI following.  Possible stent when stable  Chayil Gantt A 09/07/2017

## 2017-09-07 NOTE — Progress Notes (Signed)
PULMONARY / CRITICAL CARE MEDICINE   Name: Pam Fuller MRN: [id number] DOB: 1932/12/11    ADMISSION DATE:  09/13/2017 CONSULTATION DATE:  4/11  REFERRING MD:  Dr. Maudie Mercury  CHIEF COMPLAINT:  Aspiration  BRIEF SUMMARY:   [name] is a 84 year old female with ESRD on MWF HD, HTN, Anemia, and hx of breast cancer who presented to the ED on 4/4 with RLQ abdominal pain. She was found to have acute cholecystitis with a gallstone lodged in the gallbladder neck. CBD measured up to 10 mm. She underwent laparoscopic cholecystectomy on 4/6. She initially did well but on 4/8 was noted to have worsened pain and confusion and thought to have post-op ileus. CT abd/pelvis 4/8 showed small-mod fluid in the abdominal pelvis, read as normal post op changes but could not exclude a bile leak. A hepatobiliary scan was obtained on 4/9 which did suggest a bile leak. Surgery recommended IR drain and GI consult for ERCP. She went to the Endoscopy unit on 4/11 AM for ERCP however during prep with anesthesia she had significant bilious emesis with hypotension requiring intubation and pressor support. ERCP was not attempted. She was subsequently transferred to the medical ICU.   STUDIES:  CT abd/pelv 4/4 >> acute cholecystitis, gallstone in GB neck, CBD 10 mm  CT Head: no acute changes, chronic small vessel ischemic changes  CT abd/pelvis 4/8 >> small-mod fluid in abd/pelvis  Hepatobiliary scan 4/9 >> bile leak  TTE 4/12 >> EF 65-70%, G1DD, narrow LVOT, mild TR, mild pHTN  U/S Abdomen 4/14 >> small fluid near cholecystectomy site  Hepatobiliary scan 4/14 >> Rt subhepatic fluid collection consistent w/ biloma; increased in size from prior  CT abd/pelvis w/ oral contrast 4/16 >> drainage catheter at inferior liver margin with decreased perihepatic fluid; low-density fluid at left subdiaphragmatic area, questionable colonic wall thickening throughout, multifocal airspace disease of lung  bases   CULTURES: Tracheal aspirate 4/11 >> no growth Abd fluid 4/11 >> no growth 5 days Abd fluid 4/15 >> no growth 3 days  ANTIBIOTICS: Zosyn 4/3 >> 4/7 Unasyn 4/11 Zosyn 4/12 >>  LINES/TUBES: ETT 4/11 OGT 4/11 PIV RUE 4/9 RLQ Biliary Drain 4/11 HD Cath LIJ 4/12 >> 4/19  SIGNIFICANT EVENTS: 4/4 - admitted w/ acute cholecystitis 4/6 - lap chole 4/9 - bile leak seen on hepatobiliary scan 4/11 - Aspirated during prep for ERCP, intubated and transferred to ICU 4/11 - Rt abd fluid drain placed by IR 4/12- Heparin gtt started for NSTEMI; held after OG bleeding and trop peak 4/13 - worsening liver enzyymes. ESRD - Anuric. CRRT started. On levophed 11mcg and fent 1109mcg 4/14 - Hgb 6.0, transfused 2 units PRBCs 4/17 - DVT LUE - started bivalirudin for suspected HIT 4/18 - CVVH d/c'ed, return to intermittent HD. HIT ab negative - changed bival to heparin   SUBJECTIVE/OVERNIGHT/INTERVAL HX Tolerating some pressure support 12 right now.  She was tachypneic and tolerated poorly while undergoing intermittent HD on 4/19  VITAL SIGNS: BP (!) 90/55   Pulse 82   Temp 97.9 F (36.6 C) (Axillary)   Resp (!) 21   Ht 5\' 2"  (1.575 m)   Wt 63.6 kg (140 lb 3.4 oz)   SpO2 100%   BMI 25.65 kg/m   HEMODYNAMICS:    VENTILATOR SETTINGS: Vent Mode: PSV;CPAP FiO2 (%):  [30 %] 30 % Set Rate:  [27 bmp-33 bmp] 27 bmp Vt Set:  [300 mL] 300 mL PEEP:  [5 cmH20] 5 cmH20 Pressure Support:  [  Stanton Pressure:  [12 cmH20-19 cmH20] 16 cmH20  INTAKE / OUTPUT: I/O last 3 completed shifts: In: 1865.3 [I.V.:1160; NG/GT:605.3; IV Piggyback:100] Out: 895 [Emesis/NG output:100; Drains:130; QPYPP:509; Stool:1]  PHYSICAL EXAMINATION: General: Elderly ill-appearing woman, mechanically intubated and ventilated HEENT: ET tube in place, left IJ catheter removed, oropharynx otherwise clear Cardiac: Geter, borderline tachycardic, no murmur Pulm: Bilateral scattered inspiratory  crackles, no wheezing Abd: Right abdominal fluid drain in place, positive bowel sounds Ext: Left upper extremity edema, much larger than the right, left upper extremity fistula with good thrill Neuro: Opens eyes to voice, tracks, follows commands and nods to questions  PULMONARY Recent Labs  Lab 09/01/17 0834 09/01/17 1151 09/02/17 0342 09/03/17 0315  PHART 7.373 7.323* 7.307* 7.354  PCO2ART 49.3* 52.7* 50.9* 48.8*  PO2ART 161.0* 87.0 97.0 90.3  HCO3 28.7* 27.5 25.4 26.8  TCO2 30 29 27   --   O2SAT 99.0 96.0 97.0 97.3    CBC Recent Labs  Lab 09/06/17 0515 09/06/17 1619 09/07/17 0225  HGB 7.7* 7.5* 7.0*  HCT 24.0* 22.8* 20.9*  WBC 23.9* 27.9* 26.0*  PLT 165 197 192    COAGULATION Recent Labs  Lab 09/04/17 0923  INR 2.98    CARDIAC   No results for input(s): TROPONINI in the last 168 hours. No results for input(s): PROBNP in the last 168 hours.   CHEMISTRY Recent Labs  Lab 09/04/17 0445 09/04/17 1542 09/05/17 0243 09/05/17 1707 09/06/17 0515 09/06/17 1619 09/07/17 0205 09/07/17 0720  NA 132* 134* 135  --  135  --  132*  --   K 4.1 4.1 4.1  --  3.5  --  2.9*  --   CL 100* 101 103  --  102  --  94*  --   CO2 26 25 26   --  24  --  26  --   GLUCOSE 102* 108* 113*  --  94  --  129*  --   BUN 6 <5* 6  --  17  --  12  --   CREATININE 1.29* 1.32* 1.07*  --  2.33*  --  2.11*  --   CALCIUM 8.0* 8.2* 8.2*  --  8.6*  --  8.0*  --   MG 2.2  --  2.2 2.5* 2.3 1.9 1.8 2.5*  PHOS 2.3* 2.8 2.2* 2.2* 2.2* 4.3 3.7 4.1   Estimated Creatinine Clearance: 18 mL/min (A) (by C-G formula based on SCr of 2.11 mg/dL (H)).  I/O last 3 completed shifts: In: 1865.3 [I.V.:1160; NG/GT:605.3; IV Piggyback:100] Out: 895 [Emesis/NG output:100; Drains:130; TOIZT:245; Stool:1]   Intake/Output Summary (Last 24 hours) at 09/07/2017 0938 Last data filed at 09/07/2017 0700 Gross per 24 hour  Intake 770.33 ml  Output 774 ml  Net -3.67 ml     LIVER Recent Labs  Lab 09/02/17 0407   09/03/17 0355  09/04/17 0445 09/04/17 0923 09/04/17 1542 09/05/17 0243 09/06/17 0515 09/07/17 0205  AST 652*  --  315*  --  157*  --   --  113* 68*  --   ALT 515*  --  337*  --  234*  --   --  166* 112*  --   ALKPHOS 336*  --  442*  --  510*  --   --  478* 458*  --   BILITOT 4.2*  --  4.9*  --  4.6*  --   --  4.0* 3.5*  --   PROT 5.7*  --  5.8*  --  5.9*  --   --  5.7* 5.5*  --   ALBUMIN 1.8*  1.7*   < > 1.6*  1.7*   < > 1.7*  --  1.5* 1.6* 1.5* 1.5*  INR  --   --   --   --   --  2.98  --   --   --   --    < > = values in this interval not displayed.     INFECTIOUS Recent Labs  Lab 09/01/17 0018  LATICACIDVEN 1.8     ENDOCRINE CBG (last 3)  Recent Labs    09/06/17 1921 09/06/17 2316 09/07/17 0334  GLUCAP 105* 110* 109*     IMAGING x48h  - image(s) personally visualized  -   highlighted in bold Dg Chest Port 1 View  Result Date: 09/07/2017 CLINICAL DATA:  Intubated. EXAM: PORTABLE CHEST 1 VIEW COMPARISON:  09/05/2017 FINDINGS: Endotracheal tube in satisfactory position. Nasogastric to side hole in the mid stomach, tip not included. The left jugular catheter has been removed. Normal sized heart. Mild patchy opacity in both lungs with improvement. Left breast surgical clips and cholecystectomy clips. Thoracic spine degenerative changes. IMPRESSION: Improving bilateral pneumonia. Electronically Signed   By: Claudie Revering M.D.   On: 09/07/2017 08:48     DISCUSSION: Ms. Saanvi Hakala is a 84 year old female with ESRD on MWF HD, HTN, Anemia, and hx of breast cancer who was admitted 4/4 with acute cholecystitis. S/p lap chole 4/6 complicated by post-op bile leak. Went for ERCP 4/11 however developed bilious emesis during prep, hypotension, and required intubation plus pressor support. Transferred to the ICU on 4/11.  ASSESSMENT / PLAN: PULMONARY A: Acute Respiratory Distress 2/2 Aspiration Requiring Mechanical Ventilation since 4/11 P:   Transition tidal volumes to 8  cc/kg on 4/19 Continue efforts at spontaneous breathing, pressure support trials.  Hopefully she will be able to progressively tolerate.  It is concerning that she tolerated poorly during IHD Follow chest x-ray  CARDIOVASCULAR A:  #baseline Aortic Atherosclerosis on CXR RBBB - present prior to admission #current Septic Shock/Hypotension, resolved Troponin elevation (peak 4.47) 2/2 demand ischemia in setting of hypotensive event Echocardiogram w/ EF 65-70% Prolonged QTc LUE DVT P:  Remains off pressors Heparin started 4/19, no progressive anemia Continue aspirin as ordered Continue Midodrin, increase to 10 mg 4/20   RENAL A:   ESRD on MWF HD AVF LUE Hypokalemia Hypophosphatemia P:   Remittent HD as per nephrology plans Follow BMP Replace electrolytes as indicated  GASTROINTESTINAL A:   Acute Cholecystitis s/p lap chole 4/6 Bile leak s/p IR Rt abdominal fluid drain 4/11 - biloma by HIDA 4/14 Shock Liver, improved  nutrition SUP P:   Right abdominal fluid drain in place, continue to drain Appreciate surgery and gastroenterology assistance Plan for ERCP when the patient is more stable, likely next week Continue trickle tube feeds, do not advance at this time given her nausea, emesis Continue Protonix as ordered   HEMATOLOGIC A:   Hx of Breast Cancer - no acute issue Anemia of Chronic Disease -  S/p 2 units PRBCs 4/14 Leukocytosis Thrombocytopenia LUE (IJV) DVT Blood in stool overnight P:  HGB goal > 7.0 Follow CBC on heparin Bivalirudin stopped, heparin initiated after HIT antibody was negative Left IJ temporary HD catheter removed 4/19  INFECTIOUS A:   Septic Shock from Aspiration PNA Bile Leak s/p IR Biliary Drain 4/11 P:   Zosyn 4/12 >> 4/19  ENDOCRINE A:   Secondary  Hyperparathyroidism Hypoglycemia P:   KVO IV fluids, D5  NEUROLOGIC A:   Pain/Agitation P:   RASS goal: 0 to -1 Fentanyl as needed Versed as needed   FAMILY  - Updates:  Sons updated at bedside 4/17 and 4/19.     Independent CC time 32 minutes  Baltazar Apo, MD, PhD 09/07/2017, 9:48 AM [facility] [phone] or if no answer [phone]

## 2017-09-07 NOTE — Progress Notes (Signed)
[facility] Progress Note Patient Name: DAINELLE HUN DOB: [birth year] MRN: [id number]   Date of Service  09/07/2017  HPI/Events of Note  Nausea and vomiting - Patient has NGT.   eICU Interventions  Will order: 1. Phenergan 12.5 mg IV/PR Q 6 hours PRN N/V.     Intervention Category Major Interventions: Other:  Sommer,Steven Cornelia Copa 09/07/2017, 8:05 PM

## 2017-09-08 ENCOUNTER — Inpatient Hospital Stay (HOSPITAL_COMMUNITY): Payer: Medicare Other

## 2017-09-08 DIAGNOSIS — K838 Other specified diseases of biliary tract: Secondary | ICD-10-CM

## 2017-09-08 DIAGNOSIS — K7200 Acute and subacute hepatic failure without coma: Secondary | ICD-10-CM

## 2017-09-08 LAB — BASIC METABOLIC PANEL
ANION GAP: 29 — AB (ref 5–15)
ANION GAP: 20 — AB (ref 5–15)
Anion gap: 24 — ABNORMAL HIGH (ref 5–15)
BUN: 29 mg/dL — ABNORMAL HIGH (ref 6–20)
BUN: 34 mg/dL — AB (ref 6–20)
BUN: 34 mg/dL — ABNORMAL HIGH (ref 6–20)
CALCIUM: 8.7 mg/dL — AB (ref 8.9–10.3)
CHLORIDE: 95 mmol/L — AB (ref 101–111)
CO2: 12 mmol/L — AB (ref 22–32)
CO2: 19 mmol/L — AB (ref 22–32)
CO2: 22 mmol/L (ref 22–32)
CREATININE: 4.03 mg/dL — AB (ref 0.44–1.00)
Calcium: 9.2 mg/dL (ref 8.9–10.3)
Calcium: 8.5 mg/dL — ABNORMAL LOW (ref 8.9–10.3)
Chloride: 96 mmol/L — ABNORMAL LOW (ref 101–111)
Chloride: 93 mmol/L — ABNORMAL LOW (ref 101–111)
Creatinine, Ser: 4.07 mg/dL — ABNORMAL HIGH (ref 0.44–1.00)
Creatinine, Ser: 4.04 mg/dL — ABNORMAL HIGH (ref 0.44–1.00)
GFR calc Af Amer: 11 mL/min — ABNORMAL LOW (ref >60)
GFR calc non Af Amer: 9 mL/min — ABNORMAL LOW (ref >60)
GFR, EST AFRICAN AMERICAN: 11 mL/min — AB (ref >60)
GFR, EST AFRICAN AMERICAN: 11 mL/min — AB (ref >60)
GFR, EST NON AFRICAN AMERICAN: 9 mL/min — AB (ref >60)
GFR, EST NON AFRICAN AMERICAN: 9 mL/min — AB (ref >60)
GLUCOSE: 292 mg/dL — AB (ref 65–99)
Glucose, Bld: 35 mg/dL — CL (ref 65–99)
Glucose, Bld: 284 mg/dL — ABNORMAL HIGH (ref 65–99)
Potassium: 4.5 mmol/L (ref 3.5–5.1)
Potassium: 3.5 mmol/L (ref 3.5–5.1)
Potassium: 3.3 mmol/L — ABNORMAL LOW (ref 3.5–5.1)
SODIUM: 137 mmol/L (ref 135–145)
Sodium: 136 mmol/L (ref 135–145)
Sodium: 137 mmol/L (ref 135–145)

## 2017-09-08 LAB — LACTIC ACID, PLASMA: Lactic Acid, Venous: 10.8 mmol/L (ref 0.5–1.9)

## 2017-09-08 LAB — GLUCOSE, CAPILLARY
GLUCOSE-CAPILLARY: 76 mg/dL (ref 65–99)
Glucose-Capillary: 133 mg/dL — ABNORMAL HIGH (ref 65–99)
Glucose-Capillary: 157 mg/dL — ABNORMAL HIGH (ref 65–99)
Glucose-Capillary: 256 mg/dL — ABNORMAL HIGH (ref 65–99)
Glucose-Capillary: 275 mg/dL — ABNORMAL HIGH (ref 65–99)
Glucose-Capillary: 317 mg/dL — ABNORMAL HIGH (ref 65–99)
Glucose-Capillary: 39 mg/dL — CL (ref 65–99)
Glucose-Capillary: 63 mg/dL — ABNORMAL LOW (ref 65–99)
Glucose-Capillary: 84 mg/dL (ref 65–99)

## 2017-09-08 LAB — CBC
HEMATOCRIT: 28 % — AB (ref 36.0–46.0)
HEMATOCRIT: 23.1 % — AB (ref 36.0–46.0)
HEMATOCRIT: 22.1 % — AB (ref 36.0–46.0)
Hemoglobin: 8.7 g/dL — ABNORMAL LOW (ref 12.0–15.0)
Hemoglobin: 7.6 g/dL — ABNORMAL LOW (ref 12.0–15.0)
Hemoglobin: 7.7 g/dL — ABNORMAL LOW (ref 12.0–15.0)
MCH: 29.1 pg (ref 26.0–34.0)
MCH: 29.1 pg (ref 26.0–34.0)
MCH: 29.4 pg (ref 26.0–34.0)
MCHC: 31.1 g/dL (ref 30.0–36.0)
MCHC: 32.9 g/dL (ref 30.0–36.0)
MCHC: 34.8 g/dL (ref 30.0–36.0)
MCV: 93.6 fL (ref 78.0–100.0)
MCV: 88.5 fL (ref 78.0–100.0)
MCV: 84.4 fL (ref 78.0–100.0)
Platelets: 154 10*3/uL (ref 150–400)
Platelets: 189 10*3/uL (ref 150–400)
Platelets: 147 10*3/uL — ABNORMAL LOW (ref 150–400)
RBC: 2.99 MIL/uL — AB (ref 3.87–5.11)
RBC: 2.61 MIL/uL — ABNORMAL LOW (ref 3.87–5.11)
RBC: 2.62 MIL/uL — ABNORMAL LOW (ref 3.87–5.11)
RDW: 16.9 % — ABNORMAL HIGH (ref 11.5–15.5)
RDW: 17.2 % — AB (ref 11.5–15.5)
RDW: 15.5 % (ref 11.5–15.5)
WBC: 27.3 10*3/uL — ABNORMAL HIGH (ref 4.0–10.5)
WBC: 19.5 10*3/uL — AB (ref 4.0–10.5)
WBC: 24.9 10*3/uL — ABNORMAL HIGH (ref 4.0–10.5)

## 2017-09-08 LAB — PREPARE RBC (CROSSMATCH)

## 2017-09-08 LAB — BLOOD GAS, ARTERIAL
Acid-base deficit: 14.7 mmol/L — ABNORMAL HIGH (ref 0.0–2.0)
Bicarbonate: 11.1 mmol/L — ABNORMAL LOW (ref 20.0–28.0)
Drawn by: 398991
FIO2: 50
LHR: 27 {breaths}/min
O2 SAT: 98.6 %
PCO2 ART: 25.9 mmHg — AB (ref 32.0–48.0)
PEEP: 5 cmH2O
Patient temperature: 98.6
VT: 300 mL
pH, Arterial: 7.253 — ABNORMAL LOW (ref 7.350–7.450)
pO2, Arterial: 166 mmHg — ABNORMAL HIGH (ref 83.0–108.0)

## 2017-09-08 LAB — HEPATIC FUNCTION PANEL
ALBUMIN: 1.3 g/dL — AB (ref 3.5–5.0)
ALK PHOS: 350 U/L — AB (ref 38–126)
ALT: 257 U/L — AB (ref 14–54)
AST: 851 U/L — AB (ref 15–41)
Bilirubin, Direct: 2.3 mg/dL — ABNORMAL HIGH (ref 0.1–0.5)
Indirect Bilirubin: 1.7 mg/dL — ABNORMAL HIGH (ref 0.3–0.9)
TOTAL PROTEIN: 4.4 g/dL — AB (ref 6.5–8.1)
Total Bilirubin: 4 mg/dL — ABNORMAL HIGH (ref 0.3–1.2)

## 2017-09-08 LAB — MAGNESIUM: Magnesium: 2.8 mg/dL — ABNORMAL HIGH (ref 1.7–2.4)

## 2017-09-08 MED ORDER — DEXTROSE 50 % IV SOLN
INTRAVENOUS | Status: AC
  Administered 2017-09-08: 50 mL
  Filled 2017-09-08: qty 50

## 2017-09-08 MED ORDER — SODIUM CHLORIDE 0.9 % IV SOLN
Freq: Once | INTRAVENOUS | Status: AC
  Administered 2017-09-08: 11:00:00 via INTRAVENOUS

## 2017-09-08 MED ORDER — SODIUM CHLORIDE 0.9 % IV SOLN: 2.0000 mg/h | Freq: Once | INTRAVENOUS | Status: DC

## 2017-09-08 MED ORDER — DEXTROSE 50 % IV SOLN
INTRAVENOUS | Status: AC
  Administered 2017-09-08: 50 mL via INTRAVENOUS
  Filled 2017-09-08: qty 50

## 2017-09-08 MED ORDER — SODIUM CHLORIDE 0.9 % IV BOLUS: 500.0000 mL | Freq: Once | INTRAVENOUS | Status: DC

## 2017-09-08 MED ORDER — FAMOTIDINE IN NACL 20-0.9 MG/50ML-% IV SOLN
20.0000 mg | Freq: Two times a day (BID) | INTRAVENOUS | Status: DC
  Administered 2017-09-08: 20 mg via INTRAVENOUS
  Filled 2017-09-08: qty 50

## 2017-09-08 MED ORDER — DEXTROSE 5 % IV SOLN
INTRAVENOUS | Status: DC
  Administered 2017-09-08 – 2017-09-09 (×2): via INTRAVENOUS
  Filled 2017-09-08 (×3): qty 150

## 2017-09-08 MED ORDER — PROMETHAZINE HCL 12.5 MG RE SUPP
6.2500 mg | Freq: Four times a day (QID) | RECTAL | Status: DC | PRN
  Filled 2017-09-08: qty 1

## 2017-09-08 MED ORDER — SODIUM BICARBONATE 8.4 % IV SOLN
50.0000 meq | Freq: Once | INTRAVENOUS | Status: AC
  Administered 2017-09-08: 50 meq via INTRAVENOUS
  Filled 2017-09-08: qty 50

## 2017-09-08 MED ORDER — HEPARIN SODIUM (PORCINE) 1000 UNIT/ML DIALYSIS
1000.0000 [IU] | INTRAMUSCULAR | Status: DC | PRN
  Administered 2017-09-10: 2400 [IU] via INTRAVENOUS_CENTRAL
  Administered 2017-09-11 – 2017-09-16 (×2): 2800 [IU] via INTRAVENOUS_CENTRAL
  Filled 2017-09-08 (×4): qty 6
  Filled 2017-09-08: qty 3

## 2017-09-08 MED ORDER — MIDAZOLAM HCL 2 MG/2ML IJ SOLN
INTRAMUSCULAR | Status: AC
  Filled 2017-09-08: qty 2

## 2017-09-08 MED ORDER — PANTOPRAZOLE SODIUM 40 MG IV SOLR: 40.0000 mg | Freq: Two times a day (BID) | INTRAVENOUS | Status: DC

## 2017-09-08 MED ORDER — DEXTROSE 50 % IV SOLN
INTRAVENOUS | Status: AC
  Administered 2017-09-08: 50 mL via INTRAVENOUS
  Filled 2017-09-08: qty 100

## 2017-09-08 MED ORDER — PIPERACILLIN-TAZOBACTAM 3.375 G IVPB 30 MIN
3.3750 g | Freq: Four times a day (QID) | INTRAVENOUS | Status: DC
  Administered 2017-09-08 – 2017-09-16 (×31): 3.375 g via INTRAVENOUS
  Filled 2017-09-08 (×33): qty 50

## 2017-09-08 MED ORDER — MIDAZOLAM HCL 2 MG/2ML IJ SOLN: 2.0000 mg | Freq: Once | INTRAMUSCULAR | Status: AC

## 2017-09-08 MED ORDER — NOREPINEPHRINE BITARTRATE 1 MG/ML IV SOLN
0.0000 ug/min | INTRAVENOUS | Status: DC
  Administered 2017-09-08: 6 ug/min via INTRAVENOUS
  Administered 2017-09-08: 2 ug/min via INTRAVENOUS
  Administered 2017-09-09: 16 ug/min via INTRAVENOUS
  Administered 2017-09-09: 6 ug/min via INTRAVENOUS
  Administered 2017-09-09: 11 ug/min via INTRAVENOUS
  Administered 2017-09-10 (×2): 7 ug/min via INTRAVENOUS
  Administered 2017-09-10: 14 ug/min via INTRAVENOUS
  Filled 2017-09-08 (×7): qty 4

## 2017-09-08 MED ORDER — STERILE WATER FOR INJECTION IV SOLN: INTRAVENOUS | Status: DC

## 2017-09-08 MED ORDER — DEXTROSE 50 % IV SOLN
1.0000 | Freq: Once | INTRAVENOUS | Status: AC
  Administered 2017-09-08: 50 mL via INTRAVENOUS

## 2017-09-08 MED ORDER — FENTANYL CITRATE (PF) 100 MCG/2ML IJ SOLN
INTRAMUSCULAR | Status: AC
  Administered 2017-09-08: 100 ug via INTRAVENOUS
  Filled 2017-09-08: qty 2

## 2017-09-08 MED ORDER — SODIUM CHLORIDE 0.9 % IV SOLN: 0.5000 mg/h | INTRAVENOUS | Status: DC

## 2017-09-08 MED ORDER — PRISMASOL BGK 4/2.5 32-4-2.5 MEQ/L IV SOLN
INTRAVENOUS | Status: DC
  Administered 2017-09-08 – 2017-09-23 (×14): via INTRAVENOUS_CENTRAL
  Filled 2017-09-08 (×17): qty 5000

## 2017-09-08 MED ORDER — PANTOPRAZOLE SODIUM 40 MG IV SOLR
40.0000 mg | Freq: Two times a day (BID) | INTRAVENOUS | Status: DC
  Administered 2017-09-08 – 2017-09-12 (×9): 40 mg via INTRAVENOUS
  Filled 2017-09-08 (×9): qty 40

## 2017-09-08 MED ORDER — FENTANYL CITRATE (PF) 100 MCG/2ML IJ SOLN
100.0000 ug | Freq: Once | INTRAMUSCULAR | Status: AC
  Administered 2017-09-08: 100 ug via INTRAVENOUS

## 2017-09-08 MED ORDER — PRISMASOL BGK 4/2.5 32-4-2.5 MEQ/L IV SOLN
INTRAVENOUS | Status: DC
  Administered 2017-09-08 – 2017-09-23 (×91): via INTRAVENOUS_CENTRAL
  Filled 2017-09-08 (×110): qty 5000

## 2017-09-08 MED ORDER — SODIUM CHLORIDE 0.9 % IV SOLN
80.0000 mg | Freq: Once | INTRAVENOUS | Status: DC
  Filled 2017-09-08: qty 80

## 2017-09-08 MED ORDER — SODIUM BICARBONATE 8.4 % IV SOLN
INTRAVENOUS | Status: AC
  Administered 2017-09-08: 50 meq via INTRAVENOUS
  Filled 2017-09-08: qty 100

## 2017-09-08 MED ORDER — SODIUM CHLORIDE 0.9 % FOR CRRT
INTRAVENOUS_CENTRAL | Status: DC | PRN
  Administered 2017-09-13 – 2017-09-15 (×3): via INTRAVENOUS_CENTRAL
  Filled 2017-09-08: qty 1000

## 2017-09-08 MED ORDER — DEXTROSE 50 % IV SOLN
50.0000 mL | Freq: Once | INTRAVENOUS | Status: AC
  Administered 2017-09-08: 50 mL via INTRAVENOUS

## 2017-09-08 MED ORDER — ALTEPLASE 2 MG IJ SOLR
2.0000 mg | Freq: Once | INTRAMUSCULAR | Status: DC | PRN
  Filled 2017-09-08: qty 2

## 2017-09-08 MED ORDER — PROMETHAZINE HCL 25 MG/ML IJ SOLN: 6.2500 mg | Freq: Four times a day (QID) | INTRAMUSCULAR | Status: DC | PRN

## 2017-09-08 MED ORDER — DEXTROSE 50 % IV SOLN
1.0000 | Freq: Every day | INTRAVENOUS | Status: DC | PRN
  Administered 2017-09-08 – 2017-09-09 (×2): 50 mL via INTRAVENOUS

## 2017-09-08 MED ORDER — TRACE MINERALS CR-CU-MN-SE-ZN 10-1000-500-60 MCG/ML IV SOLN
INTRAVENOUS | Status: AC
  Filled 2017-09-08: qty 240

## 2017-09-08 MED ORDER — SODIUM CHLORIDE 0.9 % IV BOLUS
1000.0000 mL | Freq: Once | INTRAVENOUS | Status: AC
  Administered 2017-09-08: 1000 mL via INTRAVENOUS

## 2017-09-08 MED ORDER — PRISMASOL BGK 4/2.5 32-4-2.5 MEQ/L IV SOLN
INTRAVENOUS | Status: DC
  Administered 2017-09-08 – 2017-09-23 (×23): via INTRAVENOUS_CENTRAL
  Filled 2017-09-08 (×30): qty 5000

## 2017-09-08 NOTE — Progress Notes (Signed)
Pt transferred off the unit for CT scan.  Unable to hang TNA at this time.  Spoke with Corinne Ports re more access needed for pt due to Levo and TNA.[name]

## 2017-09-08 NOTE — Procedures (Signed)
Hemodialysis Insertion Procedure Note DAJSHA MASSARO [id number] March 18, 1933  Procedure: Insertion of Hemodialysis Catheter Type: 3 port  Indications: Hemodialysis   Procedure Details Consent: Risks of procedure as well as the alternatives and risks of each were explained to the (patient/caregiver).  Consent for procedure obtained. Time Out: Verified patient identification, verified procedure, site/side was marked, verified correct patient position, special equipment/implants available, medications/allergies/relevent history reviewed, required imaging and test results available.  Performed  Maximum sterile technique was used including antiseptics, cap, gloves, gown, hand hygiene, mask and sheet. Skin prep: Chlorhexidine; local anesthetic administered A antimicrobial bonded/coated triple lumen catheter was placed in the left femoral vein due to patient being a dialysis patient using the Seldinger technique. Ultrasound guidance used.Yes.   Catheter placed to 20 cm. Blood aspirated via all 3 ports and then flushed x 3. Line sutured x 2 and dressing applied.  Evaluation Blood flow good Complications: No apparent complications Patient did tolerate procedure well. Chest X-ray ordered to verify placement.  CXR: normal.  Richardson Landry Devlynn Knoff ACNP Maryanna Shape PCCM Pager [phone] till 1 pm If no answer page [phone] 09/08/2017, 12:46 PM

## 2017-09-08 NOTE — Progress Notes (Signed)
Called to inform Dr Oletta Darter cbc specimen drawn 1 hour post prbc transfusion, specimen currently being processed. MD has states no concern for specimen not being drawn 2 hour post transfusion.

## 2017-09-08 NOTE — Progress Notes (Signed)
Pharmacy Antibiotic Note  Pam Fuller is a 84 y.o. female admitted on 09/09/2017 with sepsis.  Pharmacy has been consulted for Zosyn dosing.  CVVHD restarted  Plan: Zosyn 3.375 grams iv Q 6 hours Continue to follow  Height: 5\' 2"  (157.5 cm) Weight: 140 lb 10.5 oz (63.8 kg) IBW/kg (Calculated) : 50.1  Temp (24hrs), Avg:95.7 F (35.4 C), Min:93.4 F (34.1 C), Max:97.4 F (36.3 C)  Recent Labs  Lab 09/06/17 0515 09/06/17 1619 09/07/17 0205 09/07/17 0225 09/07/17 1848 09/07/17 2022 09/08/17 0120 09/08/17 0655 09/08/17 1552  WBC 23.9* 27.9*  --  26.0*  --  21.1* 27.3* 19.5*  --   CREATININE 2.33*  --  2.11*  --  3.18*  --   --  4.03* 4.07*  LATICACIDVEN  --   --   --   --   --   --   --   --  10.8*    Estimated Creatinine Clearance: 9.4 mL/min (A) (by C-G formula based on SCr of 4.07 mg/dL (H)).    Allergies  Allergen Reactions  . Erythromycin Nausea And Vomiting  . Vicodin [Hydrocodone-Acetaminophen]     unknown     Thank you for allowing pharmacy to be a part of this patient's care. Anette Guarneri, PharmD [phone] 09/08/2017 7:46 PM

## 2017-09-08 NOTE — Progress Notes (Addendum)
PULMONARY / CRITICAL CARE MEDICINE   Name: Pam Fuller MRN: [id number] DOB: Jan 17, 1933    ADMISSION DATE:  08/23/2017 CONSULTATION DATE:  4/11  REFERRING MD:  Dr. Maudie Mercury  CHIEF COMPLAINT:  Aspiration  BRIEF SUMMARY:   [name] is a 84 year old female with ESRD on MWF HD, HTN, Anemia, and hx of breast cancer who presented to the ED on 4/4 with RLQ abdominal pain. She was found to have acute cholecystitis with a gallstone lodged in the gallbladder neck. CBD measured up to 10 mm. She underwent laparoscopic cholecystectomy on 4/6. She initially did well but on 4/8 was noted to have worsened pain and confusion and thought to have post-op ileus. CT abd/pelvis 4/8 showed small-mod fluid in the abdominal pelvis, read as normal post op changes but could not exclude a bile leak. A hepatobiliary scan was obtained on 4/9 which did suggest a bile leak. Surgery recommended IR drain and GI consult for ERCP. She went to the Endoscopy unit on 4/11 AM for ERCP however during prep with anesthesia she had significant bilious emesis with hypotension requiring intubation and pressor support. ERCP was not attempted. She was subsequently transferred to the medical ICU.   STUDIES:  CT abd/pelv 4/4 >> acute cholecystitis, gallstone in GB neck, CBD 10 mm  CT Head: no acute changes, chronic small vessel ischemic changes  CT abd/pelvis 4/8 >> small-mod fluid in abd/pelvis  Hepatobiliary scan 4/9 >> bile leak  TTE 4/12 >> EF 65-70%, G1DD, narrow LVOT, mild TR, mild pHTN  U/S Abdomen 4/14 >> small fluid near cholecystectomy site  Hepatobiliary scan 4/14 >> Rt subhepatic fluid collection consistent w/ biloma; increased in size from prior  CT abd/pelvis w/ oral contrast 4/16 >> drainage catheter at inferior liver margin with decreased perihepatic fluid; low-density fluid at left subdiaphragmatic area, questionable colonic wall thickening throughout, multifocal airspace disease of lung  bases   CULTURES: Tracheal aspirate 4/11 >> no growth Abd fluid 4/11 >> no growth 5 days Abd fluid 4/15 >> no growth 3 days  ANTIBIOTICS: Zosyn 4/3 >> 4/7 Unasyn 4/11 Zosyn 4/12 - 4/19  LINES/TUBES: ETT 4/11 OGT 4/11 PIV RUE 4/9 RLQ Biliary Drain 4/11 HD Cath [facility]J 4/12 >> 4/19  SIGNIFICANT EVENTS: 4/4 - admitted w/ acute cholecystitis 4/6 - lap chole 4/9 - bile leak seen on hepatobiliary scan 4/11 - Aspirated during prep for ERCP, intubated and transferred to ICU 4/11 - Rt abd fluid drain placed by IR 4/12- Heparin gtt started for NSTEMI; held after OG bleeding and trop peak 4/13 - worsening liver enzyymes. ESRD - Anuric. CRRT started. On levophed 24mcg and fent 19mcg 4/14 - Hgb 6.0, transfused 2 units PRBCs 4/17 - DVT LUE - started bivalirudin for suspected HIT 4/18 - CVVH d/c'ed, return to intermittent HD. HIT 4/19 - Restarted HD  4/20 - Bleeding from GB drain, OGT, and stools. Received 1u pRBC. Heparin gtt held.   SUBJECTIVE/OVERNIGHT/INTERVAL HX - Hypothermic and hypotensive overnight. Bleeding noted form GB drain. Hgb 6.5 overnight a 1 unit pRBC ordered. Also N/V.    VITAL SIGNS: BP (!) 126/49 (BP Location: Right Leg)   Pulse 91   Temp (!) 97.2 F (36.2 C) (Oral)   Resp (!) 40   Ht 5\' 2"  (1.575 m)   Wt 140 lb 10.5 oz (63.8 kg)   SpO2 97%   BMI 25.73 kg/m   HEMODYNAMICS:    VENTILATOR SETTINGS: Vent Mode: PRVC FiO2 (%):  [30 %] 30 % Set Rate:  [  27 bmp] 27 bmp Vt Set:  [300 mL] 300 mL PEEP:  [5 cmH20] 5 cmH20 Pressure Support:  [12 cmH20] 12 cmH20 Plateau Pressure:  [17 cmH20] 17 cmH20  INTAKE / OUTPUT: I/O last 3 completed shifts: In: 770.3 [I.V.:610; NG/GT:110.3; IV Piggyback:50] Out: 166 [Drains:110; Other:664]  PHYSICAL EXAMINATION: General: chronically ill-appearing female, mechanically intubated and sedated  HEENT: NCAT, ET and OG tube in place  Cardiac: RRR, 2/6 systolic murmur  Pulm: bilateral crackles more at the bases, mild expiratory  wheezing  Abd: soft, NTND  Ext: LUE edema L>>R, unchanged from yesterday, no lower extremity edema, L AVF with palpable thrill  Neuro: R pupil fixed and dilated, does not arouse to voice, does move all 4 extremities spontaneously but no purposefully, does not follow commands    PULMONARY Recent Labs  Lab 09/01/17 0834 09/01/17 1151 09/02/17 0342 09/03/17 0315  PHART 7.373 7.323* 7.307* 7.354  PCO2ART 49.3* 52.7* 50.9* 48.8*  PO2ART 161.0* 87.0 97.0 90.3  HCO3 28.7* 27.5 25.4 26.8  TCO2 30 29 27   --   O2SAT 99.0 96.0 97.0 97.3    CBC Recent Labs  Lab 09/07/17 0225 09/07/17 2022 09/08/17 0120  HGB 7.0* 6.5* 8.7*  HCT 20.9* 19.6* 28.0*  WBC 26.0* 21.1* 27.3*  PLT 192 226 154    COAGULATION Recent Labs  Lab 09/04/17 0923  INR 2.98    CARDIAC   No results for input(s): TROPONINI in the last 168 hours. No results for input(s): PROBNP in the last 168 hours.   CHEMISTRY Recent Labs  Lab 09/04/17 1542 09/05/17 0243 09/05/17 1707 09/06/17 0515 09/06/17 1619 09/07/17 0205 09/07/17 0720 09/07/17 1848  NA 134* 135  --  135  --  132*  --  132*  K 4.1 4.1  --  3.5  --  2.9*  --  3.9  CL 101 103  --  102  --  94*  --  93*  CO2 25 26  --  24  --  26  --  20*  GLUCOSE 108* 113*  --  94  --  129*  --  103*  BUN <5* 6  --  17  --  12  --  23*  CREATININE 1.32* 1.07*  --  2.33*  --  2.11*  --  3.18*  CALCIUM 8.2* 8.2*  --  8.6*  --  8.0*  --  8.4*  MG  --  2.2 2.5* 2.3 1.9 1.8 2.5*  --   PHOS 2.8 2.2* 2.2* 2.2* 4.3 3.7 4.1  --    Estimated Creatinine Clearance: 12 mL/min (A) (by C-G formula based on SCr of 3.18 mg/dL (H)).  I/O last 3 completed shifts: In: 770.3 [I.V.:610; NG/GT:110.3; IV Piggyback:50] Out: 063 [Drains:110; Other:664]   Intake/Output Summary (Last 24 hours) at 09/08/2017 0620 Last data filed at 09/08/2017 0030 Gross per 24 hour  Intake 420 ml  Output 60 ml  Net 360 ml     LIVER Recent Labs  Lab 09/02/17 0407  09/03/17 0355   09/04/17 0445 09/04/17 0923 09/04/17 1542 09/05/17 0243 09/06/17 0515 09/07/17 0205  AST 652*  --  315*  --  157*  --   --  113* 68*  --   ALT 515*  --  337*  --  234*  --   --  166* 112*  --   ALKPHOS 336*  --  442*  --  510*  --   --  478* 458*  --   BILITOT  4.2*  --  4.9*  --  4.6*  --   --  4.0* 3.5*  --   PROT 5.7*  --  5.8*  --  5.9*  --   --  5.7* 5.5*  --   ALBUMIN 1.8*  1.7*   < > 1.6*  1.7*   < > 1.7*  --  1.5* 1.6* 1.5* 1.5*  INR  --   --   --   --   --  2.98  --   --   --   --    < > = values in this interval not displayed.     INFECTIOUS No results for input(s): LATICACIDVEN, PROCALCITON in the last 168 hours.   ENDOCRINE CBG (last 3)  Recent Labs    09/08/17 0006 09/08/17 0051 09/08/17 0344  GLUCAP <10* 157* 84     IMAGING x48h  - image(s) personally visualized  -   highlighted in bold Dg Chest Port 1 View  Result Date: 09/07/2017 CLINICAL DATA:  Intubated. EXAM: PORTABLE CHEST 1 VIEW COMPARISON:  09/05/2017 FINDINGS: Endotracheal tube in satisfactory position. Nasogastric to side hole in the mid stomach, tip not included. The left jugular catheter has been removed. Normal sized heart. Mild patchy opacity in both lungs with improvement. Left breast surgical clips and cholecystectomy clips. Thoracic spine degenerative changes. IMPRESSION: Improving bilateral pneumonia. Electronically Signed   By: Claudie Revering M.D.   On: 09/07/2017 08:48     DISCUSSION: Ms. Arliene Rosenow is a 84 year old female with ESRD on MWF HD, HTN, Anemia, and hx of breast cancer who was admitted 4/4 with acute cholecystitis. S/p lap chole 4/6 complicated by post-op bile leak. Went for ERCP 4/11 however developed bilious emesis during prep, hypotension, and required intubation plus pressor support. Transferred to the ICU on 4/11.  ASSESSMENT / PLAN: PULMONARY A: Acute Respiratory Distress 2/2 Aspiration Requiring Mechanical Ventilation since 4/11 P:   Transition tidal volumes to 8  cc/kg on 4/19 Continue efforts at spontaneous breathing, pressure support trials.Did not tolerate this AM, became dysynchronus on the vent.  Completed Zosyn 4/19 CXR with improvement in bilateral pneumonia   CARDIOVASCULAR A:  #baseline Aortic Atherosclerosis on CXR RBBB - present prior to admission #current Septic Shock, resolved Troponin elevation (peak 4.47) 2/2 demand ischemia in setting of hypotensive event Echocardiogram w/ EF 65-70% Prolonged QTc LUE DVT - heparin started 4/19 Hypotension  P:  Remains off pressors. Ordered 500cc NS bolus. If no response will start neo.  Heparin held overnight in the setting of acute blood loss with Hgb 6.5  Continue aspirin as ordered Continue Midodrin, increase to 10 mg 4/20   RENAL A:   ESRD on MWF HD AVF LUE Hypokalemia Hypophosphatemia P:   Remittent HD as per nephrology plans Follow BMP Replace electrolytes as indicated  GASTROINTESTINAL A:   Acute Cholecystitis s/p lap chole 4/6 Bile leak s/p IR Rt abdominal fluid drain 4/11 - biloma by HIDA 4/14 Shock Liver, improved  nutrition SUP P:   Right abdominal fluid drain in place, continue to drain Appreciate surgery and gastroenterology assistance Plan for ERCP when the patient is more stable, likely next week Continue trickle tube feeds, do not advance at this time given her nausea, emesis Continue Protonix as ordered   HEMATOLOGIC A:   Hx of Breast Cancer - no acute issue Anemia of Chronic Disease -  S/p 2 units PRBCs 4/14 Leukocytosis Thrombocytopenia LUE (IJV) DVT Bleeding overnight - OG and GB tubes  and stool  P:  S/p 1u pRBC overnight and heparin held  HGB goal > 7.0 Follow CBC on heparin Bivalirudin stopped 4/20, heparin initiated after HIT antibody was negative Left IJ temporary HD catheter removed 4/19  INFECTIOUS A:   Septic Shock from Aspiration PNA Bile Leak s/p IR Biliary Drain 4/11 P:   Zosyn 4/12 >> 4/19  ENDOCRINE A:   Secondary  Hyperparathyroidism Hypoglycemia - CBGs in the 30s this AM, not tolerating tube feeds. Shock liver on presentation. Transaminases last checked 4/18 and trending down.  P:   KVO IV fluids, D5 Rechecking LFTs   NEUROLOGIC A:   Pain/Agitation Anisocoria  P:   RASS goal: 0 to -1 Fentanyl as needed Versed as needed Consider Head CT to evaluate for intracranial bleeding in the setting of acute blood loss requiring transfusion overnight    FAMILY  - Updates: Sons updated at bedside 4/17 and 4/19.    Welford Roche, MD  Internal Medicine PGY-1  P [phone]

## 2017-09-08 NOTE — Progress Notes (Signed)
Progress Note   Subjective  Patient had been doing better in recent days, developed some reported blood in the stools and out of NG overnight, however nursing states it did not appear to be significant. She has had worsening anemia on anticoagulation (left IJ DVT), leading to PRBC transfusion. Got bradycardic to 30s and hypotensive, now on pressors. Spoke with nurse, no further output per OG that has any blood in it, she has not had a BM since last night. Spoke with son at bedside. Acidosis noted. . Neurologic changes in pupils reported.   Objective   Vital signs in last 24 hours: Temp:  [93.4 F (34.1 C)-98.2 F (36.8 C)] 95.8 F (35.4 C) (04/21 1113) Pulse Rate:  [68-98] 68 (04/21 1113) Resp:  [18-41] 35 (04/21 1113) BP: (61-126)/(31-78) 106/51 (04/21 1126) SpO2:  [83 %-100 %] 100 % (04/21 1113) FiO2 (%):  [30 %-50 %] 50 % (04/21 1113) Weight:  [140 lb 10.5 oz (63.8 kg)] 140 lb 10.5 oz (63.8 kg) (04/21 0123) Last BM Date: 09/07/17 General:    AA female, intubated Heart:  Regular rate and rhythm; Lungs: intubated, course BS Abdomen:  Soft,non distended. Biliary drain with a small amount of old blood in it Extremities:  Edema of upper and lower extremities   Intake/Output from previous day: 04/20 0701 - 04/21 0700 In: 490 [I.V.:160; Blood:290; NG/GT:40] Out: 183 [Emesis/NG output:175] Intake/Output this shift: Total I/O In: 68.5 [I.V.:68.5] Out: -   Lab Results: Recent Labs    09/07/17 2022 09/08/17 0120 09/08/17 0655  WBC 21.1* 27.3* 19.5*  HGB 6.5* 8.7* 7.6*  HCT 19.6* 28.0* 23.1*  PLT 226 154 189   BMET Recent Labs    09/07/17 0205 09/07/17 1848 09/08/17 0655  NA 132* 132* 137  K 2.9* 3.9 4.5  CL 94* 93* 96*  CO2 26 20* 12*  GLUCOSE 129* 103* 35*  BUN 12 23* 29*  CREATININE 2.11* 3.18* 4.03*  CALCIUM 8.0* 8.4* 9.2   LFT Recent Labs    09/06/17 0515 09/07/17 0205  PROT 5.5*  --   ALBUMIN 1.5* 1.5*  AST 68*  --   ALT 112*  --   ALKPHOS  458*  --   BILITOT 3.5*  --   BILIDIR 1.8*  --   IBILI 1.7*  --    PT/INR No results for input(s): LABPROT, INR in the last 72 hours.  Studies/Results: Dg Chest Port 1 View  Result Date: 09/08/2017 CLINICAL DATA:  Acute respiratory failure. EXAM: PORTABLE CHEST 1 VIEW COMPARISON:  One day prior FINDINGS: Endotracheal tube terminates 2.5 cm above carina.Nasogastric tube extends beyond the inferior aspect of the film. Right upper quadrant surgical clips. Normal heart size for level of inspiration. Atherosclerosis in the transverse aorta. No pleural effusion or pneumothorax. Right mid lung and left base airspace disease is not significantly changed. No new pulmonary opacities. IMPRESSION: No change in multifocal bilateral airspace disease, most consistent with pneumonia. Aortic Atherosclerosis (ICD10-I70.0). Electronically Signed   By: Abigail Miyamoto M.D.   On: 09/08/2017 09:03   Dg Chest Port 1 View  Result Date: 09/07/2017 CLINICAL DATA:  Intubated. EXAM: PORTABLE CHEST 1 VIEW COMPARISON:  09/05/2017 FINDINGS: Endotracheal tube in satisfactory position. Nasogastric to side hole in the mid stomach, tip not included. The left jugular catheter has been removed. Normal sized heart. Mild patchy opacity in both lungs with improvement. Left breast surgical clips and cholecystectomy clips. Thoracic spine degenerative changes. IMPRESSION: Improving bilateral pneumonia. Electronically Signed  By: Claudie Revering M.D.   On: 09/07/2017 08:48       Assessment / Plan:    84 y/o female with ESRD s/p lap chole on 4/6 with subsequent bile leak. Developed respiratory failure prior to ERCP attempt, intubated on pressors, course complicated by NSTEMI due to demand ischemia and shock liver. Biliary drain in place, she has not been stable for ERCP. More recent course she has had IJ thrombosis, on anticoagulation.   She had been improving in recent days, however recently got hypotensive / bradycardic, worsening  acidosis, requiring pressor support this AM, also with neurologic changes / asymmetric pupils. Some blood noted in biliary drain (minimal in bag currently) and reported to be stool / out of OG last night, although I'm told this did not seem to be a significant amount of blood noted. No further bowel movements since last night, no blood out of OG currently.   Recommend starting IV protonix, 80mg  bolus, followed by 40mg  q 12 hours to treat any potential upper tract bleeding. She hasn't had any reported bleeding since last night, it would seem less likely that was responsible for her current instability. If she's bleeding to cause her current hypotension we should see it. Monitor closely, contact me with overt or significant bleeding if this develops to consider endoscopy, however given issues with acidosis / bradycardia / neurologic issues will monitor for now and continue supportive care.    Victoria Cellar, MD [facility] Gastroenter[facility]

## 2017-09-08 NOTE — Progress Notes (Signed)
ANTICOAGULATION CONSULT NOTE - Follow Up Consult  Pharmacy Consult for heparin Indication: DVT  Labs: Recent Labs    09/05/17 2004 09/06/17 0011  09/06/17 0515 09/06/17 1619 09/07/17 0205 09/07/17 0225 09/07/17 0720 09/07/17 1848 09/07/17 1850 09/07/17 2022 09/08/17 0120  HGB  --   --    < > 7.7* 7.5*  --  7.0*  --   --   --  6.5* 8.7*  HCT  --   --    < > 24.0* 22.8*  --  20.9*  --   --   --  19.6* 28.0*  PLT  --   --    < > 165 197  --  192  --   --   --  226 154  APTT 82* 82*  --  77*  --   --   --   --   --   --   --   --   HEPARINUNFRC  --   --   --   --  0.47  --   --  <0.10*  --  0.67  --   --   CREATININE  --   --   --  2.33*  --  2.11*  --   --  3.18*  --   --   --    < > = values in this interval not displayed.    Assessment/Plan:  Hgb up to 8.7 1h after transfusion but RN reports that pt continues to bleed from biliary drain and in stool as well as pink-tinged secretions.  Will continue to hold heparin for now and recheck Hgb w/ am labs prior to resuming heparin.  Wynona Neat, PharmD, BCPS  09/08/2017,2:44 AM

## 2017-09-08 NOTE — Progress Notes (Signed)
Louretta Parma, NP informed pt with hypotension, bloody stools biliary tube draining blood and ogt with pink tinge drainage. NP also informed pt currently does not have a central line. NP also informed pt with elevated wbc, course of antibiotics completed today.

## 2017-09-08 NOTE — Progress Notes (Signed)
Progress Note: General Surgery Service   Assessment/Plan: Patient Active Problem List   Diagnosis Date Noted  . Shock liver   . Anastomotic leak of biliary tree   . Malnutrition of moderate degree 08/30/2017  . Septic shock ([place])   . NSTEMI (non-ST elevated myocardial infarction) ([place])   . Acute respiratory failure with hypoxia ([place])   . Aspiration into airway   . ESRD on hemodialysis ([place])   . Bile leak   . Acute cholecystitis 08/22/2017  . Accelerated hypertension   . Hyperkalemia   . Syncope 09/14/2015  . Malignant neoplasm of upper-outer quadrant of left breast in female, estrogen receptor positive ([place]) 04/07/2013  . ESRD on dialysis ([place]) 04/07/2013  . Essential hypertension 04/07/2013  . Gout 04/07/2013  . GERD (gastroesophageal reflux disease) 04/07/2013   84 yo female s/p cholecystectomy with concern for bile leak. Bleeding overnight requiring unit, heparin now off, increasing leukocytosis and hypoglycemia, TF off due to emesis -continue abx -continue drain   LOS: 17 days  Chief Complaint/Subjective: Blood per rectum and noted in biliary bag, requiring dextrose for hypoglycemia  Objective: Vital signs in last 24 hours: Temp:  [93.4 F (34.1 C)-98.2 F (36.8 C)] 97.2 F (36.2 C) (04/21 0722) Pulse Rate:  [69-98] 73 (04/21 0700) Resp:  [18-41] 36 (04/21 0700) BP: (61-126)/(29-78) 92/42 (04/21 0700) SpO2:  [83 %-100 %] 99 % (04/21 0700) FiO2 (%):  [30 %] 30 % (04/21 0400) Weight:  [63.8 kg (140 lb 10.5 oz)] 63.8 kg (140 lb 10.5 oz) (04/21 0123) Last BM Date: 09/07/17  Intake/Output from previous day: 04/20 0701 - 04/21 0700 In: 490 [I.V.:160; Blood:290; NG/GT:40] Out: 183 [Emesis/NG output:175] Intake/Output this shift: No intake/output data recorded.  Lungs: assisted, coarse  Cardiovascular: RRR  Abd: soft, NT, ND, biliary drain with blood in bag  Extremities: no edema  Neuro: moves extremities  Lab Results: CBC  Recent Labs    09/07/17 2022  09/08/17 0120  WBC 21.1* 27.3*  HGB 6.5* 8.7*  HCT 19.6* 28.0*  PLT 226 154   BMET Recent Labs    09/07/17 0205 09/07/17 1848  NA 132* 132*  K 2.9* 3.9  CL 94* 93*  CO2 26 20*  GLUCOSE 129* 103*  BUN 12 23*  CREATININE 2.11* 3.18*  CALCIUM 8.0* 8.4*   PT/INR No results for input(s): LABPROT, INR in the last 72 hours. ABG No results for input(s): PHART, HCO3 in the last 72 hours.  Invalid input(s): PCO2, PO2  Studies/Results:  Anti-infectives: Anti-infectives (From admission, onward)   Start     Dose/Rate Route Frequency Ordered Stop   09/05/17 2000  piperacillin-tazobactam (ZOSYN) IVPB 3.375 g     3.375 g 12.5 mL/hr over 240 Minutes Intravenous Every 12 hours 09/05/17 1126 09/07/17 0154   08/31/17 0800  piperacillin-tazobactam (ZOSYN) IVPB 3.375 g  Status:  Discontinued     3.375 g 100 mL/hr over 30 Minutes Intravenous Every 6 hours 08/31/17 0739 09/05/17 1126   08/30/17 2000  Ampicillin-Sulbactam (UNASYN) 3 g in sodium chloride 0.9 % 100 mL IVPB  Status:  Discontinued     3 g 200 mL/hr over 30 Minutes Intravenous Every 24 hours 09/08/2017 0953 08/30/17 0945   08/30/17 1230  piperacillin-tazobactam (ZOSYN) IVPB 3.375 g  Status:  Discontinued     3.375 g 12.5 mL/hr over 240 Minutes Intravenous Every 12 hours 08/30/17 1134 08/31/17 0738   09/14/2017 1030  Ampicillin-Sulbactam (UNASYN) 3 g in sodium chloride 0.9 % 100 mL IVPB  3 g 200 mL/hr over 30 Minutes Intravenous  Once 09/08/2017 0953 08/30/2017 1246   08/22/2017 0000  ampicillin-sulbactam (UNASYN) 1.5 g in sodium chloride 0.9 % 100 mL IVPB  Status:  Discontinued     1.5 g 200 mL/hr over 30 Minutes Intravenous Once 08/28/17 1045 09/07/2017 0928   09/04/2017 2200  piperacillin-tazobactam (ZOSYN) IVPB 3.375 g    Note to Pharmacy:  Zosyn 3.375 g IV q12h in ESRD on HD   3.375 g 12.5 mL/hr over 240 Minutes Intravenous Every 12 hours 09/17/2017 1155 08/25/17 1335   08/22/17 1000  piperacillin-tazobactam (ZOSYN) IVPB 3.375 g   Status:  Discontinued    Note to Pharmacy:  Zosyn 3.375 g IV q12h in ESRD on HD   3.375 g 12.5 mL/hr over 240 Minutes Intravenous Every 12 hours 08/22/17 0128 09/16/2017 1155   08/22/17 0045  piperacillin-tazobactam (ZOSYN) IVPB 3.375 g     3.375 g 12.5 mL/hr over 240 Minutes Intravenous  Once 08/22/17 0038 08/22/17 0145      Medications: Scheduled Meds: . aspirin  81 mg Per Tube Daily  . chlorhexidine gluconate (MEDLINE KIT)  15 mL Mouth Rinse BID  . Chlorhexidine Gluconate Cloth  6 each Topical Daily  . darbepoetin (ARANESP) injection - DIALYSIS  150 mcg Intravenous Q Mon-HD  . doxercalciferol  2 mcg Intravenous Q M,W,F-HD  . hydrogen peroxide 1/2 strength irrigation   Irrigation BID  . insulin aspart  0-9 Units Subcutaneous Q4H  . mouth rinse  15 mL Mouth Rinse 10 times per day  . midodrine  10 mg Per Tube TID WC  . pantoprazole sodium  40 mg Per Tube Daily  . sodium chloride flush  10-40 mL Intracatheter Q12H   Continuous Infusions: . sodium chloride 10 mL/hr at 09/05/17 1800  . sodium chloride    . sodium chloride    . dextrose 10 mL/hr at 09/07/17 1111  . feeding supplement (VITAL AF 1.2 CAL) Stopped (09/07/17 0529)   PRN Meds:.acetaminophen **OR** acetaminophen, albuterol, docusate, fentaNYL (SUBLIMAZE) injection, midazolam, promethazine **OR** promethazine  Mickeal Skinner, MD Pg# [phone] [facility]

## 2017-09-08 NOTE — Progress Notes (Signed)
RT came to patient room due to patient sats reading 78% with a good waveform.  Gave patient O2 breath and lavaged patient and was able to obtain a moderate amount of thick, white secretions.  Adjusted pulse ox on patient's forehead and reading increased to 100%.  Pulse ox then began to read low 90s.  Patient currently dyssynchronous with ventilator.  Will speak with MD and continue to monitor.

## 2017-09-08 NOTE — Progress Notes (Signed)
Spoke with IR, Dr. Laurence Ferrari, tonight about patient's recent CT abdomen results. Feel that another drain placement would be beneficial. IR will evaluate the patient tomorrow morning. Have placed an order for CT guided drainage with orders for cultures. Will initiate antibiotic coverage for anaerobic organisms.   Plan: - IR to evaluate tomorrow - CT guided drainage ordered  - Leave patient NPO  - Start Zosyn

## 2017-09-08 NOTE — Progress Notes (Signed)
Upon assessment pt did not respond "as before", per son, who is at the bedside. Pupils are not equal, Lt pupil sluggish, does not follow commands. Blood sugar in 30. 2 amps of Dex 50 were given  MD was called at the bedside for the assessment. Pt was tachy, accessory muscles in use. BP low. ABG was taken and read by MD. Pt became brady in 30s. 2 amps of Bicarb given. Bicarb drip ordered. Levophed started. Blood ordered and started.  At this time, bicarb is on hold due to incapability of Levo and Bicarb. Only 2 peripheral. RT Pupil more dilated and non reactive. MD at the bedside, talking to the patient's son. Will do full measures, and in case cardiac arrest, will do one round of CPR, per son. Unable to go to CT due to instability.Levo at 6, blood going. Continue to monitor.A Another son was called and updated by MD and coming to the hospital.

## 2017-09-08 NOTE — Progress Notes (Addendum)
[facility] Progress Note   Subjective:  Alert, on the vent  Objective Vitals:   09/08/17 0600 09/08/17 0630 09/08/17 0700 09/08/17 0722  BP: (!) 113/46 (!) 99/58 (!) 92/42   Pulse: 73  73   Resp: (!) 22 (!) 28 (!) 36   Temp:    (!) 97.2 F (36.2 C)  TempSrc:    Oral  SpO2: 99%  99%   Weight:      Height:       Physical Exam General: eyes open, intubated, follows commands Heart:tachycardia, RR, 2/6 systolic murmur. No rub or gallop Lungs:CTAB  Abdomen:soft, NTND, biliary drain in place Extremities: 1+ edema UE's / LE's, 3+ LUE edema Dialysis Access: LUA AVG +b/t , L IJ temp cath running CRRT   Dialysis Orders: MWF [facility] 3h 57mn 400/A1.5 61.5kg 2/2.25 bath P4 AVG LUE Hep 4000 - Hectoral 289m IV q HD - Mircera 3074mIV q 2 weeks (last 3/27)   Assessment/Plan: 1 Acute cholecystitis - s/p lap chole 4/6. Bile duct leak sp IR biliary drain 4/11/  2 Acute resp failure/ asp PNA - vdrf, dense infiltrates by CXR remain, on zosyn. Failure to wean 3 Septic shock - resolved  4 ESRD - usual HD is MWF.  CRRT 4/13 > 4/18, had regular HD Fri using L arm AVG w/o difficulty.  Temp cath dc'd.  Next HD Monday.  If pt needs TNA will probably have to go back to CRRT for vol control.   5 Anemia of CKD- Hgb low. Last darbe 150 ug on 4/15.  SP prbc's Hb is up to 10.  6  MBD ckd - stable 7 VDRF - on vent support, per CCM 8 Volume - mild/ mod vol excess/ low alb/ no edema CXRs.  Will need lower edw given prolonged npo status. Not tolerating UF on HD which is not surprising given low alb 1.5/ recent sepsis.   9 LUE DVT - acute L IJ DVT, anticoag per primary team.  Cath dc'd.   RobKelly Splinter [employer] ([phone]/20/2019, 3:44 PM      Filed Weights   09/06/17 1107 09/07/17 0500 09/08/17 0123  Weight: 64.2 kg (141 lb 8.6 oz) 63.6 kg (140 lb 3.4 oz) 63.8 kg (140 lb 10.5 oz)    Intake/Output Summary (Last 24 hours) at 09/08/2017  0815 Last data filed at 09/08/2017 0600 Gross per 24 hour  Intake 490 ml  Output 183 ml  Net 307 ml    Additional Objective Labs: Basic Metabolic Panel: Recent Labs  Lab 09/06/17 0515 09/06/17 1619 09/07/17 0205 09/07/17 0720 09/07/17 1848  NA 135  --  132*  --  132*  K 3.5  --  2.9*  --  3.9  CL 102  --  94*  --  93*  CO2 24  --  26  --  20*  GLUCOSE 94  --  129*  --  103*  BUN 17  --  12  --  23*  CREATININE 2.33*  --  2.11*  --  3.18*  CALCIUM 8.6*  --  8.0*  --  8.4*  PHOS 2.2* 4.3 3.7 4.1  --    Liver Function Tests: Recent Labs  Lab 09/04/17 0445  09/05/17 0243 09/06/17 0515 09/07/17 0205  AST 157*  --  113* 68*  --   ALT 234*  --  166* 112*  --   ALKPHOS 510*  --  478* 458*  --  BILITOT 4.6*  --  4.0* 3.5*  --   PROT 5.9*  --  5.7* 5.5*  --   ALBUMIN 1.7*   < > 1.6* 1.5* 1.5*   < > = values in this interval not displayed.   No results for input(s): LIPASE, AMYLASE in the last 168 hours. CBC: Recent Labs  Lab 09/06/17 0515 09/06/17 1619 09/07/17 0225 09/07/17 2022 09/08/17 0120  WBC 23.9* 27.9* 26.0* 21.1* 27.3*  HGB 7.7* 7.5* 7.0* 6.5* 8.7*  HCT 24.0* 22.8* 20.9* 19.6* 28.0*  MCV 88.9 88.0 86.7 88.7 93.6  PLT 165 197 192 226 154   Blood Culture    Component Value Date/Time   SDES ABDOMEN BILE 09/02/2017 1405   SPECREQUEST NONE 09/02/2017 1405   CULT  09/02/2017 1405    NO GROWTH 3 DAYS Performed at [facility], [place] [address], [place], [place] [address]    REPTSTATUS 09/05/2017 FINAL 09/02/2017 1405    CBG: Recent Labs  Lab 09/07/17 1514 09/07/17 1928 09/08/17 0006 09/08/17 0051 09/08/17 0344  GLUCAP 111* 74 <10* 157* 84    Lab Results  Component Value Date   INR 2.98 09/04/2017   INR 1.53 08/28/2017   INR 1.34 04/10/2010   Studies/Results:  Medications: . sodium chloride 10 mL/hr at 09/05/17 1800  . sodium chloride    . sodium chloride    . dextrose 10 mL/hr at 09/07/17 1111  . feeding supplement (VITAL AF  1.2 CAL) Stopped (09/07/17 0529)   . aspirin  81 mg Per Tube Daily  . chlorhexidine gluconate (MEDLINE KIT)  15 mL Mouth Rinse BID  . Chlorhexidine Gluconate Cloth  6 each Topical Daily  . darbepoetin (ARANESP) injection - DIALYSIS  150 mcg Intravenous Q Mon-HD  . doxercalciferol  2 mcg Intravenous Q M,W,F-HD  . hydrogen peroxide 1/2 strength irrigation   Irrigation BID  . insulin aspart  0-9 Units Subcutaneous Q4H  . mouth rinse  15 mL Mouth Rinse 10 times per day  . midodrine  10 mg Per Tube TID WC  . pantoprazole sodium  40 mg Per Tube Daily  . sodium chloride flush  10-40 mL Intracatheter Q12H

## 2017-09-08 NOTE — Progress Notes (Signed)
Spoke with Dr Jonnie Finner re TNA via HD cath pigtail.  Orders ok to use pigtail for TNA.  RN notified of new order.  Levophed also infusing via same line.  RN to call MD re CVC placement to accommodate infusion needs.

## 2017-09-08 NOTE — Progress Notes (Signed)
PHARMACY - ADULT TOTAL PARENTERAL NUTRITION CONSULT NOTE   Pharmacy Consult:  TPN Indication: Intolerance to enteral nutrition  Patient Measurements: Height: 5\' 2"  (157.5 cm) Weight: 140 lb 10.5 oz (63.8 kg) IBW/kg (Calculated) : 50.1 TPN AdjBW (KG): 61.7 Body mass index is 25.73 kg/m.  Assessment:  89 YOF presented on 09/04/2017 with abdominal pain and vomiting, found to have cholecystitis and she underwent cholecystomy on 08/22/2017.  She was on a clear liquid diet before and after surgery, but did not tolerate.  Imaging showed bile leak with fluid collection around the liver, and patient aspirated prior to planned ERCP on.  She was started on TF on 09/05/17 but did not tolerate and retried on 09/06/17 without success.  Pharmacy consulted to initiate TPN for nutritional support.  Patient has moderate malnutrition and is at risk for refeeding syndrome.  GI: NG O/P 152mL, drain O/P 70mL.  Pepcid IV Endo: hypoglycemic prior to TPN Insulin requirements in the past 24 hours: N/A Lytes: low CL/CO2, Mag slightly elevated at 2.8, CoCa elevated at 11.36 Renal: ESRD on MWF HD. CRRT 4/13 >> 4/18, resume iHD on 4/19.  BUN 29, off bicarb gtt.  Hectorol Pulm: intubated - FiO2 40% Cards: HTN, s/p NSTEMI.  MAP 50-60s, midodrine AC: Heparin for LUE DVT - hgb 7.6, plts WNL - Aranesp Hepatobil: ERCP when stable.  LFTs elevated, tbili 4 (no jaundice per RN) Neuro: sedated - GCS 13, CPOT 0-2, RASS -1 ID: s/p Zosyn for PNA/bile leak - afebrile, WBC down to 19.5 TPN Access: CCM working on access TPN start date: 09/08/17  Nutritional Goals (per RD rec on 09/05/17): 1350 kCal and 80-95gm protein per day  Current Nutrition:  NPO   Plan:  After access established, start concentrated TPN at 20 ml/hr (goal ~50 ml/hr) TPN will provide 36g AA and 115gm CHO, which equals to 536 kCal, meeting ~40% of patient's needs. Hold ILE for the first 7 days of TPN per ASPEN/SCCM guidelines (start date 09/15/17) Electrolytes in  TPN: given low TPN rate with risk of refeeding, standard lytes except no calcium, Cl:Ac 1:2 Daily multivitamin and trace elements in TPN.  Monitor for jaundice, tbili trend. Continue sensitive SSI Q4H Standard TPN labs and nursing care orders   Thomasenia Dowse D. Mina Marble, PharmD, BCPS, BCCCP Pager:  [phone] 09/08/2017, 12:45 PM

## 2017-09-08 NOTE — Progress Notes (Signed)
Pt transported to CT via vent with no complications noted. Pt returned to ICU & RT notified of her return.

## 2017-09-08 NOTE — Progress Notes (Signed)
This morning at around 10AM patient became bradycardic with HR 30-40s and hypotensive to 60/40s. She is also not tolerating tube feeds due to ongoing emesis and is also hypoglycemic to the 30s requiring several amps of D50. In addition, she did not tolerate SBT today. Her exam is unchanged from earlier this morning. Remains somnolent and does not arouses to voice or follows commands. Blood work now shows Hgb 7.6 from 8.7 this AM, bicarb 12, ABG with pH 7.25.   Received an amp of bicarb during our examination with improvement in heart rate.   Overall, patient's clinical status deteriorating.  - Giving 1L NS bolus  - Starting levophed  - Starting bicarb gtt and NS @ 75cc/h   - Might need line placement for CRRT  - Stop tube feeds and start TPN  - Transfusing another unit of blood   Family updated.

## 2017-09-08 NOTE — Progress Notes (Signed)
Discussed case w/ primary team and also with IV team.  Now back on pressors and starting on TNA, will need CRRT.  Orders written, keep even, no heparin, usual fluids.  Also gave OK to use the pigtail for TNA in the short-term, but if still on TNA in a few days would recommend another central line.    Kelly Splinter MD [employer] 09/08/2017, 5:30 PM

## 2017-09-09 DIAGNOSIS — D689 Coagulation defect, unspecified: Secondary | ICD-10-CM

## 2017-09-09 LAB — BASIC METABOLIC PANEL
ANION GAP: 16 — AB (ref 5–15)
BUN: 22 mg/dL — ABNORMAL HIGH (ref 6–20)
CALCIUM: 8 mg/dL — AB (ref 8.9–10.3)
CHLORIDE: 99 mmol/L — AB (ref 101–111)
CO2: 24 mmol/L (ref 22–32)
Creatinine, Ser: 2.6 mg/dL — ABNORMAL HIGH (ref 0.44–1.00)
GFR calc non Af Amer: 16 mL/min — ABNORMAL LOW (ref >60)
GFR, EST AFRICAN AMERICAN: 19 mL/min — AB (ref >60)
Glucose, Bld: 135 mg/dL — ABNORMAL HIGH (ref 65–99)
Potassium: 3.5 mmol/L (ref 3.5–5.1)
SODIUM: 139 mmol/L (ref 135–145)

## 2017-09-09 LAB — GLUCOSE, CAPILLARY
GLUCOSE-CAPILLARY: 120 mg/dL — AB (ref 65–99)
GLUCOSE-CAPILLARY: 98 mg/dL (ref 65–99)
GLUCOSE-CAPILLARY: 127 mg/dL — AB (ref 65–99)
GLUCOSE-CAPILLARY: 112 mg/dL — AB (ref 65–99)
GLUCOSE-CAPILLARY: 102 mg/dL — AB (ref 65–99)
Glucose-Capillary: 61 mg/dL — ABNORMAL LOW (ref 65–99)
Glucose-Capillary: 106 mg/dL — ABNORMAL HIGH (ref 65–99)

## 2017-09-09 LAB — DIFFERENTIAL
BAND NEUTROPHILS: 1 %
BASOS PCT: 0 %
BLASTS: 0 %
Basophils Absolute: 0 10*3/uL (ref 0.0–0.1)
EOS PCT: 0 %
Eosinophils Absolute: 0 10*3/uL (ref 0.0–0.7)
LYMPHS PCT: 11 %
Lymphs Abs: 1.8 10*3/uL (ref 0.7–4.0)
MONOS PCT: 2 %
Metamyelocytes Relative: 1 %
Monocytes Absolute: 0.3 10*3/uL (ref 0.1–1.0)
Myelocytes: 0 %
NEUTROS ABS: 14.3 10*3/uL — AB (ref 1.7–7.7)
NEUTROS PCT: 85 %
OTHER: 0 %
Promyelocytes Relative: 0 %
nRBC: 0 /100 WBC

## 2017-09-09 LAB — PREALBUMIN: Prealbumin: <5 mg/dL — ABNORMAL LOW (ref 18–38)

## 2017-09-09 LAB — POCT I-STAT 3, ART BLOOD GAS (G3+)
Acid-base deficit: 10 mmol/L — ABNORMAL HIGH (ref 0.0–2.0)
BICARBONATE: 15.1 mmol/L — AB (ref 20.0–28.0)
O2 Saturation: 99 %
TCO2: 16 mmol/L — AB (ref 22–32)
pCO2 arterial: 28.4 mmHg — ABNORMAL LOW (ref 32.0–48.0)
pH, Arterial: 7.33 — ABNORMAL LOW (ref 7.350–7.450)
pO2, Arterial: 118 mmHg — ABNORMAL HIGH (ref 83.0–108.0)

## 2017-09-09 LAB — COMPREHENSIVE METABOLIC PANEL
ALK PHOS: 484 U/L — AB (ref 38–126)
ALT: 613 U/L — AB (ref 14–54)
AST: 2141 U/L — AB (ref 15–41)
Albumin: 1.7 g/dL — ABNORMAL LOW (ref 3.5–5.0)
Anion gap: 17 — ABNORMAL HIGH (ref 5–15)
BUN: 17 mg/dL (ref 6–20)
CHLORIDE: 96 mmol/L — AB (ref 101–111)
CO2: 22 mmol/L (ref 22–32)
CREATININE: 2.21 mg/dL — AB (ref 0.44–1.00)
Calcium: 7.8 mg/dL — ABNORMAL LOW (ref 8.9–10.3)
GFR calc Af Amer: 23 mL/min — ABNORMAL LOW (ref >60)
GFR, EST NON AFRICAN AMERICAN: 20 mL/min — AB (ref >60)
Glucose, Bld: 101 mg/dL — ABNORMAL HIGH (ref 65–99)
Potassium: 3.7 mmol/L (ref 3.5–5.1)
SODIUM: 135 mmol/L (ref 135–145)
Total Bilirubin: 5.3 mg/dL — ABNORMAL HIGH (ref 0.3–1.2)
Total Protein: 5.1 g/dL — ABNORMAL LOW (ref 6.5–8.1)

## 2017-09-09 LAB — PHOSPHORUS: Phosphorus: 3.5 mg/dL (ref 2.5–4.6)

## 2017-09-09 LAB — PROTIME-INR
INR: 8.3
INR: 2.14
Prothrombin Time: 68.5 seconds — ABNORMAL HIGH (ref 11.4–15.2)
Prothrombin Time: 23.7 seconds — ABNORMAL HIGH (ref 11.4–15.2)

## 2017-09-09 LAB — RENAL FUNCTION PANEL
Albumin: 2.3 g/dL — ABNORMAL LOW (ref 3.5–5.0)
Anion gap: 19 — ABNORMAL HIGH (ref 5–15)
BUN: 9 mg/dL (ref 6–20)
CALCIUM: 8.4 mg/dL — AB (ref 8.9–10.3)
CO2: 19 mmol/L — AB (ref 22–32)
CREATININE: 1.43 mg/dL — AB (ref 0.44–1.00)
Chloride: 98 mmol/L — ABNORMAL LOW (ref 101–111)
GFR, EST AFRICAN AMERICAN: 38 mL/min — AB (ref >60)
GFR, EST NON AFRICAN AMERICAN: 33 mL/min — AB (ref >60)
Glucose, Bld: 113 mg/dL — ABNORMAL HIGH (ref 65–99)
Phosphorus: 3.2 mg/dL (ref 2.5–4.6)
Potassium: 4.3 mmol/L (ref 3.5–5.1)
SODIUM: 136 mmol/L (ref 135–145)

## 2017-09-09 LAB — HEPARIN LEVEL (UNFRACTIONATED)

## 2017-09-09 LAB — CBC
HEMATOCRIT: 25.7 % — AB (ref 36.0–46.0)
HEMOGLOBIN: 9 g/dL — AB (ref 12.0–15.0)
MCH: 29.3 pg (ref 26.0–34.0)
MCHC: 35 g/dL (ref 30.0–36.0)
MCV: 83.7 fL (ref 78.0–100.0)
Platelets: 164 10*3/uL (ref 150–400)
RBC: 3.07 MIL/uL — AB (ref 3.87–5.11)
RDW: 16.4 % — ABNORMAL HIGH (ref 11.5–15.5)
WBC: 16.4 10*3/uL — ABNORMAL HIGH (ref 4.0–10.5)

## 2017-09-09 LAB — APTT: aPTT: 53 seconds — ABNORMAL HIGH (ref 24–36)

## 2017-09-09 LAB — TRIGLYCERIDES: Triglycerides: 105 mg/dL (ref <150)

## 2017-09-09 LAB — PATHOLOGIST SMEAR REVIEW

## 2017-09-09 LAB — MAGNESIUM: Magnesium: 2.4 mg/dL (ref 1.7–2.4)

## 2017-09-09 LAB — TROPONIN I: Troponin I: 0.49 ng/mL (ref <0.03)

## 2017-09-09 MED ORDER — DEXTROSE 10 % IV SOLN
INTRAVENOUS | Status: DC
  Administered 2017-09-09 – 2017-09-10 (×2): via INTRAVENOUS

## 2017-09-09 MED ORDER — SODIUM CHLORIDE 0.9 % IV SOLN
Freq: Once | INTRAVENOUS | Status: AC
  Administered 2017-09-09: 11:00:00 via INTRAVENOUS

## 2017-09-09 MED ORDER — VITAMIN K1 10 MG/ML IJ SOLN
10.0000 mg | Freq: Every day | INTRAVENOUS | Status: AC
  Administered 2017-09-09 – 2017-09-11 (×3): 10 mg via INTRAVENOUS
  Filled 2017-09-09 (×6): qty 1

## 2017-09-09 MED ORDER — SODIUM CHLORIDE 0.9 % IV SOLN
Freq: Once | INTRAVENOUS | Status: AC
  Administered 2017-09-09: 19:00:00 via INTRAVENOUS

## 2017-09-09 NOTE — Progress Notes (Addendum)
[facility] Progress Note  11 Days Post-Op  Subjective: CC: sepsis Became brady and hypotensive yesterday - HR improved with bicarb Patient remains on the ventilator and on levo for pressure support. S/p 2 units PRBC yesterday for ABL anemia, heparin stopped. CT yesterday showed growth of perihepatic and perisplenic fluid collections - IR consulted. Patient had large bloody BM yesterday evening and another small one this AM.  Minimal output from RUQ drain.  Objective: Vital signs in last 24 hours: Temp:  [94.3 F (34.6 C)-97.5 F (36.4 C)] 97.3 F (36.3 C) (04/22 0808) Pulse Rate:  [65-85] 74 (04/22 0750) Resp:  [17-50] 31 (04/22 0750) BP: (61-153)/(32-116) 105/57 (04/22 0750) SpO2:  [88 %-100 %] 100 % (04/22 0750) FiO2 (%):  [40 %-50 %] 40 % (04/22 0700) Weight:  [64.8 kg (142 lb 13.7 oz)] 64.8 kg (142 lb 13.7 oz) (04/22 0300) Last BM Date: 09/09/17  Intake/Output from previous day: 04/21 0701 - 04/22 0700 In: 2838.4 [I.V.:2154.4; Blood:454; NG/GT:60; IV Piggyback:150] Out: 1431 [Emesis/NG output:50] Intake/Output this shift: Total I/O In: 122.6 [I.V.:122.6] Out: 168 [Other:168]  PE: Gen: on the vent, somnolent  ENT: sclera icteric bilaterally  Card:  Regular rate and irregular rhythm, pedal pulses 2+ BL Pulm:  Slightly tachypneic, inspiratory crackles in bases bilaterally  Abd: Soft, non-distended, bowel sounds hypoactive, no HSM, incisions C/D/I, drain in RUQ with minimal bloody bile tinged drainage Skin: warm and dry, no rashes    Lab Results:  Recent Labs    09/08/17 1936 09/09/17 0400  WBC 24.9* 16.4*  HGB 7.7* 9.0*  HCT 22.1* 25.7*  PLT 147* 164   BMET Recent Labs    09/08/17 2345 09/09/17 0400  NA 139 135  K 3.5 3.7  CL 99* 96*  CO2 24 22  GLUCOSE 135* 101*  BUN 22* 17  CREATININE 2.60* 2.21*  CALCIUM 8.0* 7.8*   PT/INR Recent Labs    09/09/17 0636  LABPROT 68.5*  INR 8.30*   CMP     Component Value Date/Time   NA 135  09/09/2017 0400   NA 142 02/12/2017 1400   K 3.7 09/09/2017 0400   K 3.9 02/12/2017 1400   CL 96 (L) 09/09/2017 0400   CO2 22 09/09/2017 0400   CO2 30 (H) 02/12/2017 1400   GLUCOSE 101 (H) 09/09/2017 0400   GLUCOSE 88 02/12/2017 1400   BUN 17 09/09/2017 0400   BUN 16.9 02/12/2017 1400   CREATININE 2.21 (H) 09/09/2017 0400   CREATININE 6.2 (HH) 02/12/2017 1400   CALCIUM 7.8 (L) 09/09/2017 0400   CALCIUM 9.2 02/12/2017 1400   PROT 5.1 (L) 09/09/2017 0400   PROT 6.8 02/12/2017 1400   ALBUMIN 1.7 (L) 09/09/2017 0400   ALBUMIN 3.3 (L) 02/12/2017 1400   AST 2,141 (H) 09/09/2017 0400   AST 13 02/12/2017 1400   ALT 613 (H) 09/09/2017 0400   ALT 7 02/12/2017 1400   ALKPHOS 484 (H) 09/09/2017 0400   ALKPHOS 75 02/12/2017 1400   BILITOT 5.3 (H) 09/09/2017 0400   BILITOT 0.68 02/12/2017 1400   GFRNONAA 20 (L) 09/09/2017 0400   GFRAA 23 (L) 09/09/2017 0400   Lipase     Component Value Date/Time   LIPASE 36 09/04/2017 1618       Studies/Results: Ct Abdomen Pelvis Wo Contrast  Result Date: 09/08/2017 CLINICAL DATA:  Elevated lactic acid. Bloody stools. Status post cholecystectomy August 24, 2017. History of breast cancer, end-stage renal disease on dialysis. EXAM: CT ABDOMEN AND PELVIS WITHOUT CONTRAST  TECHNIQUE: Multidetector CT imaging of the abdomen and pelvis was performed following the standard protocol without IV contrast. COMPARISON:  CT abdomen and pelvis September 03, 2017. FINDINGS: LOWER CHEST: Multifocal lung base consolidation improved from prior examination. Heart size is normal. Moderate coronary artery calcifications. No pericardial effusion. HEPATOBILIARY: Nodular hepatic contour. Marked interval increase in size large perihepatic heterogeneous fluid collection largest component measuring 16.7 x 6.8 cm (24 Hounsfield units). Fluid collection is contiguous with gallbladder fossa, status post cholecystectomy. PANCREAS: Normal. SPLEEN: Large perisplenic fluid collection.  ADRENALS/URINARY TRACT: Kidneys are orthotopic, extremely atrophic. Bilateral renal cysts measuring to 16 mm on the RIGHT. No nephrolithiasis, hydronephrosis; limited assessment for renal masses on this nonenhanced examination. The unopacified ureters are normal in course and caliber. Urinary bladder is partially distended and unremarkable. Normal adrenal glands. STOMACH/BOWEL: Nasogastric tube tip terminates in mid stomach. The stomach, small and large bowel are normal in course and caliber without inflammatory changes, sensitivity decreased by lack of enteric contrast. Moderate colonic diverticulosis. VASCULAR/LYMPHATIC: Aortoiliac vessels are normal in course and caliber. Mild calcific atherosclerosis. LEFT femoral central venous catheter placed. REPRODUCTIVE: Status post hysterectomy. 10 mm LEFT adnexal cyst, no routine indicated follow-up. OTHER: RIGHT upper quadrant pigtail drainage CT catheter posteriorly abutting and possibly within the hepatic fluid collection. Multiple heterogeneous intraperitoneal fluid collections along the anterior abdominal wall largest measuring 13.4 x 5.6 cm which appears to communicate with the hepatic collection. Small volume ascites within pelvis. MUSCULOSKELETAL: Non-acute. Osteopenia. Multiple old mild compression fractures and old Schmorl's nodes. Anterior abdominal wall scarring. IMPRESSION: 1. Marked interval growth of large perihepatic and perisplenic fluid collections with multiple new intraperitoneal fluid collections compatible with abscess, less likely hematoma. RIGHT pigtail drainage catheter abutting perihepatic fluid collection. 2. Status post cholecystectomy. 3. Resolving multifocal pneumonia. 4. These results will be called to the ordering clinician or representative by the Radiologist Assistant, and communication documented in the PACS or zVision Dashboard. Electronically Signed   By: Elon Alas M.D.   On: 09/08/2017 18:57   Dg Chest Port 1 View  Result  Date: 09/08/2017 CLINICAL DATA:  Acute respiratory failure. EXAM: PORTABLE CHEST 1 VIEW COMPARISON:  One day prior FINDINGS: Endotracheal tube terminates 2.5 cm above carina.Nasogastric tube extends beyond the inferior aspect of the film. Right upper quadrant surgical clips. Normal heart size for level of inspiration. Atherosclerosis in the transverse aorta. No pleural effusion or pneumothorax. Right mid lung and left base airspace disease is not significantly changed. No new pulmonary opacities. IMPRESSION: No change in multifocal bilateral airspace disease, most consistent with pneumonia. Aortic Atherosclerosis (ICD10-I70.0). Electronically Signed   By: Abigail Miyamoto M.D.   On: 09/08/2017 09:03    Anti-infectives: Anti-infectives (From admission, onward)   Start     Dose/Rate Route Frequency Ordered Stop   09/08/17 2000  piperacillin-tazobactam (ZOSYN) IVPB 3.375 g     3.375 g 100 mL/hr over 30 Minutes Intravenous Every 6 hours 09/08/17 1946     09/05/17 2000  piperacillin-tazobactam (ZOSYN) IVPB 3.375 g     3.375 g 12.5 mL/hr over 240 Minutes Intravenous Every 12 hours 09/05/17 1126 09/07/17 0154   08/31/17 0800  piperacillin-tazobactam (ZOSYN) IVPB 3.375 g  Status:  Discontinued     3.375 g 100 mL/hr over 30 Minutes Intravenous Every 6 hours 08/31/17 0739 09/05/17 1126   08/30/17 2000  Ampicillin-Sulbactam (UNASYN) 3 g in sodium chloride 0.9 % 100 mL IVPB  Status:  Discontinued     3 g 200 mL/hr over 30 Minutes Intravenous  Every 24 hours 08/25/2017 0953 08/30/17 0945   08/30/17 1230  piperacillin-tazobactam (ZOSYN) IVPB 3.375 g  Status:  Discontinued     3.375 g 12.5 mL/hr over 240 Minutes Intravenous Every 12 hours 08/30/17 1134 08/31/17 0738   09/01/2017 1030  Ampicillin-Sulbactam (UNASYN) 3 g in sodium chloride 0.9 % 100 mL IVPB     3 g 200 mL/hr over 30 Minutes Intravenous  Once 09/03/2017 0953 08/22/2017 1246   09/17/2017 0000  ampicillin-sulbactam (UNASYN) 1.5 g in sodium chloride 0.9 % 100 mL  IVPB  Status:  Discontinued     1.5 g 200 mL/hr over 30 Minutes Intravenous Once 08/28/17 1045 08/28/2017 0928   09/16/2017 2200  piperacillin-tazobactam (ZOSYN) IVPB 3.375 g    Note to Pharmacy:  Zosyn 3.375 g IV q12h in ESRD on HD   3.375 g 12.5 mL/hr over 240 Minutes Intravenous Every 12 hours 09/13/2017 1155 08/25/17 1335   08/22/17 1000  piperacillin-tazobactam (ZOSYN) IVPB 3.375 g  Status:  Discontinued    Note to Pharmacy:  Zosyn 3.375 g IV q12h in ESRD on HD   3.375 g 12.5 mL/hr over 240 Minutes Intravenous Every 12 hours 08/22/17 0128 09/07/2017 1155   08/22/17 0045  piperacillin-tazobactam (ZOSYN) IVPB 3.375 g     3.375 g 12.5 mL/hr over 240 Minutes Intravenous  Once 08/22/17 0038 08/22/17 0145       Assessment/Plan HTN ESRD - HD MWF H/o breast cancer  Acute cholecystitis POD13s/p lap chole4/6/19 by Dr. Redmond Pulling - CT scan 4/8 w/ small amount fluid around liver and layering in pelvis ; HIDA 4/9 positive for bile leak - CT 4/21 with multiple fluid collections - IR to evaluate today for possible drain placement Shock liver -LFTs up from yesterday - AST 2,141, ALT 613, Tbili 5.3  VDRF secondary to aspiration/PNA -CCMfollowing - appreciate assistance.   Leukocytosis Likely secondary to aspiration PNA and bile leak (which is fairly well controlled with her currently drain) On Zosyn. WBC down to 16 today  ABL Anemia - Hgb 9.0, s/p 2 units PRBC yesterday - INR 8, heparin being held - large bloody stool yesterday  Lactic acidosis - on bicarb drip, appreciate CCM assistance   ID -zosyn 4/4>>4/7,Unasyn 4/11-->4/12, Zosyn 4/12 --> FEN -NPO/OGT- TF held yesterday for emesis  VTE -SCDs Foley -none, anuric Follow up -Dr. Redmond Pulling  Plan- IR to evaluate for possible drain placement today. GI to reevaluate this week. Continue current drain and abx. Monitor Hgb     LOS: 18 days    Brigid Re , [facility] 09/09/2017, 8:13 AM Pager:  [phone] Consults: [phone] Mon-Fri 7:00 am-4:30 pm Sat-Sun 7:00 am-11:30 am

## 2017-09-09 NOTE — Progress Notes (Addendum)
Assessment/Plan: 1 Acute cholecystitis - s/p lap chole 4/6. Bile duct leak sp IR biliary drain 4/11/ 19 for IR intervention for enlarging intraabdominal collections 2 Acute resp failure/ asp PNA - VDRF 3 Septic shock - 4 ESRD - Back on CRRT due to hypotension and TNA, keep even 5 Anemia of CKD-Hgb low. Last darbe 150 ug on 4/15.  S/P prbc's  6 Volume - keeping even on CRRT 7 LUE DVT - acute L IJ DVT,  Cath dc'd  Subjective: Interval History: recurrent shock  Objective: Vital signs in last 24 hours: Temp:  [94.3 F (34.6 C)-97.5 F (36.4 C)] 97.4 F (36.3 C) (04/22 1044) Pulse Rate:  [65-85] 83 (04/22 1044) Resp:  [17-50] 37 (04/22 1044) BP: (61-153)/(32-116) 83/69 (04/22 1044) SpO2:  [88 %-100 %] 94 % (04/22 1044) FiO2 (%):  [40 %-50 %] 40 % (04/22 0816) Weight:  [64.8 kg (142 lb 13.7 oz)] 64.8 kg (142 lb 13.7 oz) (04/22 0300) Weight change: 1 kg (2 lb 3.3 oz)  Intake/Output from previous day: 04/21 0701 - 04/22 0700 In: 2848.4 [I.V.:2154.4; Blood:454; NG/GT:60; IV Piggyback:150] Out: 0175 [Emesis/NG output:50] Intake/Output this shift: Total I/O In: 521.7 [I.V.:421.7; Other:100] Out: 453 [Other:453]  General appearance: unresponsive Extremities: tr edema BLE  Lab Results: Recent Labs    09/08/17 1936 09/09/17 0400  WBC 24.9* 16.4*  HGB 7.7* 9.0*  HCT 22.1* 25.7*  PLT 147* 164   BMET:  Recent Labs    09/08/17 2345 09/09/17 0400  NA 139 135  K 3.5 3.7  CL 99* 96*  CO2 24 22  GLUCOSE 135* 101*  BUN 22* 17  CREATININE 2.60* 2.21*  CALCIUM 8.0* 7.8*   No results for input(s): PTH in the last 72 hours. Iron Studies: No results for input(s): IRON, TIBC, TRANSFERRIN, FERRITIN in the last 72 hours. Studies/Results: Ct Abdomen Pelvis Wo Contrast  Result Date: 09/08/2017 CLINICAL DATA:  Elevated lactic acid. Bloody stools. Status post cholecystectomy August 24, 2017. History of breast cancer, end-stage renal disease on dialysis. EXAM: CT ABDOMEN AND PELVIS  WITHOUT CONTRAST TECHNIQUE: Multidetector CT imaging of the abdomen and pelvis was performed following the standard protocol without IV contrast. COMPARISON:  CT abdomen and pelvis September 03, 2017. FINDINGS: LOWER CHEST: Multifocal lung base consolidation improved from prior examination. Heart size is normal. Moderate coronary artery calcifications. No pericardial effusion. HEPATOBILIARY: Nodular hepatic contour. Marked interval increase in size large perihepatic heterogeneous fluid collection largest component measuring 16.7 x 6.8 cm (24 Hounsfield units). Fluid collection is contiguous with gallbladder fossa, status post cholecystectomy. PANCREAS: Normal. SPLEEN: Large perisplenic fluid collection. ADRENALS/URINARY TRACT: Kidneys are orthotopic, extremely atrophic. Bilateral renal cysts measuring to 16 mm on the RIGHT. No nephrolithiasis, hydronephrosis; limited assessment for renal masses on this nonenhanced examination. The unopacified ureters are normal in course and caliber. Urinary bladder is partially distended and unremarkable. Normal adrenal glands. STOMACH/BOWEL: Nasogastric tube tip terminates in mid stomach. The stomach, small and large bowel are normal in course and caliber without inflammatory changes, sensitivity decreased by lack of enteric contrast. Moderate colonic diverticulosis. VASCULAR/LYMPHATIC: Aortoiliac vessels are normal in course and caliber. Mild calcific atherosclerosis. LEFT femoral central venous catheter placed. REPRODUCTIVE: Status post hysterectomy. 10 mm LEFT adnexal cyst, no routine indicated follow-up. OTHER: RIGHT upper quadrant pigtail drainage CT catheter posteriorly abutting and possibly within the hepatic fluid collection. Multiple heterogeneous intraperitoneal fluid collections along the anterior abdominal wall largest measuring 13.4 x 5.6 cm which appears to communicate with the hepatic collection. Small volume  ascites within pelvis. MUSCULOSKELETAL: Non-acute.  Osteopenia. Multiple old mild compression fractures and old Schmorl's nodes. Anterior abdominal wall scarring. IMPRESSION: 1. Marked interval growth of large perihepatic and perisplenic fluid collections with multiple new intraperitoneal fluid collections compatible with abscess, less likely hematoma. RIGHT pigtail drainage catheter abutting perihepatic fluid collection. 2. Status post cholecystectomy. 3. Resolving multifocal pneumonia. 4. These results will be called to the ordering clinician or representative by the Radiologist Assistant, and communication documented in the PACS or zVision Dashboard. Electronically Signed   By: Elon Alas M.D.   On: 09/08/2017 18:57   Dg Chest Port 1 View  Result Date: 09/08/2017 CLINICAL DATA:  Acute respiratory failure. EXAM: PORTABLE CHEST 1 VIEW COMPARISON:  One day prior FINDINGS: Endotracheal tube terminates 2.5 cm above carina.Nasogastric tube extends beyond the inferior aspect of the film. Right upper quadrant surgical clips. Normal heart size for level of inspiration. Atherosclerosis in the transverse aorta. No pleural effusion or pneumothorax. Right mid lung and left base airspace disease is not significantly changed. No new pulmonary opacities. IMPRESSION: No change in multifocal bilateral airspace disease, most consistent with pneumonia. Aortic Atherosclerosis (ICD10-I70.0). Electronically Signed   By: Abigail Miyamoto M.D.   On: 09/08/2017 09:03    Scheduled: . chlorhexidine gluconate (MEDLINE KIT)  15 mL Mouth Rinse BID  . Chlorhexidine Gluconate Cloth  6 each Topical Daily  . darbepoetin (ARANESP) injection - DIALYSIS  150 mcg Intravenous Q Mon-HD  . doxercalciferol  2 mcg Intravenous Q M,W,F-HD  . insulin aspart  0-9 Units Subcutaneous Q4H  . mouth rinse  15 mL Mouth Rinse 10 times per day  . midodrine  10 mg Per Tube TID WC  . pantoprazole  40 mg Intravenous Q12H  . sodium chloride flush  10-40 mL Intracatheter Q12H   Continuous: . sodium  chloride Stopped (09/08/17 1700)  . sodium chloride    . dextrose 50 mL/hr at 09/09/17 1000  . dextrose Stopped (09/08/17 1700)  . norepinephrine (LEVOPHED) Adult infusion 21 mcg/min (09/09/17 1041)  . phytonadione (VITAMIN K) IV 10 mg (09/09/17 1009)  . piperacillin-tazobactam Stopped (09/09/17 4142)  . dialysis replacement fluid (prismasate) 400 mL/hr at 09/09/17 0745  . dialysis replacement fluid (prismasate) 200 mL/hr at 09/08/17 1853  . dialysate (PRISMASATE) 1,600 mL/hr at 09/09/17 1058  .  sodium bicarbonate  infusion 1000 mL 75 mL/hr at 09/09/17 1000  . sodium chloride    . TPN ADULT (ION)         LOS: 18 days   Estanislado Emms 09/09/2017,11:01 AM

## 2017-09-09 NOTE — Progress Notes (Signed)
Nutrition Follow-up  DOCUMENTATION CODES:   Non-severe (moderate) malnutrition in context of acute illness/injury  INTERVENTION:    After access established, TPN per pharmacy   Monitor magnesium, potassium, and phosphorus daily for at least 3 days, MD to replete as needed, as pt is at risk for refeeding syndrome given Moderate Malnutrition identification and minimal intake for > 2 weeks.  NUTRITION DIAGNOSIS:   Moderate Malnutrition related to acute illness(cholecystitis, bile leak) as evidenced by energy intake < 75% for > 7 days, mild muscle depletion.  Ongoing   GOAL:   Patient will meet greater than or equal to 90% of their needs  Unmet  MONITOR:   Other (Comment), Vent status, Labs, Skin, I & O's(TPN tolerance)  REASON FOR ASSESSMENT:   Consult New TPN/TNA  ASSESSMENT:   84 yo female with PMH of HTN, arthritis, ESRD-on HD, and breast cancer who was admitted on 4/3 with acute cholecystitis. S/P lap chole for severe calculous cholecystitis on 4/6. Found to have bile leak. ERCP was aborted 4/11 due to aspiration in endoscopy. S/P biloma drain placement by IR 4/11.  TF stopped 4/20, pt with emesis. Pt developed shock and bleeding from abdominal drain 4/20. TPN to be initiated at 20 ml/hr with a goal rate ~50 ml/hr once access is established. Pt placed back on CRRT. Pt for US abdominal drain placement today. Recalculated pt's estimated nutrition needs based on these changes.   Spoke with MD, unable to do postpyloric tube d/t abdominal bleeding/drains.  Discussed pt with RN and pharamcist. Pt to hemodynamically unstable for another line placement. Pt without adequate nutrition since admission.   Patient continues to be intubated on ventilator support MV: 13.6 L/min Temp (24hrs), Avg:96.8 F (36 C), Min:94.3 F (34.6 C), Max:97.5 F (36.4 C)  Pt is net positive 7.4 L since admission. Weight trends remain fairly stable since admission.   Labs reviewed; CBG 62-317,  Chloride 96, Albumin 1.7, Prealbumin <5, Hemoglobin 9.0 Medications reviewed; sliding scale insulin, Hectorol, Protonix, norepinephrine, vitamin K, sodium bicarbonate, D10 at 50 mL/hr, D5 at 10 mL/hr  Diet Order:  Diet NPO time specified TPN ADULT (ION)  EDUCATION NEEDS:   No education needs have been identified at this time  Skin:  Skin Assessment: Skin Integrity Issues: Skin Integrity Issues:: Incisions Incisions: abdomen, breast  Last BM:  09/09/17  Height:   Ht Readings from Last 1 Encounters:  09/02/17 5\' 2"  (1.575 m)   Weight:   Wt Readings from Last 1 Encounters:  09/09/17 142 lb 13.7 oz (64.8 kg)   Ideal Body Weight:  50 kg  BMI:  Body mass index is 26.13 kg/m.  Estimated Nutritional Needs:   Kcal:  1350  Protein:  110-129 grams  Fluid:  1.3-1.5 L  Marjo Bicker, MS, RDN, LDN 09/09/2017 2:23 PM

## 2017-09-09 NOTE — Progress Notes (Addendum)
PULMONARY / CRITICAL CARE MEDICINE   Name: Pam Fuller MRN: [id number] DOB: [birth year]    ADMISSION DATE:  09/13/2017 CONSULTATION DATE:  4/11  REFERRING MD:  Dr. Maudie Mercury  CHIEF COMPLAINT:  Aspiration  BRIEF SUMMARY:   [name] is a 84 year old female with ESRD on MWF HD, HTN, Anemia, and hx of breast cancer who presented to the ED on 4/4 with RLQ abdominal pain. She was found to have acute cholecystitis with a gallstone lodged in the gallbladder neck. CBD measured up to 10 mm. She underwent laparoscopic cholecystectomy on 4/6. She initially did well but on 4/8 was noted to have worsened pain and confusion and thought to have post-op ileus. CT abd/pelvis 4/8 showed small-mod fluid in the abdominal pelvis, read as normal post op changes but could not exclude a bile leak. A hepatobiliary scan was obtained on 4/9 which did suggest a bile leak. Surgery recommended IR drain and GI consult for ERCP. She went to the Endoscopy unit on 4/11 AM for ERCP however during prep with anesthesia she had significant bilious emesis with hypotension requiring intubation and pressor support. ERCP was not attempted. She was subsequently transferred to the medical ICU.   STUDIES:  CT abd/pelv 4/4 >> acute cholecystitis, gallstone in GB neck, CBD 10 mm  CT Head: no acute changes, chronic small vessel ischemic changes  CT abd/pelvis 4/8 >> small-mod fluid in abd/pelvis  Hepatobiliary scan 4/9 >> bile leak  TTE 4/12 >> EF 65-70%, G1DD, narrow LVOT, mild TR, mild pHTN  U/S Abdomen 4/14 >> small fluid near cholecystectomy site  Hepatobiliary scan 4/14 >> Rt subhepatic fluid collection consistent w/ biloma; increased in size from prior  CT abd/pelvis w/ oral contrast 4/16 >> drainage catheter at inferior liver margin with decreased perihepatic fluid; low-density fluid at left subdiaphragmatic area, questionable colonic wall thickening throughout, multifocal airspace disease of lung bases  CT  abdomen.pelvis 4/21: interval growth of large perihepatic and perisplenic fluid collection with multiple, new intraperitoneal fluid collections. Resolving multifocal pneumonia.    CULTURES: Tracheal aspirate 4/11 >> no growth Abd fluid 4/11 >> no growth 5 days Abd fluid 4/15 >> no growth 3 days  ANTIBIOTICS: Zosyn 4/3 >> 4/7 Unasyn 4/11 Zosyn 4/12 - 4/19 Zosyn 4/21-  LINES/TUBES: ETT 4/11 OGT 4/11 PIV RUE 4/9 RLQ Biliary Drain 4/11 HD Cath LIJ 4/12 >> 4/19 L femoral trialysis catheter 4/21>  SIGNIFICANT EVENTS: 4/4 - admitted w/ acute cholecystitis 4/6 - lap chole 4/9 - bile leak seen on hepatobiliary scan 4/11 - Aspirated during prep for ERCP, intubated and transferred to ICU 4/11 - Rt abd fluid drain placed by IR 4/12- Heparin gtt started for NSTEMI; held after OG bleeding and trop peak 4/13 - worsening liver enzyymes. ESRD - Anuric. CRRT started. On levophed 21mcg and fent 170mcg 4/14 - Hgb 6.0, transfused 2 units PRBCs 4/17 - DVT LUE - started bivalirudin for suspected HIT 4/18 - CVVH d/c'ed, return to intermittent HD. HIT 4/19 - Restarted HD  4/20 - Bleeding from GB drain, OGT, and stools. Received 1u pRBC. Heparin gtt held. 4/21 - Multiple, new fluid collections found on CT abd/pelvis    SUBJECTIVE/OVERNIGHT/INTERVAL HX -  Hypothermic overnight on bear hugger. Unarousable to voice this AM.    VITAL SIGNS: BP (!) 103/46   Pulse 75   Temp (!) 96.3 F (35.7 C) (Oral)   Resp (!) 35   Ht 5\' 2"  (1.575 m)   Wt 142 lb 13.7 oz (64.8 kg)  SpO2 100%   BMI 26.13 kg/m   HEMODYNAMICS:    VENTILATOR SETTINGS: Vent Mode: PRVC FiO2 (%):  [40 %-50 %] 40 % Set Rate:  [27 bmp] 27 bmp Vt Set:  [300 mL] 300 mL PEEP:  [5 cmH20] 5 cmH20  INTAKE / OUTPUT: I/O last 3 completed shifts: In: 1853.8 [I.V.:1049.8; Blood:744; Other:20; NG/GT:40] Out: 183 [Emesis/NG output:175; Other:8]  PHYSICAL EXAMINATION:  General: chronically ill-appearing female, mechanically intubated  and sedated  HEENT: NCAT, ET and OG tube in place  Cardiac: RRR, 2/6 systolic murmur  Pulm: decreased breath sounds at the bases, no wheezing noted  Abd: soft, NTND, decreased bowel sounds  Ext: LUE edema L>>R unchanged from yesterday, no lower extremity edema, L AVF with palpable thrill, femoral trialysis cathter in place with dressing c/d/i Neuro: R pupil dilated compared to L pupil, does not arouse to voice, does move all 4 extremities spontaneously but no purposefully, does not follow commands    PULMONARY Recent Labs  Lab 09/03/17 0315 09/08/17 0945  PHART 7.354 7.253*  PCO2ART 48.8* 25.9*  PO2ART 90.3 166*  HCO3 26.8 11.1*  O2SAT 97.3 98.6    CBC Recent Labs  Lab 09/08/17 0655 09/08/17 1936 09/09/17 0400  HGB 7.6* 7.7* 9.0*  HCT 23.1* 22.1* 25.7*  WBC 19.5* 24.9* PENDING  PLT 189 147* PENDING    COAGULATION Recent Labs  Lab 09/04/17 0923  INR 2.98    CARDIAC   No results for input(s): TROPONINI in the last 168 hours. No results for input(s): PROBNP in the last 168 hours.   CHEMISTRY Recent Labs  Lab 09/06/17 0515 09/06/17 1619 09/07/17 0205 09/07/17 0720  09/08/17 2952 09/08/17 1552 09/08/17 1920 09/08/17 2345 09/09/17 0400  NA 135  --  132*  --    < > 137 136 137 139 135  K 3.5  --  2.9*  --    < > 4.5 3.5 3.3* 3.5 3.7  CL 102  --  94*  --    < > 96* 93* 95* 99* 96*  CO2 24  --  26  --    < > 12* 19* 22 24 22   GLUCOSE 94  --  129*  --    < > 35* 292* 284* 135* 101*  BUN 17  --  12  --    < > 29* 34* 34* 22* 17  CREATININE 2.33*  --  2.11*  --    < > 4.03* 4.07* 4.04* 2.60* 2.21*  CALCIUM 8.6*  --  8.0*  --    < > 9.2 8.7* 8.5* 8.0* 7.8*  MG 2.3 1.9 1.8 2.5*  --  2.8*  --   --   --  2.4  PHOS 2.2* 4.3 3.7 4.1  --   --   --   --   --  3.5   < > = values in this interval not displayed.   Estimated Creatinine Clearance: 17.4 mL/min (A) (by C-G formula based on SCr of 2.21 mg/dL (H)).  I/O last 3 completed shifts: In: 1853.8 [I.V.:1049.8;  Blood:744; Other:20; NG/GT:40] Out: 183 [Emesis/NG output:175; Other:8]   Intake/Output Summary (Last 24 hours) at 09/09/2017 0652 Last data filed at 09/09/2017 0600 Gross per 24 hour  Intake 2687.12 ml  Output 1272 ml  Net 1415.12 ml     LIVER Recent Labs  Lab 09/04/17 0445 09/04/17 0923  09/05/17 0243 09/06/17 0515 09/07/17 0205 09/08/17 1021 09/09/17 0400  AST 157*  --   --  113* 68*  --  851* 2,141*  ALT 234*  --   --  166* 112*  --  257* 613*  ALKPHOS 510*  --   --  478* 458*  --  350* 484*  BILITOT 4.6*  --   --  4.0* 3.5*  --  4.0* 5.3*  PROT 5.9*  --   --  5.7* 5.5*  --  4.4* 5.1*  ALBUMIN 1.7*  --    < > 1.6* 1.5* 1.5* 1.3* 1.7*  INR  --  2.98  --   --   --   --   --   --    < > = values in this interval not displayed.     INFECTIOUS Recent Labs  Lab 09/08/17 1552  LATICACIDVEN 10.8*     ENDOCRINE CBG (last 3)  Recent Labs    09/08/17 1923 09/08/17 2351 09/09/17 0408  GLUCAP 256* 133* 98     IMAGING x48h  - image(s) personally visualized  -   highlighted in bold Ct Abdomen Pelvis Wo Contrast  Result Date: 09/08/2017 CLINICAL DATA:  Elevated lactic acid. Bloody stools. Status post cholecystectomy August 24, 2017. History of breast cancer, end-stage renal disease on dialysis. EXAM: CT ABDOMEN AND PELVIS WITHOUT CONTRAST TECHNIQUE: Multidetector CT imaging of the abdomen and pelvis was performed following the standard protocol without IV contrast. COMPARISON:  CT abdomen and pelvis September 03, 2017. FINDINGS: LOWER CHEST: Multifocal lung base consolidation improved from prior examination. Heart size is normal. Moderate coronary artery calcifications. No pericardial effusion. HEPATOBILIARY: Nodular hepatic contour. Marked interval increase in size large perihepatic heterogeneous fluid collection largest component measuring 16.7 x 6.8 cm (24 Hounsfield units). Fluid collection is contiguous with gallbladder fossa, status post cholecystectomy. PANCREAS: Normal.  SPLEEN: Large perisplenic fluid collection. ADRENALS/URINARY TRACT: Kidneys are orthotopic, extremely atrophic. Bilateral renal cysts measuring to 16 mm on the RIGHT. No nephrolithiasis, hydronephrosis; limited assessment for renal masses on this nonenhanced examination. The unopacified ureters are normal in course and caliber. Urinary bladder is partially distended and unremarkable. Normal adrenal glands. STOMACH/BOWEL: Nasogastric tube tip terminates in mid stomach. The stomach, small and large bowel are normal in course and caliber without inflammatory changes, sensitivity decreased by lack of enteric contrast. Moderate colonic diverticulosis. VASCULAR/LYMPHATIC: Aortoiliac vessels are normal in course and caliber. Mild calcific atherosclerosis. LEFT femoral central venous catheter placed. REPRODUCTIVE: Status post hysterectomy. 10 mm LEFT adnexal cyst, no routine indicated follow-up. OTHER: RIGHT upper quadrant pigtail drainage CT catheter posteriorly abutting and possibly within the hepatic fluid collection. Multiple heterogeneous intraperitoneal fluid collections along the anterior abdominal wall largest measuring 13.4 x 5.6 cm which appears to communicate with the hepatic collection. Small volume ascites within pelvis. MUSCULOSKELETAL: Non-acute. Osteopenia. Multiple old mild compression fractures and old Schmorl's nodes. Anterior abdominal wall scarring. IMPRESSION: 1. Marked interval growth of large perihepatic and perisplenic fluid collections with multiple new intraperitoneal fluid collections compatible with abscess, less likely hematoma. RIGHT pigtail drainage catheter abutting perihepatic fluid collection. 2. Status post cholecystectomy. 3. Resolving multifocal pneumonia. 4. These results will be called to the ordering clinician or representative by the Radiologist Assistant, and communication documented in the PACS or zVision Dashboard. Electronically Signed   By: Elon Alas M.D.   On:  09/08/2017 18:57   Dg Chest Port 1 View  Result Date: 09/08/2017 CLINICAL DATA:  Acute respiratory failure. EXAM: PORTABLE CHEST 1 VIEW COMPARISON:  One day prior FINDINGS: Endotracheal tube terminates 2.5 cm above carina.Nasogastric tube extends beyond  the inferior aspect of the film. Right upper quadrant surgical clips. Normal heart size for level of inspiration. Atherosclerosis in the transverse aorta. No pleural effusion or pneumothorax. Right mid lung and left base airspace disease is not significantly changed. No new pulmonary opacities. IMPRESSION: No change in multifocal bilateral airspace disease, most consistent with pneumonia. Aortic Atherosclerosis (ICD10-I70.0). Electronically Signed   By: Abigail Miyamoto M.D.   On: 09/08/2017 09:03     DISCUSSION: [name] is a 84 year old female with ESRD on MWF HD, HTN, Anemia, and hx of breast cancer who was admitted 4/4 with acute cholecystitis. S/p lap chole 4/6 complicated by post-op bile leak. Went for ERCP 4/11 however developed bilious emesis during prep, hypotension, and required intubation plus pressor support. Transferred to the ICU on 4/11.  ASSESSMENT / PLAN: PULMONARY A: Acute Respiratory Distress 2/2 Aspiration Requiring Mechanical Ventilation since 4/11 P:   - Failed SBT 4/21. Continue vent support for now  - CT abd/pelvis 4/21  with improvement in pneumonia, but new intraperitoneal fluid collections  - VAP protocol   CARDIOVASCULAR A:  #baseline Aortic Atherosclerosis on CXR RBBB - present prior to admission #current Septic Shock 2/2 intra-peritoneal abscesses  Troponin elevation (peak 4.47) 2/2 demand ischemia in setting of hypotensive event - resolved  Echocardiogram w/ EF 65-70% Prolonged QTc LUE DVT - heparin started 4/19 and stopped 4/21 P:  - Levophed restarted 4/21  - MAP goal > 65 - Continue to hold heparin in the setting of acute blood loss 4/21  - Continue aspirin as ordered - Continue Midodrin,  increase to 10 mg 4/20   RENAL A:   ESRD on MWF HD AVF LUE P:   - CRRT restarted on 4/21 - Follow up output and monitor electrolytes  - Replace electrolytes as indicated  GASTROINTESTINAL A:   Acute Cholecystitis s/p lap chole 4/6 Bile leak s/p IR Rt abdominal fluid drain 4/11 - biloma by HIDA 4/14 Shock Liver Multiple new intraperitoneal abscesses  Nutrition SUP P:   - IR to see this AM. CT guided percutaneous drainage ordered  - Zosyn 4/121>  - Right abdominal fluid drain in place, continue to drain - Appreciate surgery and gastroenterology assistance - Plan for ERCP when the patient is more stable, likely next week - Continue to monitor transaminases  - Tube feeds stopped 4/20 2/2 vominting. Started TPN 4/21 - Continue Protonix as ordered   HEMATOLOGIC A:   Hx of Breast Cancer - no acute issue Anemia of Chronic Disease -  S/p 2 units PRBCs 4/14 Leukocytosis Thrombocytopenia - resolved  LUE (IJV) DVT Acute blood loss anemia s/p 2 units PRBCs 4/21 P:  - Continue to hold heparin   - HGB goal > 7.0 - CBC daily  - Bivalirudin stopped 4/20, heparin initiated after HIT antibody was negative - Left IJ temporary HD catheter removed 4/19 - L femoral trialysis catheter placed on 4/21  INFECTIOUS A:   Septic Shock from Aspiration PNA - resolved  Bile Leak s/p IR Biliary Drain 4/11 Septic shock 2/2 intraperitoneal abscesses, interval growth of known one and multiple new ones  P:   - Zosyn 4/12 >> 4/19 - Restarted Zosyn 4/21 - IR to see this AM   ENDOCRINE A:   Secondary Hyperparathyroidism Hypoglycemia - likely 2/2 shock liver   P:   - Tube feeds stopped 4/20 and TPN started 4/21 - Monitoring transaminases  - CBG monitoring q4h   NEUROLOGIC A:   Pain/Agitation Anisocoria  P:   -  RASS goal: 0 to -1 - Fentanyl and Versed as needed - Consider Head CT to evaluate for intracranial bleeding in the setting of acute blood loss requiring transfusion overnight     FAMILY  - Updates: Sons updated at bedside 4/21.    Welford Roche, MD  Internal Medicine PGY-1  P [phone]

## 2017-09-09 NOTE — Progress Notes (Signed)
Inpatient Diabetes Program Recommendations  AACE/ADA: New Consensus Statement on Inpatient Glycemic Control (2015)  Target Ranges:  Prepandial:   less than 140 mg/dL      Peak postprandial:   less than 180 mg/dL (1-2 hours)      Critically ill patients:  140 - 180 mg/dL   Lab Results  Component Value Date   GLUCAP 127 (H) 09/09/2017   HGBA1C  04/01/2010    5.4 (NOTE)                                                                       According to the ADA Clinical Practice Recommendations for 2011, when HbA1c is used as a screening test:   >=6.5%   Diagnostic of Diabetes Mellitus           (if abnormal result  is confirmed)  5.7-6.4%   Increased risk of developing Diabetes Mellitus  References:Diagnosis and Classification of Diabetes Mellitus,Diabetes OINO,6767,20(NOBSJ 1):S62-S69 and Standards of Medical Care in         Diabetes - 2011,Diabetes Care,2011,34  (Suppl 1):S11-S61.    Review of Glycemic ControlResults for BRISTOL, OSENTOSKI (MRN [id number]) as of 09/09/2017 10:02  Ref. Range 09/08/2017 19:23 09/08/2017 23:51 09/09/2017 04:08 09/09/2017 08:07 09/09/2017 08:29  Glucose-Capillary Latest Ref Range: 65 - 99 mg/dL 256 (H) 133 (H) 98 61 (L) 127 (H)   Current orders for Inpatient glycemic control:  Novolog sensitive q 4 hours  Inpatient Diabetes Program Recommendations:    Please consider reducing Novolog correction to "very sensitive" starting at 151 mg/dL. 151-200 mg/dL-1 unit 201-250 mg/dL-2 units 251-300 mg/dL-3 units 301-350 mg/dL- 4 units 351-400 mg/dL- 5 units  Thanks,  Adah Perl, RN, BC-ADM Inpatient Diabetes Coordinator Pager [phone] (8a-5p)

## 2017-09-09 NOTE — Progress Notes (Signed)
Progress Note   Subjective  Patient remains intubated and sedated on vasopressors for blood pressure support Received 2 units of packed red cell transfusion yesterday, heparin held Repeat CT abdomen performed which showed growth of perihepatic and perisplenic fluid collections; IR reconsulted to consider additional percutaneous drain Blood in stool and OG yesterday, some blood in biliary drain    Objective   Vital signs in last 24 hours: Temp:  [94.3 F (34.6 C)-97.5 F (36.4 C)] (P) 97.4 F (36.3 C) (04/22 1845) Pulse Rate:  [65-131] 82 (04/22 1449) Resp:  [21-49] 35 (04/22 1840) BP: (68-142)/(32-82) 119/58 (04/22 1840) SpO2:  [87 %-100 %] 100 % (04/22 1840) FiO2 (%):  [40 %-100 %] 40 % (04/22 1830) Weight:  [142 lb 13.7 oz (64.8 kg)] 142 lb 13.7 oz (64.8 kg) (04/22 0300) Last BM Date: 09/09/17 Gen: Critically ill-appearing female intubated and sedated HEENT: ET tube in place, OG in place CV: Mild tachycardia, systolic ejection murmur Pulm: Coarse bilaterally Abd: Drainage tube in place, bowel sounds hypoactive Ext: Minimal lower extremity edema Neuro: Intubated and sedated   Intake/Output from previous day: 04/21 0701 - 04/22 0700 In: 2848.4 [I.V.:2154.4; Blood:454; NG/GT:60; IV Piggyback:150] Out: 2505 [Emesis/NG output:50] Intake/Output this shift: Total I/O In: 2908.8 [I.V.:1291.8; Blood:1322; Other:130; NG/GT:65; IV Piggyback:100] Out: 2339 [Drains:20; LZJQB:3419]  Lab Results: Recent Labs    09/08/17 0655 09/08/17 1936 09/09/17 0400  WBC 19.5* 24.9* 16.4*  HGB 7.6* 7.7* 9.0*  HCT 23.1* 22.1* 25.7*  PLT 189 147* 164   BMET Recent Labs    09/08/17 1920 09/08/17 2345 09/09/17 0400  NA 137 139 135  K 3.3* 3.5 3.7  CL 95* 99* 96*  CO2 22 24 22   GLUCOSE 284* 135* 101*  BUN 34* 22* 17  CREATININE 4.04* 2.60* 2.21*  CALCIUM 8.5* 8.0* 7.8*   LFT Recent Labs    09/08/17 1021 09/09/17 0400  PROT 4.4* 5.1*  ALBUMIN 1.3* 1.7*  AST 851* 2,141*    ALT 257* 613*  ALKPHOS 350* 484*  BILITOT 4.0* 5.3*  BILIDIR 2.3*  --   IBILI 1.7*  --    PT/INR Recent Labs    09/09/17 0636 09/09/17 1619  LABPROT 68.5* 23.7*  INR 8.30* 2.14    Studies/Results: Ct Abdomen Pelvis Wo Contrast  Result Date: 09/08/2017 CLINICAL DATA:  Elevated lactic acid. Bloody stools. Status post cholecystectomy August 24, 2017. History of breast cancer, end-stage renal disease on dialysis. EXAM: CT ABDOMEN AND PELVIS WITHOUT CONTRAST TECHNIQUE: Multidetector CT imaging of the abdomen and pelvis was performed following the standard protocol without IV contrast. COMPARISON:  CT abdomen and pelvis September 03, 2017. FINDINGS: LOWER CHEST: Multifocal lung base consolidation improved from prior examination. Heart size is normal. Moderate coronary artery calcifications. No pericardial effusion. HEPATOBILIARY: Nodular hepatic contour. Marked interval increase in size large perihepatic heterogeneous fluid collection largest component measuring 16.7 x 6.8 cm (24 Hounsfield units). Fluid collection is contiguous with gallbladder fossa, status post cholecystectomy. PANCREAS: Normal. SPLEEN: Large perisplenic fluid collection. ADRENALS/URINARY TRACT: Kidneys are orthotopic, extremely atrophic. Bilateral renal cysts measuring to 16 mm on the RIGHT. No nephrolithiasis, hydronephrosis; limited assessment for renal masses on this nonenhanced examination. The unopacified ureters are normal in course and caliber. Urinary bladder is partially distended and unremarkable. Normal adrenal glands. STOMACH/BOWEL: Nasogastric tube tip terminates in mid stomach. The stomach, small and large bowel are normal in course and caliber without inflammatory changes, sensitivity decreased by lack of enteric contrast. Moderate colonic diverticulosis. VASCULAR/LYMPHATIC: Aortoiliac  vessels are normal in course and caliber. Mild calcific atherosclerosis. LEFT femoral central venous catheter placed. REPRODUCTIVE: Status  post hysterectomy. 10 mm LEFT adnexal cyst, no routine indicated follow-up. OTHER: RIGHT upper quadrant pigtail drainage CT catheter posteriorly abutting and possibly within the hepatic fluid collection. Multiple heterogeneous intraperitoneal fluid collections along the anterior abdominal wall largest measuring 13.4 x 5.6 cm which appears to communicate with the hepatic collection. Small volume ascites within pelvis. MUSCULOSKELETAL: Non-acute. Osteopenia. Multiple old mild compression fractures and old Schmorl's nodes. Anterior abdominal wall scarring. IMPRESSION: 1. Marked interval growth of large perihepatic and perisplenic fluid collections with multiple new intraperitoneal fluid collections compatible with abscess, less likely hematoma. RIGHT pigtail drainage catheter abutting perihepatic fluid collection. 2. Status post cholecystectomy. 3. Resolving multifocal pneumonia. 4. These results will be called to the ordering clinician or representative by the Radiologist Assistant, and communication documented in the PACS or zVision Dashboard. Electronically Signed   By: Elon Alas M.D.   On: 09/08/2017 18:57   Dg Chest Port 1 View  Result Date: 09/08/2017 CLINICAL DATA:  Acute respiratory failure. EXAM: PORTABLE CHEST 1 VIEW COMPARISON:  One day prior FINDINGS: Endotracheal tube terminates 2.5 cm above carina.Nasogastric tube extends beyond the inferior aspect of the film. Right upper quadrant surgical clips. Normal heart size for level of inspiration. Atherosclerosis in the transverse aorta. No pleural effusion or pneumothorax. Right mid lung and left base airspace disease is not significantly changed. No new pulmonary opacities. IMPRESSION: No change in multifocal bilateral airspace disease, most consistent with pneumonia. Aortic Atherosclerosis (ICD10-I70.0). Electronically Signed   By: Abigail Miyamoto M.D.   On: 09/08/2017 09:03       Assessment / Plan:   84 year old female with end-stage renal  disease status post laparoscopic cholecystectomy on 08/30/2017 with subsequent bile leak, complicated by respiratory failure prior to ERCP attempt, course complicated by non-ST elevation MI and shock liver, bile peritonitis and multiple abdominal fluid collections secondary to bile leak.  Left upper extremity DVT briefly on heparin, now possible GI bleeding, not overt currently.  2 units of packed red cells on 09/08/2017. INR very elevated which has corrected significantly with vitamin K and FFP.  She has been on PPI drip.  Very difficult situation with ongoing bile leak status post laparoscopic cholecystectomy 16 days ago.  Ongoing increasing intra-abdominal fluid collections/bilomas leading to bile peritonitis.    I discussed the case with Dr. Elsworth Soho.  ERCP is the only option available for control of her ongoing bile leak however represents very very high risk for her.  She is critically ill and has a very poor prognosis without ERCP and likely with ERCP.  As of today, she is not appropriate for ERCP with coagulopathy.  I will discussed the case with my biliary colleague, Dr. Fuller Plan, with no plan for immediate ERCP.  I will plan to discuss overall plan of care with the patient's 2 sons tomorrow.     Principal Problem:   Acute cholecystitis Active Problems:   ESRD on dialysis [facility])   Essential hypertension   GERD (gastroesophageal reflux disease)   Bile leak, postoperative   Acute respiratory failure with hypoxia (HCC)   Aspiration into airway   ESRD on hemodialysis (HCC)   Malnutrition of moderate degree   Septic shock (HCC)   NSTEMI (non-ST elevated myocardial infarction) ([place])   Shock liver   Anastomotic leak of biliary tree   Coagulopathy ([place])     LOS: 18 days   Zyaira Vejar M Delrose Rohwer  09/09/2017, 6:54 PM

## 2017-09-09 NOTE — Consult Note (Addendum)
Chief Complaint: Patient was seen in consultation today for biloma drain (additional) Chief Complaint  Patient presents with  . Abdominal Pain   at the request of Dr Vivien Rossetti; Dr Sherrill Raring   Supervising Physician: Daryll Brod  Patient Status: [facility] - In-pt  History of Present Illness: Pam Fuller is a 84 y.o. female   Hx Breast Ca Post op cholecystectomy 08/22/2017 IR Biloma drain placed 4/11 Intubated then NSTEMI Loletha Grayer and hypotensive yesterday Elevation in lactic acid; bloody stools Leukocytosis ESRD-- CRRT CT yesterday: IMPRESSION: 1. Marked interval growth of large perihepatic and perisplenic fluid collections with multiple new intraperitoneal fluid collections compatible with abscess, less likely hematoma. RIGHT pigtail drainage catheter abutting perihepatic fluid collection. 2. Status post cholecystectomy. 3. Resolving multifocal pneumonia.  Request made for additional drain placement Imaging reviewed with Dr Lyndel Pleasure procedure INR 8.3 today  Hold new drain for now; consider as INR trends down IR will keep on Radar - for appropriate timing   Past Medical History:  Diagnosis Date  . Arthritis   . Blood transfusion without reported diagnosis   . Cancer [facility])    patient states she had br ca 13 years ago  . ESRD (end stage renal disease) on dialysis ([place])   . Hypertension   . Renal disorder    patient on dialysis  . Wears glasses     Past Surgical History:  Procedure Laterality Date  . ABDOMINAL HYSTERECTOMY    . APPENDECTOMY    . BREAST LUMPECTOMY WITH AXILLARY LYMPH NODE BIOPSY  2001   left  . BREAST SURGERY    . CHOLECYSTECTOMY N/A 08/20/2017   Procedure: LAPAROSCOPIC CHOLECYSTECTOMY;  Surgeon: [name], MD;  Location: [place];  Service: General;  Laterality: N/A;  . COLONOSCOPY    . IR US GUIDE BX ASP/DRAIN  09/14/2017  . PARTIAL MASTECTOMY WITH NEEDLE LOCALIZATION Left 06/18/2013   Procedure: PARTIAL MASTECTOMY WITH NEEDLE  LOCALIZATION;  Surgeon: Adin Hector, MD;  Location: [place];  Service: General;  Laterality: Left;  . TONSILLECTOMY      Allergies: Erythromycin and Vicodin [hydrocodone-acetaminophen]  Medications: Prior to Admission medications   Medication Sig Start Date End Date Taking? Authorizing Provider  amLODipine (NORVASC) 10 MG tablet Take 10 mg by mouth every evening.  03/31/13  Yes [provider]  calcium acetate (PHOSLO) 667 MG capsule Take 2 capsules (1,334 mg total) by mouth 3 (three) times daily with meals. 09/15/15  Yes Dana Allan I, MD  cloNIDine (CATAPRES) 0.1 MG tablet Take 0.1 mg by mouth every evening.  03/24/13  Yes [provider]  fluticasone (FLONASE) 50 MCG/ACT nasal spray Place 2 sprays into both nostrils daily. 09/15/15  Yes Dana Allan I, MD  hydrALAZINE (APRESOLINE) 50 MG tablet Take 50 mg by mouth every evening.  04/14/13  Yes [provider]  multivitamin (RENA-VIT) TABS tablet Take 1 tablet by mouth at bedtime. 09/15/15  Yes Bonnell Public, MD  tamoxifen (NOLVADEX) 20 MG tablet Take 1 tablet (20 mg total) by mouth daily. Patient taking differently: Take 20 mg by mouth every evening.  02/12/17  Yes Causey, Charlestine Massed, NP  hydrOXYzine (ATARAX/VISTARIL) 25 MG tablet Take 1 tablet (25 mg total) by mouth every 8 (eight) hours as needed for itching. Patient not taking: Reported on 08/22/2017 09/15/15   Bonnell Public, MD     Family History  Problem Relation Age of Onset  . Cancer Maternal Aunt  breast    Social History   Socioeconomic History  . Marital status: Widowed    Spouse name: Not on file  . Number of children: Not on file  . Years of education: Not on file  . Highest education level: Not on file  Occupational History  . Not on file  Social Needs  . Financial resource strain: Not on file  . Food insecurity:    Worry: Not on file    Inability: Not on file  . Transportation  needs:    Medical: Not on file    Non-medical: Not on file  Tobacco Use  . Smoking status: Never Smoker  . Smokeless tobacco: Never Used  Substance and Sexual Activity  . Alcohol use: No  . Drug use: No  . Sexual activity: Not Currently  Lifestyle  . Physical activity:    Days per week: Patient refused    Minutes per session: Patient refused  . Stress: Rather much  Relationships  . Social connections:    Talks on phone: Not on file    Gets together: Not on file    Attends religious service: Not on file    Active member of club or organization: Not on file    Attends meetings of clubs or organizations: Not on file    Relationship status: Not on file  Other Topics Concern  . Not on file  Social History Narrative  . Not on file    Review of Systems: A 12 point ROS discussed and pertinent positives are indicated in the HPI above.  All other systems are negative.  Review of Systems  Constitutional:       Vent; sedated CRRT    Vital Signs: BP 136/63   Pulse 78   Temp (!) 97.3 F (36.3 C) (Axillary)   Resp (!) 43   Ht 5\' 2"  (1.575 m)   Wt 142 lb 13.7 oz (64.8 kg)   SpO2 100%   BMI 26.13 kg/m   Physical Exam  Abdominal: Soft. Bowel sounds are decreased.  Skin: Skin is warm and dry.  Site of existing biloma drain is clean and dry OP bloody   Nursing note and vitals reviewed.   Imaging: Ct Abdomen Pelvis Wo Contrast  Result Date: 09/08/2017 CLINICAL DATA:  Elevated lactic acid. Bloody stools. Status post cholecystectomy August 24, 2017. History of breast cancer, end-stage renal disease on dialysis. EXAM: CT ABDOMEN AND PELVIS WITHOUT CONTRAST TECHNIQUE: Multidetector CT imaging of the abdomen and pelvis was performed following the standard protocol without IV contrast. COMPARISON:  CT abdomen and pelvis September 03, 2017. FINDINGS: LOWER CHEST: Multifocal lung base consolidation improved from prior examination. Heart size is normal. Moderate coronary artery  calcifications. No pericardial effusion. HEPATOBILIARY: Nodular hepatic contour. Marked interval increase in size large perihepatic heterogeneous fluid collection largest component measuring 16.7 x 6.8 cm (24 Hounsfield units). Fluid collection is contiguous with gallbladder fossa, status post cholecystectomy. PANCREAS: Normal. SPLEEN: Large perisplenic fluid collection. ADRENALS/URINARY TRACT: Kidneys are orthotopic, extremely atrophic. Bilateral renal cysts measuring to 16 mm on the RIGHT. No nephrolithiasis, hydronephrosis; limited assessment for renal masses on this nonenhanced examination. The unopacified ureters are normal in course and caliber. Urinary bladder is partially distended and unremarkable. Normal adrenal glands. STOMACH/BOWEL: Nasogastric tube tip terminates in mid stomach. The stomach, small and large bowel are normal in course and caliber without inflammatory changes, sensitivity decreased by lack of enteric contrast. Moderate colonic diverticulosis. VASCULAR/LYMPHATIC: Aortoiliac vessels are normal in course and caliber. Mild  calcific atherosclerosis. LEFT femoral central venous catheter placed. REPRODUCTIVE: Status post hysterectomy. 10 mm LEFT adnexal cyst, no routine indicated follow-up. OTHER: RIGHT upper quadrant pigtail drainage CT catheter posteriorly abutting and possibly within the hepatic fluid collection. Multiple heterogeneous intraperitoneal fluid collections along the anterior abdominal wall largest measuring 13.4 x 5.6 cm which appears to communicate with the hepatic collection. Small volume ascites within pelvis. MUSCULOSKELETAL: Non-acute. Osteopenia. Multiple old mild compression fractures and old Schmorl's nodes. Anterior abdominal wall scarring. IMPRESSION: 1. Marked interval growth of large perihepatic and perisplenic fluid collections with multiple new intraperitoneal fluid collections compatible with abscess, less likely hematoma. RIGHT pigtail drainage catheter abutting  perihepatic fluid collection. 2. Status post cholecystectomy. 3. Resolving multifocal pneumonia. 4. These results will be called to the ordering clinician or representative by the Radiologist Assistant, and communication documented in the PACS or zVision Dashboard. Electronically Signed   By: Elon Alas M.D.   On: 09/08/2017 18:57   Ct Abdomen Pelvis Wo Contrast  Result Date: 09/03/2017 CLINICAL DATA:  84 year old female with a history of biloma. EXAM: CT ABDOMEN AND PELVIS WITHOUT CONTRAST TECHNIQUE: Multidetector CT imaging of the abdomen and pelvis was performed following the standard protocol without IV contrast. COMPARISON:  Chest x-ray 09/03/2017, nuclear medicine study 09/01/2017, prior CT 08/26/2017, 09/09/2017 FINDINGS: Lower chest: Nodular airspace opacity of right middle lobe, bilateral lower lobes, similar distribution to prior CT and plain film studies. No pleural effusion. Native coronary calcifications. Hepatobiliary: Liver similar to prior studies. There is trace fluid remaining on the dome of the liver, though significantly decreased in volume from the CT dated 08/26/2017. Pigtail drainage catheter terminates adjacent to segment 6 of the liver. Residual thin fluid within the gallbladder fossa with surgical clips from prior cholecystectomy. Fluid measures 7.2 cm transversely and approximately 17 mm cranial caudal. No intrahepatic or extrahepatic biliary ductal dilatation. Pancreas: Unremarkable pancreas. There is rounded tissue inseparable from the distal pancreatic tail, adjacent to the spleen. The tissue is similar density of spleen tissue. Overall, this focus of tissue is unchanged dating to 2011, and is most likely accessory splenic tissue/splenule. Spleen: Unremarkable spleen Adrenals/Urinary Tract: Unremarkable adrenal glands. Bilateral kidneys atrophic. Low-density lesions, unchanged over the course of several CT studies dating to 2011. None of these lesions are completely  characterized on the current CT. Stomach/Bowel: Enteric contrast within stomach, small bowel, extending to the colon. No extraluminal contrast identified. No transition point. No dilated small bowel or colon. Questionable wall thickening of the length of the colon, poorly characterized with the exclusion of IV contrast. Colonic diverticular change without definite associated inflammatory changes. Gastric tube terminates within the stomach. Vascular/Lymphatic: Calcifications of the abdominal aorta and bilateral iliac arteries. Periaortic lymph nodes, none of which are enlarged. No pelvic adenopathy. No mesenteric adenopathy. Reproductive: Hysterectomy Other: Dependent low-density fluid within the pelvis without evidence of loculation. Volume is unchanged from the comparison CT. Interval resolution of peritoneal air. Stranding of the mesenteric fat associated with small bowel and colon. Stranding of the abdominal wall fat/edema. Musculoskeletal: No acute displaced fracture. Multilevel degenerative changes of the visualized thoracolumbar spine. Degenerative changes of the hips. IMPRESSION: Pigtail drainage catheter at the inferior liver margin, with overall decreased volume of perihepatic fluid in this patient with a history of biloma. There is trace retained fluid in the gallbladder fossa, status post cholecystectomy. Similar volume of left subdiaphragmatic low-density fluid, as well as low-density fluid layered within the dependent pelvis. Neither of these collections appear loculated, potentially representing reactive ascites or  additional sites of bile accumulation, less likely abscess. Questionable colonic wall thickening throughout the length of the colon. This may be seen with anasarca/edema and a positive fluid status, or alternatively could represent inflammatory/infectious colitis. No evidence of bowel obstruction. Multifocal airspace disease of the lung bases, concerning for infection. Mesenteric and body  wall anasarca/edema. Aortic Atherosclerosis (ICD10-I70.0). Electronically Signed   By: Corrie Mckusick D.O.   On: 09/03/2017 17:36   Ct Abdomen Pelvis Wo Contrast  Result Date: 08/26/2017 CLINICAL DATA:  Altered mental status and abdominal pain. Status post cholecystectomy 08/19/2017 EXAM: CT ABDOMEN AND PELVIS WITHOUT CONTRAST TECHNIQUE: Multidetector CT imaging of the abdomen and pelvis was performed following the standard protocol without IV contrast. COMPARISON:  CT scan 08/31/2017 FINDINGS: Lower chest: There are small bilateral pleural effusions and bibasilar atelectasis, right greater than left. The heart is mildly enlarged but stable. Stable coronary artery calcifications. Oral contrast is noted in a slightly dilated esophagus suggesting reflux. Hepatobiliary: No focal hepatic lesions or intrahepatic biliary dilatation. Surgical clips noted from recent cholecystectomy. There is some type of surgical packing (Surgicel) in the gallbladder fossa. Moderate fluid surrounding the liver along with free intraperitoneal air, not unexpected. Small amount of fluid in the pericolic gutters and small to moderate amount of layering fluid and blood in the pelvis. No common bile duct dilatation. Pancreas: No mass, inflammation or ductal dilatation. Moderate pancreatic atrophy. Spleen: Normal size.  No focal lesions. Adrenals/Urinary Tract: The adrenal glands are unremarkable. The kidneys are quite small. Bilateral cysts are again noted. Stomach/Bowel: The stomach is well distended with contrast. No focal abnormalities. The duodenal bulb and C-loop appear normal. The small bowel and colon are grossly normal. No findings for leaking oral contrast to suggest a bowel injury. No obstructive findings. Diffuse colonic diverticulosis without findings for acute diverticulitis. Vascular/Lymphatic: Stable atherosclerotic calcifications involving the aorta and branch vessels. No mesenteric or retroperitoneal mass or adenopathy.  Reproductive: The uterus is surgically absent. Both ovaries are still present and appear normal. Other: Surgical changes involving the anterior abdominal wall. No hematoma or abscess. Musculoskeletal: No significant bony findings. IMPRESSION: 1. Small to moderate of fluid in the abdomen pelvis with some layering blood in the lower pelvis. This is probably normal postoperative change. Could not exclude the possibility of a bile leak. Nuclear medicine hepatobiliary scan may be helpful if patient does not improve. 2. No findings for bowel injury or bowel obstruction. 3. Small bilateral pleural effusions and bibasilar atelectasis. Electronically Signed   By: Marijo Sanes M.D.   On: 08/26/2017 17:48   Ct Abdomen Pelvis Wo Contrast  Result Date: 08/22/2017 CLINICAL DATA:  Right lower quadrant pain. EXAM: CT ABDOMEN AND PELVIS WITHOUT CONTRAST TECHNIQUE: Multidetector CT imaging of the abdomen and pelvis was performed following the standard protocol without IV contrast. COMPARISON:  Lumbar spine x-rays dated September 13, 2015. CT abdomen pelvis dated March 31, 2010. FINDINGS: Lower chest: No acute abnormality. Hepatobiliary: No focal liver abnormality. The gallbladder is distended and contains multiple small gallstones, one of which is seen in the gallbladder neck. Mild gallbladder wall thickening with surrounding pericholecystic fluid. Mild dilatation of the common bile duct, measuring up to 10 mm. Pancreas: Mild atrophy. No ductal dilatation or surrounding inflammatory changes. Spleen: Normal in size without focal abnormality. Adrenals/Urinary Tract: Adrenal glands are unremarkable. Severe atrophy of the bilateral kidneys. No renal or ureteral calculi. No hydronephrosis. The bladder is unremarkable. Stomach/Bowel: Small hiatal hernia. The stomach is distended with oral contrast. No evidence of  bowel wall thickening, distention, or surrounding inflammatory changes. Extensive colonic diverticulosis. Vascular/Lymphatic:  Aortic atherosclerosis. No enlarged abdominal or pelvic lymph nodes. Reproductive: Status post hysterectomy. No adnexal masses. Other: No abdominal wall hernia or abnormality. Trace free fluid in the pelvis. No pneumoperitoneum. Musculoskeletal: Mild superior endplate compression deformities of L1 and L2 appear chronic but are new when compared to prior study. IMPRESSION: 1. Prominently distended gallbladder with a gallstone lodged in the gallbladder neck. Mild gallbladder wall thickening and surrounding pericholecystic fluid. Findings are concerning for acute cholecystitis. 2. Mild dilatation of the common bile duct, measuring up to 10 mm. No obstructing mass or stones seen. 3.  Aortic atherosclerosis (ICD10-I70.0). Electronically Signed   By: Titus Dubin M.D.   On: 08/22/2017 00:03   Ct Head Wo Contrast  Result Date: 08/26/2017 CLINICAL DATA:  Altered mental status post cholecystectomy, history end-stage renal disease, breast cancer, hypertension EXAM: CT HEAD WITHOUT CONTRAST TECHNIQUE: Contiguous axial images were obtained from the base of the skull through the vertex without intravenous contrast. Sagittal and coronal MPR images reconstructed from axial data set. COMPARISON:  09/13/2015 FINDINGS: Brain: Generalized atrophy. Normal ventricular morphology. No midline shift or mass effect. Small vessel chronic ischemic changes of deep cerebral white matter. No intracranial hemorrhage, mass lesion, evidence of acute infarction, or extra-axial fluid collection. Vascular: Atherosclerotic calcifications of internal carotid arteries at skull base. No hyperdense vessels. Skull: Intact Sinuses/Orbits: Clear Other: N/A IMPRESSION: Atrophy with small vessel chronic ischemic changes of deep cerebral white matter. No acute intracranial abnormalities. Electronically Signed   By: Lavonia Dana M.D.   On: 08/26/2017 17:31   Nm Hepatobiliary Liver Func  Result Date: 09/01/2017 CLINICAL DATA:  Prior cholecystectomy.  Postop bile leak. Previous percutaneous drainage catheter placement. Evaluate for persistent bile leak. EXAM: NUCLEAR MEDICINE HEPATOBILIARY IMAGING TECHNIQUE: Sequential images of the abdomen were obtained out to 60 minutes following intravenous administration of radiopharmaceutical. RADIOPHARMACEUTICALS:  5.5 mCi Tc-82m  Choletec IV COMPARISON:  Prior hepatobiliary imaging on 08/27/2017 FINDINGS: Prompt uptake and excretion of activity by the liver is seen. Gallbladder activity is absent, consistent with prior cholecystectomy. Biliary activity initially accumulates and persists in a rounded collection in the right subhepatic space, which appears increased in size compared to previous study. There is no evidence of free intraperitoneal leakage of biliary activity. Subsequent images show passage of some biliary contrast into the small bowel. IMPRESSION: Excreted biliary activity accumulates in a right subhepatic fluid collection, consistent with biloma. This appears increased in size compared to previous study. Consider abdomen pelvis CT for further evaluation. Electronically Signed   By: Earle Gell M.D.   On: 09/01/2017 15:57   Nm Hepatobiliary Liver Func  Result Date: 08/27/2017 CLINICAL DATA:  Cholecystectomy. Persistent abdominal pain and nausea. EXAM: NUCLEAR MEDICINE HEPATOBILIARY IMAGING TECHNIQUE: Sequential images of the abdomen were obtained out to 60 minutes following intravenous administration of radiopharmaceutical. RADIOPHARMACEUTICALS:  5.15 mCi Tc-38m  Choletec IV COMPARISON:  CT 08/26/2017. FINDINGS: Liver appears normal. Pulling of radiopharmaceutical noted about the medial aspect of the liver and within previous identified peroneal fluid. These findings suggest bile leak. IMPRESSION: Findings suggesting bile leak. Electronically Signed   By: Marcello Moores  Register   On: 08/27/2017 16:06   US Abdomen Complete  Result Date: 09/01/2017 CLINICAL DATA:  Bile leak, postoperative. History of  cholecystectomy. History of hypertension. EXAM: ABDOMEN ULTRASOUND COMPLETE COMPARISON:  None. FINDINGS: Gallbladder: Surgically absent. Common bile duct: Diameter: 5 mm Liver: No focal lesion identified. Within normal limits in parenchymal echogenicity.  Small amount of free fluid adjacent to the liver, approximating the gallbladder fossa. Portal vein is patent on color Doppler imaging with normal direction of blood flow towards the liver. IVC: No abnormality visualized. Pancreas: Visualized portion unremarkable. Spleen: Size and appearance within normal limits. Right Kidney: Length: 6 cm. Poorly seen. Echogenic suggesting chronic medical renal disease. Hypoechoic masses within the RIGHT kidney measuring 1.9 cm and 1 cm respectively, likely cysts. Left Kidney: Not seen.  Appeared similarly atrophic on recent CT. Abdominal aorta: No aneurysm visualized. Other findings: Ascites within the LEFT upper quadrant. IMPRESSION: 1. Status post cholecystectomy. Small amount of fluid adjacent to the liver near the cholecystectomy site, and expected amount status post recent cholecystectomy, although nuclear medicine study of 08/27/2017 described a probable bile leak. 2. Atrophic kidneys. Probable RIGHT renal cysts, too small to definitively characterize. Cysts were described on previous CT report. 3. LEFT upper quadrant ascites. Electronically Signed   By: Franki Cabot M.D.   On: 09/01/2017 12:00   Ir US Guide Bx Asp/drain  Result Date: 09/13/2017 INDICATION: 84 year old with cholecystectomy and postoperative bile leak. Plan for drain placement within the abdominal fluid. EXAM: ULTRASOUND-GUIDED PLACEMENT OF DRAINAGE CATHETER IN THE RIGHT ABDOMINAL FLUID MEDICATIONS: None ANESTHESIA/SEDATION: None COMPLICATIONS: None immediate. PROCEDURE: Informed written consent was obtained from the patient's family after a thorough discussion of the procedural risks, benefits and alternatives. All questions were addressed. Maximal  Sterile Barrier Technique was utilized including caps, mask, sterile gowns, sterile gloves, sterile drape, hand hygiene and skin antiseptic. A timeout was performed prior to the initiation of the procedure. Ultrasound demonstrated free fluid along the right lateral abdomen and adjacent to the inferior right hepatic lobe. Large amount of floating bowel in this area. The right side of the abdomen was prepped and draped in sterile fashion. Skin and soft tissues were anesthetized with 1% lidocaine. Yueh catheter was directed into the right abdominal fluid with ultrasound guidance. Dark brown fluid was aspirated. Stiff Amplatz wire was advanced into the abdomen and the tract was dilated to accommodate a 10 [place] multipurpose drain. Sample of fluid was sent for culture. Drain was attached to a gravity bag and sutured to skin. FINDINGS: Free fluid in the right lateral abdomen. No significant fluid around the liver. Drainage catheter was placed within this free fluid. Dark brown fluid was aspirated and most compatible with bile. IMPRESSION: Ultrasound-guided placement of a drainage catheter in the right lateral abdomen. Electronically Signed   By: Markus Daft M.D.   On: 09/09/2017 18:31   Dg Chest Port 1 View  Result Date: 09/08/2017 CLINICAL DATA:  Acute respiratory failure. EXAM: PORTABLE CHEST 1 VIEW COMPARISON:  One day prior FINDINGS: Endotracheal tube terminates 2.5 cm above carina.Nasogastric tube extends beyond the inferior aspect of the film. Right upper quadrant surgical clips. Normal heart size for level of inspiration. Atherosclerosis in the transverse aorta. No pleural effusion or pneumothorax. Right mid lung and left base airspace disease is not significantly changed. No new pulmonary opacities. IMPRESSION: No change in multifocal bilateral airspace disease, most consistent with pneumonia. Aortic Atherosclerosis (ICD10-I70.0). Electronically Signed   By: Abigail Miyamoto M.D.   On: 09/08/2017 09:03   Dg  Chest Port 1 View  Result Date: 09/07/2017 CLINICAL DATA:  Intubated. EXAM: PORTABLE CHEST 1 VIEW COMPARISON:  09/05/2017 FINDINGS: Endotracheal tube in satisfactory position. Nasogastric to side hole in the mid stomach, tip not included. The left jugular catheter has been removed. Normal sized heart. Mild patchy opacity in both lungs  with improvement. Left breast surgical clips and cholecystectomy clips. Thoracic spine degenerative changes. IMPRESSION: Improving bilateral pneumonia. Electronically Signed   By: Claudie Revering M.D.   On: 09/07/2017 08:48   Dg Chest Port 1 View  Result Date: 09/05/2017 CLINICAL DATA:  84 year old with endotracheal intubation. EXAM: PORTABLE CHEST 1 VIEW COMPARISON:  09/04/2017 FINDINGS: Endotracheal tube is 4.3 cm above the carina. Nasogastric tube extends into the abdomen but the tip is beyond the image. Patchy airspace disease in the right lung with slightly improved aeration. Persistent patchy densities at the left lung base. Heart size is within normal limits and stable. Atherosclerotic calcifications at the aortic arch. There is a left jugular dialysis catheter and the tip is near the junction of the SVC and left innominate vein. Again noted is a drainage catheter in the right upper abdomen. IMPRESSION: Improved aeration in the right lung with residual airspace disease. Persistent densities at the left lung base. Dialysis catheter tip is near the junction of the left innominate vein and SVC. Electronically Signed   By: Markus Daft M.D.   On: 09/05/2017 09:09   Dg Chest Port 1 View  Result Date: 09/04/2017 CLINICAL DATA:  Shortness of breath EXAM: PORTABLE CHEST 1 VIEW COMPARISON:  09/03/2017 FINDINGS: Cardiac shadow is stable. Endotracheal tube and nasogastric catheter is well as a left jugular central line are again seen and stable. Diffuse infiltrates are noted bilaterally right greater than left without significant change. No new focal abnormality is noted. IMPRESSION:  Stable bilateral infiltrates. Electronically Signed   By: Inez Catalina M.D.   On: 09/04/2017 08:10   Dg Chest Port 1 View  Result Date: 09/03/2017 CLINICAL DATA:  84 year old female intubated.  Subsequent encounter. EXAM: PORTABLE CHEST 1 VIEW COMPARISON:  09/02/2017 chest x-ray. FINDINGS: Endotracheal tube tip 2.6 cm above the carina. Left central line tip proximal to mid superior vena cava level. Nasogastric tube in place with side hole gastric body level. Tip not included on present exam. Diffuse slightly asymmetric airspace disease without significant change from prior exam. No gross pneumothorax. Heart size top-normal. Calcified aorta. No acute osseous abnormality. Pigtail catheter right upper quadrant. IMPRESSION: Diffuse asymmetric airspace disease without significant change from prior exam. Aortic Atherosclerosis (ICD10-I70.0). Electronically Signed   By: Genia Del M.D.   On: 09/03/2017 06:47   Dg Chest Port 1 View  Result Date: 09/02/2017 CLINICAL DATA:  Followup ventilator support. EXAM: PORTABLE CHEST 1 VIEW COMPARISON:  09/01/2017 FINDINGS: Endotracheal tube tip is 1.5 cm above the carina. Nasogastric tube enters the abdomen. Left internal jugular central line tip is unchanged, in the SVC above the right atrium. Bilateral airspace filling persists, right more than left, probably representing pneumonia. This shows mild generalized improvement. IMPRESSION: Lines and tubes well positioned. Persistent bilateral airspace filling, showing mild generalized improvement. Electronically Signed   By: Nelson Chimes M.D.   On: 09/02/2017 07:10   Dg Chest Port 1 View  Result Date: 09/01/2017 CLINICAL DATA:  Endotracheal tube present EXAM: PORTABLE CHEST 1 VIEW COMPARISON:  Chest x-rays dated 08/31/2017 and 08/30/2017. FINDINGS: Endotracheal tube remains well positioned with tip approximately 2-3 cm above the carina. LEFT IJ central line is stable in position with tip at the level of the mid SVC. Enteric  tube passes below the diaphragm. The bilateral airspace opacities are not significantly changed compared to yesterday's exam, improved compared to the earlier study of 08/30/2017. No new lung findings. No pneumothorax seen. IMPRESSION: 1. Bilateral airspace opacities, pulmonary edema versus pneumonia,  not significantly changed compared to yesterday's exam, but improved compared to the earlier study of 08/30/2017. 2. Support apparatus remains appropriately positioned. Electronically Signed   By: Franki Cabot M.D.   On: 09/01/2017 07:28   Dg Chest Port 1 View  Result Date: 08/31/2017 CLINICAL DATA:  Evaluate ETT.  Respiratory distress. EXAM: PORTABLE CHEST 1 VIEW COMPARISON:  August 30, 2017 FINDINGS: The ETT and left central line are stable. The NG tube terminates below today's film. No pneumothorax. No nodules or masses. Bilateral infiltrates, right greater than left, are stable. IMPRESSION: 1. Support apparatus as above. 2. Persistent significant pulmonary infiltrates, right greater than left. Electronically Signed   By: Dorise Bullion III M.D   On: 08/31/2017 07:23   Dg Chest Port 1 View  Result Date: 08/30/2017 CLINICAL DATA:  LEFT-sided central line placement. EXAM: PORTABLE CHEST 1 VIEW COMPARISON:  08/30/2017. FINDINGS: Unchanged cardiomediastinal silhouette. Calcified tortuous aorta. Significant RIGHT greater than LEFT lung opacities. LEFT-sided hemodialysis catheter was placed via LEFT IJ approach. Tip lies at the cavoatrial junction. There is no pneumothorax. ET tube and nasogastric tube are unchanged. IMPRESSION: Satisfactory LEFT hemodialysis catheter placement. No pneumothorax. Stable aeration. Electronically Signed   By: Staci Righter M.D.   On: 08/30/2017 12:34   Dg Chest Port 1 View  Result Date: 08/30/2017 CLINICAL DATA:  84 year old female postop day 6 from cholecystectomy. Postoperative bilious emesis and hypotension requiring intubation and pressor support. EXAM: PORTABLE CHEST 1 VIEW  COMPARISON:  09/05/2017 and earlier. FINDINGS: Portable AP semi upright view at 0452 hours. It appears the patient has been extubated, no definite ET tube identified. The enteric tube remains in place. Stable lung volumes, but confluent airspace opacity throughout the right lung has progressed in the upper and peripheral lung since yesterday. Patchy and confluent left lung base opacity and perihilar persists and is stable. No pneumothorax or pleural effusion. Stable cardiac size and mediastinal contours. Calcified aortic atherosclerosis. Paucity of bowel gas in the upper abdomen. IMPRESSION: 1. It appears the patient has been extubated, no definite ET tube identified. 2. Stable enteric tube coursing to the abdomen. 3. Progressed and widespread abnormal right lung opacity, as well as left lung base and perihilar opacity. The appearance is suspicious for aspiration and pneumonia in this clinical setting. Electronically Signed   By: Genevie Ann M.D.   On: 08/30/2017 07:34   Dg Chest Port 1 View  Result Date: 08/28/2017 CLINICAL DATA:  ET tube placement EXAM: PORTABLE CHEST 1 VIEW COMPARISON:  09/13/2015 FINDINGS: Endotracheal tube is 2.6 cm above the carina. NG tube is in the stomach. Diffuse right lung airspace disease and airspace disease in the left lower lung. No visible effusions or pneumothorax. Heart is upper limits normal in size. IMPRESSION: New diffuse right lung and left lower lobe airspace disease. Endotracheal tube 2.6 cm above the carina. Electronically Signed   By: Rolm Baptise M.D.   On: 09/14/2017 10:21   Dg Abd Portable 1v  Result Date: 08/27/2017 CLINICAL DATA:  Evaluate gastric tube placement EXAM: PORTABLE ABDOMEN - 1 VIEW COMPARISON:  None. FINDINGS: The bowel gas pattern is normal. The tip and side port of a gastric tube are seen in the body of the stomach. Enteric contrast is noted within small and large bowel loops without obstruction. Heart is enlarged with bibasilar streaky parenchymal  opacities compatible with atelectasis or scarring. Surgical clips are seen in the right upper quadrant of the abdomen and also projecting over the left breast. No acute osseous  abnormality. IMPRESSION: 1. Gastric tube noted in the body of the stomach. 2. Nonobstructed, nondistended bowel gas pattern contrast noted within. 3. Cardiomegaly with bibasilar atelectasis and/or scarring. Electronically Signed   By: Ashley Royalty M.D.   On: 08/27/2017 23:36   Dg Abd Portable 1v  Result Date: 08/27/2017 CLINICAL DATA:  Postop from laparoscopic cholecystectomy. Persistent vomiting. EXAM: PORTABLE ABDOMEN - 1 VIEW COMPARISON:  CT on 08/26/2017 FINDINGS: Oral contrast from recent CT is seen throughout the colon which is nondilated. No evidence of dilated small bowel loops. Right upper quadrant surgical clips are seen from cholecystectomy. IMPRESSION: Normal bowel gas pattern. Electronically Signed   By: Earle Gell M.D.   On: 08/27/2017 09:13    Labs:  CBC: Recent Labs    09/08/17 0120 09/08/17 0655 09/08/17 1936 09/09/17 0400  WBC 27.3* 19.5* 24.9* 16.4*  HGB 8.7* 7.6* 7.7* 9.0*  HCT 28.0* 23.1* 22.1* 25.7*  PLT 154 189 147* 164    COAGS: Recent Labs    08/28/17 1258 09/04/17 0923  09/05/17 2004 09/06/17 0011 09/06/17 0515 09/09/17 0400 09/09/17 0636  INR 1.53 2.98  --   --   --   --   --  8.30*  APTT  --  64*   < > 82* 82* 77* 53*  --    < > = values in this interval not displayed.    BMP: Recent Labs    09/08/17 1552 09/08/17 1920 09/08/17 2345 09/09/17 0400  NA 136 137 139 135  K 3.5 3.3* 3.5 3.7  CL 93* 95* 99* 96*  CO2 19* 22 24 22   GLUCOSE 292* 284* 135* 101*  BUN 34* 34* 22* 17  CALCIUM 8.7* 8.5* 8.0* 7.8*  CREATININE 4.07* 4.04* 2.60* 2.21*  GFRNONAA 9* 9* 16* 20*  GFRAA 11* 11* 19* 23*    LIVER FUNCTION TESTS: Recent Labs    09/05/17 0243 09/06/17 0515 09/07/17 0205 09/08/17 1021 09/09/17 0400  BILITOT 4.0* 3.5*  --  4.0* 5.3*  AST 113* 68*  --  851* 2,141*   ALT 166* 112*  --  257* 613*  ALKPHOS 478* 458*  --  350* 484*  PROT 5.7* 5.5*  --  4.4* 5.1*  ALBUMIN 1.6* 1.5* 1.5* 1.3* 1.7*    TUMOR MARKERS: No results for input(s): AFPTM, CEA, CA199, CHROMGRNA in the last 8760 hours.  Assessment and Plan:  Chlecystectomy 01/21/89 Complicated course: biloma-- drain NSTEMI Aspiration-- vent New; enlarging abdominal fluid collection Scheduled for new drain placement in IR INR 8.3 today-- cannot safely place drain Will watch INR-- IR will call for to when INR trending down and appropriate for procedure Dependent on IR Rad discretion  PA will consent family when need   Thank you for this interesting consult.  I greatly enjoyed meeting ZYLEE MARCHIANO and look forward to participating in their care.  A copy of this report was sent to the requesting provider on this date.  Electronically Signed: Lavonia Drafts, PA-C 09/09/2017, 9:09 AM   I spent a total of 40 Minutes    in face to face in clinical consultation, greater than 50% of which was counseling/coordinating care for abd fluid collection drain placement

## 2017-09-10 ENCOUNTER — Inpatient Hospital Stay (HOSPITAL_COMMUNITY): Payer: Medicare Other | Admitting: Certified Registered Nurse Anesthetist

## 2017-09-10 ENCOUNTER — Encounter (HOSPITAL_COMMUNITY): Payer: Self-pay | Admitting: Certified Registered Nurse Anesthetist

## 2017-09-10 ENCOUNTER — Encounter (HOSPITAL_COMMUNITY): Admission: EM | Disposition: E | Payer: Self-pay | Source: Home / Self Care | Attending: Emergency Medicine

## 2017-09-10 ENCOUNTER — Inpatient Hospital Stay (HOSPITAL_COMMUNITY): Payer: Medicare Other

## 2017-09-10 DIAGNOSIS — K839 Disease of biliary tract, unspecified: Secondary | ICD-10-CM

## 2017-09-10 DIAGNOSIS — K653 Choleperitonitis: Secondary | ICD-10-CM

## 2017-09-10 HISTORY — PX: ERCP: SHX5425

## 2017-09-10 LAB — CBC
HCT: 24.3 % — ABNORMAL LOW (ref 36.0–46.0)
HEMATOCRIT: 21.1 % — AB (ref 36.0–46.0)
HEMOGLOBIN: 7.1 g/dL — AB (ref 12.0–15.0)
HEMOGLOBIN: 8.1 g/dL — AB (ref 12.0–15.0)
MCH: 29.6 pg (ref 26.0–34.0)
MCH: 29.1 pg (ref 26.0–34.0)
MCHC: 33.6 g/dL (ref 30.0–36.0)
MCHC: 33.3 g/dL (ref 30.0–36.0)
MCV: 87.9 fL (ref 78.0–100.0)
MCV: 87.4 fL (ref 78.0–100.0)
Platelets: 106 10*3/uL — ABNORMAL LOW (ref 150–400)
Platelets: 88 10*3/uL — ABNORMAL LOW (ref 150–400)
RBC: 2.4 MIL/uL — AB (ref 3.87–5.11)
RBC: 2.78 MIL/uL — AB (ref 3.87–5.11)
RDW: 17.5 % — ABNORMAL HIGH (ref 11.5–15.5)
RDW: 16.4 % — ABNORMAL HIGH (ref 11.5–15.5)
WBC: 49.1 10*3/uL — ABNORMAL HIGH (ref 4.0–10.5)
WBC: 27.1 10*3/uL — AB (ref 4.0–10.5)

## 2017-09-10 LAB — BPAM FFP
BLOOD PRODUCT EXPIRATION DATE: 201904272359
BLOOD PRODUCT EXPIRATION DATE: 201904272359
Blood Product Expiration Date: 201904272359
Blood Product Expiration Date: 201904272359
Blood Product Expiration Date: 201904272359
Blood Product Expiration Date: 201904272359
ISSUE DATE / TIME: 201904221008
ISSUE DATE / TIME: 201904221008
ISSUE DATE / TIME: 201904221246
ISSUE DATE / TIME: 201904221246
ISSUE DATE / TIME: 201904221825
ISSUE DATE / TIME: 201904222004
UNIT TYPE AND RH: 5100
UNIT TYPE AND RH: 6200
UNIT TYPE AND RH: 5100
Unit Type and Rh: 5100
Unit Type and Rh: 5100
Unit Type and Rh: 5100

## 2017-09-10 LAB — GLUCOSE, CAPILLARY
GLUCOSE-CAPILLARY: 109 mg/dL — AB (ref 65–99)
GLUCOSE-CAPILLARY: 139 mg/dL — AB (ref 65–99)
GLUCOSE-CAPILLARY: 190 mg/dL — AB (ref 65–99)
GLUCOSE-CAPILLARY: 99 mg/dL (ref 65–99)
Glucose-Capillary: 97 mg/dL (ref 65–99)
Glucose-Capillary: 147 mg/dL — ABNORMAL HIGH (ref 65–99)

## 2017-09-10 LAB — RENAL FUNCTION PANEL
Albumin: 2.4 g/dL — ABNORMAL LOW (ref 3.5–5.0)
Anion gap: 14 (ref 5–15)
BUN: 9 mg/dL (ref 6–20)
CO2: 21 mmol/L — AB (ref 22–32)
Calcium: 8.4 mg/dL — ABNORMAL LOW (ref 8.9–10.3)
Chloride: 99 mmol/L — ABNORMAL LOW (ref 101–111)
Creatinine, Ser: 1.2 mg/dL — ABNORMAL HIGH (ref 0.44–1.00)
GFR, EST AFRICAN AMERICAN: 47 mL/min — AB (ref >60)
GFR, EST NON AFRICAN AMERICAN: 41 mL/min — AB (ref >60)
Glucose, Bld: 153 mg/dL — ABNORMAL HIGH (ref 65–99)
POTASSIUM: 3.7 mmol/L (ref 3.5–5.1)
Phosphorus: 3.1 mg/dL (ref 2.5–4.6)
Sodium: 134 mmol/L — ABNORMAL LOW (ref 135–145)

## 2017-09-10 LAB — PREPARE FRESH FROZEN PLASMA
UNIT DIVISION: 0
UNIT DIVISION: 0
Unit division: 0
Unit division: 0
Unit division: 0
Unit division: 0

## 2017-09-10 LAB — PROTIME-INR
INR: 2.38
INR: 2.27
INR: 2.16
Prothrombin Time: 25.8 seconds — ABNORMAL HIGH (ref 11.4–15.2)
Prothrombin Time: 24.8 seconds — ABNORMAL HIGH (ref 11.4–15.2)
Prothrombin Time: 23.9 seconds — ABNORMAL HIGH (ref 11.4–15.2)

## 2017-09-10 LAB — COMPREHENSIVE METABOLIC PANEL
ALK PHOS: 515 U/L — AB (ref 38–126)
ALT: 819 U/L — AB (ref 14–54)
AST: 3665 U/L — AB (ref 15–41)
Albumin: 2.3 g/dL — ABNORMAL LOW (ref 3.5–5.0)
Anion gap: 15 (ref 5–15)
BUN: 7 mg/dL (ref 6–20)
CALCIUM: 8.4 mg/dL — AB (ref 8.9–10.3)
CHLORIDE: 99 mmol/L — AB (ref 101–111)
CO2: 20 mmol/L — AB (ref 22–32)
CREATININE: 1.3 mg/dL — AB (ref 0.44–1.00)
GFR calc Af Amer: 43 mL/min — ABNORMAL LOW (ref >60)
GFR, EST NON AFRICAN AMERICAN: 37 mL/min — AB (ref >60)
Glucose, Bld: 100 mg/dL — ABNORMAL HIGH (ref 65–99)
Potassium: 4.2 mmol/L (ref 3.5–5.1)
SODIUM: 134 mmol/L — AB (ref 135–145)
Total Bilirubin: 8.6 mg/dL — ABNORMAL HIGH (ref 0.3–1.2)
Total Protein: 5.7 g/dL — ABNORMAL LOW (ref 6.5–8.1)

## 2017-09-10 LAB — LACTIC ACID, PLASMA: Lactic Acid, Venous: 5.5 mmol/L (ref 0.5–1.9)

## 2017-09-10 LAB — BLOOD GAS, ARTERIAL
Acid-base deficit: 2.8 mmol/L — ABNORMAL HIGH (ref 0.0–2.0)
Bicarbonate: 22.1 mmol/L (ref 20.0–28.0)
Drawn by: 39899
FIO2: 30
O2 Saturation: 97.5 %
PATIENT TEMPERATURE: 98.6
PCO2 ART: 42.6 mmHg (ref 32.0–48.0)
PEEP: 5 cmH2O
RATE: 20 resp/min
VT: 400 mL
pH, Arterial: 7.335 — ABNORMAL LOW (ref 7.350–7.450)
pO2, Arterial: 95 mmHg (ref 83.0–108.0)

## 2017-09-10 LAB — MAGNESIUM: Magnesium: 2.3 mg/dL (ref 1.7–2.4)

## 2017-09-10 LAB — PREPARE RBC (CROSSMATCH)

## 2017-09-10 SURGERY — ERCP, WITH INTERVENTION IF INDICATED
Anesthesia: General

## 2017-09-10 MED ORDER — ROCURONIUM BROMIDE 10 MG/ML (PF) SYRINGE
PREFILLED_SYRINGE | INTRAVENOUS | Status: DC | PRN
  Administered 2017-09-10: 40 mg via INTRAVENOUS
  Administered 2017-09-10: 50 mg via INTRAVENOUS

## 2017-09-10 MED ORDER — DEXTROSE 5 % IV SOLN
0.0000 ug/min | INTRAVENOUS | Status: DC
  Administered 2017-09-10: 14 ug/min via INTRAVENOUS
  Administered 2017-09-11: 22 ug/min via INTRAVENOUS
  Administered 2017-09-11: 16 ug/min via INTRAVENOUS
  Administered 2017-09-12: 6 ug/min via INTRAVENOUS
  Administered 2017-09-15: 10 ug/min via INTRAVENOUS
  Administered 2017-09-16: 16.96 ug/min via INTRAVENOUS
  Administered 2017-09-17: 18 ug/min via INTRAVENOUS
  Administered 2017-09-18: 8 ug/min via INTRAVENOUS
  Administered 2017-09-21: 2 ug/min via INTRAVENOUS
  Administered 2017-09-23: 40 ug/min via INTRAVENOUS
  Filled 2017-09-10 (×11): qty 16

## 2017-09-10 MED ORDER — GLUCAGON HCL RDNA (DIAGNOSTIC) 1 MG IJ SOLR
INTRAMUSCULAR | Status: DC | PRN
Start: 2017-09-10 — End: 2017-09-10
  Administered 2017-09-10: .25 mL via INTRAVENOUS

## 2017-09-10 MED ORDER — GLUCAGON HCL RDNA (DIAGNOSTIC) 1 MG IJ SOLR
INTRAMUSCULAR | Status: AC
  Filled 2017-09-10: qty 1

## 2017-09-10 MED ORDER — IOPAMIDOL (ISOVUE-300) INJECTION 61%
INTRAVENOUS | Status: AC
  Filled 2017-09-10: qty 100

## 2017-09-10 MED ORDER — FENTANYL 2500MCG IN NS 250ML (10MCG/ML) PREMIX INFUSION
25.0000 ug/h | INTRAVENOUS | Status: DC
  Administered 2017-09-10: 300 ug/h via INTRAVENOUS
  Administered 2017-09-10: 200 ug/h via INTRAVENOUS
  Administered 2017-09-10: 400 ug/h via INTRAVENOUS
  Administered 2017-09-11 – 2017-09-12 (×2): 150 ug/h via INTRAVENOUS
  Administered 2017-09-13 – 2017-09-14 (×3): 100 ug/h via INTRAVENOUS
  Administered 2017-09-15 – 2017-09-17 (×3): 150 ug/h via INTRAVENOUS
  Administered 2017-09-17: 50 ug/h via INTRAVENOUS
  Administered 2017-09-17: 200 ug/h via INTRAVENOUS
  Administered 2017-09-18: 250 ug/h via INTRAVENOUS
  Administered 2017-09-18: 150 ug/h via INTRAVENOUS
  Administered 2017-09-19: 50 ug/h via INTRAVENOUS
  Administered 2017-09-20: 125 ug/h via INTRAVENOUS
  Administered 2017-09-21: 175 ug/h via INTRAVENOUS
  Administered 2017-09-22 (×2): 125 ug/h via INTRAVENOUS
  Filled 2017-09-10 (×19): qty 250

## 2017-09-10 MED ORDER — SODIUM CHLORIDE 0.9 % IV SOLN
INTRAVENOUS | Status: DC
  Administered 2017-09-10: 14:00:00 via INTRAVENOUS

## 2017-09-10 MED ORDER — SODIUM CHLORIDE 0.9 % IV SOLN
Freq: Once | INTRAVENOUS | Status: AC
  Administered 2017-09-10: 12:00:00 via INTRAVENOUS

## 2017-09-10 MED ORDER — SODIUM CHLORIDE 0.9 % IV SOLN: Freq: Once | INTRAVENOUS | Status: AC

## 2017-09-10 MED ORDER — STERILE WATER FOR INJECTION IV SOLN
INTRAVENOUS | Status: AC
  Administered 2017-09-10: 17:00:00 via INTRAVENOUS
  Filled 2017-09-10: qty 265.44

## 2017-09-10 MED ORDER — SODIUM CHLORIDE 0.9 % IV BOLUS
500.0000 mL | Freq: Once | INTRAVENOUS | Status: AC
  Administered 2017-09-10: 500 mL via INTRAVENOUS

## 2017-09-10 MED ORDER — SODIUM CHLORIDE 0.9 % IV SOLN
INTRAVENOUS | Status: DC | PRN
  Administered 2017-09-10: 20 mL

## 2017-09-10 MED ORDER — INDOMETHACIN 50 MG RE SUPP
RECTAL | Status: AC
  Filled 2017-09-10: qty 2

## 2017-09-10 MED ORDER — SODIUM CHLORIDE 0.9 % IV SOLN
Freq: Once | INTRAVENOUS | Status: AC
  Administered 2017-09-10: 22:00:00 via INTRAVENOUS

## 2017-09-10 MED ORDER — SODIUM CHLORIDE 0.9 % IV SOLN: Freq: Once | INTRAVENOUS | Status: DC

## 2017-09-10 MED ORDER — INDOMETHACIN 50 MG RE SUPP
RECTAL | Status: DC | PRN
  Administered 2017-09-10: 100 mg via RECTAL

## 2017-09-10 MED ORDER — SODIUM CHLORIDE 0.9 % IV SOLN
Freq: Once | INTRAVENOUS | Status: AC
  Administered 2017-09-10: 09:00:00 via INTRAVENOUS

## 2017-09-10 NOTE — Progress Notes (Signed)
Progress Note   Subjective  No new events 1 u pRBC this am already 2 u FFP, repeat INR pending More Vit K given today On CVVHD Not current sedated 7 mcg norepi gtt No further bleeding IR had planned for additional perc drains today    Objective   Vital signs in last 24 hours: Temp:  [96 F (35.6 C)-97.6 F (36.4 C)] 96.7 F (35.9 C) (04/23 0954) Pulse Rate:  [73-131] 80 (04/23 0714) Resp:  [25-49] 43 (04/23 1030) BP: (71-144)/(33-99) 93/44 (04/23 1030) SpO2:  [87 %-100 %] 100 % (04/23 1030) FiO2 (%):  [30 %-100 %] 30 % (04/23 0800) Weight:  [142 lb 13.7 oz (64.8 kg)] 142 lb 13.7 oz (64.8 kg) (04/23 0407) Last BM Date: 09/15/2017 Gen: intubated, somnolent, responds to painful stimuli HEENT: mildly icteric, ET and OG CV: regular Pulm: course b/l Abd: soft, drainage cath in place right abd, hypoactive bowel sounds Ext: minimal LE edema  Lab Results  Component Value Date   INR 2.38 08/20/2017   INR 2.14 09/09/2017   INR 8.30 (HH) 09/09/2017     Intake/Output from previous day: 04/22 0701 - 04/23 0700 In: 4693.3 [I.V.:2436.3; Blood:1812; NG/GT:65; IV Piggyback:250] Out: 4742 [Drains:45; Stool:25] Intake/Output this shift: Total I/O In: 543.9 [I.V.:228.9; Blood:235; NG/GT:30; IV Piggyback:50] Out: 595 [Other:566]  Lab Results: Recent Labs    09/08/17 1936 09/09/17 0400 08/26/2017 0435  WBC 24.9* 16.4* 49.1*  HGB 7.7* 9.0* 7.1*  HCT 22.1* 25.7* 21.1*  PLT 147* 164 106*   BMET Recent Labs    09/09/17 0400 09/09/17 2056 08/28/2017 0435  NA 135 136 134*  K 3.7 4.3 4.2  CL 96* 98* 99*  CO2 22 19* 20*  GLUCOSE 101* 113* 100*  BUN 17 9 7   CREATININE 2.21* 1.43* 1.30*  CALCIUM 7.8* 8.4* 8.4*   LFT Recent Labs    09/08/17 1021  08/31/2017 0435  PROT 4.4*   < > 5.7*  ALBUMIN 1.3*   < > 2.3*  AST 851*   < > 3,665*  ALT 257*   < > 819*  ALKPHOS 350*   < > 515*  BILITOT 4.0*   < > 8.6*  BILIDIR 2.3*  --   --   IBILI 1.7*  --   --    < > = values in  this interval not displayed.   PT/INR Recent Labs    09/09/17 1619 09/08/2017 0435  LABPROT 23.7* 25.8*  INR 2.14 2.38    Studies/Results: Ct Abdomen Pelvis Wo Contrast  Result Date: 09/08/2017 CLINICAL DATA:  Elevated lactic acid. Bloody stools. Status post cholecystectomy August 24, 2017. History of breast cancer, end-stage renal disease on dialysis. EXAM: CT ABDOMEN AND PELVIS WITHOUT CONTRAST TECHNIQUE: Multidetector CT imaging of the abdomen and pelvis was performed following the standard protocol without IV contrast. COMPARISON:  CT abdomen and pelvis September 03, 2017. FINDINGS: LOWER CHEST: Multifocal lung base consolidation improved from prior examination. Heart size is normal. Moderate coronary artery calcifications. No pericardial effusion. HEPATOBILIARY: Nodular hepatic contour. Marked interval increase in size large perihepatic heterogeneous fluid collection largest component measuring 16.7 x 6.8 cm (24 Hounsfield units). Fluid collection is contiguous with gallbladder fossa, status post cholecystectomy. PANCREAS: Normal. SPLEEN: Large perisplenic fluid collection. ADRENALS/URINARY TRACT: Kidneys are orthotopic, extremely atrophic. Bilateral renal cysts measuring to 16 mm on the RIGHT. No nephrolithiasis, hydronephrosis; limited assessment for renal masses on this nonenhanced examination. The unopacified ureters are normal in course and caliber. Urinary bladder is  partially distended and unremarkable. Normal adrenal glands. STOMACH/BOWEL: Nasogastric tube tip terminates in mid stomach. The stomach, small and large bowel are normal in course and caliber without inflammatory changes, sensitivity decreased by lack of enteric contrast. Moderate colonic diverticulosis. VASCULAR/LYMPHATIC: Aortoiliac vessels are normal in course and caliber. Mild calcific atherosclerosis. LEFT femoral central venous catheter placed. REPRODUCTIVE: Status post hysterectomy. 10 mm LEFT adnexal cyst, no routine indicated  follow-up. OTHER: RIGHT upper quadrant pigtail drainage CT catheter posteriorly abutting and possibly within the hepatic fluid collection. Multiple heterogeneous intraperitoneal fluid collections along the anterior abdominal wall largest measuring 13.4 x 5.6 cm which appears to communicate with the hepatic collection. Small volume ascites within pelvis. MUSCULOSKELETAL: Non-acute. Osteopenia. Multiple old mild compression fractures and old Schmorl's nodes. Anterior abdominal wall scarring. IMPRESSION: 1. Marked interval growth of large perihepatic and perisplenic fluid collections with multiple new intraperitoneal fluid collections compatible with abscess, less likely hematoma. RIGHT pigtail drainage catheter abutting perihepatic fluid collection. 2. Status post cholecystectomy. 3. Resolving multifocal pneumonia. 4. These results will be called to the ordering clinician or representative by the Radiologist Assistant, and communication documented in the PACS or zVision Dashboard. Electronically Signed   By: Elon Alas M.D.   On: 09/08/2017 18:57   Dg Chest Port 1 View  Result Date: 08/23/2017 CLINICAL DATA:  Endotracheal tube EXAM: PORTABLE CHEST 1 VIEW COMPARISON:  09/08/2017 FINDINGS: Endotracheal tube and NG tube are unchanged. Low lung volumes, bibasilar atelectasis and vascular congestion. Heart is normal size. No visible effusions. IMPRESSION: Low lung volumes with vascular congestion and bibasilar atelectasis. Electronically Signed   By: Rolm Baptise M.D.   On: 09/05/2017 07:48       Assessment / Plan:   84 yo female with ESRD, s/p lap chole complicated by bile leak, aspiration PNA and respiratory failure, bilomas s/p drains with recurrent and enlarging abd fluid collection, LUE DVT, GI blood loss from unclear source, coagulopathy.  Ongoing issues resultant from ongoing bile leak.  ERCP has been discussed at length with ICU team, biliary endoscopist Dr. Fuller Plan, and her son Mateo Flow.  This is  a very high risk procedure, but without ERCP I do not see a possibility of survival as the bile leak will continue.  Even with ERCP she will remain critically ill.  The plan will be to avoid sphincterotomy and place bile duct stent.  Will need full anesthesia support.  Have discussed with anesthesia MD, Dr. Ola Spurr.    Risks reviewed in detail with family (son Mateo Flow in person and Milbert Coulter by phone) including death, GI bleeding, infection, perforation, pancreatitis, unsuccessful bile duct cannulation. Ample time for discussion and questions allowed. The patient's son understood, was satisfied, and agreed to proceed.       Principal Problem:   Acute cholecystitis Active Problems:   ESRD on dialysis [facility])   Essential hypertension   GERD (gastroesophageal reflux disease)   Bile leak, postoperative   Acute respiratory failure with hypoxia (HCC)   Aspiration into airway   ESRD on hemodialysis (HCC)   Malnutrition of moderate degree   Septic shock (HCC)   NSTEMI (non-ST elevated myocardial infarction) ([place])   Shock liver   Anastomotic leak of biliary tree   Coagulopathy ([place])     LOS: 19 days   Lajuan Lines Kadance Mccuistion  09/06/2017, 10:44 AM

## 2017-09-10 NOTE — Progress Notes (Signed)
Assessment/Plan: 1 Acute cholecystitis - s/p lap chole 4/6. Bile duct leak sp IR biliary drain 09/02/2017 for IR intervention for enlarging intraabdominal collections; also possible ERCP intervention 2 Acute resp failure/ asp PNA - VDRF 3 Septic shock - 4 ESRD - Back on CRRT due to hypotension and TNA, keep even; cont current CRRT RX 5 Anemia of CKD-Hgb low. S/P prbc's  6 Volume - keeping even on CRRT 7 LUE DVT - acute L IJ DVT, Cath dc'd  Subjective: Interval History: Interventions planned  Objective: Vital signs in last 24 hours: Temp:  [96 F (35.6 C)-97.6 F (36.4 C)] 97 F (36.1 C) (04/23 1235) Pulse Rate:  [73-131] 80 (04/23 1201) Resp:  [20-49] 25 (04/23 1235) BP: (71-144)/(33-99) 133/58 (04/23 1235) SpO2:  [90 %-100 %] 100 % (04/23 1235) FiO2 (%):  [30 %-100 %] 30 % (04/23 1201) Weight:  [64.8 kg (142 lb 13.7 oz)] 64.8 kg (142 lb 13.7 oz) (04/23 0407) Weight change: 0 kg (0 lb)  Intake/Output from previous day: 04/22 0701 - 04/23 0700 In: 4693.3 [I.V.:2436.3; Blood:1812; NG/GT:65; IV Piggyback:250] Out: 9629 [Drains:45; Stool:25] Intake/Output this shift: Total I/O In: 1252.9 [I.V.:443.9; Blood:679; NG/GT:30; IV Piggyback:100] Out: 869 [Other:869]  General appearance: unresp[onsive No change  Lab Results: Recent Labs    09/09/17 0400 08/20/2017 0435  WBC 16.4* 49.1*  HGB 9.0* 7.1*  HCT 25.7* 21.1*  PLT 164 106*   BMET:  Recent Labs    09/09/17 2056 08/20/2017 0435  NA 136 134*  K 4.3 4.2  CL 98* 99*  CO2 19* 20*  GLUCOSE 113* 100*  BUN 9 7  CREATININE 1.43* 1.30*  CALCIUM 8.4* 8.4*   No results for input(s): PTH in the last 72 hours. Iron Studies: No results for input(s): IRON, TIBC, TRANSFERRIN, FERRITIN in the last 72 hours. Studies/Results: Ct Abdomen Pelvis Wo Contrast  Result Date: 09/08/2017 CLINICAL DATA:  Elevated lactic acid. Bloody stools. Status post cholecystectomy August 24, 2017. History of breast cancer, end-stage renal disease on  dialysis. EXAM: CT ABDOMEN AND PELVIS WITHOUT CONTRAST TECHNIQUE: Multidetector CT imaging of the abdomen and pelvis was performed following the standard protocol without IV contrast. COMPARISON:  CT abdomen and pelvis September 03, 2017. FINDINGS: LOWER CHEST: Multifocal lung base consolidation improved from prior examination. Heart size is normal. Moderate coronary artery calcifications. No pericardial effusion. HEPATOBILIARY: Nodular hepatic contour. Marked interval increase in size large perihepatic heterogeneous fluid collection largest component measuring 16.7 x 6.8 cm (24 Hounsfield units). Fluid collection is contiguous with gallbladder fossa, status post cholecystectomy. PANCREAS: Normal. SPLEEN: Large perisplenic fluid collection. ADRENALS/URINARY TRACT: Kidneys are orthotopic, extremely atrophic. Bilateral renal cysts measuring to 16 mm on the RIGHT. No nephrolithiasis, hydronephrosis; limited assessment for renal masses on this nonenhanced examination. The unopacified ureters are normal in course and caliber. Urinary bladder is partially distended and unremarkable. Normal adrenal glands. STOMACH/BOWEL: Nasogastric tube tip terminates in mid stomach. The stomach, small and large bowel are normal in course and caliber without inflammatory changes, sensitivity decreased by lack of enteric contrast. Moderate colonic diverticulosis. VASCULAR/LYMPHATIC: Aortoiliac vessels are normal in course and caliber. Mild calcific atherosclerosis. LEFT femoral central venous catheter placed. REPRODUCTIVE: Status post hysterectomy. 10 mm LEFT adnexal cyst, no routine indicated follow-up. OTHER: RIGHT upper quadrant pigtail drainage CT catheter posteriorly abutting and possibly within the hepatic fluid collection. Multiple heterogeneous intraperitoneal fluid collections along the anterior abdominal wall largest measuring 13.4 x 5.6 cm which appears to communicate with the hepatic collection. Small volume ascites within  pelvis.  MUSCULOSKELETAL: Non-acute. Osteopenia. Multiple old mild compression fractures and old Schmorl's nodes. Anterior abdominal wall scarring. IMPRESSION: 1. Marked interval growth of large perihepatic and perisplenic fluid collections with multiple new intraperitoneal fluid collections compatible with abscess, less likely hematoma. RIGHT pigtail drainage catheter abutting perihepatic fluid collection. 2. Status post cholecystectomy. 3. Resolving multifocal pneumonia. 4. These results will be called to the ordering clinician or representative by the Radiologist Assistant, and communication documented in the PACS or zVision Dashboard. Electronically Signed   By: Elon Alas M.D.   On: 09/08/2017 18:57   Dg Chest Port 1 View  Result Date: 08/28/2017 CLINICAL DATA:  Status post central line placement EXAM: PORTABLE CHEST 1 VIEW COMPARISON:  Portable chest x-ray of today's date at 5:08 a.m. FINDINGS: The patient has undergone placement of a right internal jugular venous catheter. The tip projects over the junction of the middle and distal thirds of the SVC. The lung volumes remain low. There is no pneumothorax or pleural effusion. The interstitial markings are coarse throughout the right lung and at the left lung base. The endotracheal tube tip projects 3.6 cm above the carina. The esophagogastric tube tip and proximal port project below the inferior margin of the image. IMPRESSION: No postprocedure complication following right internal jugular venous catheter placement. The other support tubes are in reasonable position. Electronically Signed   By: David  Martinique M.D.   On: 09/01/2017 12:10   Dg Chest Port 1 View  Result Date: 08/31/2017 CLINICAL DATA:  Endotracheal tube EXAM: PORTABLE CHEST 1 VIEW COMPARISON:  09/08/2017 FINDINGS: Endotracheal tube and NG tube are unchanged. Low lung volumes, bibasilar atelectasis and vascular congestion. Heart is normal size. No visible effusions. IMPRESSION: Low lung  volumes with vascular congestion and bibasilar atelectasis. Electronically Signed   By: Rolm Baptise M.D.   On: 09/06/2017 07:48    Scheduled: . chlorhexidine gluconate (MEDLINE KIT)  15 mL Mouth Rinse BID  . Chlorhexidine Gluconate Cloth  6 each Topical Daily  . darbepoetin (ARANESP) injection - DIALYSIS  150 mcg Intravenous Q Mon-HD  . doxercalciferol  2 mcg Intravenous Q M,W,F-HD  . insulin aspart  0-9 Units Subcutaneous Q4H  . mouth rinse  15 mL Mouth Rinse 10 times per day  . pantoprazole  40 mg Intravenous Q12H  . sodium chloride flush  10-40 mL Intracatheter Q12H      LOS: 19 days   Estanislado Emms 09/17/2017,12:38 PM

## 2017-09-10 NOTE — Op Note (Signed)
[facility] Patient Name: Pam Fuller Procedure Date : 08/31/2017 MRN: [id number] Attending MD: Ladene Artist , MD Date of Birth: [birth year] CSN: [id number] Age: 84 Admit Type: Inpatient Procedure:                ERCP Indications:              Bile leak Providers:                [name] Plan, MD, Cleda Daub, RN, Kingsley Plan, RN, Charolette Child, Technician Referring MD:             Leighton Ruff. Redmond Pulling, MD Medicines:                General Anesthesia Complications:            No immediate complications. Estimated Blood Loss:     Estimated blood loss: none. Procedure:                Pre-Anesthesia Assessment:                           - Prior to the procedure, a History and Physical                            was performed, and patient medications and                            allergies were reviewed. The patient's tolerance of                            previous anesthesia was also reviewed. The risks                            and benefits of the procedure and the sedation                            options and risks were discussed with the patient.                            All questions were answered, and informed consent                            was obtained. Prior Anticoagulants: The patient has                            taken no previous anticoagulant or antiplatelet                            agents. ASA Grade Assessment: IV - A patient with                            severe systemic disease that is a constant threat  to life. After reviewing the risks and benefits,                            the patient was deemed in satisfactory condition to                            undergo the procedure.                           After obtaining informed consent, the scope was                            passed under direct vision. Throughout the                            procedure, the patient's blood  pressure, pulse, and                            oxygen saturations were monitored continuously. The                            [id number] scope was introduced through the                            mouth, and used to inject contrast into and used to                            inject contrast into the bile duct. The ERCP was                            accomplished without difficulty. The patient                            tolerated the procedure well. Scope In: Scope Out: Findings:      A scout film of the abdomen was obtained. Surgical clips, consistent       with a previous cholecystectomy, were seen in the area of the right       upper quadrant of the abdomen. The esophagus was successfully intubated       under direct vision. The scope was advanced to a prominent but otherwise       normal appearing major papilla in the descending duodenum without       detailed examination of the pharynx, larynx and associated structures,       and upper GI tract. The upper GI tract was grossly normal except for       several gastric fundus, body and antrum erosions and gastric mucosal       lesions c/w gastric tube suction markings. A straight Roadrunner wire       was passed into the biliary tree. The short-nosed traction       sphincterotome was passed over the guidewire and the bile duct was then       deeply cannulated. Contrast was injected. I personally interpreted the       bile duct images. There was appropriate flow of contrast through the  ducts. The common bile duct was mildy and diffusely dilated, uncertain       significance. The largest diameter was 10 mm. The common bile duct       contained filling defects thought to be sludge. Extravasation of       contrast originating from the cystic duct was observed. Prior       cholecystectomy. A sphincterotomy was not performed to clear the CBD due       to a persistent coagulopathy, high bleeding risk. One 8.5 Fr by 5 cm        plastic stent with a single external flap and a single internal flap was       placed 4 cm into the common bile duct. Bile and sludge flowed through       the stent. Adequate biliary drainage was noted. The stent was in good       position. The PD was not entered by intention. Impression:               - Erosive gastritis and gastric mucosal lesions c/w                            gastric tube suction markings.                           - Suspicious for CBD sludge.                           - The common bile duct was mildly dilated,                            uncertain significance.                           - Prior cholecystectomy.                           - A bile leak was found at the cystic duct.                           - One plastic stent was placed into the common bile                            duct. Recommendation:           - Return patient to ICU for ongoing care.                           - Proceed with drainage of bilomas, fluid                            collections.                           - Biliary stent removal in about 8 weeks, pending                            course. Procedure Code(s):        --- Professional ---  93570, Endoscopic retrograde                            cholangiopancreatography (ERCP); with placement of                            endoscopic stent into biliary or pancreatic duct,                            including pre- and post-dilation and guide wire                            passage, when performed, including sphincterotomy,                            when performed, each stent Diagnosis Code(s):        --- Professional ---                           K83.8, Other specified diseases of biliary tract                           K83.9, Disease of biliary tract, unspecified CPT copyright 2017 American Medical Association. All rights reserved. The codes documented in this report are preliminary and upon coder review may  be  revised to meet current compliance requirements. Ladene Artist, MD 08/19/2017 2:32:09 PM This report has been signed electronically. Number of Addenda: 0

## 2017-09-10 NOTE — H&P (View-Only) (Signed)
Progress Note   Subjective  No new events 1 u pRBC this am already 2 u FFP, repeat INR pending More Vit K given today On CVVHD Not current sedated 7 mcg norepi gtt No further bleeding IR had planned for additional perc drains today    Objective   Vital signs in last 24 hours: Temp:  [96 F (35.6 C)-97.6 F (36.4 C)] 96.7 F (35.9 C) (04/23 0954) Pulse Rate:  [73-131] 80 (04/23 0714) Resp:  [25-49] 43 (04/23 1030) BP: (71-144)/(33-99) 93/44 (04/23 1030) SpO2:  [87 %-100 %] 100 % (04/23 1030) FiO2 (%):  [30 %-100 %] 30 % (04/23 0800) Weight:  [142 lb 13.7 oz (64.8 kg)] 142 lb 13.7 oz (64.8 kg) (04/23 0407) Last BM Date: 09/14/2017 Gen: intubated, somnolent, responds to painful stimuli HEENT: mildly icteric, ET and OG CV: regular Pulm: course b/l Abd: soft, drainage cath in place right abd, hypoactive bowel sounds Ext: minimal LE edema  Lab Results  Component Value Date   INR 2.38 08/28/2017   INR 2.14 09/09/2017   INR 8.30 (HH) 09/09/2017     Intake/Output from previous day: 04/22 0701 - 04/23 0700 In: 4693.3 [I.V.:2436.3; Blood:1812; NG/GT:65; IV Piggyback:250] Out: 3419 [Drains:45; Stool:25] Intake/Output this shift: Total I/O In: 543.9 [I.V.:228.9; Blood:235; NG/GT:30; IV Piggyback:50] Out: 379 [Other:566]  Lab Results: Recent Labs    09/08/17 1936 09/09/17 0400 09/07/2017 0435  WBC 24.9* 16.4* 49.1*  HGB 7.7* 9.0* 7.1*  HCT 22.1* 25.7* 21.1*  PLT 147* 164 106*   BMET Recent Labs    09/09/17 0400 09/09/17 2056 09/03/2017 0435  NA 135 136 134*  K 3.7 4.3 4.2  CL 96* 98* 99*  CO2 22 19* 20*  GLUCOSE 101* 113* 100*  BUN 17 9 7   CREATININE 2.21* 1.43* 1.30*  CALCIUM 7.8* 8.4* 8.4*   LFT Recent Labs    09/08/17 1021  09/07/2017 0435  PROT 4.4*   < > 5.7*  ALBUMIN 1.3*   < > 2.3*  AST 851*   < > 3,665*  ALT 257*   < > 819*  ALKPHOS 350*   < > 515*  BILITOT 4.0*   < > 8.6*  BILIDIR 2.3*  --   --   IBILI 1.7*  --   --    < > = values in  this interval not displayed.   PT/INR Recent Labs    09/09/17 1619 09/05/2017 0435  LABPROT 23.7* 25.8*  INR 2.14 2.38    Studies/Results: Ct Abdomen Pelvis Wo Contrast  Result Date: 09/08/2017 CLINICAL DATA:  Elevated lactic acid. Bloody stools. Status post cholecystectomy August 24, 2017. History of breast cancer, end-stage renal disease on dialysis. EXAM: CT ABDOMEN AND PELVIS WITHOUT CONTRAST TECHNIQUE: Multidetector CT imaging of the abdomen and pelvis was performed following the standard protocol without IV contrast. COMPARISON:  CT abdomen and pelvis September 03, 2017. FINDINGS: LOWER CHEST: Multifocal lung base consolidation improved from prior examination. Heart size is normal. Moderate coronary artery calcifications. No pericardial effusion. HEPATOBILIARY: Nodular hepatic contour. Marked interval increase in size large perihepatic heterogeneous fluid collection largest component measuring 16.7 x 6.8 cm (24 Hounsfield units). Fluid collection is contiguous with gallbladder fossa, status post cholecystectomy. PANCREAS: Normal. SPLEEN: Large perisplenic fluid collection. ADRENALS/URINARY TRACT: Kidneys are orthotopic, extremely atrophic. Bilateral renal cysts measuring to 16 mm on the RIGHT. No nephrolithiasis, hydronephrosis; limited assessment for renal masses on this nonenhanced examination. The unopacified ureters are normal in course and caliber. Urinary bladder is  partially distended and unremarkable. Normal adrenal glands. STOMACH/BOWEL: Nasogastric tube tip terminates in mid stomach. The stomach, small and large bowel are normal in course and caliber without inflammatory changes, sensitivity decreased by lack of enteric contrast. Moderate colonic diverticulosis. VASCULAR/LYMPHATIC: Aortoiliac vessels are normal in course and caliber. Mild calcific atherosclerosis. LEFT femoral central venous catheter placed. REPRODUCTIVE: Status post hysterectomy. 10 mm LEFT adnexal cyst, no routine indicated  follow-up. OTHER: RIGHT upper quadrant pigtail drainage CT catheter posteriorly abutting and possibly within the hepatic fluid collection. Multiple heterogeneous intraperitoneal fluid collections along the anterior abdominal wall largest measuring 13.4 x 5.6 cm which appears to communicate with the hepatic collection. Small volume ascites within pelvis. MUSCULOSKELETAL: Non-acute. Osteopenia. Multiple old mild compression fractures and old Schmorl's nodes. Anterior abdominal wall scarring. IMPRESSION: 1. Marked interval growth of large perihepatic and perisplenic fluid collections with multiple new intraperitoneal fluid collections compatible with abscess, less likely hematoma. RIGHT pigtail drainage catheter abutting perihepatic fluid collection. 2. Status post cholecystectomy. 3. Resolving multifocal pneumonia. 4. These results will be called to the ordering clinician or representative by the Radiologist Assistant, and communication documented in the PACS or zVision Dashboard. Electronically Signed   By: Elon Alas M.D.   On: 09/08/2017 18:57   Dg Chest Port 1 View  Result Date: 09/14/2017 CLINICAL DATA:  Endotracheal tube EXAM: PORTABLE CHEST 1 VIEW COMPARISON:  09/08/2017 FINDINGS: Endotracheal tube and NG tube are unchanged. Low lung volumes, bibasilar atelectasis and vascular congestion. Heart is normal size. No visible effusions. IMPRESSION: Low lung volumes with vascular congestion and bibasilar atelectasis. Electronically Signed   By: Rolm Baptise M.D.   On: 09/01/2017 07:48       Assessment / Plan:   84 yo female with ESRD, s/p lap chole complicated by bile leak, aspiration PNA and respiratory failure, bilomas s/p drains with recurrent and enlarging abd fluid collection, LUE DVT, GI blood loss from unclear source, coagulopathy.  Ongoing issues resultant from ongoing bile leak.  ERCP has been discussed at length with ICU team, biliary endoscopist Dr. Fuller Plan, and her son Mateo Flow.  This is  a very high risk procedure, but without ERCP I do not see a possibility of survival as the bile leak will continue.  Even with ERCP she will remain critically ill.  The plan will be to avoid sphincterotomy and place bile duct stent.  Will need full anesthesia support.  Have discussed with anesthesia MD, Dr. Ola Spurr.    Risks reviewed in detail with family (son Mateo Flow in person and Milbert Coulter by phone) including death, GI bleeding, infection, perforation, pancreatitis, unsuccessful bile duct cannulation. Ample time for discussion and questions allowed. The patient's son understood, was satisfied, and agreed to proceed.       Principal Problem:   Acute cholecystitis Active Problems:   ESRD on dialysis [facility])   Essential hypertension   GERD (gastroesophageal reflux disease)   Bile leak, postoperative   Acute respiratory failure with hypoxia (HCC)   Aspiration into airway   ESRD on hemodialysis (HCC)   Malnutrition of moderate degree   Septic shock (HCC)   NSTEMI (non-ST elevated myocardial infarction) ([place])   Shock liver   Anastomotic leak of biliary tree   Coagulopathy ([place])     LOS: 19 days   Lajuan Lines Markiyah Gahm  09/02/2017, 10:44 AM

## 2017-09-10 NOTE — Progress Notes (Signed)
Pt suddenly became markedly hypotensive, this is unchanged despite increasing levophed, decreasing bair hugger temp, and changing BP cuff sites.   MD Charlynn Grimes made aware. Order for STAT ABG and additional pressor received  Lorre Munroe

## 2017-09-10 NOTE — Progress Notes (Signed)
[place] Progress Note Patient Name: Pam Fuller DOB: May 03, 1933 MRN: [id number]   Date of Service  08/23/2017  HPI/Events of Note  INR 2.38, procedure this am  eICU Interventions  FFP 2u ordered     Intervention Category Intermediate Interventions: Other:  Collene Gobble 09/13/2017, 5:53 AM

## 2017-09-10 NOTE — Progress Notes (Signed)
PULMONARY / CRITICAL CARE MEDICINE   Name: Pam Fuller MRN: [id number] DOB: 1933-03-06    ADMISSION DATE:  09/09/2017 CONSULTATION DATE:  4/11  REFERRING MD:  Dr. Maudie Mercury  CHIEF COMPLAINT:  Aspiration  BRIEF SUMMARY:   [name] is a 84 year old female with ESRD on MWF HD, HTN, Anemia, and hx of breast cancer who presented to the ED on 4/4 with RLQ abdominal pain. She was found to have acute cholecystitis with a gallstone lodged in the gallbladder neck. CBD measured up to 10 mm. She underwent laparoscopic cholecystectomy on 4/6. She initially did well but on 4/8 was noted to have worsened pain and confusion and thought to have post-op ileus. CT abd/pelvis 4/8 showed small-mod fluid in the abdominal pelvis, read as normal post op changes but could not exclude a bile leak. A hepatobiliary scan was obtained on 4/9 which did suggest a bile leak. Surgery recommended IR drain and GI consult for ERCP. She went to the Endoscopy unit on 4/11 AM for ERCP however during prep with anesthesia she had significant bilious emesis with hypotension requiring intubation and pressor support. ERCP was not attempted. She was subsequently transferred to the medical ICU.   STUDIES:  CT abd/pelv 4/4 >> acute cholecystitis, gallstone in GB neck, CBD 10 mm  CT Head: no acute changes, chronic small vessel ischemic changes  CT abd/pelvis 4/8 >> small-mod fluid in abd/pelvis  Hepatobiliary scan 4/9 >> bile leak  TTE 4/12 >> EF 65-70%, G1DD, narrow LVOT, mild TR, mild pHTN  U/S Abdomen 4/14 >> small fluid near cholecystectomy site  Hepatobiliary scan 4/14 >> Rt subhepatic fluid collection consistent w/ biloma; increased in size from prior  CT abd/pelvis w/ oral contrast 4/16 >> drainage catheter at inferior liver margin with decreased perihepatic fluid; low-density fluid at left subdiaphragmatic area, questionable colonic wall thickening throughout, multifocal airspace disease of lung bases  CT  abdomen.pelvis 4/21: interval growth of large perihepatic and perisplenic fluid collection with multiple, new intraperitoneal fluid collections. Resolving multifocal pneumonia.    CULTURES: Tracheal aspirate 4/11 >> no growth Abd fluid 4/11 >> no growth 5 days Abd fluid 4/15 >> no growth 3 days  ANTIBIOTICS: Zosyn 4/3 >> 4/7 Unasyn 4/11 Zosyn 4/12 - 4/19 Zosyn 4/21-  LINES/TUBES: ETT 4/11 OGT 4/11 PIV RUE 4/9 RLQ Biliary Drain 4/11 HD Cath LIJ 4/12 >> 4/19 L femoral trialysis catheter 4/21>  SIGNIFICANT EVENTS: 4/4 - admitted w/ acute cholecystitis 4/6 - lap chole 4/9 - bile leak seen on hepatobiliary scan 4/11 - Aspirated during prep for ERCP, intubated and transferred to ICU 4/11 - Rt abd fluid drain placed by IR 4/12- Heparin gtt started for NSTEMI; held after OG bleeding and trop peak 4/13 - worsening liver enzyymes. ESRD - Anuric. CRRT started. On levophed 88mcg and fent 140mcg 4/14 - Hgb 6.0, transfused 2 units PRBCs 4/17 - DVT LUE - started bivalirudin for suspected HIT 4/18 - CVVH d/c'ed, return to intermittent HD. HIT 4/19 - Restarted HD  4/20 - Bleeding from GB drain, OGT, and stools. Received 1u pRBC. Heparin gtt held. 4/21 - Multiple, new fluid collections found on CT abd/pelvis    SUBJECTIVE/OVERNIGHT/INTERVAL HX -  BP remains labile on levophed. Getting fentanyl and versed pushes. Not responsive to verbal stimuli this AM.    VITAL SIGNS: BP (!) 106/48   Pulse 73   Temp (!) (P) 97.2 F (36.2 C) (Axillary)   Resp (!) 37   Ht 5\' 2"  (1.575 m)  Wt 142 lb 13.7 oz (64.8 kg)   SpO2 100%   BMI 26.13 kg/m   HEMODYNAMICS:    VENTILATOR SETTINGS: Vent Mode: PRVC FiO2 (%):  [30 %-100 %] 30 % Set Rate:  [27 bmp] 27 bmp Vt Set:  [300 mL] 300 mL PEEP:  [5 cmH20] 5 cmH20  INTAKE / OUTPUT: I/O last 3 completed shifts: In: 5894.6 [I.V.:3533.6; Blood:1776; Other:160; NG/GT:125; IV Piggyback:300] Out: 3893 [Emesis/NG output:50; Drains:20; JIRCV:8938;  Stool:25]  PHYSICAL EXAMINATION:  General: patient is sedated and unresponsive to verbal stimuli  HEENT: NCAT, ET tube in place  Cardiac:  RRR, no mrg  Pulm: tachypneic, decreased breath sounds at bases, no wheezing  Abd: pain on abdomen palpation, soft, decreased bowel sounds  Ext: LUE edema L>>R markedly worse than yesterday with loss of radial pulse on LUE, L AVF with palpable thrill, femoral trialysis cathter in place with dressing c/d/i Neuro:  R pupil 75mm, L pupil 24mm, does not arouse to voice but grimaces to pain, moves all 4 extremities spontaneously but no purposefully, does not follow commands     PULMONARY Recent Labs  Lab 09/08/17 0945 09/09/17 0952  PHART 7.253* 7.330*  PCO2ART 25.9* 28.4*  PO2ART 166* 118.0*  HCO3 11.1* 15.1*  TCO2  --  16*  O2SAT 98.6 99.0    CBC Recent Labs  Lab 09/08/17 1936 09/09/17 0400 09/02/2017 0435  HGB 7.7* 9.0* 7.1*  HCT 22.1* 25.7* 21.1*  WBC 24.9* 16.4* 49.1*  PLT 147* 164 106*    COAGULATION Recent Labs  Lab 09/04/17 0923 09/09/17 0636 09/09/17 1619 09/11/2017 0435  INR 2.98 8.30* 2.14 2.38    CARDIAC   Recent Labs  Lab 09/09/17 1011  TROPONINI 0.49*   No results for input(s): PROBNP in the last 168 hours.   CHEMISTRY Recent Labs  Lab 09/06/17 1619 09/07/17 0205 09/07/17 0720  09/08/17 1017  09/08/17 1920 09/08/17 2345 09/09/17 0400 09/09/17 2056 09/01/2017 0435  NA  --  132*  --    < > 137   < > 137 139 135 136 134*  K  --  2.9*  --    < > 4.5   < > 3.3* 3.5 3.7 4.3 4.2  CL  --  94*  --    < > 96*   < > 95* 99* 96* 98* 99*  CO2  --  26  --    < > 12*   < > 22 24 22  19* 20*  GLUCOSE  --  129*  --    < > 35*   < > 284* 135* 101* 113* 100*  BUN  --  12  --    < > 29*   < > 34* 22* 17 9 7   CREATININE  --  2.11*  --    < > 4.03*   < > 4.04* 2.60* 2.21* 1.43* 1.30*  CALCIUM  --  8.0*  --    < > 9.2   < > 8.5* 8.0* 7.8* 8.4* 8.4*  MG 1.9 1.8 2.5*  --  2.8*  --   --   --  2.4  --  2.3  PHOS 4.3 3.7 4.1  --    --   --   --   --  3.5 3.2  --    < > = values in this interval not displayed.   Estimated Creatinine Clearance: 29.5 mL/min (A) (by C-G formula based on SCr of 1.3 mg/dL (H)).  I/O last 3 completed  shifts: In: 5894.6 [I.V.:3533.6; Blood:1776; Other:160; NG/GT:125; IV Piggyback:300] Out: 3893 [Emesis/NG output:50; Drains:20; BJYNW:2956; Stool:25]   Intake/Output Summary (Last 24 hours) at 09/02/2017 0645 Last data filed at 09/08/2017 0600 Gross per 24 hour  Intake 4718.96 ml  Output 3982 ml  Net 736.96 ml     LIVER Recent Labs  Lab 09/04/17 0923  09/05/17 0243 09/06/17 0515 09/07/17 0205 09/08/17 1021 09/09/17 0400 09/09/17 0636 09/09/17 1619 09/09/17 2056 08/22/2017 0435  AST  --   --  113* 68*  --  851* 2,141*  --   --   --  3,665*  ALT  --   --  166* 112*  --  257* 613*  --   --   --  819*  ALKPHOS  --   --  478* 458*  --  350* 484*  --   --   --  515*  BILITOT  --   --  4.0* 3.5*  --  4.0* 5.3*  --   --   --  8.6*  PROT  --   --  5.7* 5.5*  --  4.4* 5.1*  --   --   --  5.7*  ALBUMIN  --    < > 1.6* 1.5* 1.5* 1.3* 1.7*  --   --  2.3* 2.3*  INR 2.98  --   --   --   --   --   --  8.30* 2.14  --  2.38   < > = values in this interval not displayed.     INFECTIOUS Recent Labs  Lab 09/08/17 1552  LATICACIDVEN 10.8*     ENDOCRINE CBG (last 3)  Recent Labs    09/09/17 1928 09/09/17 2340 09/11/2017 0426  GLUCAP 102* 99 109*     IMAGING x48h  - image(s) personally visualized  -   highlighted in bold Ct Abdomen Pelvis Wo Contrast  Result Date: 09/08/2017 CLINICAL DATA:  Elevated lactic acid. Bloody stools. Status post cholecystectomy August 24, 2017. History of breast cancer, end-stage renal disease on dialysis. EXAM: CT ABDOMEN AND PELVIS WITHOUT CONTRAST TECHNIQUE: Multidetector CT imaging of the abdomen and pelvis was performed following the standard protocol without IV contrast. COMPARISON:  CT abdomen and pelvis September 03, 2017. FINDINGS: LOWER CHEST: Multifocal  lung base consolidation improved from prior examination. Heart size is normal. Moderate coronary artery calcifications. No pericardial effusion. HEPATOBILIARY: Nodular hepatic contour. Marked interval increase in size large perihepatic heterogeneous fluid collection largest component measuring 16.7 x 6.8 cm (24 Hounsfield units). Fluid collection is contiguous with gallbladder fossa, status post cholecystectomy. PANCREAS: Normal. SPLEEN: Large perisplenic fluid collection. ADRENALS/URINARY TRACT: Kidneys are orthotopic, extremely atrophic. Bilateral renal cysts measuring to 16 mm on the RIGHT. No nephrolithiasis, hydronephrosis; limited assessment for renal masses on this nonenhanced examination. The unopacified ureters are normal in course and caliber. Urinary bladder is partially distended and unremarkable. Normal adrenal glands. STOMACH/BOWEL: Nasogastric tube tip terminates in mid stomach. The stomach, small and large bowel are normal in course and caliber without inflammatory changes, sensitivity decreased by lack of enteric contrast. Moderate colonic diverticulosis. VASCULAR/LYMPHATIC: Aortoiliac vessels are normal in course and caliber. Mild calcific atherosclerosis. LEFT femoral central venous catheter placed. REPRODUCTIVE: Status post hysterectomy. 10 mm LEFT adnexal cyst, no routine indicated follow-up. OTHER: RIGHT upper quadrant pigtail drainage CT catheter posteriorly abutting and possibly within the hepatic fluid collection. Multiple heterogeneous intraperitoneal fluid collections along the anterior abdominal wall largest measuring 13.4 x 5.6 cm which appears to communicate with  the hepatic collection. Small volume ascites within pelvis. MUSCULOSKELETAL: Non-acute. Osteopenia. Multiple old mild compression fractures and old Schmorl's nodes. Anterior abdominal wall scarring. IMPRESSION: 1. Marked interval growth of large perihepatic and perisplenic fluid collections with multiple new intraperitoneal  fluid collections compatible with abscess, less likely hematoma. RIGHT pigtail drainage catheter abutting perihepatic fluid collection. 2. Status post cholecystectomy. 3. Resolving multifocal pneumonia. 4. These results will be called to the ordering clinician or representative by the Radiologist Assistant, and communication documented in the PACS or zVision Dashboard. Electronically Signed   By: Elon Alas M.D.   On: 09/08/2017 18:57   Dg Chest Port 1 View  Result Date: 09/08/2017 CLINICAL DATA:  Acute respiratory failure. EXAM: PORTABLE CHEST 1 VIEW COMPARISON:  One day prior FINDINGS: Endotracheal tube terminates 2.5 cm above carina.Nasogastric tube extends beyond the inferior aspect of the film. Right upper quadrant surgical clips. Normal heart size for level of inspiration. Atherosclerosis in the transverse aorta. No pleural effusion or pneumothorax. Right mid lung and left base airspace disease is not significantly changed. No new pulmonary opacities. IMPRESSION: No change in multifocal bilateral airspace disease, most consistent with pneumonia. Aortic Atherosclerosis (ICD10-I70.0). Electronically Signed   By: Abigail Miyamoto M.D.   On: 09/08/2017 09:03     DISCUSSION: [name] is a 84 year old female with ESRD on MWF HD, HTN, Anemia, and hx of breast cancer who was admitted 4/4 with acute cholecystitis. S/p lap chole 4/6 complicated by post-op bile leak. Went for ERCP 4/11 however developed bilious emesis during prep, hypotension, and required intubation plus pressor support. Transferred to the ICU on 4/11.  ASSESSMENT / PLAN: PULMONARY A: Acute Respiratory Distress 2/2 Aspiration Requiring Mechanical Ventilation since 4/11 P:   - Failed SBT 4/21. Continue vent support for now  - CT abd/pelvis 4/21  with improvement in pneumonia, but new intraperitoneal fluid collections  - VAP protocol   CARDIOVASCULAR A:  #baseline Aortic Atherosclerosis on CXR RBBB - present prior to  admission #current Septic Shock 2/2 intra-peritoneal abscesses  Troponin elevation (peak 4.47) 2/2 demand ischemia in setting of hypotensive event - resolved  Echocardiogram w/ EF 65-70% Prolonged QTc LUE DVT - heparin started 4/19 and stopped 4/21 P:  - Levophed restarted 4/21  - D/c Midodrine - MAP goal > 65 - Heparin stopped in the setting of acute blood loss 4/21   RENAL A:   ESRD on MWF HD AVF LUE P:   - CRRT restarted on 4/21 - Nephrology following  - Follow up output and monitor electrolytes  - Replace electrolytes as indicated  GASTROINTESTINAL A:   Acute Cholecystitis s/p lap chole 4/6 Bile leak/biliary peritonitis s/p IR Rt abdominal fluid drain 4/11 - biloma by HIDA 4/14 Shock Liver Multiple new intraperitoneal abscesses/biloma found on 4/21 Nutrition SUP P:   - INR now improved to 2.3 s/p 4u FFP + vitamin K x1. Repeat INR 2.3.  - S/p 2 more units of FFP this AM. Ordering 2 more  - Goal INR for IR is <1.5. However, unlikely to achieve this goal in the setting of shock liver  - Tentative plan for perc drain by IR tomorrow  - Plan for ERCP today. GI to speak to family today regarding plan of care  - Zosyn 4/121>  - Right abdominal fluid drain in place with minimal drainage, might be clotted  - Appreciate surgery and GI  assistance - Continue to monitor transaminases  - Tube feeds stopped 4/20 2/2 vominting. Unable to  start TPN at this time due to lack of access. Will plan to place another line today after ERCP.  - Continue Protonix as ordered   HEMATOLOGIC A:   Hx of Breast Cancer - no acute issue Anemia of Chronic Disease -  S/p 2 units PRBCs 4/14 Leukocytosis - markedly worse WBC 49 this AM  Thrombocytopenia - Plt 106 LUE (IJV) DVT Acute blood loss anemia s/p 2 units PRBCs 4/21 P:  - Transfusing 1 unit this AM  - Heparin stopped 4/20 - HGB goal > 7.0 - CBC daily  - Bivalirudin stopped 4/20, heparin initiated after HIT antibody was negative - Left  IJ temporary HD catheter removed 4/19 - L femoral trialysis catheter placed on 4/21 - Placing RIJ this AM for TPN   INFECTIOUS A:   Septic Shock from Aspiration PNA - resolved  Bile Leak s/p IR Biliary Drain 4/11 Septic shock 2/2 intraperitoneal abscesses/biloma, interval growth of known one and multiple new ones  P:   - Zosyn 4/12 >> 4/19 - Restarted Zosyn 4/21 - Plan for ERCP today and hopefully IR tomorrow for placement of perc drain   ENDOCRINE A:   Secondary Hyperparathyroidism Hypoglycemia - likely 2/2 shock liver. Improving  P:   - Tube feeds stopped 4/20. Unable to start TPN due to lack of access, placing new line today  - Continue D10  - Monitoring transaminases  - CBG monitoring q4h   NEUROLOGIC A:   Pain/Agitation Anisocoria  P:   - Starting Fentanyl gtt  - RASS goal: 0 to -1 - Fentanyl and Versed as needed   FAMILY  - Updates: Sons updated at bedside 4/23.    Welford Roche, MD  Internal Medicine PGY-1  P [phone]

## 2017-09-10 NOTE — Interval H&P Note (Signed)
History and Physical Interval Note:  08/20/2017 1:49 PM  Pam Fuller  has presented today for surgery, with the diagnosis of bile leak  The various methods of treatment have been discussed with the patient and family. After consideration of risks, benefits and other options for treatment, the patient has consented to  Procedure(s): ENDOSCOPIC RETROGRADE CHOLANGIOPANCREATOGRAPHY (ERCP) (N/A) as a surgical intervention .  The patient's history has been reviewed, patient examined, no change in status, stable for surgery.  I have reviewed the patient's chart and labs.  Questions were answered to the patient's satisfaction.     [name] Plan

## 2017-09-10 NOTE — Anesthesia Preprocedure Evaluation (Addendum)
Anesthesia Evaluation  Patient identified by MRN, date of birth, ID band Patient unresponsive    Reviewed: Allergy & Precautions, NPO status , Patient's Chart, lab work & pertinent test results  History of Anesthesia Complications Negative for: history of anesthetic complications  Airway Mallampati: Intubated  TM Distance: <3 FB Neck ROM: Limited  Mouth opening: Limited Mouth Opening  Dental  (+) Teeth Intact   Pulmonary neg pulmonary ROS,    breath sounds clear to auscultation       Cardiovascular hypertension, Pt. on medications (-) angina(-) Past MI and (-) CHF (-) dysrhythmias  Rhythm:Regular Rate:Normal     Neuro/Psych negative neurological ROS     GI/Hepatic GERD  Medicated,  Endo/Other    Renal/GU ESRF and DialysisRenal diseaseHd fri without difficulty, lue access     Musculoskeletal  (+) Arthritis ,   Abdominal   Peds  Hematology  (+) anemia ,   Anesthesia Other Findings   Reproductive/Obstetrics                             Anesthesia Physical  Anesthesia Plan  ASA: IV  Anesthesia Plan: General   Post-op Pain Management:    Induction: Intravenous  PONV Risk Score and Plan: 3 and Ondansetron, Dexamethasone and Treatment may vary due to age or medical condition  Airway Management Planned: Oral ETT  Additional Equipment: None  Intra-op Plan:   Post-operative Plan: Post-operative intubation/ventilation  Informed Consent: I have reviewed the patients History and Physical, chart, labs and discussed the procedure including the risks, benefits and alternatives for the proposed anesthesia with the patient or authorized representative who has indicated his/her understanding and acceptance.   Dental advisory given  Plan Discussed with: CRNA and Surgeon  Anesthesia Plan Comments:        Anesthesia Quick Evaluation

## 2017-09-10 NOTE — Progress Notes (Signed)
[facility] Progress Note  12 Days Post-Op  Subjective: CC: sepsis Hemodynamically unstable, requiring levophed Patient remains on the ventilator. Heparin off; getting 2u ffp today CT 4/21 showed growth of perihepatic and perisplenic fluid collections - IR consulted. Patient had another large bloody BM overnight.  Minimal output from RUQ drain.  Objective: Vital signs in last 24 hours: Temp:  [96.4 F (35.8 C)-97.6 F (36.4 C)] 97.2 F (36.2 C) (04/23 6962) Pulse Rate:  [72-131] 73 (04/23 0445) Resp:  [22-49] 35 (04/23 0642) BP: (68-144)/(33-82) 106/48 (04/23 0630) SpO2:  [87 %-100 %] 100 % (04/23 0642) FiO2 (%):  [30 %-100 %] 30 % (04/23 0447) Weight:  [64.8 kg (142 lb 13.7 oz)] 64.8 kg (142 lb 13.7 oz) (04/23 0407) Last BM Date: 09/03/2017  Intake/Output from previous day: 04/22 0701 - 04/23 0700 In: 4607.7 [I.V.:2350.7; Blood:1812; NG/GT:65; IV Piggyback:250] Out: 3823 [Drains:45; Stool:25] Intake/Output this shift: Total I/O In: 1561.5 [I.V.:971.5; Blood:490; IV Piggyback:100] Out: 1361 [Drains:25; Other:1336]  PE: Gen: on the vent, somnolent ENT: sclera icteric bilaterally Card:  Regular rate and irregular rhythm Pulm:  Slightly tachypneic, inspiratory crackles in bases bilaterally  Abd: Soft, non-distended, incisions C/D/I, drain in RUQ with minimal bloody output Skin: warm and dry, no rashes    Lab Results:  Recent Labs    09/09/17 0400 09/09/2017 0435  WBC 16.4* 49.1*  HGB 9.0* 7.1*  HCT 25.7* 21.1*  PLT 164 106*   BMET Recent Labs    09/09/17 2056 09/14/2017 0435  NA 136 134*  K 4.3 4.2  CL 98* 99*  CO2 19* 20*  GLUCOSE 113* 100*  BUN 9 7  CREATININE 1.43* 1.30*  CALCIUM 8.4* 8.4*   PT/INR Recent Labs    09/09/17 1619 09/13/2017 0435  LABPROT 23.7* 25.8*  INR 2.14 2.38   CMP     Component Value Date/Time   NA 134 (L) 08/19/2017 0435   NA 142 02/12/2017 1400   K 4.2 09/03/2017 0435   K 3.9 02/12/2017 1400   CL 99 (L)  08/23/2017 0435   CO2 20 (L) 08/25/2017 0435   CO2 30 (H) 02/12/2017 1400   GLUCOSE 100 (H) 09/16/2017 0435   GLUCOSE 88 02/12/2017 1400   BUN 7 09/13/2017 0435   BUN 16.9 02/12/2017 1400   CREATININE 1.30 (H) 09/05/2017 0435   CREATININE 6.2 (HH) 02/12/2017 1400   CALCIUM 8.4 (L) 09/15/2017 0435   CALCIUM 9.2 02/12/2017 1400   PROT 5.7 (L) 08/20/2017 0435   PROT 6.8 02/12/2017 1400   ALBUMIN 2.3 (L) 09/01/2017 0435   ALBUMIN 3.3 (L) 02/12/2017 1400   AST 3,665 (H) 09/13/2017 0435   AST 13 02/12/2017 1400   ALT 819 (H) 08/20/2017 0435   ALT 7 02/12/2017 1400   ALKPHOS 515 (H) 09/13/2017 0435   ALKPHOS 75 02/12/2017 1400   BILITOT 8.6 (H) 09/14/2017 0435   BILITOT 0.68 02/12/2017 1400   GFRNONAA 37 (L) 09/02/2017 0435   GFRAA 43 (L) 09/01/2017 0435   Lipase     Component Value Date/Time   LIPASE 36 09/04/2017 1618       Studies/Results: Ct Abdomen Pelvis Wo Contrast  Result Date: 09/08/2017 CLINICAL DATA:  Elevated lactic acid. Bloody stools. Status post cholecystectomy August 24, 2017. History of breast cancer, end-stage renal disease on dialysis. EXAM: CT ABDOMEN AND PELVIS WITHOUT CONTRAST TECHNIQUE: Multidetector CT imaging of the abdomen and pelvis was performed following the standard protocol without IV contrast. COMPARISON:  CT abdomen and pelvis September 03, 2017. FINDINGS: LOWER CHEST: Multifocal lung base consolidation improved from prior examination. Heart size is normal. Moderate coronary artery calcifications. No pericardial effusion. HEPATOBILIARY: Nodular hepatic contour. Marked interval increase in size large perihepatic heterogeneous fluid collection largest component measuring 16.7 x 6.8 cm (24 Hounsfield units). Fluid collection is contiguous with gallbladder fossa, status post cholecystectomy. PANCREAS: Normal. SPLEEN: Large perisplenic fluid collection. ADRENALS/URINARY TRACT: Kidneys are orthotopic, extremely atrophic. Bilateral renal cysts measuring to 16 mm on  the RIGHT. No nephrolithiasis, hydronephrosis; limited assessment for renal masses on this nonenhanced examination. The unopacified ureters are normal in course and caliber. Urinary bladder is partially distended and unremarkable. Normal adrenal glands. STOMACH/BOWEL: Nasogastric tube tip terminates in mid stomach. The stomach, small and large bowel are normal in course and caliber without inflammatory changes, sensitivity decreased by lack of enteric contrast. Moderate colonic diverticulosis. VASCULAR/LYMPHATIC: Aortoiliac vessels are normal in course and caliber. Mild calcific atherosclerosis. LEFT femoral central venous catheter placed. REPRODUCTIVE: Status post hysterectomy. 10 mm LEFT adnexal cyst, no routine indicated follow-up. OTHER: RIGHT upper quadrant pigtail drainage CT catheter posteriorly abutting and possibly within the hepatic fluid collection. Multiple heterogeneous intraperitoneal fluid collections along the anterior abdominal wall largest measuring 13.4 x 5.6 cm which appears to communicate with the hepatic collection. Small volume ascites within pelvis. MUSCULOSKELETAL: Non-acute. Osteopenia. Multiple old mild compression fractures and old Schmorl's nodes. Anterior abdominal wall scarring. IMPRESSION: 1. Marked interval growth of large perihepatic and perisplenic fluid collections with multiple new intraperitoneal fluid collections compatible with abscess, less likely hematoma. RIGHT pigtail drainage catheter abutting perihepatic fluid collection. 2. Status post cholecystectomy. 3. Resolving multifocal pneumonia. 4. These results will be called to the ordering clinician or representative by the Radiologist Assistant, and communication documented in the PACS or zVision Dashboard. Electronically Signed   By: Elon Alas M.D.   On: 09/08/2017 18:57   Dg Chest Port 1 View  Result Date: 09/08/2017 CLINICAL DATA:  Acute respiratory failure. EXAM: PORTABLE CHEST 1 VIEW COMPARISON:  One day  prior FINDINGS: Endotracheal tube terminates 2.5 cm above carina.Nasogastric tube extends beyond the inferior aspect of the film. Right upper quadrant surgical clips. Normal heart size for level of inspiration. Atherosclerosis in the transverse aorta. No pleural effusion or pneumothorax. Right mid lung and left base airspace disease is not significantly changed. No new pulmonary opacities. IMPRESSION: No change in multifocal bilateral airspace disease, most consistent with pneumonia. Aortic Atherosclerosis (ICD10-I70.0). Electronically Signed   By: Abigail Miyamoto M.D.   On: 09/08/2017 09:03    Anti-infectives: Anti-infectives (From admission, onward)   Start     Dose/Rate Route Frequency Ordered Stop   09/08/17 2000  piperacillin-tazobactam (ZOSYN) IVPB 3.375 g     3.375 g 100 mL/hr over 30 Minutes Intravenous Every 6 hours 09/08/17 1946     09/05/17 2000  piperacillin-tazobactam (ZOSYN) IVPB 3.375 g     3.375 g 12.5 mL/hr over 240 Minutes Intravenous Every 12 hours 09/05/17 1126 09/07/17 0154   08/31/17 0800  piperacillin-tazobactam (ZOSYN) IVPB 3.375 g  Status:  Discontinued     3.375 g 100 mL/hr over 30 Minutes Intravenous Every 6 hours 08/31/17 0739 09/05/17 1126   08/30/17 2000  Ampicillin-Sulbactam (UNASYN) 3 g in sodium chloride 0.9 % 100 mL IVPB  Status:  Discontinued     3 g 200 mL/hr over 30 Minutes Intravenous Every 24 hours 08/25/2017 0953 08/30/17 0945   08/30/17 1230  piperacillin-tazobactam (ZOSYN) IVPB 3.375 g  Status:  Discontinued  3.375 g 12.5 mL/hr over 240 Minutes Intravenous Every 12 hours 08/30/17 1134 08/31/17 0738   08/26/2017 1030  Ampicillin-Sulbactam (UNASYN) 3 g in sodium chloride 0.9 % 100 mL IVPB     3 g 200 mL/hr over 30 Minutes Intravenous  Once 08/23/2017 0953 09/02/2017 1246   08/31/2017 0000  ampicillin-sulbactam (UNASYN) 1.5 g in sodium chloride 0.9 % 100 mL IVPB  Status:  Discontinued     1.5 g 200 mL/hr over 30 Minutes Intravenous Once 08/28/17 1045 08/27/2017  0928   08/20/2017 2200  piperacillin-tazobactam (ZOSYN) IVPB 3.375 g    Note to Pharmacy:  Zosyn 3.375 g IV q12h in ESRD on HD   3.375 g 12.5 mL/hr over 240 Minutes Intravenous Every 12 hours 09/17/2017 1155 08/25/17 1335   08/22/17 1000  piperacillin-tazobactam (ZOSYN) IVPB 3.375 g  Status:  Discontinued    Note to Pharmacy:  Zosyn 3.375 g IV q12h in ESRD on HD   3.375 g 12.5 mL/hr over 240 Minutes Intravenous Every 12 hours 08/22/17 0128 09/09/2017 1155   08/22/17 0045  piperacillin-tazobactam (ZOSYN) IVPB 3.375 g     3.375 g 12.5 mL/hr over 240 Minutes Intravenous  Once 08/22/17 0038 08/22/17 0145       Assessment/Plan HTN ESRD - HD MWF H/o breast cancer  Acute cholecystitis POD14s/p lap chole4/6/19 by Dr. Redmond Pulling - CT scan 4/8 w/ small amount fluid around liver and layering in pelvis ; HIDA 4/9 positive for bile leak - CT 4/21 with multiple fluid collections - IR to evaluate today for possible drain placement Shock liver -Total bilirubin uptrending  VDRF secondary to aspiration/PNA -CCMfollowing - appreciate assistance.   Leukocytosis Likely secondary to aspiration PNA and bile leak (which is fairly well controlled with her currently drain) On Zosyn. WBC down to 16 today  ABL Anemia - Hgb 9.0, s/p 2 units PRBC yesterday - INR 8, heparin being held - large bloody stool yesterday  Lactic acidosis - on bicarb drip, appreciate CCM assistance  ID -zosyn 4/4>>4/7,Unasyn 4/11-->4/12, Zosyn 4/12 --> FEN -NPO/OGT- TF held yesterday for emesis  VTE -SCDs Foley -none, anuric Follow up -Dr. Karleen Dolphin & GI to reevaluate this week. Continue current drain and abx. Monitor Hgb    LOS: 19 days   Pam Roup, MD

## 2017-09-10 NOTE — Progress Notes (Signed)
CRITICAL VALUE ALERT  Critical Value: Lactic Acid 5.5  Date & Time Notied: 08/25/2017 1945  Provider Notified: Dr. Posey Pronto  Orders Received/Actions taken: No new orders at this time

## 2017-09-10 NOTE — Progress Notes (Signed)
[facility] Progress Note Patient Name: Pam Fuller DOB: 08/03/1932 MRN: [id number]   Date of Service  08/23/2017  HPI/Events of Note  INR remains elevated.   eICU Interventions  Transfuse 2u FFP, repeat INR.      Intervention Category Intermediate Interventions: Coagulopathy - evaluation and management  Laverle Hobby 09/01/2017, 8:20 PM

## 2017-09-10 NOTE — Procedures (Signed)
Central Venous Catheter Insertion Procedure Note Pam Fuller [id number] 1933/02/19  Procedure: Insertion of Central Venous Catheter Indications: Assessment of intravascular volume, Drug and/or fluid administration and Frequent blood sampling  Procedure Details Consent: Risks of procedure as well as the alternatives and risks of each were explained to the (patient/caregiver).  Consent for procedure obtained. Time Out: Verified patient identification, verified procedure, site/side was marked, verified correct patient position, special equipment/implants available, medications/allergies/relevent history reviewed, required imaging and test results available.  Performed  Maximum sterile technique was used including antiseptics, cap, gloves, gown, hand hygiene, mask and sheet. Skin prep: Chlorhexidine; local anesthetic administered A antimicrobial bonded/coated triple lumen catheter was placed in the right internal jugular vein using the Seldinger technique. Ultrasound guidance used.Yes.   Catheter placed to 16 cm. Blood aspirated via all 3 ports and then flushed x 3. Line sutured x 2 and dressing applied.     Right I J Vein     Evaluation Blood flow good Complications: No apparent complications Patient did tolerate procedure well. Chest X-ray ordered to verify placement.  CXR: pending.  Richardson Landry Minor ACNP Maryanna Shape PCCM Pager [phone] till 1 pm If no answer page [phone] 09/01/2017, 11:46 AM

## 2017-09-10 NOTE — Transfer of Care (Signed)
Immediate Anesthesia Transfer of Care Note  Patient: Pam Fuller  Procedure(s) Performed: ENDOSCOPIC RETROGRADE CHOLANGIOPANCREATOGRAPHY (ERCP) (N/A )  Patient Location: PACU and ICU  Anesthesia Type:General  Level of Consciousness: unresponsive  Airway & Oxygen Therapy: Patient remains intubated per anesthesia plan  Post-op Assessment: Report given to RN and Post -op Vital signs reviewed and stable  Post vital signs: Reviewed and stable  Last Vitals:  Vitals Value Taken Time  BP    Temp    Pulse    Resp    SpO2      Last Pain:  Vitals:   08/20/2017 1238  TempSrc: Axillary  PainSc:       Patients Stated Pain Goal: 0 (01/74/94 4967)  Complications: No apparent anesthesia complications

## 2017-09-10 NOTE — Progress Notes (Signed)
MD Santos-Sanchez made aware of hypothermia (96.1 with bair hugger and CRRT return line fluid warmer) and absence of radial pulse on L arm.   No new orders. Will monitor  Pam Fuller

## 2017-09-10 NOTE — Progress Notes (Signed)
   Pt was scheduled for additional IR drain placement possibly today INR still 2.38  Will recheck in am  Rescheduled tentatively to 4/24  RN aware

## 2017-09-10 NOTE — Progress Notes (Signed)
PHARMACY - ADULT TOTAL PARENTERAL NUTRITION CONSULT NOTE   Pharmacy Consult:  TPN Indication: Intolerance to enteral nutrition  Patient Measurements: Height: 5\' 2"  (157.5 cm) Weight: 142 lb 13.7 oz (64.8 kg) IBW/kg (Calculated) : 50.1 TPN AdjBW (KG): 61.7 Body mass index is 26.13 kg/m.  Assessment:  36 YOF presented on 09/07/2017 with abdominal pain and vomiting, found to have cholecystitis and she underwent cholecystomy on 09/11/2017.  She was on a clear liquid diet before and after surgery, but did not tolerate.  Imaging showed bile leak with fluid collection around the liver, and patient aspirated prior to planned ERCP on.  She was started on TF on 09/05/17 but did not tolerate and retried on 09/06/17 without success.  Pharmacy consulted to initiate TPN for nutritional support.  Patient has moderate malnutrition and is at risk for refeeding syndrome.  GI: NG O/P 161mL, drain O/P 18mL Endo: hypoglycemic prior to TPN, cbgs now low 100s, on d10 @ 50 ml/hr Insulin requirements in the past 24 hours: N/A Lytes: Na 134, K, Mg, Phos, CoCa all wnl Renal: ESRD on MWF HD now on CRRT  Pulm: intubated - FiO2 40% Cards: HTN, s/p NSTEMI.  MAP 50-60s, on Levophed @ 10 AC: s/p heparin for LUE DVT   hgb down to 7.1, plts 106, receiving pRBC + FFP Hepatobil: ERCP when stable. Pt in shock liver, LFTs, Tbili, INR all elevated Neuro: sedated GCS 10, RASS -1, CPOT 0 ID: s/p Zosyn for PNA/bile leak - afebrile, WBC up to 49 TPN Access: CCM working on access TPN start date: 09/08/17  Nutritional Goals (per RD rec on 09/05/17): 1350 kCal and 80-95gm protein per day  Current Nutrition:  NPO   Plan:   After access established, start concentrated TPN at 20 ml/hr (goal ~50 ml/hr) TPN will provide 36g AA and 115gm CHO, which equals to 536 kCal, meeting ~ Hold ILE for the first 7 days of TPN per ASPEN/SCCM guidelines, today is day #1 Electrolytes in TPN: given low TPN rate with risk of refeeding, standard lytes  except no calcium, Cl:Ac 1:1 Daily multivitamin and trace elements in TPN Continue sensitive SSI Q4H Reduce d10     Pam Fuller 08/27/2017 11:17 AM

## 2017-09-11 ENCOUNTER — Inpatient Hospital Stay (HOSPITAL_COMMUNITY): Payer: Medicare Other

## 2017-09-11 ENCOUNTER — Encounter (HOSPITAL_COMMUNITY): Payer: Self-pay | Admitting: Gastroenterology

## 2017-09-11 DIAGNOSIS — Z9889 Other specified postprocedural states: Secondary | ICD-10-CM

## 2017-09-11 HISTORY — PX: IR IMAGE GUIDED DRAINAGE PERCUT CATH  PERITONEAL RETROPERIT: IMG5467

## 2017-09-11 LAB — PREPARE FRESH FROZEN PLASMA
UNIT DIVISION: 0
UNIT DIVISION: 0
UNIT DIVISION: 0
UNIT DIVISION: 0
Unit division: 0
Unit division: 0

## 2017-09-11 LAB — TYPE AND SCREEN
ABO/RH(D): O NEG
ANTIBODY SCREEN: NEGATIVE
UNIT DIVISION: 0
Unit division: 0
Unit division: 0

## 2017-09-11 LAB — BPAM FFP
BLOOD PRODUCT EXPIRATION DATE: 201904282359
BLOOD PRODUCT EXPIRATION DATE: 201904282359
Blood Product Expiration Date: 201904272359
Blood Product Expiration Date: 201904282359
Blood Product Expiration Date: 201904282359
Blood Product Expiration Date: 201904282359
ISSUE DATE / TIME: 201904230633
ISSUE DATE / TIME: 201904230633
ISSUE DATE / TIME: 201904231154
ISSUE DATE / TIME: 201904231154
ISSUE DATE / TIME: 201904232031
ISSUE DATE / TIME: 201904232031
UNIT TYPE AND RH: 9500
UNIT TYPE AND RH: 5100
Unit Type and Rh: 9500
Unit Type and Rh: 5100
Unit Type and Rh: 9500
Unit Type and Rh: 5100

## 2017-09-11 LAB — BPAM RBC
BLOOD PRODUCT EXPIRATION DATE: 201905072359
BLOOD PRODUCT EXPIRATION DATE: 201905082359
BLOOD PRODUCT EXPIRATION DATE: 201905242359
ISSUE DATE / TIME: 201904230833
ISSUE DATE / TIME: 201904202215
ISSUE DATE / TIME: 201904211047
UNIT TYPE AND RH: 9500
Unit Type and Rh: 9500
Unit Type and Rh: 9500

## 2017-09-11 LAB — RENAL FUNCTION PANEL
Albumin: 2.3 g/dL — ABNORMAL LOW (ref 3.5–5.0)
Anion gap: 9 (ref 5–15)
BUN: 8 mg/dL (ref 6–20)
CALCIUM: 8 mg/dL — AB (ref 8.9–10.3)
CO2: 26 mmol/L (ref 22–32)
Chloride: 100 mmol/L — ABNORMAL LOW (ref 101–111)
Creatinine, Ser: 0.85 mg/dL (ref 0.44–1.00)
GFR calc non Af Amer: >60 mL/min (ref >60)
Glucose, Bld: 163 mg/dL — ABNORMAL HIGH (ref 65–99)
Phosphorus: 2.2 mg/dL — ABNORMAL LOW (ref 2.5–4.6)
Potassium: 4.1 mmol/L (ref 3.5–5.1)
Sodium: 135 mmol/L (ref 135–145)

## 2017-09-11 LAB — GLUCOSE, CAPILLARY
GLUCOSE-CAPILLARY: 141 mg/dL — AB (ref 65–99)
GLUCOSE-CAPILLARY: 151 mg/dL — AB (ref 65–99)
GLUCOSE-CAPILLARY: 201 mg/dL — AB (ref 65–99)
Glucose-Capillary: 168 mg/dL — ABNORMAL HIGH (ref 65–99)
Glucose-Capillary: 105 mg/dL — ABNORMAL HIGH (ref 65–99)
Glucose-Capillary: 131 mg/dL — ABNORMAL HIGH (ref 65–99)
Glucose-Capillary: 126 mg/dL — ABNORMAL HIGH (ref 65–99)

## 2017-09-11 LAB — CBC
HCT: 22.9 % — ABNORMAL LOW (ref 36.0–46.0)
HEMATOCRIT: 28.5 % — AB (ref 36.0–46.0)
HEMOGLOBIN: 7.5 g/dL — AB (ref 12.0–15.0)
HEMOGLOBIN: 9.3 g/dL — AB (ref 12.0–15.0)
MCH: 28.8 pg (ref 26.0–34.0)
MCH: 29.1 pg (ref 26.0–34.0)
MCHC: 32.8 g/dL (ref 30.0–36.0)
MCHC: 32.6 g/dL (ref 30.0–36.0)
MCV: 88.1 fL (ref 78.0–100.0)
MCV: 89.1 fL (ref 78.0–100.0)
PLATELETS: 74 10*3/uL — AB (ref 150–400)
Platelets: 57 10*3/uL — ABNORMAL LOW (ref 150–400)
RBC: 2.6 MIL/uL — AB (ref 3.87–5.11)
RBC: 3.2 MIL/uL — ABNORMAL LOW (ref 3.87–5.11)
RDW: 17 % — ABNORMAL HIGH (ref 11.5–15.5)
RDW: 16.1 % — ABNORMAL HIGH (ref 11.5–15.5)
WBC: 18.6 10*3/uL — ABNORMAL HIGH (ref 4.0–10.5)
WBC: 15 10*3/uL — ABNORMAL HIGH (ref 4.0–10.5)

## 2017-09-11 LAB — COMPREHENSIVE METABOLIC PANEL
ALT: 509 U/L — AB (ref 14–54)
AST: 1527 U/L — AB (ref 15–41)
Albumin: 2.5 g/dL — ABNORMAL LOW (ref 3.5–5.0)
Alkaline Phosphatase: 461 U/L — ABNORMAL HIGH (ref 38–126)
Anion gap: 11 (ref 5–15)
BILIRUBIN TOTAL: 9.8 mg/dL — AB (ref 0.3–1.2)
BUN: 8 mg/dL (ref 6–20)
CHLORIDE: 98 mmol/L — AB (ref 101–111)
CO2: 24 mmol/L (ref 22–32)
Calcium: 8.2 mg/dL — ABNORMAL LOW (ref 8.9–10.3)
Creatinine, Ser: 0.9 mg/dL (ref 0.44–1.00)
GFR calc Af Amer: >60 mL/min (ref >60)
GFR, EST NON AFRICAN AMERICAN: 58 mL/min — AB (ref >60)
GLUCOSE: 162 mg/dL — AB (ref 65–99)
POTASSIUM: 3.4 mmol/L — AB (ref 3.5–5.1)
Sodium: 133 mmol/L — ABNORMAL LOW (ref 135–145)
Total Protein: 6.1 g/dL — ABNORMAL LOW (ref 6.5–8.1)

## 2017-09-11 LAB — PROTIME-INR
INR: 1.8
INR: 1.77
PROTHROMBIN TIME: 20.4 s — AB (ref 11.4–15.2)
Prothrombin Time: 20.8 seconds — ABNORMAL HIGH (ref 11.4–15.2)

## 2017-09-11 LAB — PREPARE RBC (CROSSMATCH)

## 2017-09-11 LAB — PHOSPHORUS
PHOSPHORUS: 1.6 mg/dL — AB (ref 2.5–4.6)
PHOSPHORUS: 2.2 mg/dL — AB (ref 2.5–4.6)

## 2017-09-11 LAB — MAGNESIUM
MAGNESIUM: 2.3 mg/dL (ref 1.7–2.4)
Magnesium: 2.2 mg/dL (ref 1.7–2.4)

## 2017-09-11 LAB — LACTIC ACID, PLASMA: LACTIC ACID, VENOUS: 3 mmol/L — AB (ref 0.5–1.9)

## 2017-09-11 MED ORDER — VITAL HIGH PROTEIN PO LIQD
1000.0000 mL | ORAL | Status: DC
  Administered 2017-09-11: 1000 mL

## 2017-09-11 MED ORDER — SODIUM CHLORIDE 0.9 % IV SOLN
Freq: Once | INTRAVENOUS | Status: AC
  Administered 2017-09-11: 11:00:00 via INTRAVENOUS

## 2017-09-11 MED ORDER — STERILE WATER FOR INJECTION IV SOLN
INTRAVENOUS | Status: AC
  Administered 2017-09-11: 17:00:00 via INTRAVENOUS
  Filled 2017-09-11: qty 368

## 2017-09-11 MED ORDER — IOPAMIDOL (ISOVUE-300) INJECTION 61%
INTRAVENOUS | Status: AC
  Filled 2017-09-11: qty 50

## 2017-09-11 MED ORDER — SODIUM CHLORIDE 0.9 % IV SOLN: Freq: Once | INTRAVENOUS | Status: DC

## 2017-09-11 MED ORDER — POTASSIUM CHLORIDE 10 MEQ/100ML IV SOLN: 10.0000 meq | INTRAVENOUS | Status: DC

## 2017-09-11 MED ORDER — SODIUM CHLORIDE 0.9% FLUSH
5.0000 mL | Freq: Three times a day (TID) | INTRAVENOUS | Status: DC
  Administered 2017-09-11 (×2): 5 mL
  Administered 2017-09-12: 10 mL
  Administered 2017-09-12: 5 mL
  Administered 2017-09-13 – 2017-09-15 (×5): 10 mL
  Administered 2017-09-15 – 2017-09-17 (×6): 5 mL

## 2017-09-11 MED ORDER — VASOPRESSIN 20 UNIT/ML IV SOLN
0.0300 [IU]/min | INTRAVENOUS | Status: DC
  Administered 2017-09-11 – 2017-09-16 (×4): 0.03 [IU]/min via INTRAVENOUS
  Administered 2017-09-17: 0.01 [IU]/min via INTRAVENOUS
  Administered 2017-09-23: 0.03 [IU]/min via INTRAVENOUS
  Filled 2017-09-11 (×5): qty 2

## 2017-09-11 MED ORDER — POTASSIUM CHLORIDE 10 MEQ/50ML IV SOLN: 10.0000 meq | INTRAVENOUS | Status: DC

## 2017-09-11 MED ORDER — LIDOCAINE HCL 1 % IJ SOLN
INTRAMUSCULAR | Status: AC
  Filled 2017-09-11: qty 20

## 2017-09-11 MED ORDER — PRO-STAT SUGAR FREE PO LIQD
30.0000 mL | Freq: Two times a day (BID) | ORAL | Status: DC
  Administered 2017-09-11 – 2017-09-12 (×2): 30 mL
  Filled 2017-09-11 (×3): qty 30

## 2017-09-11 MED ORDER — STERILE WATER FOR INJECTION IV SOLN
INTRAVENOUS | Status: DC
  Filled 2017-09-11: qty 265.44

## 2017-09-11 MED ORDER — DEXTROSE 5 % IV SOLN
15.0000 mmol | Freq: Once | INTRAVENOUS | Status: AC
  Administered 2017-09-11: 15 mmol via INTRAVENOUS
  Filled 2017-09-11 (×2): qty 5

## 2017-09-11 MED ORDER — MIDAZOLAM HCL 2 MG/2ML IJ SOLN
INTRAMUSCULAR | Status: AC
  Filled 2017-09-11: qty 2

## 2017-09-11 NOTE — Progress Notes (Signed)
  Pharmacy Antibiotic Note  MINERVIA OSSO is a 84 y.o. female admitted on 08/23/2017 with sepsis.  Pharmacy has been consulted for Zosyn dosing. CRRT restarted 4/21.   Assessment:  Patient underwent ERCP 4/23, WBC trending down 27.1 >> 18.6, Temp 96.7, blood and bile cultures with no growth to date, lactic acid improving 5.5 > 3.0. Patient underwent ERCP 4/23, IR placing another drain today, follow for determination of source control and LOT.   Plan: Zosyn 3.375 grams IV Q6H  Monitor cultures, renal plans, LOT   Height: 5\' 2"  (157.5 cm) Weight: 144 lb 6.4 oz (65.5 kg) IBW/kg (Calculated) : 50.1  Temp (24hrs), Avg:96.9 F (36.1 C), Min:94.5 F (34.7 C), Max:98.1 F (36.7 C)  Recent Labs  Lab 09/08/17 1552  09/08/17 1936  09/09/17 0400 09/09/17 2056 08/30/2017 0435 08/28/2017 1551 08/27/2017 1906 09/11/17 0450 09/11/17 0451  WBC  --   --  24.9*  --  16.4*  --  49.1* 27.1*  --  18.6*  --   CREATININE 4.07*   < >  --    < > 2.21* 1.43* 1.30* 1.20*  --  0.90  --   LATICACIDVEN 10.8*  --   --   --   --   --   --   --  5.5*  --  3.0*   < > = values in this interval not displayed.    Estimated Creatinine Clearance: 42.8 mL/min (by C-G formula based on SCr of 0.9 mg/dL).    Allergies  Allergen Reactions  . Erythromycin Nausea And Vomiting  . Vicodin [Hydrocodone-Acetaminophen]     unknown    Antimicrobials this admission: 4/11 Unasyn x1 4/4 Zosyn >> 4/7 4/12 Zosyn >> 4/19 4/21 Zosyn >>    Dose adjustments this admission: 4/13: CRRT initiated > 3.375g IV q6h over 30 min 4/18: CRRT discontinued  4/19: Restarted IHD > 3.375g IV q12h 4/21: CRRT initiated > 3.375g IV q6h over 30 min  Microbiology results: 4/15 Bile Cx: NGTD 4/11 MRSA: Neg 4/11 Trach aspirate: No growth 4/11 Bile Cx: Neg  Thank you for allowing pharmacy to be a part of this patient's care.  Levonne Lapping, PharmD Candidate 09/11/2017 11:54 AM

## 2017-09-11 NOTE — Sedation Documentation (Signed)
Patient is resting comfortably. 

## 2017-09-11 NOTE — Progress Notes (Addendum)
Nutrition Follow-up  DOCUMENTATION CODES:   Non-severe (moderate) malnutrition in context of acute illness/injury  INTERVENTION:   TPN per pharmacy  Currently: Continue TPN at 20 ml/hr, goal 50 ml/hr -TPN will provide 36g AA and 115gm CHO, which equals to 536 kCal, meeting ~40% of patient needs  Recommend 100g lipids weekly to prevent essential fatty acid deficiency   Recommend B-Complex with C added daily to TPN- If this is unavailable recommend 500mg  vitamin C daily to TPN  Recommend daily MVI and trace elements in TPN  Recommend initiate trickle tube feeds of Vital HP @10ml /hr  Pt at high refeed risk; monitor magnesium, potassium, and phosphorus daily  NUTRITION DIAGNOSIS:   Moderate Malnutrition related to acute illness(cholecystitis, bile leak) as evidenced by energy intake < 75% for > 7 days, mild muscle depletion.  Ongoing   GOAL:   Patient will meet greater than or equal to 90% of their needs -progressing with TPN  MONITOR:   Other (Comment), Vent status, Labs, Skin, I & O's(TPN tolerance)  ASSESSMENT:   84 year old ESRD on HD,underwent laparoscopic cholecystectomy on 4/6 after being admitted on 4/3 for acute cholecystitis, developed a biliary leak subsequently demonstrated an biliary scan 4/9. Plan was for ERCP 4/11 but during induction , aspirated, was intubated and transferred to ICU. Drain placed by IR .  Course further complicated by left upper extremity DVT and non-STEMI requiring heparin.  She developed shock on 4/20 with bleeding from abdominal drain. Pt wit L AVF   Pt continues to be ventilated. OGT in place. On CRRT. Pt initiated on TPN 4/23; signs of refeeding today. TPN being managed by pharmacy. Recommend initiation of lipids as pt without adequate nutrition for >20 days and likely at high risk for essential fatty acid deficiency. Pt will need at least 100g/week to prevent deficiencies. Pt also at elevated risk for vitamin B and C deficiencies r/t  CRRT. Recommend add B- complex with C to TPN daily. Spoke with MD, ok to start trickle feeds once cleared by GI; recommend vital HP @10ml /hr. Per chart, pt with weight gain since admit; noted +9.6L on I & Os. RD is unsure if any significant wt loss. RD will adjust pt's estimated needs as pt without nutrition for several weeks.   Significant events: - 4/6 cholecystectomy  - 4/9 HIDA scan noted bile leak - 4/11 pt aspirated during ERCP attempt- procedure not completed, IR biliary drain placed, pt intubated  - 4/12 LIJ cath placed - 4/13- CRRT initiated -4/18 d/c CRRT - 4/19 TFs initiated at trickle - 4/20 d/c tube feeds -4/21 2 units prbcs, CRRT initiated for volume control, CT scan- new fluid collections around liver and spleen. - 4/23 RIJ cath placed, ERCP with stent placement, TPN initiated  - 4/24 IR drain placement   Medications reviewed and include: darbepoetin, hectorol, insulin, protonix, fentanyl, levophed, vitamin K, KCl, KPhos, vasopressin   Labs reviewed: Na 133(L), K 3.4(L), Cl 98(L), Ca 8.2(L) adj. 9.4 wnl, P 1.6(L), Mg 2.3 wnl, AlkPhos 461(H), alb 2.5(L), AST 1527(H), ALT 509(H), tbili 9.8(H) Triglycerides 105- 4/22 Wbc- 18.6(H), Hgb 7.5(L), Hct 22.9(L) cbgs- 100, 153, 162 x 24 hrs  Patient is currently intubated on ventilator support MV: 8.2 L/min Temp (24hrs), Avg:96.9 F (36.1 C), Min:94.5 F (34.7 C), Max:98.1 F (36.7 C)  Propofol: none  MAP- >54mmHg  Diet Order:  Diet NPO time specified TPN ADULT (ION) TPN ADULT (ION)  EDUCATION NEEDS:   No education needs have been identified at this time  Skin:  Skin Assessment: Skin Integrity Issues: Skin Integrity Issues:: Incisions Incisions: abdomen, breast  Last BM:  4/23- type 6  Height:   Ht Readings from Last 1 Encounters:  09/02/17 5\' 2"  (1.575 m)   Weight:   Wt Readings from Last 1 Encounters:  09/11/17 144 lb 6.4 oz (65.5 kg)   Ideal Body Weight:  50 kg  BMI:  Body mass index is 26.41  kg/m.  Estimated Nutritional Needs:   Kcal:  1250-1500kcal/day (25-30kcal/kg IBW)  Protein:  110-123g/day (1.8-2.0g/day admit wt)  Fluid:  >1.3L/day   Koleen Distance MS, RD, LDN Pager #[phone] After Hours Pager: [phone]'

## 2017-09-11 NOTE — Progress Notes (Signed)
[facility] Progress Note  1 Day Post-Op  Subjective: CC: sepsis Underwernt successful ERCP/stent yesterday for cystic stump leak Hemodynamically unstable, requiring levophed- dose now up to 22 Patient remains on the ventilator. CT 4/21 showed growth of perihepatic and perisplenic fluid collections - IR consulted, tentatively planning drain today pending coags. Patient had no further bloody BMs last 24h  Minimal output from RUQ drain.  Objective: Vital signs in last 24 hours: Temp:  [94.5 F (34.7 C)-97.6 F (36.4 C)] 97.6 F (36.4 C) (04/24 0436) Pulse Rate:  [63-98] 63 (04/24 0323) Resp:  [18-43] 20 (04/24 0545) BP: (62-163)/(27-99) 106/41 (04/24 0545) SpO2:  [91 %-100 %] 100 % (04/24 0545) FiO2 (%):  [30 %] 30 % (04/24 0323) Weight:  [65.5 kg (144 lb 6.4 oz)] 65.5 kg (144 lb 6.4 oz) (04/24 0500) Last BM Date: 09/15/2017  Intake/Output from previous day: 04/23 0701 - 04/24 0700 In: 4803.3 [I.V.:2300.6; Blood:1742.7; NG/GT:60; IV Piggyback:700] Out: 9211  Intake/Output this shift: Total I/O In: 2274.4 [I.V.:960.4; HERDE:081; IV Piggyback:550] Out: 2121 [Other:2121]  PE: Gen: on the vent, somnolent Card:  Regular rate and irregular rhythm Pulm:  Slightly tachypneic, inspiratory crackles in bases bilaterally  Abd: Soft, distended, incisions C/D/I, drain in RUQ with minimal bloody/bilious output Skin: warm and dry, no rashes    Lab Results:  Recent Labs    09/09/2017 1551 09/11/17 0450  WBC 27.1* 18.6*  HGB 8.1* 7.5*  HCT 24.3* 22.9*  PLT 88* 74*   BMET Recent Labs    08/26/2017 1551 09/11/17 0450  NA 134* 133*  K 3.7 3.4*  CL 99* 98*  CO2 21* 24  GLUCOSE 153* 162*  BUN 9 8  CREATININE 1.20* 0.90  CALCIUM 8.4* 8.2*   PT/INR Recent Labs    08/27/2017 2300 09/11/17 0450  LABPROT 20.8* 20.4*  INR 1.80 1.77   CMP     Component Value Date/Time   NA 133 (L) 09/11/2017 0450   NA 142 02/12/2017 1400   K 3.4 (L) 09/11/2017 0450   K 3.9  02/12/2017 1400   CL 98 (L) 09/11/2017 0450   CO2 24 09/11/2017 0450   CO2 30 (H) 02/12/2017 1400   GLUCOSE 162 (H) 09/11/2017 0450   GLUCOSE 88 02/12/2017 1400   BUN 8 09/11/2017 0450   BUN 16.9 02/12/2017 1400   CREATININE 0.90 09/11/2017 0450   CREATININE 6.2 (HH) 02/12/2017 1400   CALCIUM 8.2 (L) 09/11/2017 0450   CALCIUM 9.2 02/12/2017 1400   PROT 6.1 (L) 09/11/2017 0450   PROT 6.8 02/12/2017 1400   ALBUMIN 2.5 (L) 09/11/2017 0450   ALBUMIN 3.3 (L) 02/12/2017 1400   AST 1,527 (H) 09/11/2017 0450   AST 13 02/12/2017 1400   ALT 509 (H) 09/11/2017 0450   ALT 7 02/12/2017 1400   ALKPHOS 461 (H) 09/11/2017 0450   ALKPHOS 75 02/12/2017 1400   BILITOT 9.8 (H) 09/11/2017 0450   BILITOT 0.68 02/12/2017 1400   GFRNONAA 58 (L) 09/11/2017 0450   GFRAA >60 09/11/2017 0450   Lipase     Component Value Date/Time   LIPASE 36 09/15/2017 1618       Studies/Results: Dg Chest Port 1 View  Result Date: 08/28/2017 CLINICAL DATA:  Status post central line placement EXAM: PORTABLE CHEST 1 VIEW COMPARISON:  Portable chest x-ray of today's date at 5:08 a.m. FINDINGS: The patient has undergone placement of a right internal jugular venous catheter. The tip projects over the junction of the middle and distal thirds of  the SVC. The lung volumes remain low. There is no pneumothorax or pleural effusion. The interstitial markings are coarse throughout the right lung and at the left lung base. The endotracheal tube tip projects 3.6 cm above the carina. The esophagogastric tube tip and proximal port project below the inferior margin of the image. IMPRESSION: No postprocedure complication following right internal jugular venous catheter placement. The other support tubes are in reasonable position. Electronically Signed   By: David  Martinique M.D.   On: 09/14/2017 12:10   Dg Chest Port 1 View  Result Date: 08/31/2017 CLINICAL DATA:  Endotracheal tube EXAM: PORTABLE CHEST 1 VIEW COMPARISON:  09/08/2017  FINDINGS: Endotracheal tube and NG tube are unchanged. Low lung volumes, bibasilar atelectasis and vascular congestion. Heart is normal size. No visible effusions. IMPRESSION: Low lung volumes with vascular congestion and bibasilar atelectasis. Electronically Signed   By: Rolm Baptise M.D.   On: 08/26/2017 07:48   Dg Ercp  Result Date: 08/19/2017 CLINICAL DATA:  84 year old female undergoing ERCP for biliary leak EXAM: ERCP TECHNIQUE: Multiple spot images obtained with the fluoroscopic device and submitted for interpretation post-procedure. FLUOROSCOPY TIME:  Fluoroscopy Time:  0 minutes 45 seconds COMPARISON:  Most recent CT scan of the abdomen and pelvis 09/08/2017 FINDINGS: A total of 3 intraoperative images in a cine clip are submitted for interpretation. The images demonstrate a flexible endoscope in the descending duodenum with deep wire cannulation of the hepatic ducts. Cholangiogram demonstrates a mildly dilated common bile duct with subtle filling defects in the distal common duct. There is extravasation of injected contrast material from the cystic duct remnant. The subsequent images demonstrate placement of plastic biliary stent. A percutaneous drainage catheter is present in the right upper quadrant. IMPRESSION: 1. Biliary leak emanating from the cystic duct. 2. Filling defects within the distal common duct may represent stones and/or sludge. 3. Placement of a plastic biliary stent. These images were submitted for radiologic interpretation only. Please see the procedural report for the amount of contrast and the fluoroscopy time utilized. Electronically Signed   By: Jacqulynn Cadet M.D.   On: 09/15/2017 14:58    Anti-infectives: Anti-infectives (From admission, onward)   Start     Dose/Rate Route Frequency Ordered Stop   09/08/17 2000  piperacillin-tazobactam (ZOSYN) IVPB 3.375 g     3.375 g 100 mL/hr over 30 Minutes Intravenous Every 6 hours 09/08/17 1946     09/05/17 2000   piperacillin-tazobactam (ZOSYN) IVPB 3.375 g     3.375 g 12.5 mL/hr over 240 Minutes Intravenous Every 12 hours 09/05/17 1126 09/07/17 0154   08/31/17 0800  piperacillin-tazobactam (ZOSYN) IVPB 3.375 g  Status:  Discontinued     3.375 g 100 mL/hr over 30 Minutes Intravenous Every 6 hours 08/31/17 0739 09/05/17 1126   08/30/17 2000  Ampicillin-Sulbactam (UNASYN) 3 g in sodium chloride 0.9 % 100 mL IVPB  Status:  Discontinued     3 g 200 mL/hr over 30 Minutes Intravenous Every 24 hours 09/15/2017 0953 08/30/17 0945   08/30/17 1230  piperacillin-tazobactam (ZOSYN) IVPB 3.375 g  Status:  Discontinued     3.375 g 12.5 mL/hr over 240 Minutes Intravenous Every 12 hours 08/30/17 1134 08/31/17 0738   09/04/2017 1030  Ampicillin-Sulbactam (UNASYN) 3 g in sodium chloride 0.9 % 100 mL IVPB     3 g 200 mL/hr over 30 Minutes Intravenous  Once 08/31/2017 0953 09/08/2017 1246   08/31/2017 0000  ampicillin-sulbactam (UNASYN) 1.5 g in sodium chloride 0.9 % 100 mL IVPB  Status:  Discontinued     1.5 g 200 mL/hr over 30 Minutes Intravenous Once 08/28/17 1045 08/31/2017 0928   09/05/2017 2200  piperacillin-tazobactam (ZOSYN) IVPB 3.375 g    Note to Pharmacy:  Zosyn 3.375 g IV q12h in ESRD on HD   3.375 g 12.5 mL/hr over 240 Minutes Intravenous Every 12 hours 09/16/2017 1155 08/25/17 1335   08/22/17 1000  piperacillin-tazobactam (ZOSYN) IVPB 3.375 g  Status:  Discontinued    Note to Pharmacy:  Zosyn 3.375 g IV q12h in ESRD on HD   3.375 g 12.5 mL/hr over 240 Minutes Intravenous Every 12 hours 08/22/17 0128 09/01/2017 1155   08/22/17 0045  piperacillin-tazobactam (ZOSYN) IVPB 3.375 g     3.375 g 12.5 mL/hr over 240 Minutes Intravenous  Once 08/22/17 0038 08/22/17 0145       Assessment/Plan HTN ESRD - HD MWF H/o breast cancer  Acute cholecystitis POD15s/p lap chole4/6/19 by Dr. Redmond Pulling - CT scan 4/8 w/ small amount fluid around liver and layering in pelvis ; HIDA 4/9 positive for bile leak - CT 4/21 with multiple  fluid collections - IR to evaluate today for possible drain placement -ERCP 4/23 w stent placement (Dr Fuller Plan) Shock liver -Total bilirubin uptrending  VDRF secondary to aspiration/PNA -CCMfollowing - appreciate assistance.   Leukocytosis Likely secondary to aspiration PNA and bile leak  On Zosyn. WBC down to 18.6 today  ABL Anemia - - receiving FFP to correct coagulopathy,. inr 1.77 today  Lactic acidosis - on bicarb drip and CVVH, appreciate CCM assistance  ID -zosyn 4/4>>4/7,Unasyn 4/11-->4/12, Zosyn 4/12 --> FEN -NPO/OGT- TF held yesterday for emesis  VTE -SCDs Foley -none, anuric Follow up -Dr. Karleen Dolphin to reevaluate this week. Continue current drain and abx. Monitor Hgb    LOS: 20 days   Clovis Riley, MD

## 2017-09-11 NOTE — Procedures (Signed)
10 Fr epigastric biloma drain Pilar Plate bile EBL 0 Comp 0

## 2017-09-11 NOTE — Progress Notes (Signed)
Assessment/Plan: 1 Acute cholecystitis - s/p lap chole 4/6. Bile duct leak sp IR biliary drain 09/02/2017 for IR 4/24 intervention with drain for enlarging intraabdominal collection and ERCP biliary stent 4/23 2 Acute resp failure/ asp PNA -VDRF 3 Septic shock - 4 ESRD -Back on CRRT due to hypotension and TNA, keep even; cont current CRRT RX, K and phos replacement per pharmacy 5 Anemia of CKD-Hgb low. S/P prbc's 6Volume - keeping even on CRRT 7LUE DVT - acute L IJ DVT, Cath dc'd  Subjective: Interval History: Procedures noted  Objective: Vital signs in last 24 hours: Temp:  [94.5 F (34.7 C)-98.1 F (36.7 C)] 96.7 F (35.9 C) (04/24 1114) Pulse Rate:  [63-98] 69 (04/24 0940) Resp:  [18-33] 20 (04/24 1130) BP: (62-163)/(27-80) 141/49 (04/24 1130) SpO2:  [91 %-100 %] 100 % (04/24 1130) FiO2 (%):  [30 %] 30 % (04/24 1110) Weight:  [65.5 kg (144 lb 6.4 oz)] 65.5 kg (144 lb 6.4 oz) (04/24 0500) Weight change: 0.7 kg (1 lb 8.7 oz)  Intake/Output from previous day: 04/23 0701 - 04/24 0700 In: 4924.2 [I.V.:2371.5; Blood:1742.7; NG/GT:60; IV Piggyback:750] Out: 3668  Intake/Output this shift: Total I/O In: 746.5 [I.V.:298.5; Blood:315; Other:10; NG/GT:30; IV Piggyback:93] Out: 97 [Drains:550; Other:74]  General appearance: appears stated age and critically ill  Lab Results: Recent Labs    08/23/2017 1551 09/11/17 0450  WBC 27.1* 18.6*  HGB 8.1* 7.5*  HCT 24.3* 22.9*  PLT 88* 74*   BMET:  Recent Labs    08/31/2017 1551 09/11/17 0450  NA 134* 133*  K 3.7 3.4*  CL 99* 98*  CO2 21* 24  GLUCOSE 153* 162*  BUN 9 8  CREATININE 1.20* 0.90  CALCIUM 8.4* 8.2*   No results for input(s): PTH in the last 72 hours. Iron Studies: No results for input(s): IRON, TIBC, TRANSFERRIN, FERRITIN in the last 72 hours. Studies/Results: Dg Chest Port 1 View  Result Date: 09/11/2017 CLINICAL DATA:  Endotracheal tube.  Respiratory failure EXAM: PORTABLE CHEST 1 VIEW COMPARISON:   08/28/2017 FINDINGS: Endotracheal tube in good position. NG tube in the stomach. Right jugular central venous catheter tip cavoatrial junction unchanged. No pneumothorax Improved aeration of the lung. Slightly improved lung volume with improvement in diffuse bilateral airspace disease. No pleural effusion. IMPRESSION: Improved lung volume and improvement in diffuse bilateral airspace disease. Electronically Signed   By: Franchot Gallo M.D.   On: 09/11/2017 11:05   Dg Chest Port 1 View  Result Date: 08/27/2017 CLINICAL DATA:  Status post central line placement EXAM: PORTABLE CHEST 1 VIEW COMPARISON:  Portable chest x-ray of today's date at 5:08 a.m. FINDINGS: The patient has undergone placement of a right internal jugular venous catheter. The tip projects over the junction of the middle and distal thirds of the SVC. The lung volumes remain low. There is no pneumothorax or pleural effusion. The interstitial markings are coarse throughout the right lung and at the left lung base. The endotracheal tube tip projects 3.6 cm above the carina. The esophagogastric tube tip and proximal port project below the inferior margin of the image. IMPRESSION: No postprocedure complication following right internal jugular venous catheter placement. The other support tubes are in reasonable position. Electronically Signed   By: David  Martinique M.D.   On: 08/26/2017 12:10   Dg Chest Port 1 View  Result Date: 09/04/2017 CLINICAL DATA:  Endotracheal tube EXAM: PORTABLE CHEST 1 VIEW COMPARISON:  09/08/2017 FINDINGS: Endotracheal tube and NG tube are unchanged. Low lung volumes, bibasilar atelectasis  and vascular congestion. Heart is normal size. No visible effusions. IMPRESSION: Low lung volumes with vascular congestion and bibasilar atelectasis. Electronically Signed   By: Rolm Baptise M.D.   On: 09/05/2017 07:48   Dg Ercp  Result Date: 09/02/2017 CLINICAL DATA:  84 year old female undergoing ERCP for biliary leak EXAM: ERCP  TECHNIQUE: Multiple spot images obtained with the fluoroscopic device and submitted for interpretation post-procedure. FLUOROSCOPY TIME:  Fluoroscopy Time:  0 minutes 45 seconds COMPARISON:  Most recent CT scan of the abdomen and pelvis 09/08/2017 FINDINGS: A total of 3 intraoperative images in a cine clip are submitted for interpretation. The images demonstrate a flexible endoscope in the descending duodenum with deep wire cannulation of the hepatic ducts. Cholangiogram demonstrates a mildly dilated common bile duct with subtle filling defects in the distal common duct. There is extravasation of injected contrast material from the cystic duct remnant. The subsequent images demonstrate placement of plastic biliary stent. A percutaneous drainage catheter is present in the right upper quadrant. IMPRESSION: 1. Biliary leak emanating from the cystic duct. 2. Filling defects within the distal common duct may represent stones and/or sludge. 3. Placement of a plastic biliary stent. These images were submitted for radiologic interpretation only. Please see the procedural report for the amount of contrast and the fluoroscopy time utilized. Electronically Signed   By: Jacqulynn Cadet M.D.   On: 08/20/2017 14:58    Scheduled: . chlorhexidine gluconate (MEDLINE KIT)  15 mL Mouth Rinse BID  . Chlorhexidine Gluconate Cloth  6 each Topical Daily  . darbepoetin (ARANESP) injection - DIALYSIS  150 mcg Intravenous Q Mon-HD  . doxercalciferol  2 mcg Intravenous Q M,W,F-HD  . insulin aspart  0-9 Units Subcutaneous Q4H  . iopamidol      . lidocaine      . mouth rinse  15 mL Mouth Rinse 10 times per day  . pantoprazole  40 mg Intravenous Q12H  . sodium chloride flush  10-40 mL Intracatheter Q12H  . sodium chloride flush  5 mL Intracatheter Q8H   Continuous: . sodium chloride Stopped (09/08/17 1700)  . sodium chloride    . sodium chloride    . sodium chloride    . sodium chloride    . fentaNYL infusion INTRAVENOUS  150 mcg/hr (09/11/17 1000)  . norepinephrine (LEVOPHED) Adult infusion 18 mcg/min (09/11/17 1100)  . phytonadione (VITAMIN K) IV 10 mg (09/11/17 1118)  . piperacillin-tazobactam Stopped (09/11/17 0656)  . potassium PHOSPHATE IVPB (mmol) 15 mmol (09/11/17 1046)  . dialysis replacement fluid (prismasate) 400 mL/hr at 09/11/17 0000  . dialysis replacement fluid (prismasate) 200 mL/hr at 09/11/17 0050  . dialysate (PRISMASATE) 1,600 mL/hr at 09/11/17 0753  . sodium chloride    . TPN ADULT (ION) 21 mL/hr at 09/17/2017 1717  . TPN ADULT (ION)    . vasopressin (PITRESSIN) infusion - *FOR SHOCK* 0.03 Units/min (09/11/17 1036)      LOS: 20 days   Estanislado Emms 09/11/2017,11:49 AM

## 2017-09-11 NOTE — Sedation Documentation (Signed)
Patient stable during procedure. No medication given.

## 2017-09-11 NOTE — Progress Notes (Signed)
PULMONARY / CRITICAL CARE MEDICINE   Name: Pam Fuller MRN: [id number] DOB: [birth year]    ADMISSION DATE:  08/26/2017 CONSULTATION DATE:  4/11  REFERRING MD:  Dr. Maudie Mercury  CHIEF COMPLAINT:  Aspiration  BRIEF SUMMARY:   Pam Fuller is a 84 year old female with ESRD on MWF HD, HTN, Anemia, and hx of breast cancer who presented to the ED on 4/4 with RLQ abdominal pain. She was found to have acute cholecystitis with a gallstone lodged in the gallbladder neck. CBD measured up to 10 mm. She underwent laparoscopic cholecystectomy on 4/6. She initially did well but on 4/8 was noted to have worsened pain and confusion and thought to have post-op ileus. CT abd/pelvis 4/8 showed small-mod fluid in the abdominal pelvis, read as normal post op changes but could not exclude a bile leak. A hepatobiliary scan was obtained on 4/9 which did suggest a bile leak. Surgery recommended IR drain and GI consult for ERCP. She went to the Endoscopy unit on 4/11 AM for ERCP however during prep with anesthesia she had significant bilious emesis with hypotension requiring intubation and pressor support. ERCP was not attempted. She was subsequently transferred to the medical ICU.   STUDIES:  CT abd/pelv 4/4 >> acute cholecystitis, gallstone in GB neck, CBD 10 mm  CT Head: no acute changes, chronic small vessel ischemic changes  CT abd/pelvis 4/8 >> small-mod fluid in abd/pelvis  Hepatobiliary scan 4/9 >> bile leak  TTE 4/12 >> EF 65-70%, G1DD, narrow LVOT, mild TR, mild pHTN  U/S Abdomen 4/14 >> small fluid near cholecystectomy site  Hepatobiliary scan 4/14 >> Rt subhepatic fluid collection consistent w/ biloma; increased in size from prior  CT abd/pelvis w/ oral contrast 4/16 >> drainage catheter at inferior liver margin with decreased perihepatic fluid; low-density fluid at left subdiaphragmatic area, questionable colonic wall thickening throughout, multifocal airspace disease of lung bases  CT  abdomen.pelvis 4/21: interval growth of large perihepatic and perisplenic fluid collection with multiple, new intraperitoneal fluid collections. Resolving multifocal pneumonia.    CULTURES: Tracheal aspirate 4/11 >> no growth Abd fluid 4/11 >> no growth 5 days Abd fluid 4/15 >> no growth 3 days  ANTIBIOTICS: Zosyn 4/3 >> 4/7 Unasyn 4/11 Zosyn 4/12 - 4/19 Zosyn 4/21-  LINES/TUBES: ETT 4/11 OGT 4/11 PIV RUE 4/9 RLQ Biliary Drain 4/11 HD Cath LIJ 4/12 >> 4/19 L femoral trialysis catheter 4/21> RIJ cath 4/23>   SIGNIFICANT EVENTS: 4/4 - admitted w/ acute cholecystitis 4/6 - lap chole 4/9 - bile leak seen on hepatobiliary scan 4/11 - Aspirated during prep for ERCP, intubated and transferred to ICU 4/11 - Rt abd fluid drain placed by IR 4/12- Heparin gtt started for NSTEMI; held after OG bleeding and trop peak 4/13 - worsening liver enzyymes. ESRD - Anuric. CRRT started. On levophed 30mcg and fent 112mcg 4/14 - Hgb 6.0, transfused 2 units PRBCs 4/17 - DVT LUE - started bivalirudin for suspected HIT 4/18 - CVVH d/c'ed, return to intermittent HD. HIT 4/19 - Restarted HD  4/20 - Bleeding from GB drain, OGT, and stools. Received 1u pRBC. Heparin gtt held. 4/21 - Multiple, new fluid collections found on CT abd/pelvis   4/23 - ERCP with cystic duct bile leak s/p stent placement, erosive gastritis, and CBD dilation   SUBJECTIVE/OVERNIGHT/INTERVAL HX -  Still hypothermic. Hypotensive overnight requiring up titration of levophed. On Fentanyl gtt and unarousable to voice.    VITAL SIGNS: BP (!) 106/41   Pulse 63   Temp (!)  96.2 F (35.7 C) (Axillary)   Resp 20   Ht 5\' 2"  (1.575 m)   Wt 144 lb 6.4 oz (65.5 kg)   SpO2 100%   BMI 26.41 kg/m   HEMODYNAMICS:    VENTILATOR SETTINGS: Vent Mode: PRVC FiO2 (%):  [30 %] 30 % Set Rate:  [20 bmp-27 bmp] 20 bmp Vt Set:  [300 mL-400 mL] 400 mL PEEP:  [5 cmH20] 5 cmH20 Plateau Pressure:  [21 cmH20-25 cmH20] 25 cmH20  INTAKE /  OUTPUT: I/O last 3 completed shifts: In: 6571.3 [I.V.:3428.6; Blood:2232.7; NG/GT:60; IV Piggyback:850] Out: 1017 [Drains:25; PZWCH:8527]  PHYSICAL EXAMINATION:  General: patient is sedated and unresponsive to verbal stimuli  HEENT: NCAT, ET tube in place, RIJ cath in place with dressing c/d/i Cardiac:  RRR, no mrg  Pulm: tachypneic, decreased breath sounds at bases, no wheezing  Abd: pain on abdomen palpation, soft, non-distended, decreased bowel sounds  Ext: LUE edema L>>R unchanged from yesterday, L AVF with palpable thrill, femoral trialysis cathter in place with dressing c/d/i  Neuro:  Pupils equal and symmetric, does not arouse to voice but grimaces to pain, moves all 4 extremities spontaneously but no purposefully, does not follow commands     PULMONARY Recent Labs  Lab 09/08/17 0945 09/09/17 0952 09/17/2017 1840  PHART 7.253* 7.330* 7.335*  PCO2ART 25.9* 28.4* 42.6  PO2ART 166* 118.0* 95.0  HCO3 11.1* 15.1* 22.1  TCO2  --  16*  --   O2SAT 98.6 99.0 97.5    CBC Recent Labs  Lab 09/16/2017 0435 08/19/2017 1551 09/11/17 0450  HGB 7.1* 8.1* 7.5*  HCT 21.1* 24.3* 22.9*  WBC 49.1* 27.1* 18.6*  PLT 106* 88* 74*    COAGULATION Recent Labs  Lab 08/20/2017 0435 09/02/2017 1006 08/23/2017 1551 08/19/2017 2300 09/11/17 0450  INR 2.38 2.27 2.16 1.80 1.77    CARDIAC   Recent Labs  Lab 09/09/17 1011  TROPONINI 0.49*   No results for input(s): PROBNP in the last 168 hours.   CHEMISTRY Recent Labs  Lab 09/07/17 0720  09/08/17 0655  09/09/17 0400 09/09/17 2056 09/04/2017 0435 08/22/2017 1551 09/11/17 0450  NA  --    < > 137   < > 135 136 134* 134* 133*  K  --    < > 4.5   < > 3.7 4.3 4.2 3.7 3.4*  CL  --    < > 96*   < > 96* 98* 99* 99* 98*  CO2  --    < > 12*   < > 22 19* 20* 21* 24  GLUCOSE  --    < > 35*   < > 101* 113* 100* 153* 162*  BUN  --    < > 29*   < > 17 9 7 9 8   CREATININE  --    < > 4.03*   < > 2.21* 1.43* 1.30* 1.20* 0.90  CALCIUM  --    < > 9.2   < >  7.8* 8.4* 8.4* 8.4* 8.2*  MG 2.5*  --  2.8*  --  2.4  --  2.3  --  2.3  PHOS 4.1  --   --   --  3.5 3.2  --  3.1 1.6*   < > = values in this interval not displayed.   Estimated Creatinine Clearance: 42.8 mL/min (by C-G formula based on SCr of 0.9 mg/dL).  I/O last 3 completed shifts: In: 6571.3 [I.V.:3428.6; Blood:2232.7; NG/GT:60; IV Piggyback:850] Out: 7824 [Drains:25; MPNTI:1443]  Intake/Output Summary (Last 24 hours) at 09/11/2017 0747 Last data filed at 09/11/2017 0700 Gross per 24 hour  Intake 4689.19 ml  Output 3668 ml  Net 1021.19 ml     LIVER Recent Labs  Lab 09/06/17 0515  09/08/17 1021 09/09/17 0400  09/09/17 2056 08/22/2017 0435 09/04/2017 1006 09/14/2017 1551 09/13/2017 2300 09/11/17 0450  AST 68*  --  851* 2,141*  --   --  3,665*  --   --   --  1,527*  ALT 112*  --  257* 613*  --   --  819*  --   --   --  509*  ALKPHOS 458*  --  350* 484*  --   --  515*  --   --   --  461*  BILITOT 3.5*  --  4.0* 5.3*  --   --  8.6*  --   --   --  9.8*  PROT 5.5*  --  4.4* 5.1*  --   --  5.7*  --   --   --  6.1*  ALBUMIN 1.5*   < > 1.3* 1.7*  --  2.3* 2.3*  --  2.4*  --  2.5*  INR  --   --   --   --    < >  --  2.38 2.27 2.16 1.80 1.77   < > = values in this interval not displayed.     INFECTIOUS Recent Labs  Lab 09/08/17 1552 08/28/2017 1906 09/11/17 0451  LATICACIDVEN 10.8* 5.5* 3.0*     ENDOCRINE CBG (last 3)  Recent Labs    09/15/2017 2026 09/06/2017 2336 09/11/17 0436  GLUCAP 190* 201* 168*     IMAGING x48h  - image(s) personally visualized  -   highlighted in bold Dg Chest Port 1 View  Result Date: 09/05/2017 CLINICAL DATA:  Status post central line placement EXAM: PORTABLE CHEST 1 VIEW COMPARISON:  Portable chest x-ray of today's date at 5:08 a.m. FINDINGS: The patient has undergone placement of a right internal jugular venous catheter. The tip projects over the junction of the middle and distal thirds of the SVC. The lung volumes remain low. There is no  pneumothorax or pleural effusion. The interstitial markings are coarse throughout the right lung and at the left lung base. The endotracheal tube tip projects 3.6 cm above the carina. The esophagogastric tube tip and proximal port project below the inferior margin of the image. IMPRESSION: No postprocedure complication following right internal jugular venous catheter placement. The other support tubes are in reasonable position. Electronically Signed   By: David  Martinique M.D.   On: 09/08/2017 12:10   Dg Chest Port 1 View  Result Date: 09/17/2017 CLINICAL DATA:  Endotracheal tube EXAM: PORTABLE CHEST 1 VIEW COMPARISON:  09/08/2017 FINDINGS: Endotracheal tube and NG tube are unchanged. Low lung volumes, bibasilar atelectasis and vascular congestion. Heart is normal size. No visible effusions. IMPRESSION: Low lung volumes with vascular congestion and bibasilar atelectasis. Electronically Signed   By: Rolm Baptise M.D.   On: 09/03/2017 07:48   Dg Ercp  Result Date: 08/22/2017 CLINICAL DATA:  84 year old female undergoing ERCP for biliary leak EXAM: ERCP TECHNIQUE: Multiple spot images obtained with the fluoroscopic device and submitted for interpretation post-procedure. FLUOROSCOPY TIME:  Fluoroscopy Time:  0 minutes 45 seconds COMPARISON:  Most recent CT scan of the abdomen and pelvis 09/08/2017 FINDINGS: A total of 3 intraoperative images in a cine clip are submitted for interpretation. The images demonstrate a flexible  endoscope in the descending duodenum with deep wire cannulation of the hepatic ducts. Cholangiogram demonstrates a mildly dilated common bile duct with subtle filling defects in the distal common duct. There is extravasation of injected contrast material from the cystic duct remnant. The subsequent images demonstrate placement of plastic biliary stent. A percutaneous drainage catheter is present in the right upper quadrant. IMPRESSION: 1. Biliary leak emanating from the cystic duct. 2. Filling  defects within the distal common duct may represent stones and/or sludge. 3. Placement of a plastic biliary stent. These images were submitted for radiologic interpretation only. Please see the procedural report for the amount of contrast and the fluoroscopy time utilized. Electronically Signed   By: Jacqulynn Cadet M.D.   On: 09/14/2017 14:58     DISCUSSION: Pam Fuller is a 84 year old female with ESRD on MWF HD, HTN, Anemia, and hx of breast cancer who was admitted 4/4 with acute cholecystitis. S/p lap chole 4/6 complicated by post-op bile leak. Went for ERCP 4/11 however developed bilious emesis during prep, hypotension, and required intubation plus pressor support. Transferred to the ICU on 4/11.  ASSESSMENT / PLAN: PULMONARY A: Acute Respiratory Distress 2/2 Aspiration Requiring Mechanical Ventilation since 4/11 P:   - Continue vent support for now  - CT abd/pelvis 4/21  with improvement in pneumonia, but new intraperitoneal fluid collections  - VAP protocol   CARDIOVASCULAR A:  #baseline Aortic Atherosclerosis on CXR RBBB - present prior to admission #current Septic Shock 2/2 intra-peritoneal abscesses/biloma  Troponin elevation (peak 4.47) 2/2 demand ischemia in setting of hypotensive event - resolved  Echocardiogram w/ EF 65-70% Prolonged QTc LUE DVT - heparin started 4/19 and stopped 4/21 P:  - Levophed restarted 4/21. Requiring increasing doses > 20. Will add vasopressin.  - MAP goal > 65 - Heparin stopped in the setting of acute blood loss 4/21   RENAL A:   ESRD on MWF HD AVF LUE P:   - CRRT restarted on 4/21  - Nephrology following  - Follow up output and monitor electrolytes  - Replace electrolytes as indicated  GASTROINTESTINAL A:   Acute Cholecystitis s/p lap chole 4/6 Bile leak/biliary peritonitis s/p IR Rt abdominal fluid drain 4/11 - biloma by HIDA 4/14 Shock Liver Multiple new intraperitoneal abscesses/biloma found on  4/21 Nutrition Erosive gastritis seen on ERCP 4/23 SUP P:   - s/p ERCP 4/23 with stent placement that will need removal in 8 weeks   - INR continues to improve, 1.7 this AM. Will continue to monitor in the setting of shock liver  - Goal INR for IR is <1.5.  - Tentative plan for drain flush and perc drain by IR today  - Zosyn 4/121>  - Right abdominal fluid drain in place with minimal drainage, might be clotted. May go to IR today for flushing  - Appreciate surgery and GI  assistance - Continue to monitor transaminases, improving  - TPN started 4/23 - Continue Protonix as ordered   HEMATOLOGIC A:   Hx of Breast Cancer - no acute issue Anemia of Chronic Disease - requiring multiple transfusions  Leukocytosis - markedly worse WBC 49 this AM  Thrombocytopenia - Plt 74 LUE (IJV) DVT Acute blood loss anemia s/p 2 units PRBCs 4/21 P:  - Transfusing 1 unit this AM, Hgb goal > 7.0 - Heparin stopped 4/20 - CBC daily  - Bivalirudin stopped 4/20, heparin initiated after HIT antibody was negative - Left IJ temporary HD catheter removed 4/19 - L  femoral trialysis catheter placed on 4/21 - RIJ catheter placed on 4/22  INFECTIOUS A:   Septic Shock from Aspiration PNA - resolved  Bile Leak s/p IR Biliary Drain 4/11 Septic shock 2/2 new intraperitoneal abscesses/biloma Leukocytosis WBC 49 4/23 - likely leukemoid reaction, WBC 18 today  P:   - Zosyn 4/12 >> 4/19 - Restarted Zosyn 4/21 - S/p ERCP with stent placement  - Tentative plan for IR today for possible perc drain placement  -  Repeating blood cx, pending   ENDOCRINE A:   Secondary Hyperparathyroidism Hypoglycemia - likely 2/2 shock liver. Improving  P:   - TPN started 4/23 - D/c D10  - CBG monitoring q4h   NEUROLOGIC A:   Pain/Agitation Anisocoria  P:   - Fentanyl gtt  - RASS goal: 0 to -1 - Fentanyl and Versed as needed   FAMILY  - Updates: Sons updated at bedside 4/22.    Welford Roche, MD   Internal Medicine PGY-1  P [phone]

## 2017-09-11 NOTE — Progress Notes (Signed)
Daily Rounding Note  09/11/2017, 1:14 PM  LOS: 20 days   SUBJECTIVE:    Remains on pressors.  Required another unit of blood for blood loss anemia today.  A second drain was placed in to deal with biloma, this put out 600 ml of bloody >> dark bilious fluid.   Hypothermic, warming blanket in place. Remains on vasopressin, Levophed.  TNA in place.     OBJECTIVE:         Vital signs in last 24 hours:    Temp:  [94.5 F (34.7 C)-98.1 F (36.7 C)] 96.1 F (35.6 C) (04/24 1220) Pulse Rate:  [63-98] 69 (04/24 0940) Resp:  [18-29] 20 (04/24 1300) BP: (62-176)/(27-80) 95/47 (04/24 1300) SpO2:  [93 %-100 %] 100 % (04/24 1300) FiO2 (%):  [30 %] 30 % (04/24 1200) Weight:  [144 lb 6.4 oz (65.5 kg)] 144 lb 6.4 oz (65.5 kg) (04/24 0500) Last BM Date: 09/09/2017 Filed Weights   09/09/17 0300 08/23/2017 0407 09/11/17 0500  Weight: 142 lb 13.7 oz (64.8 kg) 142 lb 13.7 oz (64.8 kg) 144 lb 6.4 oz (65.5 kg)   General: intubated, covered in warming blanket.  Ill, frail   Heart: RRR Chest: on vent, breathing not labored Abdomen: soft, NT.  Bile drains x 2, one with blood (newest), the other with dark bile.  BS quiet  Extremities: thin.  UE edema, L >> R.   Neuro/Psych:  Sedated, did not respond or awaken to exam, no vigorous attempt made to awaken pt.  Did not move during the exam.     Intake/Output from previous day: 04/23 0701 - 04/24 0700 In: 4924.2 [I.V.:2371.5; Blood:1742.7; NG/GT:60; IV Piggyback:750] Out: 3668   Intake/Output this shift: Total I/O In: 1153.1 [I.V.:448.1; Blood:315; Other:10; NG/GT:30; IV Piggyback:350] Out: 964 [Drains:675; Other:289]  Lab Results: Recent Labs    09/12/2017 0435 08/23/2017 1551 09/11/17 0450  WBC 49.1* 27.1* 18.6*  HGB 7.1* 8.1* 7.5*  HCT 21.1* 24.3* 22.9*  PLT 106* 88* 74*   BMET Recent Labs    09/08/2017 0435 09/06/2017 1551 09/11/17 0450  NA 134* 134* 133*  K 4.2 3.7 3.4*  CL 99*  99* 98*  CO2 20* 21* 24  GLUCOSE 100* 153* 162*  BUN _0 CREATININE 1.30* 1.20* 0.90  CALCIUM 8.4* 8.4* 8.2*   LFT Recent Labs    09/09/17 0400  09/11/2017 0435 08/23/2017 1551 09/11/17 0450  PROT 5.1*  --  5.7*  --  6.1*  ALBUMIN 1.7*   < > 2.3* 2.4* 2.5*  AST 2,141*  --  3,665*  --  1,527*  ALT 613*  --  819*  --  509*  ALKPHOS 484*  --  515*  --  461*  BILITOT 5.3*  --  8.6*  --  9.8*   < > = values in this interval not displayed.   PT/INR Recent Labs    09/09/2017 2300 09/11/17 0450  LABPROT 20.8* 20.4*  INR 1.80 1.77   Hepatitis Panel No results for input(s): HEPBSAG, HCVAB, HEPAIGM, HEPBIGM in the last 72 hours.  Studies/Results: Ir Lenise Arena W Catheter Placement  Result Date: 09/11/2017 INDICATION: Biloma.  Bile leak. EXAM: ABDOMINAL DRAIN PLACEMENT MEDICATIONS: The patient is currently admitted to the hospital and receiving intravenous antibiotics. The antibiotics were administered within an appropriate time frame prior to the initiation of the procedure. ANESTHESIA/SEDATION: Intubated COMPLICATIONS: None immediate. PROCEDURE: Informed written consent was obtained from the patient after  a thorough discussion of the procedural risks, benefits and alternatives. All questions were addressed. Maximal Sterile Barrier Technique was utilized including caps, mask, sterile gowns, sterile gloves, sterile drape, hand hygiene and skin antiseptic. A timeout was performed prior to the initiation of the procedure. The epigastric region was prepped and draped in a sterile fashion. 1% lidocaine was utilized for local anesthesia. Under sonographic guidance, a micropuncture needle was inserted into the epigastric biloma. This was removed over a 018 wire which was up sized to an Amplatz. Ten [place] dilator followed by a 10 [place] drain were inserted. It was looped and string fixed. Bilious fluid was aspirated. FINDINGS: Images document 68 French drain placement into an upper abdominal biloma.  IMPRESSION: Successful 10 French biloma drain placement. Electronically Signed   By: Marybelle Killings M.D.   On: 09/11/2017 12:59   Ir US Guide Bx Asp/drain  Result Date: 09/11/2017 INDICATION: Biloma.  Bile leak. EXAM: ABDOMINAL DRAIN PLACEMENT MEDICATIONS: The patient is currently admitted to the hospital and receiving intravenous antibiotics. The antibiotics were administered within an appropriate time frame prior to the initiation of the procedure. ANESTHESIA/SEDATION: Intubated COMPLICATIONS: None immediate. PROCEDURE: Informed written consent was obtained from the patient after a thorough discussion of the procedural risks, benefits and alternatives. All questions were addressed. Maximal Sterile Barrier Technique was utilized including caps, mask, sterile gowns, sterile gloves, sterile drape, hand hygiene and skin antiseptic. A timeout was performed prior to the initiation of the procedure. The epigastric region was prepped and draped in a sterile fashion. 1% lidocaine was utilized for local anesthesia. Under sonographic guidance, a micropuncture needle was inserted into the epigastric biloma. This was removed over a 018 wire which was up sized to an Amplatz. Ten [place] dilator followed by a 10 [place] drain were inserted. It was looped and string fixed. Bilious fluid was aspirated. FINDINGS: Images document 56 French drain placement into an upper abdominal biloma. IMPRESSION: Successful 10 French biloma drain placement. Electronically Signed   By: Marybelle Killings M.D.   On: 09/11/2017 12:59   Dg Chest Port 1 View  Result Date: 09/11/2017 CLINICAL DATA:  Endotracheal tube.  Respiratory failure EXAM: PORTABLE CHEST 1 VIEW COMPARISON:  08/19/2017 FINDINGS: Endotracheal tube in good position. NG tube in the stomach. Right jugular central venous catheter tip cavoatrial junction unchanged. No pneumothorax Improved aeration of the lung. Slightly improved lung volume with improvement in diffuse bilateral airspace  disease. No pleural effusion. IMPRESSION: Improved lung volume and improvement in diffuse bilateral airspace disease. Electronically Signed   By: Franchot Gallo M.D.   On: 09/11/2017 11:05   Dg Chest Port 1 View  Result Date: 09/04/2017 CLINICAL DATA:  Status post central line placement EXAM: PORTABLE CHEST 1 VIEW COMPARISON:  Portable chest x-ray of today's date at 5:08 a.m. FINDINGS: The patient has undergone placement of a right internal jugular venous catheter. The tip projects over the junction of the middle and distal thirds of the SVC. The lung volumes remain low. There is no pneumothorax or pleural effusion. The interstitial markings are coarse throughout the right lung and at the left lung base. The endotracheal tube tip projects 3.6 cm above the carina. The esophagogastric tube tip and proximal port project below the inferior margin of the image. IMPRESSION: No postprocedure complication following right internal jugular venous catheter placement. The other support tubes are in reasonable position. Electronically Signed   By: David  Martinique M.D.   On: 08/23/2017 12:10   Dg Chest [facility]  Result Date: 08/26/2017 CLINICAL DATA:  Endotracheal tube EXAM: PORTABLE CHEST 1 VIEW COMPARISON:  09/08/2017 FINDINGS: Endotracheal tube and NG tube are unchanged. Low lung volumes, bibasilar atelectasis and vascular congestion. Heart is normal size. No visible effusions. IMPRESSION: Low lung volumes with vascular congestion and bibasilar atelectasis. Electronically Signed   By: Rolm Baptise M.D.   On: 09/09/2017 07:48   Dg Ercp  Result Date: 09/17/2017 CLINICAL DATA:  84 year old female undergoing ERCP for biliary leak EXAM: ERCP TECHNIQUE: Multiple spot images obtained with the fluoroscopic device and submitted for interpretation post-procedure. FLUOROSCOPY TIME:  Fluoroscopy Time:  0 minutes 45 seconds COMPARISON:  Most recent CT scan of the abdomen and pelvis 09/08/2017 FINDINGS: A total of 3  intraoperative images in a cine clip are submitted for interpretation. The images demonstrate a flexible endoscope in the descending duodenum with deep wire cannulation of the hepatic ducts. Cholangiogram demonstrates a mildly dilated common bile duct with subtle filling defects in the distal common duct. There is extravasation of injected contrast material from the cystic duct remnant. The subsequent images demonstrate placement of plastic biliary stent. A percutaneous drainage catheter is present in the right upper quadrant. IMPRESSION: 1. Biliary leak emanating from the cystic duct. 2. Filling defects within the distal common duct may represent stones and/or sludge. 3. Placement of a plastic biliary stent. These images were submitted for radiologic interpretation only. Please see the procedural report for the amount of contrast and the fluoroscopy time utilized. Electronically Signed   By: Jacqulynn Cadet M.D.   On: 08/22/2017 14:58   Scheduled Meds: . chlorhexidine gluconate (MEDLINE KIT)  15 mL Mouth Rinse BID  . Chlorhexidine Gluconate Cloth  6 each Topical Daily  . darbepoetin (ARANESP) injection - DIALYSIS  150 mcg Intravenous Q Mon-HD  . doxercalciferol  2 mcg Intravenous Q M,W,F-HD  . insulin aspart  0-9 Units Subcutaneous Q4H  . iopamidol      . lidocaine      . mouth rinse  15 mL Mouth Rinse 10 times per day  . pantoprazole  40 mg Intravenous Q12H  . sodium chloride flush  10-40 mL Intracatheter Q12H  . sodium chloride flush  5 mL Intracatheter Q8H   Continuous Infusions: . sodium chloride Stopped (09/08/17 1700)  . sodium chloride    . sodium chloride    . sodium chloride    . sodium chloride    . fentaNYL infusion INTRAVENOUS 150 mcg/hr (09/11/17 1200)  . norepinephrine (LEVOPHED) Adult infusion 16 mcg/min (09/11/17 1200)  . piperacillin-tazobactam Stopped (09/11/17 1313)  . potassium chloride    . potassium PHOSPHATE IVPB (mmol) 15 mmol (09/11/17 1046)  . dialysis  replacement fluid (prismasate) 400 mL/hr at 09/11/17 0000  . dialysis replacement fluid (prismasate) 200 mL/hr at 09/11/17 0050  . dialysate (PRISMASATE) 1,600 mL/hr at 09/11/17 1314  . sodium chloride    . TPN ADULT (ION) 21 mL/hr at 09/07/2017 1717  . TPN ADULT (ION)    . vasopressin (PITRESSIN) infusion - *FOR SHOCK* 0.03 Units/min (09/11/17 1200)   PRN Meds:.albuterol, alteplase, dextrose, docusate, fentaNYL (SUBLIMAZE) injection, heparin, midazolam, sodium chloride   ASSESMENT:   *  Lap chole 09/12/2017  *   Postop bile leak at cystic duct. 09/17/2017 biloma drain placed.   09/15/2017 ERCP: bile leak at the cystic duct.  Erosive gastritis and mucosal lesions consistent with trauma from gastric tube suctioning.  Cholangiogram suspicious for sludge in the CBD.  CBD mildly dilated.  Plastic stent placed into CBD.  09/11/17 second biloma drain placed.  Is putting out more bloody than bilious looking material.  *  Gastric trauma from NGT.  On BID IV Protonix, ? Could we drop this to 1 x daily.    *   Blood loss anemia.  Got additional 1 U PRBCs today (# 6 since admission).  Visible blood seen in the biliary drain placed today.  *   UE DVT.  Was on Heparin, d/c'd due to bleeding.  Additionally acquired supratherapeutic INR, now subtherapeutic after reversal (14 U FFP).  .   *   Shock liver.  Improved but persists.  T bili improvement expected to lag behind improved Transaminases in pt with ESRD.     *   Aspiration PNA, sepsis.  Remains on vent and pressors.      *   ESRD.  Currently on CVVH.    PLAN   *   Ok to start low rate "trickle" to feeds.  *   In ~ 6 weeks will need CBD stent removed.  Defer mgt of biloma drains to IR and genl surgery.   *   On BID IV Protonix, ? Could we drop this to 1 x daily.   *   GI will sign off.   Call if ? Or reinvolvement needed.      Azucena Freed  09/11/2017, 1:14 PM Phone [phone]

## 2017-09-11 NOTE — Progress Notes (Signed)
Patient ID: Pam Fuller, female   DOB: 09-06-1932, 84 y.o.   MRN: [id number]  Risks and benefits discussed with the patient's son Mateo Flow via phone regarding biloma drain placement including bleeding, infection, damage to adjacent structures, bowel perforation/fistula connection, and sepsis.  All of the patient's sons questions were answered, he is agreeable to proceed. Consent signed and in chart.

## 2017-09-11 NOTE — Progress Notes (Signed)
[facility] Progress Note Patient Name: Pam Fuller DOB: 05-Sep-1932 MRN: [id number]   Date of Service  09/11/2017  HPI/Events of Note  INR 1.8, still above goal.   eICU Interventions  Repeat ffp        Laverle Hobby 09/11/2017, 12:39 AM

## 2017-09-11 NOTE — Progress Notes (Addendum)
PHARMACY - ADULT TOTAL PARENTERAL NUTRITION CONSULT NOTE   Pharmacy Consult:  TPN Indication: Intolerance to enteral nutrition  Patient Measurements: Height: 5\' 2"  (157.5 cm) Weight: 144 lb 6.4 oz (65.5 kg) IBW/kg (Calculated) : 50.1 TPN AdjBW (KG): 61.7 Body mass index is 26.41 kg/m.  Assessment:  48 YOF presented on 08/19/2017 with abdominal pain and vomiting, found to have cholecystitis and she underwent cholecystomy on 09/17/2017.  She was on a clear liquid diet before and after surgery, but did not tolerate.  Imaging showed bile leak with fluid collection around the liver, and patient aspirated prior to planned ERCP on.  She was started on TF on 09/05/17 but did not tolerate and retried on 09/06/17 without success.  Pharmacy consulted to initiate TPN for nutritional support.  Patient has moderate malnutrition and is at risk for refeeding syndrome.  GI: NG O/P 173mL, drain O/P 63mL Endo: hypoglycemic prior to TPN, cbgs now low 100-200s, on d10 has been stopped Insulin requirements in the past 24 hours: 8 units Lytes: Na 133, K 3.4, CoCa 9.4, Phos 1.6 (refeeding?) Renal: ESRD on MWF HD now on CRRT  Pulm: intubated - FiO2 40% Cards: HTN, s/p NSTEMI.  MAP 50-60s, on Levophed AC: s/p heparin for LUE DVT   hgb down to 7.5, plts 74, received pRBC + FFP Hepatobil: Pt in shock liver, LFTs, Tbili elevated, INR up slightly- receiving Vit K, s/p ERCP yesterday Neuro: sedated GCS 10, RASS -1, CPOT 0 ID: s/p Zosyn for PNA/bile leak - afebrile, WBC 49 > 18.6 TPN Access: R IJ placed 4/23 TPN start date: 09/08/17  Nutritional Goals (per RD rec on 09/05/17): 1350 kCal and 80-95gm protein per day  Current Nutrition:  NPO   Plan:   -Continue TPN at 20 ml/hr, goal close to 50 ml/hr -TPN will provide 36g AA and 115gm CHO, which equals to 536 kCal, meeting ~40% of patient needs -Hold ILE for the first 7 days of TPN per ASPEN/SCCM guidelines, today is day #2 -Electrolytes in TPN: Increase Phos, no  Calcium, others to standard, Cl:Ac 1:1 -Daily multivitamin in TPN, hold trace elements -Continue sensitive SSI Q4H -Kphos 15 mmol x1 -Discontinue d10    Harvel Quale 09/11/2017 9:29 AM   -TPN was changed to include lipids -Current TPN has: 55 g AA, 113 g CHO, 9.6 g lipids for a total of 700 kcal, meeting ~50% of patient needs -Vit C was added, we do not have Bcomplex available to be used with the TPN compounder. Bcomplex will need to be given separately, outside of TPN  Harvel Quale 09/11/2017. 2:40 PM

## 2017-09-12 ENCOUNTER — Encounter (HOSPITAL_COMMUNITY): Payer: Self-pay

## 2017-09-12 ENCOUNTER — Inpatient Hospital Stay (HOSPITAL_COMMUNITY): Payer: Medicare Other

## 2017-09-12 LAB — COMPREHENSIVE METABOLIC PANEL
ALT: 359 U/L — ABNORMAL HIGH (ref 14–54)
ANION GAP: 10 (ref 5–15)
AST: 723 U/L — ABNORMAL HIGH (ref 15–41)
Albumin: 2 g/dL — ABNORMAL LOW (ref 3.5–5.0)
Alkaline Phosphatase: 481 U/L — ABNORMAL HIGH (ref 38–126)
BUN: 11 mg/dL (ref 6–20)
CHLORIDE: 104 mmol/L (ref 101–111)
CO2: 22 mmol/L (ref 22–32)
Calcium: 8.3 mg/dL — ABNORMAL LOW (ref 8.9–10.3)
Creatinine, Ser: 1.02 mg/dL — ABNORMAL HIGH (ref 0.44–1.00)
GFR calc non Af Amer: 50 mL/min — ABNORMAL LOW (ref >60)
GFR, EST AFRICAN AMERICAN: 58 mL/min — AB (ref >60)
Glucose, Bld: 147 mg/dL — ABNORMAL HIGH (ref 65–99)
POTASSIUM: 4.1 mmol/L (ref 3.5–5.1)
Sodium: 136 mmol/L (ref 135–145)
Total Bilirubin: 12 mg/dL — ABNORMAL HIGH (ref 0.3–1.2)
Total Protein: 5.6 g/dL — ABNORMAL LOW (ref 6.5–8.1)

## 2017-09-12 LAB — GLUCOSE, CAPILLARY
GLUCOSE-CAPILLARY: 138 mg/dL — AB (ref 65–99)
GLUCOSE-CAPILLARY: 164 mg/dL — AB (ref 65–99)
GLUCOSE-CAPILLARY: 172 mg/dL — AB (ref 65–99)
Glucose-Capillary: 129 mg/dL — ABNORMAL HIGH (ref 65–99)
Glucose-Capillary: 148 mg/dL — ABNORMAL HIGH (ref 65–99)
Glucose-Capillary: 181 mg/dL — ABNORMAL HIGH (ref 65–99)

## 2017-09-12 LAB — RENAL FUNCTION PANEL
ALBUMIN: 2 g/dL — AB (ref 3.5–5.0)
Anion gap: 12 (ref 5–15)
BUN: 12 mg/dL (ref 6–20)
CALCIUM: 8.4 mg/dL — AB (ref 8.9–10.3)
CO2: 20 mmol/L — ABNORMAL LOW (ref 22–32)
Chloride: 102 mmol/L (ref 101–111)
Creatinine, Ser: 0.92 mg/dL (ref 0.44–1.00)
GFR calc Af Amer: >60 mL/min (ref >60)
GFR calc non Af Amer: 56 mL/min — ABNORMAL LOW (ref >60)
GLUCOSE: 170 mg/dL — AB (ref 65–99)
PHOSPHORUS: 2.9 mg/dL (ref 2.5–4.6)
Potassium: 4.9 mmol/L (ref 3.5–5.1)
SODIUM: 134 mmol/L — AB (ref 135–145)

## 2017-09-12 LAB — PROTIME-INR
INR: 2.27
PROTHROMBIN TIME: 24.8 s — AB (ref 11.4–15.2)

## 2017-09-12 LAB — BPAM FFP
BLOOD PRODUCT EXPIRATION DATE: 201904272359
Blood Product Expiration Date: 201904272359
ISSUE DATE / TIME: 201904240105
ISSUE DATE / TIME: 201904240151
UNIT TYPE AND RH: 6200
Unit Type and Rh: 6200

## 2017-09-12 LAB — CBC
HCT: 26.8 % — ABNORMAL LOW (ref 36.0–46.0)
Hemoglobin: 8.9 g/dL — ABNORMAL LOW (ref 12.0–15.0)
MCH: 29.7 pg (ref 26.0–34.0)
MCHC: 33.2 g/dL (ref 30.0–36.0)
MCV: 89.3 fL (ref 78.0–100.0)
PLATELETS: 49 10*3/uL — AB (ref 150–400)
RBC: 3 MIL/uL — ABNORMAL LOW (ref 3.87–5.11)
RDW: 17.1 % — ABNORMAL HIGH (ref 11.5–15.5)
WBC: 13.3 10*3/uL — ABNORMAL HIGH (ref 4.0–10.5)

## 2017-09-12 LAB — PREPARE FRESH FROZEN PLASMA
UNIT DIVISION: 0
Unit division: 0

## 2017-09-12 LAB — MAGNESIUM
Magnesium: 2.1 mg/dL (ref 1.7–2.4)
Magnesium: 2.3 mg/dL (ref 1.7–2.4)

## 2017-09-12 LAB — PHOSPHORUS: PHOSPHORUS: 2.7 mg/dL (ref 2.5–4.6)

## 2017-09-12 MED ORDER — PANTOPRAZOLE SODIUM 40 MG PO PACK
40.0000 mg | PACK | Freq: Every day | ORAL | Status: DC
Start: 2017-09-13 — End: 2017-09-17
  Administered 2017-09-13 – 2017-09-16 (×4): 40 mg
  Filled 2017-09-12 (×5): qty 20

## 2017-09-12 MED ORDER — VITAL HIGH PROTEIN PO LIQD: 1000.0000 mL | ORAL | Status: DC

## 2017-09-12 MED ORDER — HYDROCORTISONE NA SUCCINATE PF 100 MG IJ SOLR
50.0000 mg | Freq: Four times a day (QID) | INTRAMUSCULAR | Status: DC
  Administered 2017-09-12 – 2017-09-13 (×4): 50 mg via INTRAVENOUS
  Filled 2017-09-12 (×2): qty 1
  Filled 2017-09-12: qty 2
  Filled 2017-09-12 (×2): qty 1

## 2017-09-12 MED ORDER — STERILE WATER FOR INJECTION IV SOLN
INTRAVENOUS | Status: AC
  Administered 2017-09-12: 18:00:00 via INTRAVENOUS
  Filled 2017-09-12: qty 588.8

## 2017-09-12 NOTE — Progress Notes (Signed)
Referring Physician(s): Dr Franco Collet  Supervising Physician: Arne Cleveland  Patient Status:  [facility] - In-pt  Chief Complaint:   Acute cholecystitis POD16 s/p lap chole4/6/19 by Dr. Redmond Pulling - CT scan 4/8 w/ small amount fluid around liver and layering in pelvis ; HIDA 4/9 positive for bile leak - CT 4/21 with multiple fluid collections - additional drain placed 4/24 with good output -ERCP 4/23 w stent placement (Dr Fuller Plan) Shock liver    Subjective:  Biloma drain placed 4/11 2nd drain placed 4/24 Good output On vent; no response  Allergies: Erythromycin and Vicodin [hydrocodone-acetaminophen]  Medications: Prior to Admission medications   Medication Sig Start Date End Date Taking? Authorizing Provider  amLODipine (NORVASC) 10 MG tablet Take 10 mg by mouth every evening.  03/31/13  Yes [provider]  calcium acetate (PHOSLO) 667 MG capsule Take 2 capsules (1,334 mg total) by mouth 3 (three) times daily with meals. 09/15/15  Yes Dana Allan I, MD  cloNIDine (CATAPRES) 0.1 MG tablet Take 0.1 mg by mouth every evening.  03/24/13  Yes [provider]  fluticasone (FLONASE) 50 MCG/ACT nasal spray Place 2 sprays into both nostrils daily. 09/15/15  Yes Dana Allan I, MD  hydrALAZINE (APRESOLINE) 50 MG tablet Take 50 mg by mouth every evening.  04/14/13  Yes [provider]  multivitamin (RENA-VIT) TABS tablet Take 1 tablet by mouth at bedtime. 09/15/15  Yes Bonnell Public, MD  tamoxifen (NOLVADEX) 20 MG tablet Take 1 tablet (20 mg total) by mouth daily. Patient taking differently: Take 20 mg by mouth every evening.  02/12/17  Yes Causey, Charlestine Massed, NP  hydrOXYzine (ATARAX/VISTARIL) 25 MG tablet Take 1 tablet (25 mg total) by mouth every 8 (eight) hours as needed for itching. Patient not taking: Reported on 08/22/2017 09/15/15   Dana Allan I, MD     Vital Signs: BP (!) 120/52   Pulse 78   Temp (!) 97.4 F (36.3 C)  (Axillary)   Resp 20   Ht 5\' 2"  (1.575 m)   Wt 142 lb 3.2 oz (64.5 kg)   SpO2 100%   BMI 26.01 kg/m   Physical Exam  Abdominal: Bowel sounds are decreased.  Skin: Skin is warm and dry.  Sites are clean and dry No bleeding OP is bloody bile for both drains 825 cc recorded yesterday 70 cc so far today Both with approx 15 cc in drains  Vitals reviewed.   Imaging: Ct Abdomen Pelvis Wo Contrast  Result Date: 09/08/2017 CLINICAL DATA:  Elevated lactic acid. Bloody stools. Status post cholecystectomy August 24, 2017. History of breast cancer, end-stage renal disease on dialysis. EXAM: CT ABDOMEN AND PELVIS WITHOUT CONTRAST TECHNIQUE: Multidetector CT imaging of the abdomen and pelvis was performed following the standard protocol without IV contrast. COMPARISON:  CT abdomen and pelvis September 03, 2017. FINDINGS: LOWER CHEST: Multifocal lung base consolidation improved from prior examination. Heart size is normal. Moderate coronary artery calcifications. No pericardial effusion. HEPATOBILIARY: Nodular hepatic contour. Marked interval increase in size large perihepatic heterogeneous fluid collection largest component measuring 16.7 x 6.8 cm (24 Hounsfield units). Fluid collection is contiguous with gallbladder fossa, status post cholecystectomy. PANCREAS: Normal. SPLEEN: Large perisplenic fluid collection. ADRENALS/URINARY TRACT: Kidneys are orthotopic, extremely atrophic. Bilateral renal cysts measuring to 16 mm on the RIGHT. No nephrolithiasis, hydronephrosis; limited assessment for renal masses on this nonenhanced examination. The unopacified ureters are normal in course and caliber. Urinary bladder is partially distended and unremarkable. Normal adrenal glands. STOMACH/BOWEL:  Nasogastric tube tip terminates in mid stomach. The stomach, small and large bowel are normal in course and caliber without inflammatory changes, sensitivity decreased by lack of enteric contrast. Moderate colonic diverticulosis.  VASCULAR/LYMPHATIC: Aortoiliac vessels are normal in course and caliber. Mild calcific atherosclerosis. LEFT femoral central venous catheter placed. REPRODUCTIVE: Status post hysterectomy. 10 mm LEFT adnexal cyst, no routine indicated follow-up. OTHER: RIGHT upper quadrant pigtail drainage CT catheter posteriorly abutting and possibly within the hepatic fluid collection. Multiple heterogeneous intraperitoneal fluid collections along the anterior abdominal wall largest measuring 13.4 x 5.6 cm which appears to communicate with the hepatic collection. Small volume ascites within pelvis. MUSCULOSKELETAL: Non-acute. Osteopenia. Multiple old mild compression fractures and old Schmorl's nodes. Anterior abdominal wall scarring. IMPRESSION: 1. Marked interval growth of large perihepatic and perisplenic fluid collections with multiple new intraperitoneal fluid collections compatible with abscess, less likely hematoma. RIGHT pigtail drainage catheter abutting perihepatic fluid collection. 2. Status post cholecystectomy. 3. Resolving multifocal pneumonia. 4. These results will be called to the ordering clinician or representative by the Radiologist Assistant, and communication documented in the PACS or zVision Dashboard. Electronically Signed   By: Elon Alas M.D.   On: 09/08/2017 18:57   Dg Chest Port 1 View  Result Date: 09/12/2017 CLINICAL DATA:  Intubation. EXAM: PORTABLE CHEST 1 VIEW COMPARISON:  09/11/2017. FINDINGS: Endotracheal tube, NG tube, right IJ line noted stable position. Heart size normal. Bibasilar atelectasis/infiltrates. Small bilateral pleural effusion scratched it no pleural effusion or pneumothorax. Pigtail catheter is noted over the upper abdomen bilaterally. IMPRESSION: 1. Chest lines and tubes in stable position. Pigtail catheters noted in the upper abdomen abdomen. 2.  Bibasilar atelectasis and/or infiltrates. Electronically Signed   By: Marcello Moores  Register   On: 09/12/2017 09:41   Dg Chest  Port 1 View  Result Date: 09/11/2017 CLINICAL DATA:  Endotracheal tube.  Respiratory failure EXAM: PORTABLE CHEST 1 VIEW COMPARISON:  09/12/2017 FINDINGS: Endotracheal tube in good position. NG tube in the stomach. Right jugular central venous catheter tip cavoatrial junction unchanged. No pneumothorax Improved aeration of the lung. Slightly improved lung volume with improvement in diffuse bilateral airspace disease. No pleural effusion. IMPRESSION: Improved lung volume and improvement in diffuse bilateral airspace disease. Electronically Signed   By: Franchot Gallo M.D.   On: 09/11/2017 11:05   Dg Chest Port 1 View  Result Date: 08/23/2017 CLINICAL DATA:  Status post central line placement EXAM: PORTABLE CHEST 1 VIEW COMPARISON:  Portable chest x-ray of today's date at 5:08 a.m. FINDINGS: The patient has undergone placement of a right internal jugular venous catheter. The tip projects over the junction of the middle and distal thirds of the SVC. The lung volumes remain low. There is no pneumothorax or pleural effusion. The interstitial markings are coarse throughout the right lung and at the left lung base. The endotracheal tube tip projects 3.6 cm above the carina. The esophagogastric tube tip and proximal port project below the inferior margin of the image. IMPRESSION: No postprocedure complication following right internal jugular venous catheter placement. The other support tubes are in reasonable position. Electronically Signed   By: David  Martinique M.D.   On: 09/05/2017 12:10   Dg Chest Port 1 View  Result Date: 08/25/2017 CLINICAL DATA:  Endotracheal tube EXAM: PORTABLE CHEST 1 VIEW COMPARISON:  09/08/2017 FINDINGS: Endotracheal tube and NG tube are unchanged. Low lung volumes, bibasilar atelectasis and vascular congestion. Heart is normal size. No visible effusions. IMPRESSION: Low lung volumes with vascular congestion and bibasilar atelectasis. Electronically  Signed   By: Rolm Baptise M.D.   On:  09/05/2017 07:48   Dg Ercp  Result Date: 08/26/2017 CLINICAL DATA:  84 year old female undergoing ERCP for biliary leak EXAM: ERCP TECHNIQUE: Multiple spot images obtained with the fluoroscopic device and submitted for interpretation post-procedure. FLUOROSCOPY TIME:  Fluoroscopy Time:  0 minutes 45 seconds COMPARISON:  Most recent CT scan of the abdomen and pelvis 09/08/2017 FINDINGS: A total of 3 intraoperative images in a cine clip are submitted for interpretation. The images demonstrate a flexible endoscope in the descending duodenum with deep wire cannulation of the hepatic ducts. Cholangiogram demonstrates a mildly dilated common bile duct with subtle filling defects in the distal common duct. There is extravasation of injected contrast material from the cystic duct remnant. The subsequent images demonstrate placement of plastic biliary stent. A percutaneous drainage catheter is present in the right upper quadrant. IMPRESSION: 1. Biliary leak emanating from the cystic duct. 2. Filling defects within the distal common duct may represent stones and/or sludge. 3. Placement of a plastic biliary stent. These images were submitted for radiologic interpretation only. Please see the procedural report for the amount of contrast and the fluoroscopy time utilized. Electronically Signed   By: Jacqulynn Cadet M.D.   On: 08/28/2017 14:58   Ir Image Guided Drainage Percut Cath  Peritoneal Retroperit  Result Date: 09/11/2017 INDICATION: Biloma.  Bile leak. EXAM: ABDOMINAL DRAIN PLACEMENT MEDICATIONS: The patient is currently admitted to the hospital and receiving intravenous antibiotics. The antibiotics were administered within an appropriate time frame prior to the initiation of the procedure. ANESTHESIA/SEDATION: Intubated COMPLICATIONS: None immediate. PROCEDURE: Informed written consent was obtained from the patient after a thorough discussion of the procedural risks, benefits and alternatives. All questions  were addressed. Maximal Sterile Barrier Technique was utilized including caps, mask, sterile gowns, sterile gloves, sterile drape, hand hygiene and skin antiseptic. A timeout was performed prior to the initiation of the procedure. The epigastric region was prepped and draped in a sterile fashion. 1% lidocaine was utilized for local anesthesia. Under sonographic guidance, a micropuncture needle was inserted into the epigastric biloma. This was removed over a 018 wire which was up sized to an Amplatz. Ten [place] dilator followed by a 10 [place] drain were inserted. It was looped and string fixed. Bilious fluid was aspirated. FINDINGS: Images document 92 French drain placement into an upper abdominal biloma. IMPRESSION: Successful 10 French biloma drain placement. Electronically Signed   By: Marybelle Killings M.D.   On: 09/11/2017 12:59    Labs:  CBC: Recent Labs    08/31/2017 1551 09/11/17 0450 09/11/17 1515 09/12/17 0421  WBC 27.1* 18.6* 15.0* 13.3*  HGB 8.1* 7.5* 9.3* 8.9*  HCT 24.3* 22.9* 28.5* 26.8*  PLT 88* 74* 57* 49*    COAGS: Recent Labs    09/05/17 2004 09/06/17 0011 09/06/17 0515 09/09/17 0400  08/31/2017 1551 08/26/2017 2300 09/11/17 0450 09/12/17 0421  INR  --   --   --   --    < > 2.16 1.80 1.77 2.27  APTT 82* 82* 77* 53*  --   --   --   --   --    < > = values in this interval not displayed.    BMP: Recent Labs    08/28/2017 1551 09/11/17 0450 09/11/17 1515 09/12/17 0421  NA 134* 133* 135 136  K 3.7 3.4* 4.1 4.1  CL 99* 98* 100* 104  CO2 21* 24 26 22   GLUCOSE 153* 162* 163* 147*  BUN 9 8 8 11   CALCIUM 8.4* 8.2* 8.0* 8.3*  CREATININE 1.20* 0.90 0.85 1.02*  GFRNONAA 41* 58* >60 50*  GFRAA 47* >60 >60 58*    LIVER FUNCTION TESTS: Recent Labs    09/09/17 0400  08/25/2017 0435 09/17/2017 1551 09/11/17 0450 09/11/17 1515 09/12/17 0421  BILITOT 5.3*  --  8.6*  --  9.8*  --  12.0*  AST 2,141*  --  3,665*  --  1,527*  --  723*  ALT 613*  --  819*  --  509*  --  359*    ALKPHOS 484*  --  515*  --  461*  --  481*  PROT 5.1*  --  5.7*  --  6.1*  --  5.6*  ALBUMIN 1.7*   < > 2.3* 2.4* 2.5* 2.3* 2.0*   < > = values in this interval not displayed.    Assessment and Plan:  Biloma drains x 2 Will follow  Plan per [facility]  Electronically Signed: Gorgeous Newlun A, PA-C 09/12/2017, 1:35 PM   I spent a total of 15 Minutes at the the patient's bedside AND on the patient's hospital floor or unit, greater than 50% of which was counseling/coordinating care for biloma drains

## 2017-09-12 NOTE — Progress Notes (Signed)
Assessment/Plan: 1 Acute cholecystitis - s/p lap chole 4/6. Bile duct leak sp IR biliary drain 09/15/2017 for IR 4/24 intervention with drain for enlarging intraabdominal collection and ERCP biliary stent 4/23 2 Acute resp failure/ asp PNA -VDRF 3 Septic shock - 4 ESRD - on CRRT due to hypotension, on TNA, will keep even; cont current CRRT RX 5 Anemia of CKD-Hgb low. S/P prbc's 6Volume - keeping even on CRRT 7LUE DVT - acute L IJ DVT, Cath dc'd  Subjective: Interval History: arouses  Objective: Vital signs in last 24 hours: Temp:  [96.6 F (35.9 C)-98.4 F (36.9 C)] 97.4 F (36.3 C) (04/25 0800) Pulse Rate:  [78-86] 78 (04/25 1112) Resp:  [18-26] 23 (04/25 1200) BP: (92-133)/(42-100) 123/100 (04/25 1200) SpO2:  [83 %-100 %] 91 % (04/25 1200) FiO2 (%):  [30 %] 30 % (04/25 1112) Weight:  [64.5 kg (142 lb 3.2 oz)] 64.5 kg (142 lb 3.2 oz) (04/25 0427) Weight change: -1 kg (-2 lb 3.3 oz)  Intake/Output from previous day: 04/24 0701 - 04/25 0700 In: 2914.4 [I.V.:1935.1; Blood:315; NG/GT:154.3; IV IRWERXVQM:086] Out: 2789 [Drains:825] Intake/Output this shift: Total I/O In: 505.2 [I.V.:392.2; NG/GT:63; IV Piggyback:50] Out: 642 [Drains:70; Other:572]  General appearance: acutely ill  Moves some Blinks to visual threat Marked LUE swelling  Lab Results: Recent Labs    09/11/17 1515 09/12/17 0421  WBC 15.0* 13.3*  HGB 9.3* 8.9*  HCT 28.5* 26.8*  PLT 57* 49*   BMET:  Recent Labs    09/11/17 1515 09/12/17 0421  NA 135 136  K 4.1 4.1  CL 100* 104  CO2 26 22  GLUCOSE 163* 147*  BUN 8 11  CREATININE 0.85 1.02*  CALCIUM 8.0* 8.3*   No results for input(s): PTH in the last 72 hours. Iron Studies: No results for input(s): IRON, TIBC, TRANSFERRIN, FERRITIN in the last 72 hours. Studies/Results: Dg Chest Port 1 View  Result Date: 09/12/2017 CLINICAL DATA:  Intubation. EXAM: PORTABLE CHEST 1 VIEW COMPARISON:  09/11/2017. FINDINGS: Endotracheal tube, NG tube,  right IJ line noted stable position. Heart size normal. Bibasilar atelectasis/infiltrates. Small bilateral pleural effusion scratched it no pleural effusion or pneumothorax. Pigtail catheter is noted over the upper abdomen bilaterally. IMPRESSION: 1. Chest lines and tubes in stable position. Pigtail catheters noted in the upper abdomen abdomen. 2.  Bibasilar atelectasis and/or infiltrates. Electronically Signed   By: Marcello Moores  Register   On: 09/12/2017 09:41   Dg Chest Port 1 View  Result Date: 09/11/2017 CLINICAL DATA:  Endotracheal tube.  Respiratory failure EXAM: PORTABLE CHEST 1 VIEW COMPARISON:  08/22/2017 FINDINGS: Endotracheal tube in good position. NG tube in the stomach. Right jugular central venous catheter tip cavoatrial junction unchanged. No pneumothorax Improved aeration of the lung. Slightly improved lung volume with improvement in diffuse bilateral airspace disease. No pleural effusion. IMPRESSION: Improved lung volume and improvement in diffuse bilateral airspace disease. Electronically Signed   By: Franchot Gallo M.D.   On: 09/11/2017 11:05   Dg Ercp  Result Date: 09/05/2017 CLINICAL DATA:  84 year old female undergoing ERCP for biliary leak EXAM: ERCP TECHNIQUE: Multiple spot images obtained with the fluoroscopic device and submitted for interpretation post-procedure. FLUOROSCOPY TIME:  Fluoroscopy Time:  0 minutes 45 seconds COMPARISON:  Most recent CT scan of the abdomen and pelvis 09/08/2017 FINDINGS: A total of 3 intraoperative images in a cine clip are submitted for interpretation. The images demonstrate a flexible endoscope in the descending duodenum with deep wire cannulation of the hepatic ducts. Cholangiogram demonstrates a  mildly dilated common bile duct with subtle filling defects in the distal common duct. There is extravasation of injected contrast material from the cystic duct remnant. The subsequent images demonstrate placement of plastic biliary stent. A percutaneous drainage  catheter is present in the right upper quadrant. IMPRESSION: 1. Biliary leak emanating from the cystic duct. 2. Filling defects within the distal common duct may represent stones and/or sludge. 3. Placement of a plastic biliary stent. These images were submitted for radiologic interpretation only. Please see the procedural report for the amount of contrast and the fluoroscopy time utilized. Electronically Signed   By: Jacqulynn Cadet M.D.   On: 09/04/2017 14:58   Ir Image Guided Drainage Percut Cath  Peritoneal Retroperit  Result Date: 09/11/2017 INDICATION: Biloma.  Bile leak. EXAM: ABDOMINAL DRAIN PLACEMENT MEDICATIONS: The patient is currently admitted to the hospital and receiving intravenous antibiotics. The antibiotics were administered within an appropriate time frame prior to the initiation of the procedure. ANESTHESIA/SEDATION: Intubated COMPLICATIONS: None immediate. PROCEDURE: Informed written consent was obtained from the patient after a thorough discussion of the procedural risks, benefits and alternatives. All questions were addressed. Maximal Sterile Barrier Technique was utilized including caps, mask, sterile gowns, sterile gloves, sterile drape, hand hygiene and skin antiseptic. A timeout was performed prior to the initiation of the procedure. The epigastric region was prepped and draped in a sterile fashion. 1% lidocaine was utilized for local anesthesia. Under sonographic guidance, a micropuncture needle was inserted into the epigastric biloma. This was removed over a 018 wire which was up sized to an Amplatz. Ten [place] dilator followed by a 10 [place] drain were inserted. It was looped and string fixed. Bilious fluid was aspirated. FINDINGS: Images document 82 French drain placement into an upper abdominal biloma. IMPRESSION: Successful 10 French biloma drain placement. Electronically Signed   By: Marybelle Killings M.D.   On: 09/11/2017 12:59    Scheduled: . chlorhexidine gluconate (MEDLINE  KIT)  15 mL Mouth Rinse BID  . Chlorhexidine Gluconate Cloth  6 each Topical Daily  . darbepoetin (ARANESP) injection - DIALYSIS  150 mcg Intravenous Q Mon-HD  . doxercalciferol  2 mcg Intravenous Q M,W,F-HD  . hydrocortisone sod succinate (SOLU-CORTEF) inj  50 mg Intravenous Q6H  . insulin aspart  0-9 Units Subcutaneous Q4H  . mouth rinse  15 mL Mouth Rinse 10 times per day  . [START ON 09/13/2017] pantoprazole sodium  40 mg Per Tube Daily  . sodium chloride flush  10-40 mL Intracatheter Q12H  . sodium chloride flush  5 mL Intracatheter Q8H     LOS: 21 days   Estanislado Emms 09/12/2017,12:58 PM

## 2017-09-12 NOTE — Progress Notes (Signed)
PHARMACY - ADULT TOTAL PARENTERAL NUTRITION CONSULT NOTE   Pharmacy Consult:  TPN Indication: Intolerance to enteral nutrition  Patient Measurements: Height: 5\' 2"  (157.5 cm) Weight: 142 lb 3.2 oz (64.5 kg) IBW/kg (Calculated) : 50.1 TPN AdjBW (KG): 61.7 Body mass index is 26.01 kg/m.  Assessment:  11 YOF presented on 08/25/2017 with abdominal pain and vomiting, found to have cholecystitis and she underwent cholecystomy on 09/09/2017.  She was on a clear liquid diet before and after surgery, but did not tolerate.  Imaging showed bile leak with fluid collection around the liver, and patient aspirated prior to planned ERCP on.  She was started on TF on 09/05/17 but did not tolerate and retried on 09/06/17 without success.  Pharmacy consulted to initiate TPN for nutritional support.  Patient has moderate malnutrition and is at risk for refeeding syndrome.  GI: Drain output 825cc/24hr, no NG output, trickle feeds were re-attempted last night - pt has not tolerated. This has been a pattern for the patient   Endo: hypoglycemic prior to TPN, cbgs now low 105-160s, on d10 has been stopped, now on stress dose steroids Insulin requirements in the past 24 hours: 6 units Lytes: CoCa 9.9, K, Mg, Phos all okay today; does not appear to be refeeding Renal: ESRD on MWF HD now on CRRT  Pulm: intubated - FiO2 40% Cards: HTN, s/p NSTEMI.  MAP 50-60s, on Levophed + VP AC: s/p heparin for LUE DVT   hgb down to 8.9, plts 74 > 49, received multiple pRBC + FFP + Vit K over past couple days Hepatobil: Pt in shock liver, LFTs downtrending, Tbili still elevated, up to 12, INR 2  s/p ERCP 4/23 and CBD stent Neuro: sedated GCS 10, RASS -1, CPOT 0 ID: s/p Zosyn for PNA/bile leak - afebrile, WBC 49 > 13.3 TPN Access: R IJ placed 4/23 TPN start date: 09/08/17  Nutritional Goals (per RD rec on 09/11/17): 1250-1500 kCal, 110-12 g AA, 100 g lipid/week, 1.3 L/day  Current Nutrition:  Trickle feed   Plan:  -Increase TPN to a  rate of 40 ml/hr (goal ~50 ml/hr) -TPN will include 88 g AA, 180 g CHO, 15 g lipid to provide 1120 kcal/day; this will meet ~80% of patient needs -Lipids included due to prolonged NPO status; despite controversial guideline recommendation to hold lipids for first 7 days of critical illness vs first 7 days of TPN in this setting -Electrolytes to standard amount -IR:JJOACZY 1:1 -Add MVI, hold trace elements -Add Vit C, we do not have Bcomplex available to be used with the TPN compounder -Pt is severely malnourished, but with regards to severity of liver injury, I am not sure if TPN is helping or hurting     Harvel Quale 09/12/2017. 10:24 AM

## 2017-09-12 NOTE — Progress Notes (Signed)
PULMONARY / CRITICAL CARE MEDICINE   Name: Pam Fuller MRN: [id number] DOB: 1932/10/15    ADMISSION DATE:  09/06/2017 CONSULTATION DATE:  4/11  REFERRING MD:  Dr. Maudie Mercury  CHIEF COMPLAINT:  Aspiration  BRIEF SUMMARY:   Pam Fuller is a 84 year old female with ESRD on MWF HD, HTN, Anemia, and hx of breast cancer who presented to the ED on 4/4 with RLQ abdominal pain. She was found to have acute cholecystitis with a gallstone lodged in the gallbladder neck. CBD measured up to 10 mm. She underwent laparoscopic cholecystectomy on 4/6. She initially did well but on 4/8 was noted to have worsened pain and confusion and thought to have post-op ileus. CT abd/pelvis 4/8 showed small-mod fluid in the abdominal pelvis, read as normal post op changes but could not exclude a bile leak. A hepatobiliary scan was obtained on 4/9 which did suggest a bile leak. Surgery recommended IR drain and GI consult for ERCP. She went to the Endoscopy unit on 4/11 AM for ERCP however during prep with anesthesia she had significant bilious emesis with hypotension requiring intubation and pressor support. ERCP was not attempted. She was subsequently transferred to the medical ICU.   STUDIES:  CT abd/pelv 4/4 >> acute cholecystitis, gallstone in GB neck, CBD 10 mm  CT Head: no acute changes, chronic small vessel ischemic changes  CT abd/pelvis 4/8 >> small-mod fluid in abd/pelvis  Hepatobiliary scan 4/9 >> bile leak  TTE 4/12 >> EF 65-70%, G1DD, narrow LVOT, mild TR, mild pHTN  U/S Abdomen 4/14 >> small fluid near cholecystectomy site  Hepatobiliary scan 4/14 >> Rt subhepatic fluid collection consistent w/ biloma; increased in size from prior  CT abd/pelvis w/ oral contrast 4/16 >> drainage catheter at inferior liver margin with decreased perihepatic fluid; low-density fluid at left subdiaphragmatic area, questionable colonic wall thickening throughout, multifocal airspace disease of lung bases  CT  abdomen.pelvis 4/21: interval growth of large perihepatic and perisplenic fluid collection with multiple, new intraperitoneal fluid collections. Resolving multifocal pneumonia.    CULTURES: Tracheal aspirate 4/11 >> no growth Abd fluid 4/11 >> no growth 5 days Abd fluid 4/15 >> no growth 3 days  ANTIBIOTICS: Zosyn 4/3 >> 4/7 Unasyn 4/11 Zosyn 4/12 - 4/19 Zosyn 4/21-  LINES/TUBES: ETT 4/11 OGT 4/11 PIV RUE 4/9 RLQ Biliary Drain 4/11 HD Cath LIJ 4/12 >> 4/19 L femoral trialysis catheter 4/21> RIJ cath 4/23>   SIGNIFICANT EVENTS: 4/4 - admitted w/ acute cholecystitis 4/6 - lap chole 4/9 - bile leak seen on hepatobiliary scan 4/11 - Aspirated during prep for ERCP, intubated and transferred to ICU 4/11 - Rt abd fluid drain placed by IR 4/12- Heparin gtt started for NSTEMI; held after OG bleeding and trop peak 4/13 - worsening liver enzyymes. ESRD - Anuric. CRRT started. On levophed 81mcg and fent 158mcg 4/14 - Hgb 6.0, transfused 2 units PRBCs 4/17 - DVT LUE - started bivalirudin for suspected HIT 4/18 - CVVH d/c'ed, return to intermittent HD.  4/19 - Restarted HD. HIT negative  4/20 - Bleeding from GB drain, OGT, and stools. Received 1u pRBC. Heparin gtt held. 4/21 - Multiple, new fluid collections found on CT abd/pelvis   4/23 - ERCP with cystic duct bile leak s/p stent placement, erosive gastritis, and CBD dilation  4/24 - Mid-abdomen perc drain placed by IR   SUBJECTIVE/OVERNIGHT/INTERVAL HX -  Hypothermic. Continues to have labile BPs, now on levo and vaso. On Fentanyl gtt and unarousable to voice.  VITAL SIGNS: BP 126/65   Pulse 85   Temp (!) 97.4 F (36.3 C) (Oral)   Resp 20   Ht 5\' 2"  (1.575 m)   Wt 142 lb 3.2 oz (64.5 kg)   SpO2 100%   BMI 26.01 kg/m   HEMODYNAMICS:    VENTILATOR SETTINGS: Vent Mode: PRVC FiO2 (%):  [30 %] 30 % Set Rate:  [20 bmp] 20 bmp Vt Set:  [400 mL] 400 mL PEEP:  [5 cmH20] 5 cmH20 Plateau Pressure:  [23 cmH20-25 cmH20] 24  cmH20  INTAKE / OUTPUT: I/O last 3 completed shifts: In: 6641.3 [I.V.:3333.6; Blood:2057.7; Other:10; NG/GT:90; IV Piggyback:1150] Out: 4742 [Drains:675; VZDGL:8756]  PHYSICAL EXAMINATION:  General: patient is sedated and unresponsive to verbal stimuli  HEENT: NCAT, scleral icterus, ET tube in place, RIJ cath in place with dressing c/d/i Cardiac:  RRR, no mrg  Pulm: decreased breath sounds at bases, no wheezing  Abd: pain on abdomen palpation, soft, non-distended, decreased bowel sounds  Ext: LUE edema L>>R unchanged from yesterday, L AVF with palpable thrill, femoral trialysis cathter in place with dressing c/d/i  Neuro:  Pupils equal and symmetric, does not arouse to voice but grimaces to pain, moves all 4 extremities spontaneously but no purposefully, does not follow commands     PULMONARY Recent Labs  Lab 09/08/17 0945 09/09/17 0952 09/03/2017 1840  PHART 7.253* 7.330* 7.335*  PCO2ART 25.9* 28.4* 42.6  PO2ART 166* 118.0* 95.0  HCO3 11.1* 15.1* 22.1  TCO2  --  16*  --   O2SAT 98.6 99.0 97.5    CBC Recent Labs  Lab 09/11/17 0450 09/11/17 1515 09/12/17 0421  HGB 7.5* 9.3* 8.9*  HCT 22.9* 28.5* 26.8*  WBC 18.6* 15.0* 13.3*  PLT 74* 57* 49*    COAGULATION Recent Labs  Lab 09/12/2017 1006 08/20/2017 1551 09/17/2017 2300 09/11/17 0450 09/12/17 0421  INR 2.27 2.16 1.80 1.77 2.27    CARDIAC   Recent Labs  Lab 09/09/17 1011  TROPONINI 0.49*   No results for input(s): PROBNP in the last 168 hours.   CHEMISTRY Recent Labs  Lab 09/09/17 0400  09/06/2017 0435 09/06/2017 1551 09/11/17 0450 09/11/17 1515 09/11/17 1520 09/12/17 0421  NA 135   < > 134* 134* 133* 135  --  136  K 3.7   < > 4.2 3.7 3.4* 4.1  --  4.1  CL 96*   < > 99* 99* 98* 100*  --  104  CO2 22   < > 20* 21* 24 26  --  22  GLUCOSE 101*   < > 100* 153* 162* 163*  --  147*  BUN 17   < > 7 9 8 8   --  11  CREATININE 2.21*   < > 1.30* 1.20* 0.90 0.85  --  1.02*  CALCIUM 7.8*   < > 8.4* 8.4* 8.2* 8.0*   --  8.3*  MG 2.4  --  2.3  --  2.3  --  2.2 2.1  PHOS 3.5   < >  --  3.1 1.6* 2.2* 2.2* 2.7   < > = values in this interval not displayed.   Estimated Creatinine Clearance: 37.5 mL/min (A) (by C-G formula based on SCr of 1.02 mg/dL (H)).  I/O last 3 completed shifts: In: 6641.3 [I.V.:3333.6; Blood:2057.7; Other:10; NG/GT:90; IV Piggyback:1150] Out: 4332 [Drains:675; RJJOA:4166]   Intake/Output Summary (Last 24 hours) at 09/12/2017 0654 Last data filed at 09/12/2017 0630 Gross per 24 hour  Intake 2940.93 ml  Output 2761  ml  Net 179.93 ml     LIVER Recent Labs  Lab 09/08/17 1021 09/09/17 0400  09/16/2017 0435 08/25/2017 1006 09/06/2017 1551 08/30/2017 2300 09/11/17 0450 09/11/17 1515 09/12/17 0421  AST 851* 2,141*  --  3,665*  --   --   --  1,527*  --  723*  ALT 257* 613*  --  819*  --   --   --  509*  --  359*  ALKPHOS 350* 484*  --  515*  --   --   --  461*  --  481*  BILITOT 4.0* 5.3*  --  8.6*  --   --   --  9.8*  --  12.0*  PROT 4.4* 5.1*  --  5.7*  --   --   --  6.1*  --  5.6*  ALBUMIN 1.3* 1.7*   < > 2.3*  --  2.4*  --  2.5* 2.3* 2.0*  INR  --   --    < > 2.38 2.27 2.16 1.80 1.77  --  2.27   < > = values in this interval not displayed.     INFECTIOUS Recent Labs  Lab 09/08/17 1552 09/13/2017 1906 09/11/17 0451  LATICACIDVEN 10.8* 5.5* 3.0*     ENDOCRINE CBG (last 3)  Recent Labs    09/11/17 1540 09/11/17 1926 09/11/17 2314  GLUCAP 151* 131* 126*     IMAGING x48h  - image(s) personally visualized  -   highlighted in bold Ir Guided Drain W Catheter Placement  Result Date: 09/11/2017 INDICATION: Biloma.  Bile leak. EXAM: ABDOMINAL DRAIN PLACEMENT MEDICATIONS: The patient is currently admitted to the hospital and receiving intravenous antibiotics. The antibiotics were administered within an appropriate time frame prior to the initiation of the procedure. ANESTHESIA/SEDATION: Intubated COMPLICATIONS: None immediate. PROCEDURE: Informed written consent was  obtained from the patient after a thorough discussion of the procedural risks, benefits and alternatives. All questions were addressed. Maximal Sterile Barrier Technique was utilized including caps, mask, sterile gowns, sterile gloves, sterile drape, hand hygiene and skin antiseptic. A timeout was performed prior to the initiation of the procedure. The epigastric region was prepped and draped in a sterile fashion. 1% lidocaine was utilized for local anesthesia. Under sonographic guidance, a micropuncture needle was inserted into the epigastric biloma. This was removed over a 018 wire which was up sized to an Amplatz. Ten [place] dilator followed by a 10 [place] drain were inserted. It was looped and string fixed. Bilious fluid was aspirated. FINDINGS: Images document 65 French drain placement into an upper abdominal biloma. IMPRESSION: Successful 10 French biloma drain placement. Electronically Signed   By: Marybelle Killings M.D.   On: 09/11/2017 12:59   Ir US Guide Bx Asp/drain  Result Date: 09/11/2017 INDICATION: Biloma.  Bile leak. EXAM: ABDOMINAL DRAIN PLACEMENT MEDICATIONS: The patient is currently admitted to the hospital and receiving intravenous antibiotics. The antibiotics were administered within an appropriate time frame prior to the initiation of the procedure. ANESTHESIA/SEDATION: Intubated COMPLICATIONS: None immediate. PROCEDURE: Informed written consent was obtained from the patient after a thorough discussion of the procedural risks, benefits and alternatives. All questions were addressed. Maximal Sterile Barrier Technique was utilized including caps, mask, sterile gowns, sterile gloves, sterile drape, hand hygiene and skin antiseptic. A timeout was performed prior to the initiation of the procedure. The epigastric region was prepped and draped in a sterile fashion. 1% lidocaine was utilized for local anesthesia. Under sonographic guidance, a micropuncture needle was inserted into  the epigastric  biloma. This was removed over a 018 wire which was up sized to an Amplatz. Ten [place] dilator followed by a 10 [place] drain were inserted. It was looped and string fixed. Bilious fluid was aspirated. FINDINGS: Images document 22 French drain placement into an upper abdominal biloma. IMPRESSION: Successful 10 French biloma drain placement. Electronically Signed   By: Marybelle Killings M.D.   On: 09/11/2017 12:59   Dg Chest Port 1 View  Result Date: 09/11/2017 CLINICAL DATA:  Endotracheal tube.  Respiratory failure EXAM: PORTABLE CHEST 1 VIEW COMPARISON:  08/22/2017 FINDINGS: Endotracheal tube in good position. NG tube in the stomach. Right jugular central venous catheter tip cavoatrial junction unchanged. No pneumothorax Improved aeration of the lung. Slightly improved lung volume with improvement in diffuse bilateral airspace disease. No pleural effusion. IMPRESSION: Improved lung volume and improvement in diffuse bilateral airspace disease. Electronically Signed   By: Franchot Gallo M.D.   On: 09/11/2017 11:05   Dg Chest Port 1 View  Result Date: 08/30/2017 CLINICAL DATA:  Status post central line placement EXAM: PORTABLE CHEST 1 VIEW COMPARISON:  Portable chest x-ray of today's date at 5:08 a.m. FINDINGS: The patient has undergone placement of a right internal jugular venous catheter. The tip projects over the junction of the middle and distal thirds of the SVC. The lung volumes remain low. There is no pneumothorax or pleural effusion. The interstitial markings are coarse throughout the right lung and at the left lung base. The endotracheal tube tip projects 3.6 cm above the carina. The esophagogastric tube tip and proximal port project below the inferior margin of the image. IMPRESSION: No postprocedure complication following right internal jugular venous catheter placement. The other support tubes are in reasonable position. Electronically Signed   By: David  Martinique M.D.   On: 09/03/2017 12:10   Dg  Ercp  Result Date: 08/29/2017 CLINICAL DATA:  84 year old female undergoing ERCP for biliary leak EXAM: ERCP TECHNIQUE: Multiple spot images obtained with the fluoroscopic device and submitted for interpretation post-procedure. FLUOROSCOPY TIME:  Fluoroscopy Time:  0 minutes 45 seconds COMPARISON:  Most recent CT scan of the abdomen and pelvis 09/08/2017 FINDINGS: A total of 3 intraoperative images in a cine clip are submitted for interpretation. The images demonstrate a flexible endoscope in the descending duodenum with deep wire cannulation of the hepatic ducts. Cholangiogram demonstrates a mildly dilated common bile duct with subtle filling defects in the distal common duct. There is extravasation of injected contrast material from the cystic duct remnant. The subsequent images demonstrate placement of plastic biliary stent. A percutaneous drainage catheter is present in the right upper quadrant. IMPRESSION: 1. Biliary leak emanating from the cystic duct. 2. Filling defects within the distal common duct may represent stones and/or sludge. 3. Placement of a plastic biliary stent. These images were submitted for radiologic interpretation only. Please see the procedural report for the amount of contrast and the fluoroscopy time utilized. Electronically Signed   By: Jacqulynn Cadet M.D.   On: 09/09/2017 14:58     DISCUSSION: Pam Fuller is a 84 year old female with ESRD on MWF HD, HTN, Anemia, and hx of breast cancer who was admitted 4/4 with acute cholecystitis. S/p lap chole 4/6 complicated by post-op bile leak. Went for ERCP 4/11 however developed bilious emesis during prep, hypotension, and required intubation plus pressor support. Transferred to the ICU on 4/11.  ASSESSMENT / PLAN: PULMONARY A: Acute Respiratory Distress 2/2 Aspiration Requiring Mechanical Ventilation since 4/11 P:   -  Continue vent support for now. Will attempt to wean today.  - VAP protocol   CARDIOVASCULAR A:   #baseline Aortic Atherosclerosis on CXR RBBB - present prior to admission #current Septic Shock 2/2 biliary peritonitis   Troponin elevation (peak 4.47) 2/2 demand ischemia in setting of hypotensive event - resolved  Echocardiogram w/ EF 65-70% Prolonged QTc LUE DVT - heparin started 4/19 and stopped 4/21 P:  - Continues on levophed and vasopressin  - Start stress dose steroids with hydrocortisone 50 mg q6h in the setting of ongoing hypotension while on 2 pressors  - MAP goal > 65 - Heparin stopped in the setting of acute blood loss 4/21   RENAL  A:   ESRD on MWF HD AVF LUE P:   - CRRT restarted on 4/21  - Nephrology following  - Follow up output and monitor electrolytes  - Replace electrolytes as indicated  GASTROINTESTINAL A:   Acute Cholecystitis s/p lap chole 4/6 Bile leak/biliary peritonitis s/p IR Rt abdominal fluid drain 4/11 - biloma by HIDA 4/14 Shock Liver Multiple new intraperitoneal abscesses/biloma found on 4/21 Severe protein calorie malnutrition  Erosive gastritis seen on ERCP 4/23 SUP P:   - s/p ERCP 4/23 with stent placement that will need removal in 6-8 weeks   - INR 2.22, will continue to monitor  - S/p mid-abdomen drain placed by IR 4/24  - Zosyn 4/21>  - Right abdominal fluid drain in place with minimal drainage, being drain q8h  - Appreciate surgery and GI  Assistance. GI signed off 4/24.  - Continue to monitor transaminases, improving  - TPN started 4/23. Tub feeds started 4/24. May d/c TPN tomorrow if tolerates up titration of tube feeds  - Continue Protonix as ordered  HEMATOLOGIC A:   Hx of Breast Cancer - no acute issue Acute on chronic anemia (AOCD) - requiring multiple transfusions, now stable with Hgb 9 Leukocytosis -improving  Thrombocytopenia - Plt 49 LUE (IJV) DVT Acute blood loss anemia requiring multiple transfusions  P:  - Hgb goal > 7 - Heparin stopped 4/20 - CBC daily  - Bivalirudin stopped 4/20, heparin initiated after  HIT antibody was negative  INFECTIOUS A:   Septic Shock from Aspiration PNA - resolved  Bile Leak s/p IR Biliary Drain 4/11 Septic shock 2/2 new intraperitoneal abscesses/biloma Leukocytosis WBC 49 4/23 - likely leukemoid reaction, WBC 13 today  P:   - Zosyn 4/12 >> 4/19. Restarted Zosyn 4/21 - S/p ERCP with stent placement  - S/p IR with mid abdomen drain placement  - Follow up new drain output culture   ENDOCRINE A:   Secondary Hyperparathyroidism Hypoglycemia - likely 2/2 shock liver. Resolved P:   - TPN started 4/23 - Trickle tube feeds started 4/24. Will discontinue TPN tomorrow if continues to tolerate up titration of tube feeds  - CBG monitoring q4h  - CBG goal < 180  NEUROLOGIC A:   Pain/Agitation Anisocoria  P:   - Fentanyl gtt, will wean in the setting on ongoing hypotension  - RASS goal: 0 to -1  - Fentanyl and Versed as needed   FAMILY  - Updates: Sons updated at bedside 4/24.    Welford Roche, MD  Internal Medicine PGY-1  P [phone]

## 2017-09-12 NOTE — Progress Notes (Signed)
Nutrition Follow-up  DOCUMENTATION CODES:   Non-severe (moderate) malnutrition in context of acute illness/injury  INTERVENTION:   - TPN per pharmacy: Increased today to 40 ml/hr with goal 50 ml/hr Current TPN provides 88 grams AA, 180 grams CHO, and 15 grams lipids to provide 1120 kcal/day  - Per MD, increase tube feeds of Vital High Protein to 20 ml/hr today (480 ml/day) and increase by 10 ml/hr every 24 hours until goal rate of 55 ml/hr is reached (1320 ml/day)  Goal TF provides 1320 kcal (100% estimated energy needs), 116 grams protein (100% estimated protein needs), and 1109 ml free water.  - Free water flushes per MD  - If pt does not tolerate tube feed advancement to goal, recommend placing post-pyloric Cortrak  Monitor magnesium, potassium, and phosphorus daily for at least 3 days, MD to replete as needed, as pt is at risk for refeeding syndrome.  NUTRITION DIAGNOSIS:   Moderate Malnutrition related to acute illness(cholecystitis, bile leak) as evidenced by energy intake < 75% for > 7 days, mild muscle depletion.  Ongoing  GOAL:   Patient will meet greater than or equal to 90% of their needs  Progressing with TPN and tube feeds  MONITOR:   Other (Comment), TF tolerance, Skin, I & O's, Vent status, Labs(TPN tolerance)  REASON FOR ASSESSMENT:   Consult Enteral/tube feeding initiation and management  ASSESSMENT:   84 year old ESRD on HD,underwent laparoscopic cholecystectomy on 4/6 after being admitted on 4/3 for acute cholecystitis, developed a biliary leak subsequently demonstrated an biliary scan 4/9. Plan was for ERCP 4/11 but during induction , aspirated, was intubated and transferred to ICU. Drain placed by IR .  Course further complicated by left upper extremity DVT and non-STEMI requiring heparin.  She developed shock on 4/20 with bleeding from abdominal drain.  4/6 - cholecystectomy  4/9 - HIDA scan noted bile leak 4/11 - pt aspirated during ERCP  attempt, procedure not completed, IR biliary drain placed, pt intubated  4/12 - LIJ cath placed 4/13 - CRRT initiated 4/18 - d/c CRRT 4/19 - TFs initiated at trickle 4/20 - d/c tube feeds 4/21 - 2 units prbcs, CRRT initiated for volume control, CT scan showing new fluid collections around liver and spleen 4/23 - RIJ cath placed, ERCP with stent placement, TPN initiated 4/24 - IR drain placement, D10 stopped, trickle TF initiated  TPN was changed to include lipids yesterday. Vitamin C and MVI were added. Unable to infuse B complex via TPN. TPN infusing @ 25 ml/hr at RD time of visit.  Vital High Protein infusing via OGT @ 20 ml/hr at time of RD visit and increased to 40 ml/hr shortly after visit. Spoke with RN who decreased rate to 10 ml/hr per RD recommendations yesterday. Free water flushes programmed in pump at 30 ml every 4 hours.  Later spoke with MD who reports plan is to increase TF to 20 ml/hr today and increase by 10 ml/hr every 24 hours until goal rate. MD approved RD placing order. Per MD, likely d/c TPN once pt reaches goal TF rate and is tolerating well.  Patient is currently intubated on ventilator support MV: 8.0 L/min Temp (24hrs), Avg:97.2 F (36.2 C), Min:96.1 F (35.6 C), Max:98.4 F (36.9 C) BP: 97/52 MAP: 66  Propofol: none  Drain output: 825 ml bloody output x 24 hours I/O's: +10.3 L since 09/12/2017  Medications reviewed and include: sliding scale Novolog, 40 mg Protonix BID, IV antibiotics Drips: Fentanyl @15  ml/hr Vasopressin @ 11.3 ml/hr Norepinephrine @  13.1 ml/hr  Labs reviewed: creatinine 1.02 (H), AST and ALT elevated, hemoglobin 8.9 (L), HCT 26.8 (L) Phosphorus 2.7, potassium 4.1, magnesium 2.1 - all WNL  Diet Order:  Diet NPO time specified TPN ADULT (ION) TPN ADULT (ION)  EDUCATION NEEDS:   No education needs have been identified at this time  Skin:  Skin Assessment: Skin Integrity Issues: Skin Integrity Issues:: Incisions Incisions:  abdomen, breast  Last BM:  09/11/17  Height:   Ht Readings from Last 1 Encounters:  09/02/17 5\' 2"  (1.575 m)    Weight:   Wt Readings from Last 1 Encounters:  09/12/17 142 lb 3.2 oz (64.5 kg)    Ideal Body Weight:  50 kg  BMI:  Body mass index is 26.01 kg/m.  Estimated Nutritional Needs:   Kcal:  1250-1500kcal/day (25-30kcal/kg IBW)  Protein:  110-123g/day (1.8-2.0g/day admit wt)  Fluid:  >1.3L/day     Gaynell Face, MS, RD, LDN Pager: [phone] Weekend/After Hours: [phone]

## 2017-09-12 NOTE — Anesthesia Postprocedure Evaluation (Signed)
Anesthesia Post Note  Patient: Pam Fuller  Procedure(s) Performed: ENDOSCOPIC RETROGRADE CHOLANGIOPANCREATOGRAPHY (ERCP) (N/A )     Patient location during evaluation: ICU Anesthesia Type: General Level of consciousness: sedated Pain management: pain level controlled Vital Signs Assessment: post-procedure vital signs reviewed and stable Respiratory status: patient on ventilator - see flowsheet for VS and patient remains intubated per anesthesia plan Cardiovascular status: blood pressure returned to baseline Anesthetic complications: no    Last Vitals:  Vitals:   09/12/17 1930 09/12/17 2000  BP: 127/62 (!) 145/61  Pulse:    Resp: (!) 22 (!) 21  Temp:    SpO2: 98% 100%    Last Pain:  Vitals:   09/12/17 1500  TempSrc: Axillary  PainSc:                  Jordyn Doane COKER

## 2017-09-12 NOTE — Progress Notes (Signed)
[facility] Progress Note  2 Days Post-Op  Subjective: CC: sepsis Underwernt successful ERCP/stent 4/23 for cystic stump leak- T bili continues to trend up Hemodynamically unstable, continues to require pressors Patient remains on the ventilator. CT 4/21 showed growth of perihepatic and perisplenic fluid collections - Additional biloma drain placed 4/24 with significant bilious output Patient had no further bloody BMs last 24h    Objective: Vital signs in last 24 hours: Temp:  [96.1 F (35.6 C)-98.4 F (36.9 C)] 97.4 F (36.3 C) (04/25 0313) Pulse Rate:  [69-85] 85 (04/25 0305) Resp:  [18-26] 20 (04/25 0600) BP: (90-176)/(42-86) 126/65 (04/25 0600) SpO2:  [83 %-100 %] 100 % (04/25 0600) FiO2 (%):  [30 %] 30 % (04/25 0305) Weight:  [64.5 kg (142 lb 3.2 oz)] 64.5 kg (142 lb 3.2 oz) (04/25 0427) Last BM Date: 09/17/2017  Intake/Output from previous day: 04/24 0701 - 04/25 0700 In: 2914.4 [I.V.:1935.1; Blood:315; NG/GT:154.3; IV Piggyback:500] Out: 2789 [Drains:825] Intake/Output this shift: No intake/output data recorded.  PE: Gen: on the vent, somnolent Card:  Regular rate and irregular rhythm Pulm:  Slightly tachypneic, inspiratory crackles in bases bilaterally  Abd: Soft, distended, incisions C/D/I, drain x 2 in RUQ with bloody/bilious output Skin: warm and dry, no rashes    Lab Results:  Recent Labs    09/11/17 1515 09/12/17 0421  WBC 15.0* 13.3*  HGB 9.3* 8.9*  HCT 28.5* 26.8*  PLT 57* 49*   BMET Recent Labs    09/11/17 1515 09/12/17 0421  NA 135 136  K 4.1 4.1  CL 100* 104  CO2 26 22  GLUCOSE 163* 147*  BUN 8 11  CREATININE 0.85 1.02*  CALCIUM 8.0* 8.3*   PT/INR Recent Labs    09/11/17 0450 09/12/17 0421  LABPROT 20.4* 24.8*  INR 1.77 2.27   CMP     Component Value Date/Time   NA 136 09/12/2017 0421   NA 142 02/12/2017 1400   K 4.1 09/12/2017 0421   K 3.9 02/12/2017 1400   CL 104 09/12/2017 0421   CO2 22 09/12/2017 0421    CO2 30 (H) 02/12/2017 1400   GLUCOSE 147 (H) 09/12/2017 0421   GLUCOSE 88 02/12/2017 1400   BUN 11 09/12/2017 0421   BUN 16.9 02/12/2017 1400   CREATININE 1.02 (H) 09/12/2017 0421   CREATININE 6.2 (HH) 02/12/2017 1400   CALCIUM 8.3 (L) 09/12/2017 0421   CALCIUM 9.2 02/12/2017 1400   PROT 5.6 (L) 09/12/2017 0421   PROT 6.8 02/12/2017 1400   ALBUMIN 2.0 (L) 09/12/2017 0421   ALBUMIN 3.3 (L) 02/12/2017 1400   AST 723 (H) 09/12/2017 0421   AST 13 02/12/2017 1400   ALT 359 (H) 09/12/2017 0421   ALT 7 02/12/2017 1400   ALKPHOS 481 (H) 09/12/2017 0421   ALKPHOS 75 02/12/2017 1400   BILITOT 12.0 (H) 09/12/2017 0421   BILITOT 0.68 02/12/2017 1400   GFRNONAA 50 (L) 09/12/2017 0421   GFRAA 58 (L) 09/12/2017 0421   Lipase     Component Value Date/Time   LIPASE 36 09/12/2017 1618       Studies/Results: Ir Lenise Arena W Catheter Placement  Result Date: 09/11/2017 INDICATION: Biloma.  Bile leak. EXAM: ABDOMINAL DRAIN PLACEMENT MEDICATIONS: The patient is currently admitted to the hospital and receiving intravenous antibiotics. The antibiotics were administered within an appropriate time frame prior to the initiation of the procedure. ANESTHESIA/SEDATION: Intubated COMPLICATIONS: None immediate. PROCEDURE: Informed written consent was obtained from the patient after a thorough discussion  of the procedural risks, benefits and alternatives. All questions were addressed. Maximal Sterile Barrier Technique was utilized including caps, mask, sterile gowns, sterile gloves, sterile drape, hand hygiene and skin antiseptic. A timeout was performed prior to the initiation of the procedure. The epigastric region was prepped and draped in a sterile fashion. 1% lidocaine was utilized for local anesthesia. Under sonographic guidance, a micropuncture needle was inserted into the epigastric biloma. This was removed over a 018 wire which was up sized to an Amplatz. Ten [place] dilator followed by a 10 [place]  drain were inserted. It was looped and string fixed. Bilious fluid was aspirated. FINDINGS: Images document 68 French drain placement into an upper abdominal biloma. IMPRESSION: Successful 10 French biloma drain placement. Electronically Signed   By: Marybelle Killings M.D.   On: 09/11/2017 12:59   Ir US Guide Bx Asp/drain  Result Date: 09/11/2017 INDICATION: Biloma.  Bile leak. EXAM: ABDOMINAL DRAIN PLACEMENT MEDICATIONS: The patient is currently admitted to the hospital and receiving intravenous antibiotics. The antibiotics were administered within an appropriate time frame prior to the initiation of the procedure. ANESTHESIA/SEDATION: Intubated COMPLICATIONS: None immediate. PROCEDURE: Informed written consent was obtained from the patient after a thorough discussion of the procedural risks, benefits and alternatives. All questions were addressed. Maximal Sterile Barrier Technique was utilized including caps, mask, sterile gowns, sterile gloves, sterile drape, hand hygiene and skin antiseptic. A timeout was performed prior to the initiation of the procedure. The epigastric region was prepped and draped in a sterile fashion. 1% lidocaine was utilized for local anesthesia. Under sonographic guidance, a micropuncture needle was inserted into the epigastric biloma. This was removed over a 018 wire which was up sized to an Amplatz. Ten [place] dilator followed by a 10 [place] drain were inserted. It was looped and string fixed. Bilious fluid was aspirated. FINDINGS: Images document 74 French drain placement into an upper abdominal biloma. IMPRESSION: Successful 10 French biloma drain placement. Electronically Signed   By: Marybelle Killings M.D.   On: 09/11/2017 12:59   Dg Chest Port 1 View  Result Date: 09/11/2017 CLINICAL DATA:  Endotracheal tube.  Respiratory failure EXAM: PORTABLE CHEST 1 VIEW COMPARISON:  09/02/2017 FINDINGS: Endotracheal tube in good position. NG tube in the stomach. Right jugular central venous  catheter tip cavoatrial junction unchanged. No pneumothorax Improved aeration of the lung. Slightly improved lung volume with improvement in diffuse bilateral airspace disease. No pleural effusion. IMPRESSION: Improved lung volume and improvement in diffuse bilateral airspace disease. Electronically Signed   By: Franchot Gallo M.D.   On: 09/11/2017 11:05   Dg Chest Port 1 View  Result Date: 08/30/2017 CLINICAL DATA:  Status post central line placement EXAM: PORTABLE CHEST 1 VIEW COMPARISON:  Portable chest x-ray of today's date at 5:08 a.m. FINDINGS: The patient has undergone placement of a right internal jugular venous catheter. The tip projects over the junction of the middle and distal thirds of the SVC. The lung volumes remain low. There is no pneumothorax or pleural effusion. The interstitial markings are coarse throughout the right lung and at the left lung base. The endotracheal tube tip projects 3.6 cm above the carina. The esophagogastric tube tip and proximal port project below the inferior margin of the image. IMPRESSION: No postprocedure complication following right internal jugular venous catheter placement. The other support tubes are in reasonable position. Electronically Signed   By: David  Martinique M.D.   On: 09/04/2017 12:10   Dg Ercp  Result Date: 09/02/2017 CLINICAL DATA:  84 year old female undergoing ERCP for biliary leak EXAM: ERCP TECHNIQUE: Multiple spot images obtained with the fluoroscopic device and submitted for interpretation post-procedure. FLUOROSCOPY TIME:  Fluoroscopy Time:  0 minutes 45 seconds COMPARISON:  Most recent CT scan of the abdomen and pelvis 09/08/2017 FINDINGS: A total of 3 intraoperative images in a cine clip are submitted for interpretation. The images demonstrate a flexible endoscope in the descending duodenum with deep wire cannulation of the hepatic ducts. Cholangiogram demonstrates a mildly dilated common bile duct with subtle filling defects in the distal  common duct. There is extravasation of injected contrast material from the cystic duct remnant. The subsequent images demonstrate placement of plastic biliary stent. A percutaneous drainage catheter is present in the right upper quadrant. IMPRESSION: 1. Biliary leak emanating from the cystic duct. 2. Filling defects within the distal common duct may represent stones and/or sludge. 3. Placement of a plastic biliary stent. These images were submitted for radiologic interpretation only. Please see the procedural report for the amount of contrast and the fluoroscopy time utilized. Electronically Signed   By: Jacqulynn Cadet M.D.   On: 09/07/2017 14:58    Anti-infectives: Anti-infectives (From admission, onward)   Start     Dose/Rate Route Frequency Ordered Stop   09/08/17 2000  piperacillin-tazobactam (ZOSYN) IVPB 3.375 g     3.375 g 100 mL/hr over 30 Minutes Intravenous Every 6 hours 09/08/17 1946     09/05/17 2000  piperacillin-tazobactam (ZOSYN) IVPB 3.375 g     3.375 g 12.5 mL/hr over 240 Minutes Intravenous Every 12 hours 09/05/17 1126 09/07/17 0154   08/31/17 0800  piperacillin-tazobactam (ZOSYN) IVPB 3.375 g  Status:  Discontinued     3.375 g 100 mL/hr over 30 Minutes Intravenous Every 6 hours 08/31/17 0739 09/05/17 1126   08/30/17 2000  Ampicillin-Sulbactam (UNASYN) 3 g in sodium chloride 0.9 % 100 mL IVPB  Status:  Discontinued     3 g 200 mL/hr over 30 Minutes Intravenous Every 24 hours 08/28/2017 0953 08/30/17 0945   08/30/17 1230  piperacillin-tazobactam (ZOSYN) IVPB 3.375 g  Status:  Discontinued     3.375 g 12.5 mL/hr over 240 Minutes Intravenous Every 12 hours 08/30/17 1134 08/31/17 0738   08/26/2017 1030  Ampicillin-Sulbactam (UNASYN) 3 g in sodium chloride 0.9 % 100 mL IVPB     3 g 200 mL/hr over 30 Minutes Intravenous  Once 08/26/2017 0953 08/23/2017 1246   08/20/2017 0000  ampicillin-sulbactam (UNASYN) 1.5 g in sodium chloride 0.9 % 100 mL IVPB  Status:  Discontinued     1.5 g 200  mL/hr over 30 Minutes Intravenous Once 08/28/17 1045 08/28/2017 0928   08/24/2017 2200  piperacillin-tazobactam (ZOSYN) IVPB 3.375 g    Note to Pharmacy:  Zosyn 3.375 g IV q12h in ESRD on HD   3.375 g 12.5 mL/hr over 240 Minutes Intravenous Every 12 hours 08/23/2017 1155 08/25/17 1335   08/22/17 1000  piperacillin-tazobactam (ZOSYN) IVPB 3.375 g  Status:  Discontinued    Note to Pharmacy:  Zosyn 3.375 g IV q12h in ESRD on HD   3.375 g 12.5 mL/hr over 240 Minutes Intravenous Every 12 hours 08/22/17 0128 08/31/2017 1155   08/22/17 0045  piperacillin-tazobactam (ZOSYN) IVPB 3.375 g     3.375 g 12.5 mL/hr over 240 Minutes Intravenous  Once 08/22/17 0038 08/22/17 0145       Assessment/Plan HTN ESRD - HD MWF H/o breast cancer  Acute cholecystitis POD16 s/p lap chole4/6/19 by Dr. Redmond Pulling - CT scan 4/8 w/  small amount fluid around liver and layering in pelvis ; HIDA 4/9 positive for bile leak - CT 4/21 with multiple fluid collections - additional drain placed 4/24 with good output -ERCP 4/23 w stent placement (Dr Fuller Plan) Shock liver -Total bilirubin uptrending  VDRF secondary to aspiration/PNA -CCMfollowing - appreciate assistance.   Leukocytosis Improving- Likely secondary to aspiration PNA and bile leak  On Zosyn.   ABL Anemia + chronic disease - Continues to be coagulopathic in setting of ESRD/ shock liver  Lactic acidosis - improved on bicarb drip and CVVH, appreciate CCM assistance  ID -zosyn 4/4>>4/7,Unasyn 4/11-->4/12, Zosyn 4/12 --> FEN -NPO/OGT- ok to try trickle feeds from surgery standpoint  VTE -SCDs Foley -none, anuric Follow up -Dr. Redmond Pulling    LOS: 21 days   Clovis Riley, MD

## 2017-09-13 ENCOUNTER — Inpatient Hospital Stay (HOSPITAL_COMMUNITY): Payer: Medicare Other

## 2017-09-13 DIAGNOSIS — K922 Gastrointestinal hemorrhage, unspecified: Secondary | ICD-10-CM

## 2017-09-13 LAB — PREPARE FRESH FROZEN PLASMA
UNIT DIVISION: 0
Unit division: 0

## 2017-09-13 LAB — CBC
HCT: 26.2 % — ABNORMAL LOW (ref 36.0–46.0)
HCT: 26.2 % — ABNORMAL LOW (ref 36.0–46.0)
HEMATOCRIT: 23.6 % — AB (ref 36.0–46.0)
HEMOGLOBIN: 8.6 g/dL — AB (ref 12.0–15.0)
HEMOGLOBIN: 8.3 g/dL — AB (ref 12.0–15.0)
Hemoglobin: 7.4 g/dL — ABNORMAL LOW (ref 12.0–15.0)
MCH: 30.2 pg (ref 26.0–34.0)
MCH: 29.4 pg (ref 26.0–34.0)
MCH: 29.1 pg (ref 26.0–34.0)
MCHC: 32.8 g/dL (ref 30.0–36.0)
MCHC: 31.7 g/dL (ref 30.0–36.0)
MCHC: 31.4 g/dL (ref 30.0–36.0)
MCV: 91.9 fL (ref 78.0–100.0)
MCV: 92.9 fL (ref 78.0–100.0)
MCV: 92.9 fL (ref 78.0–100.0)
PLATELETS: 130 10*3/uL — AB (ref 150–400)
Platelets: 41 10*3/uL — ABNORMAL LOW (ref 150–400)
Platelets: 42 10*3/uL — ABNORMAL LOW (ref 150–400)
RBC: 2.85 MIL/uL — AB (ref 3.87–5.11)
RBC: 2.82 MIL/uL — ABNORMAL LOW (ref 3.87–5.11)
RBC: 2.54 MIL/uL — AB (ref 3.87–5.11)
RDW: 17.9 % — ABNORMAL HIGH (ref 11.5–15.5)
RDW: 18.3 % — AB (ref 11.5–15.5)
RDW: 18.3 % — AB (ref 11.5–15.5)
WBC: 16.9 10*3/uL — AB (ref 4.0–10.5)
WBC: 17 10*3/uL — AB (ref 4.0–10.5)
WBC: 15.1 10*3/uL — AB (ref 4.0–10.5)

## 2017-09-13 LAB — RENAL FUNCTION PANEL
Albumin: 2.4 g/dL — ABNORMAL LOW (ref 3.5–5.0)
Anion gap: 15 (ref 5–15)
BUN: 19 mg/dL (ref 6–20)
CHLORIDE: 101 mmol/L (ref 101–111)
CO2: 22 mmol/L (ref 22–32)
CREATININE: 0.92 mg/dL (ref 0.44–1.00)
Calcium: 9.1 mg/dL (ref 8.9–10.3)
GFR calc Af Amer: >60 mL/min (ref >60)
GFR calc non Af Amer: 56 mL/min — ABNORMAL LOW (ref >60)
GLUCOSE: 178 mg/dL — AB (ref 65–99)
Phosphorus: 3.2 mg/dL (ref 2.5–4.6)
Potassium: 4.2 mmol/L (ref 3.5–5.1)
Sodium: 138 mmol/L (ref 135–145)

## 2017-09-13 LAB — COMPREHENSIVE METABOLIC PANEL
ALT: 275 U/L — AB (ref 14–54)
AST: 384 U/L — AB (ref 15–41)
Albumin: 1.9 g/dL — ABNORMAL LOW (ref 3.5–5.0)
Alkaline Phosphatase: 485 U/L — ABNORMAL HIGH (ref 38–126)
Anion gap: 7 (ref 5–15)
BUN: 12 mg/dL (ref 6–20)
CALCIUM: 8.6 mg/dL — AB (ref 8.9–10.3)
CO2: 24 mmol/L (ref 22–32)
Chloride: 106 mmol/L (ref 101–111)
Creatinine, Ser: 0.73 mg/dL (ref 0.44–1.00)
GFR calc Af Amer: >60 mL/min (ref >60)
GFR calc non Af Amer: >60 mL/min (ref >60)
Glucose, Bld: 182 mg/dL — ABNORMAL HIGH (ref 65–99)
POTASSIUM: 4.4 mmol/L (ref 3.5–5.1)
SODIUM: 137 mmol/L (ref 135–145)
TOTAL PROTEIN: 5.8 g/dL — AB (ref 6.5–8.1)
Total Bilirubin: 12.4 mg/dL — ABNORMAL HIGH (ref 0.3–1.2)

## 2017-09-13 LAB — PREPARE RBC (CROSSMATCH)

## 2017-09-13 LAB — HEMOGLOBIN AND HEMATOCRIT, BLOOD
HEMATOCRIT: 30.5 % — AB (ref 36.0–46.0)
Hemoglobin: 10 g/dL — ABNORMAL LOW (ref 12.0–15.0)

## 2017-09-13 LAB — GLUCOSE, CAPILLARY
Glucose-Capillary: 177 mg/dL — ABNORMAL HIGH (ref 65–99)
Glucose-Capillary: 214 mg/dL — ABNORMAL HIGH (ref 65–99)
Glucose-Capillary: 226 mg/dL — ABNORMAL HIGH (ref 65–99)
Glucose-Capillary: 227 mg/dL — ABNORMAL HIGH (ref 65–99)
Glucose-Capillary: 143 mg/dL — ABNORMAL HIGH (ref 65–99)
Glucose-Capillary: 145 mg/dL — ABNORMAL HIGH (ref 65–99)

## 2017-09-13 LAB — BPAM FFP
BLOOD PRODUCT EXPIRATION DATE: 201904282359
Blood Product Expiration Date: 201904292359
ISSUE DATE / TIME: 201904251710
UNIT TYPE AND RH: 5100
Unit Type and Rh: 7300

## 2017-09-13 LAB — MAGNESIUM: MAGNESIUM: 2.2 mg/dL (ref 1.7–2.4)

## 2017-09-13 LAB — PROTIME-INR
INR: 2.06
PROTHROMBIN TIME: 23 s — AB (ref 11.4–15.2)

## 2017-09-13 LAB — PHOSPHORUS: PHOSPHORUS: 1.5 mg/dL — AB (ref 2.5–4.6)

## 2017-09-13 MED ORDER — SODIUM CHLORIDE 0.9 % IV SOLN
Freq: Once | INTRAVENOUS | Status: AC
  Administered 2017-09-13: 12:00:00 via INTRAVENOUS

## 2017-09-13 MED ORDER — DEXTROSE 70 % IV SOLN
INTRAVENOUS | Status: AC
  Administered 2017-09-13: 17:00:00 via INTRAVENOUS
  Filled 2017-09-13: qty 588.8

## 2017-09-13 MED ORDER — POTASSIUM PHOSPHATES 15 MMOLE/5ML IV SOLN: 40.0000 meq | Freq: Once | INTRAVENOUS | Status: DC

## 2017-09-13 MED ORDER — SODIUM CHLORIDE 0.9 % IV SOLN
Freq: Once | INTRAVENOUS | Status: AC
  Administered 2017-09-13: 20:00:00 via INTRAVENOUS

## 2017-09-13 MED ORDER — SODIUM GLYCEROPHOSPHATE 1 MMOLE/ML IV SOLN
20.0000 mmol | Freq: Once | INTRAVENOUS | Status: AC
  Administered 2017-09-13: 20 mmol via INTRAVENOUS
  Filled 2017-09-13: qty 20

## 2017-09-13 NOTE — Progress Notes (Signed)
PULMONARY / CRITICAL CARE MEDICINE   Name: Pam Fuller MRN: [id number] DOB: [birth year]    ADMISSION DATE:  09/11/2017 CONSULTATION DATE:  4/11  REFERRING MD:  Dr. Maudie Mercury  CHIEF COMPLAINT:  Aspiration  BRIEF SUMMARY:   Pam Fuller is a 84 year old female with ESRD on MWF HD, HTN, Anemia, and hx of breast cancer who presented to the ED on 4/4 with RLQ abdominal pain. She was found to have acute cholecystitis with a gallstone lodged in the gallbladder neck. CBD measured up to 10 mm. She underwent laparoscopic cholecystectomy on 4/6. She initially did well but on 4/8 was noted to have worsened pain and confusion and thought to have post-op ileus. CT abd/pelvis 4/8 showed small-mod fluid in the abdominal pelvis, read as normal post op changes but could not exclude a bile leak. A hepatobiliary scan was obtained on 4/9 which did suggest a bile leak. Surgery recommended IR drain and GI consult for ERCP. She went to the Endoscopy unit on 4/11 AM for ERCP however during prep with anesthesia she had significant bilious emesis with hypotension requiring intubation and pressor support. ERCP was not attempted. She was subsequently transferred to the medical ICU.   STUDIES:  CT abd/pelv 4/4 >> acute cholecystitis, gallstone in GB neck, CBD 10 mm  CT Head: no acute changes, chronic small vessel ischemic changes  CT abd/pelvis 4/8 >> small-mod fluid in abd/pelvis  Hepatobiliary scan 4/9 >> bile leak  TTE 4/12 >> EF 65-70%, G1DD, narrow LVOT, mild TR, mild pHTN  U/S Abdomen 4/14 >> small fluid near cholecystectomy site  Hepatobiliary scan 4/14 >> Rt subhepatic fluid collection consistent w/ biloma; increased in size from prior  CT abd/pelvis w/ oral contrast 4/16 >> drainage catheter at inferior liver margin with decreased perihepatic fluid; low-density fluid at left subdiaphragmatic area, questionable colonic wall thickening throughout, multifocal airspace disease of lung bases  CT  abdomen.pelvis 4/21: interval growth of large perihepatic and perisplenic fluid collection with multiple, new intraperitoneal fluid collections. Resolving multifocal pneumonia.    CULTURES: Tracheal aspirate 4/11 >> no growth Abd fluid 4/11 >> no growth 5 days Abd fluid 4/15 >> no growth 3 days  ANTIBIOTICS: Zosyn 4/3 >> 4/7 Unasyn 4/11 Zosyn 4/12 - 4/19 Zosyn 4/21-  LINES/TUBES: ETT 4/11 OGT 4/11 PIV RUE 4/9 RLQ Biliary Drain 4/11 HD Cath LIJ 4/12 >> 4/19 L femoral trialysis catheter 4/21> RIJ cath 4/23>   SIGNIFICANT EVENTS: 4/4 - admitted w/ acute cholecystitis 4/6 - lap chole 4/9 - bile leak seen on hepatobiliary scan 4/11 - Aspirated during prep for ERCP, intubated and transferred to ICU 4/11 - Rt abd fluid drain placed by IR 4/12- Heparin gtt started for NSTEMI; held after OG bleeding and trop peak 4/13 - worsening liver enzyymes. ESRD - Anuric. CRRT started. On levophed 1mcg and fent 175mcg 4/14 - Hgb 6.0, transfused 2 units PRBCs 4/17 - DVT LUE - started bivalirudin for suspected HIT 4/18 - CVVH d/c'ed, return to intermittent HD.  4/19 - Restarted HD. HIT negative  4/20 - Bleeding from GB drain, OGT, and stools. Received 1u pRBC. Heparin gtt held. 4/21 - Multiple, new fluid collections found on CT abd/pelvis   4/23 - ERCP with cystic duct bile leak s/p stent placement, erosive gastritis, and CBD dilation  4/24 - Mid-abdomen perc drain placed by IR  4/26 - Coffee-ground emesis after restarting tube feeds   SUBJECTIVE/OVERNIGHT/INTERVAL HX -  BP improving. Now off vasopressin and on levo. Tube feeds increased  yesterday to 20 and significant coffee ground emesis this AM during rounds.    VITAL SIGNS: BP (!) 106/51   Pulse 91   Temp 98.2 F (36.8 C) (Oral)   Resp (!) 22   Ht 5\' 2"  (1.575 m)   Wt 145 lb 4.5 oz (65.9 kg)   SpO2 98%   BMI 26.57 kg/m   HEMODYNAMICS:    VENTILATOR SETTINGS: Vent Mode: PRVC FiO2 (%):  [30 %] 30 % Set Rate:  [20 bmp] 20  bmp Vt Set:  [400 mL] 400 mL PEEP:  [5 cmH20] 5 cmH20 Plateau Pressure:  [11 cmH20-25 cmH20] 25 cmH20  INTAKE / OUTPUT: I/O last 3 completed shifts: In: 3536.9 [I.V.:2788.1; Other:20; NG/GT:428.8; IV Piggyback:300] Out: 9381 [Drains:415; Other:3270]  PHYSICAL EXAMINATION:  General: patient is alert and intermittently follows commands  HEENT: NCAT, scleral icterus, ET tube in place, RIJ cath in place with dressing c/d/i Cardiac:  RRR, no mrg  Pulm: decreased breath sounds at bases otherwise clear, no wheezing  Abd: soft, non-distended, decreased bowel sounds  Ext: LUE edema L>>R unchanged from yesterday, L AVF with palpable thrill, femoral trialysis cathter in place with dressing c/d/i  Neuro:  Pupils equal and symmetric, alert, following commands intermittently, moves all 4 extremities spontaneously but no purposefully     PULMONARY Recent Labs  Lab 09/08/17 0945 09/09/17 0952 09/15/2017 1840  PHART 7.253* 7.330* 7.335*  PCO2ART 25.9* 28.4* 42.6  PO2ART 166* 118.0* 95.0  HCO3 11.1* 15.1* 22.1  TCO2  --  16*  --   O2SAT 98.6 99.0 97.5    CBC Recent Labs  Lab 09/11/17 1515 09/12/17 0421 09/13/17 0325  HGB 9.3* 8.9* 8.6*  HCT 28.5* 26.8* 26.2*  WBC 15.0* 13.3* 16.9*  PLT 57* 49* 41*    COAGULATION Recent Labs  Lab 08/26/2017 1551 09/13/2017 2300 09/11/17 0450 09/12/17 0421 09/13/17 0325  INR 2.16 1.80 1.77 2.27 2.06    CARDIAC   Recent Labs  Lab 09/09/17 1011  TROPONINI 0.49*   No results for input(s): PROBNP in the last 168 hours.   CHEMISTRY Recent Labs  Lab 09/11/17 0450 09/11/17 1515 09/11/17 1520 09/12/17 0421 09/12/17 1602 09/13/17 0325  NA 133* 135  --  136 134* 137  K 3.4* 4.1  --  4.1 4.9 4.4  CL 98* 100*  --  104 102 106  CO2 24 26  --  22 20* 24  GLUCOSE 162* 163*  --  147* 170* 182*  BUN 8 8  --  11 12 12   CREATININE 0.90 0.85  --  1.02* 0.92 0.73  CALCIUM 8.2* 8.0*  --  8.3* 8.4* 8.6*  MG 2.3  --  2.2 2.1 2.3 2.2  PHOS 1.6* 2.2*  2.2* 2.7 2.9 1.5*   Estimated Creatinine Clearance: 48.3 mL/min (by C-G formula based on SCr of 0.73 mg/dL).  I/O last 3 completed shifts: In: 3536.9 [I.V.:2788.1; Other:20; NG/GT:428.8; IV Piggyback:300] Out: 8299 [Drains:415; Other:3270]   Intake/Output Summary (Last 24 hours) at 09/13/2017 1053 Last data filed at 09/13/2017 1000 Gross per 24 hour  Intake 2256.72 ml  Output 3202 ml  Net -945.28 ml     LIVER Recent Labs  Lab 09/09/17 0400  08/20/2017 0435  09/06/2017 1551 08/22/2017 2300 09/11/17 0450 09/11/17 1515 09/12/17 0421 09/12/17 1602 09/13/17 0325  AST 2,141*  --  3,665*  --   --   --  1,527*  --  723*  --  384*  ALT 613*  --  819*  --   --   --  509*  --  359*  --  275*  ALKPHOS 484*  --  515*  --   --   --  461*  --  481*  --  485*  BILITOT 5.3*  --  8.6*  --   --   --  9.8*  --  12.0*  --  12.4*  PROT 5.1*  --  5.7*  --   --   --  6.1*  --  5.6*  --  5.8*  ALBUMIN 1.7*   < > 2.3*  --  2.4*  --  2.5* 2.3* 2.0* 2.0* 1.9*  INR  --    < > 2.38   < > 2.16 1.80 1.77  --  2.27  --  2.06   < > = values in this interval not displayed.     INFECTIOUS Recent Labs  Lab 09/08/17 1552 09/04/2017 1906 09/11/17 0451  LATICACIDVEN 10.8* 5.5* 3.0*     ENDOCRINE CBG (last 3)  Recent Labs    09/12/17 2310 09/13/17 0323 09/13/17 0815  GLUCAP 172* 177* 214*     IMAGING x48h  - image(s) personally visualized  -   highlighted in bold Dg Chest Port 1 View  Result Date: 09/13/2017 CLINICAL DATA:  Airway intubation. EXAM: PORTABLE CHEST 1 VIEW COMPARISON:  09/12/2017. FINDINGS: ETT 2.8 cm above carina. Nasogastric tube is in the stomach. Low lung volumes. Cardiomegaly. Perihilar opacities appear improved. IMPRESSION: Support tubes and apparatus are stable. Slight improvement aeration. Electronically Signed   By: Staci Righter M.D.   On: 09/13/2017 09:00   Dg Chest Port 1 View  Result Date: 09/12/2017 CLINICAL DATA:  Intubation. EXAM: PORTABLE CHEST 1 VIEW COMPARISON:   09/11/2017. FINDINGS: Endotracheal tube, NG tube, right IJ line noted stable position. Heart size normal. Bibasilar atelectasis/infiltrates. Small bilateral pleural effusion scratched it no pleural effusion or pneumothorax. Pigtail catheter is noted over the upper abdomen bilaterally. IMPRESSION: 1. Chest lines and tubes in stable position. Pigtail catheters noted in the upper abdomen abdomen. 2.  Bibasilar atelectasis and/or infiltrates. Electronically Signed   By: Marcello Moores  Register   On: 09/12/2017 09:41     DISCUSSION: Pam Fuller is a 84 year old female with ESRD on MWF HD, HTN, Anemia, and hx of breast cancer who was admitted 4/4 with acute cholecystitis. S/p lap chole 4/6 complicated by post-op bile leak. Went for ERCP 4/11 however developed bilious emesis during prep, hypotension, and required intubation plus pressor support. Transferred to the ICU on 4/11.  ASSESSMENT / PLAN: PULMONARY A: Acute Respiratory Distress 2/2 Aspiration Requiring Mechanical Ventilation since 4/11  P:   - Continue vent support for now. Failed attempt to wean x3. Will speak to family about tracheostomy.  - VAP protocol   CARDIOVASCULAR A:  #baseline Aortic Atherosclerosis on CXR RBBB - present prior to admission #current Septic Shock 2/2 biliary peritonitis   Troponin elevation (peak 4.47) 2/2 demand ischemia in setting of hypotensive event - resolved  Echocardiogram w/ EF 65-70% Prolonged QTc LUE DVT - heparin started 4/19 and stopped 4/21 P:  - Off vasopressin. Will continue levophed - Stopping stress dose steroids  - MAP goal > 65 - Heparin stopped in the setting of acute blood loss 4/21   RENAL  A:   ESRD on MWF HD AVF LUE P:   - CRRT restarted on 4/21  - Nephrology following  - Follow up output and monitor electrolytes  - Replace electrolytes as indicated  GASTROINTESTINAL A:   Acute  Cholecystitis s/p lap chole 4/6 Bile leak/biliary peritonitis s/p IR Rt abdominal fluid drain  4/11 - biloma by HIDA 4/14 Shock Liver Multiple new intraperitoneal abscesses/biloma found on 4/21 Severe protein calorie malnutrition  Erosive gastritis seen on ERCP 4/23  Coffee-ground emesis 4/26 SUP P:   - INR 2.0 and Plt 41, giving 2u FFP in the setting of coffee ground emesis  - S/p mid-abdomen drain placed by IR 4/24  - Zosyn 4/21>  - s/p ERCP 4/23 with stent placement that will need removal in 6-8 weeks   - Right abdominal fluid drain in place with minimal drainage, being flush q8h  - Appreciate surgery assistance. GI signed off 4/24.  - Continue to monitor transaminases, improving  - TPN started 4/23. Will continue for now as patient unable to tolerate tube feeds  - Continue Protonix as ordered  HEMATOLOGIC A:   Hx of Breast Cancer - no acute issue Acute on chronic anemia (AOCD) - requiring multiple transfusions  Leukocytosis  Thrombocytopenia - Plt 41 LUE (IJV) DVT Acute blood loss anemia requiring multiple transfusions  P:  - Repeating CBC stat in the setting of coffee ground emesis  - Consider repeat CT abdomen/pelvis if WBC continues to increase  - Hgb goal > 7 - Heparin stopped 4/20 - CBC daily  - Bivalirudin stopped 4/20, heparin initiated after HIT antibody was negative  INFECTIOUS A:   Septic Shock from Aspiration PNA - resolved  Bile Leak s/p IR Biliary Drain 4/11 Septic shock 2/2 new intraperitoneal abscesses/biloma Leukocytosis WBC 49 4/23 - likely leukemoid reaction, WBC 13 today  P:   - Zosyn 4/12 >> 4/19. Restarted Zosyn 4/21 - S/p ERCP with stent placement  - S/p IR with mid abdomen drain placement  - Follow up new drain output culture - no organisms seen   ENDOCRINE A:   Secondary Hyperparathyroidism Hypoglycemia - likely 2/2 shock liver. Resolved P:   - TPN started 4/23 - D/c tube feeds in the setting of coffee ground emesis   - CBG monitoring q4h  - CBG goal < 180  NEUROLOGIC A:   Pain/Agitation Anisocoria  P:   - Fentanyl gtt,   - RASS goal: 0 to -1   - Fentanyl and Versed as needed   FAMILY  - Updates: Sons updated at bedside 4/24.    Welford Roche, MD  Internal Medicine PGY-1  P [phone]

## 2017-09-13 NOTE — Progress Notes (Addendum)
[facility] Progress Note  3 Days Post-Op  Subjective: CC: sepsis Underwernt successful ERCP/stent 4/23 for cystic stump leak- T bili continues to trend up Weaning off levo, also on vasopressin Patient remains on the ventilator. Following commands and tracking on wake up assessment CT 4/21 showed growth of perihepatic and perisplenic fluid collections - Additional biloma drain placed 4/24 with significant bilious output  Objective: Vital signs in last 24 hours: Temp:  [94 F (34.4 C)-97.4 F (36.3 C)] 96.1 F (35.6 C) (04/25 2300) Pulse Rate:  [78-103] 96 (04/26 0711) Resp:  [17-26] 22 (04/26 0711) BP: (90-169)/(47-155) 110/54 (04/26 0711) SpO2:  [91 %-100 %] 99 % (04/26 0711) FiO2 (%):  [30 %] 30 % (04/26 0711) Weight:  [65.9 kg (145 lb 4.5 oz)] 65.9 kg (145 lb 4.5 oz) (04/26 0200) Last BM Date: 09/11/17  Intake/Output from previous day: 04/25 0701 - 04/26 0700 In: 2339.6 [I.V.:1815.1; NG/GT:304.5; IV Piggyback:200] Out: 2513 [Drains:265] Intake/Output this shift: No intake/output data recorded.  PE: Gen: on the vent, somnolent Card:  Regular rate and irregular rhythm, pedal pulses 2+ BL Pulm:  Slightly tachypneic, inspiratory crackles in bases bilaterally  Abd: Soft, distended, incisions C/D/I, drain x 2 in RUQ with bloody/bilious output Skin: warm and dry, no rashes   Lab Results:  Recent Labs    09/12/17 0421 09/13/17 0325  WBC 13.3* 16.9*  HGB 8.9* 8.6*  HCT 26.8* 26.2*  PLT 49* 41*   BMET Recent Labs    09/12/17 1602 09/13/17 0325  NA 134* 137  K 4.9 4.4  CL 102 106  CO2 20* 24  GLUCOSE 170* 182*  BUN 12 12  CREATININE 0.92 0.73  CALCIUM 8.4* 8.6*   PT/INR Recent Labs    09/12/17 0421 09/13/17 0325  LABPROT 24.8* 23.0*  INR 2.27 2.06   CMP     Component Value Date/Time   NA 137 09/13/2017 0325   NA 142 02/12/2017 1400   K 4.4 09/13/2017 0325   K 3.9 02/12/2017 1400   CL 106 09/13/2017 0325   CO2 24 09/13/2017 0325    CO2 30 (H) 02/12/2017 1400   GLUCOSE 182 (H) 09/13/2017 0325   GLUCOSE 88 02/12/2017 1400   BUN 12 09/13/2017 0325   BUN 16.9 02/12/2017 1400   CREATININE 0.73 09/13/2017 0325   CREATININE 6.2 (HH) 02/12/2017 1400   CALCIUM 8.6 (L) 09/13/2017 0325   CALCIUM 9.2 02/12/2017 1400   PROT 5.8 (L) 09/13/2017 0325   PROT 6.8 02/12/2017 1400   ALBUMIN 1.9 (L) 09/13/2017 0325   ALBUMIN 3.3 (L) 02/12/2017 1400   AST 384 (H) 09/13/2017 0325   AST 13 02/12/2017 1400   ALT 275 (H) 09/13/2017 0325   ALT 7 02/12/2017 1400   ALKPHOS 485 (H) 09/13/2017 0325   ALKPHOS 75 02/12/2017 1400   BILITOT 12.4 (H) 09/13/2017 0325   BILITOT 0.68 02/12/2017 1400   GFRNONAA >60 09/13/2017 0325   GFRAA >60 09/13/2017 0325   Lipase     Component Value Date/Time   LIPASE 36 08/31/2017 1618       Studies/Results: Dg Chest Port 1 View  Result Date: 09/12/2017 CLINICAL DATA:  Intubation. EXAM: PORTABLE CHEST 1 VIEW COMPARISON:  09/11/2017. FINDINGS: Endotracheal tube, NG tube, right IJ line noted stable position. Heart size normal. Bibasilar atelectasis/infiltrates. Small bilateral pleural effusion scratched it no pleural effusion or pneumothorax. Pigtail catheter is noted over the upper abdomen bilaterally. IMPRESSION: 1. Chest lines and tubes in stable position. Pigtail catheters noted  in the upper abdomen abdomen. 2.  Bibasilar atelectasis and/or infiltrates. Electronically Signed   By: Marcello Moores  Register   On: 09/12/2017 09:41   Ir Image Guided Drainage Percut Cath  Peritoneal Retroperit  Result Date: 09/11/2017 INDICATION: Biloma.  Bile leak. EXAM: ABDOMINAL DRAIN PLACEMENT MEDICATIONS: The patient is currently admitted to the hospital and receiving intravenous antibiotics. The antibiotics were administered within an appropriate time frame prior to the initiation of the procedure. ANESTHESIA/SEDATION: Intubated COMPLICATIONS: None immediate. PROCEDURE: Informed written consent was obtained from the patient  after a thorough discussion of the procedural risks, benefits and alternatives. All questions were addressed. Maximal Sterile Barrier Technique was utilized including caps, mask, sterile gowns, sterile gloves, sterile drape, hand hygiene and skin antiseptic. A timeout was performed prior to the initiation of the procedure. The epigastric region was prepped and draped in a sterile fashion. 1% lidocaine was utilized for local anesthesia. Under sonographic guidance, a micropuncture needle was inserted into the epigastric biloma. This was removed over a 018 wire which was up sized to an Amplatz. Ten [place] dilator followed by a 10 [place] drain were inserted. It was looped and string fixed. Bilious fluid was aspirated. FINDINGS: Images document 31 French drain placement into an upper abdominal biloma. IMPRESSION: Successful 10 French biloma drain placement. Electronically Signed   By: Marybelle Killings M.D.   On: 09/11/2017 12:59    Anti-infectives: Anti-infectives (From admission, onward)   Start     Dose/Rate Route Frequency Ordered Stop   09/08/17 2000  piperacillin-tazobactam (ZOSYN) IVPB 3.375 g     3.375 g 100 mL/hr over 30 Minutes Intravenous Every 6 hours 09/08/17 1946     09/05/17 2000  piperacillin-tazobactam (ZOSYN) IVPB 3.375 g     3.375 g 12.5 mL/hr over 240 Minutes Intravenous Every 12 hours 09/05/17 1126 09/07/17 0154   08/31/17 0800  piperacillin-tazobactam (ZOSYN) IVPB 3.375 g  Status:  Discontinued     3.375 g 100 mL/hr over 30 Minutes Intravenous Every 6 hours 08/31/17 0739 09/05/17 1126   08/30/17 2000  Ampicillin-Sulbactam (UNASYN) 3 g in sodium chloride 0.9 % 100 mL IVPB  Status:  Discontinued     3 g 200 mL/hr over 30 Minutes Intravenous Every 24 hours 08/20/2017 0953 08/30/17 0945   08/30/17 1230  piperacillin-tazobactam (ZOSYN) IVPB 3.375 g  Status:  Discontinued     3.375 g 12.5 mL/hr over 240 Minutes Intravenous Every 12 hours 08/30/17 1134 08/31/17 0738   09/07/2017 1030   Ampicillin-Sulbactam (UNASYN) 3 g in sodium chloride 0.9 % 100 mL IVPB     3 g 200 mL/hr over 30 Minutes Intravenous  Once 09/03/2017 0953 09/05/2017 1246   08/23/2017 0000  ampicillin-sulbactam (UNASYN) 1.5 g in sodium chloride 0.9 % 100 mL IVPB  Status:  Discontinued     1.5 g 200 mL/hr over 30 Minutes Intravenous Once 08/28/17 1045 08/27/2017 0928   09/05/2017 2200  piperacillin-tazobactam (ZOSYN) IVPB 3.375 g    Note to Pharmacy:  Zosyn 3.375 g IV q12h in ESRD on HD   3.375 g 12.5 mL/hr over 240 Minutes Intravenous Every 12 hours 09/06/2017 1155 08/25/17 1335   08/22/17 1000  piperacillin-tazobactam (ZOSYN) IVPB 3.375 g  Status:  Discontinued    Note to Pharmacy:  Zosyn 3.375 g IV q12h in ESRD on HD   3.375 g 12.5 mL/hr over 240 Minutes Intravenous Every 12 hours 08/22/17 0128 09/12/2017 1155   08/22/17 0045  piperacillin-tazobactam (ZOSYN) IVPB 3.375 g     3.375 g  12.5 mL/hr over 240 Minutes Intravenous  Once 08/22/17 0038 08/22/17 0145       Assessment/Plan HTN ESRD - HD MWF H/o breast cancer  Acute cholecystitis POD16 s/p lap chole4/6/19 by Dr. Redmond Pulling - CT scan 4/8 w/ small amount fluid around liver and layering in pelvis ; HIDA 4/9 positive for bile leak - CT 4/21 with multiple fluid collections - additional drain placed 4/24 with good output -ERCP 4/23 w stent placement (Dr Fuller Plan) Shock liver -Total bilirubin uptrending, AST/ALT down-trending today  VDRF secondary to aspiration/PNA -CCMfollowing - appreciate assistance.   Leukocytosis Back up to 16.9 today Likely secondary to aspiration PNA and bile leak  On Zosyn.  ABL Anemia + chronic disease - Continues to be coagulopathic in setting of ESRD/ shock liver  Lactic acidosis - improved on bicarb drip and CVVH, appreciate CCM assistance  ID -zosyn 4/4>>4/7,Unasyn 4/11-->4/12, Zosyn 4/12 --> FEN -NPO/OGT-ok to try trickle feeds from surgery standpoint  VTE -SCDs Foley -none, anuric Follow up -Dr.  Redmond Pulling    LOS: 22 days    Brigid Re , [facility] 09/13/2017, 7:42 AM Pager: [phone] Consults: [phone] Mon-Fri 7:00 am-4:30 pm Sat-Sun 7:00 am-11:30 am

## 2017-09-13 NOTE — Progress Notes (Signed)
RT attempted to wean pt on PS 10/5 and PS 5/5. PT had high RRs and low vts with increased agitation. RT placed pt back on previous settings after trying various levels of PS. Pt more relaxed and VS stable after placing back on PRVC settings. RN aware.

## 2017-09-13 NOTE — Progress Notes (Signed)
PHARMACY - ADULT TOTAL PARENTERAL NUTRITION CONSULT NOTE   Pharmacy Consult:  TPN Indication: Intolerance to enteral nutrition  Patient Measurements: Height: 5\' 2"  (157.5 cm) Weight: 145 lb 4.5 oz (65.9 kg) IBW/kg (Calculated) : 50.1 TPN AdjBW (KG): 61.7 Body mass index is 26.57 kg/m.  Assessment:  83 YOF presented on 09/07/2017 with abdominal pain and vomiting, found to have cholecystitis and she underwent cholecystomy on 09/13/2017.  She was on a clear liquid diet before and after surgery, but did not tolerate.  Imaging showed bile leak with fluid collection around the liver, and patient aspirated prior to planned ERCP on.  She was started on TF on 09/05/17 but did not tolerate and retried on 09/06/17 without success.  Pharmacy consulted to initiate TPN for nutritional support.  Patient has moderate malnutrition and is at risk for refeeding syndrome.  GI: Has biloma drain in place with output down significantly to 265 cc/24hr, no NG output. Trickle feeds are currently at 17ml/hr, but having trouble tolerating. This has been a pattern for the patient. Per CCM team, will not titrate up and does not seem to be tolerating on 4/26, may need to hold. Albumin remains low at 1.9. Prealbumin on 4/22 was < 5. Last BM was 4/24. Endo: hypoglycemic prior to TPN, CBGs trending up (130-180s), now on stress dose steroids Insulin requirements in the past 24 hours: 10 units Lytes: wnl today exc Phos low at 1.5. CoCa 10.2.  Renal: ESRD on MWF HD now on CRRT due to pressor support.  Pulm: intubated - FiO2 40% Cards: HTN, s/p NSTEMI.  MAP 50-60s, on Levophed + VP AC: S/p heparin for LUE DVT. Hgn low at 8.6, plts low at 41. Has been receiving some Vit K for INR. Hepatobil: Pt in shock liver, LFTs generally downtrending, Tbili still elevated and up to 12.4. INR 2.06. Trig wnl at 105.  s/p ERCP 4/23 and CBD stent Neuro: sedated GCS 13, RASS -1, CPOT 0. On fentanyl  ID: Continues on Zosyn for PNA/bile leak. Afebrile, WBC  mostly stable at 16.9.  TPN Access: R IJ placed 4/23 TPN start date: 09/08/17  Nutritional Goals (per RD rec on 09/12/17): KCal: 1250-1500 Protein: 110-122 g  Fluid: 1.3 L/day  Current Nutrition:  NPO Vital HP at 24ml/hr (21 g of protein, 27 g of dextrose, 5.6 g of lipids, and 240 kcal) TPN (88 g of protein, 180 g of dextrose, 15.4 g of lipid, which provides 1,120 kcal)  Plan:  Continue Vital HP at 62ml/hr. Titrate per MD, may have to hold if not tolerating Continue TPN at 25ml/hr  TPN and Vital HP providers 109 g of protein, 207 g of dextrose, 21 g of lipids for a total of 1,360 kcal, meeting 100% of patient needs Electrolytes in TPN: standard concentration exc increase Phos; Cl:Ac 1:2 Add MVI, zinc 5mg , chromium 53mcg, selenium 69mcg Stop Vitamin C Continue sensitive SSI and adjust as needed Monitor TPN labs, renal function panel daily F/U ability to tolerate TFs, may need to increase TPN slightly on 4/27 if tube feeds have to be stopped  Give sodium glycerophosphate 79mmol IV x 1 (also increasing Phos in next TPN bag)   Elenor Quinones J 09/13/2017. 7:31 AM

## 2017-09-13 NOTE — Progress Notes (Signed)
Assessment/Plan: 1 Acute cholecystitis with post op complications - s/p lap chole 4/6. Bile duct leak sp IR biliary drain 09/08/2017 for IR4/24interventionwith drain forenlarging intraabdominal collection andERCPbiliary stent 4/23 2 Acute resp failure/ asp PNA -VDRF 3 Septic shock - 4 ESRD - on CRRT due to hypotension, on TNA, will keep even; cont current CRRT RX-stable, RX phos 5 Anemia of CKD-Hgb low. S/P prbc's 6Volume - keeping even on CRRT 7LUE DVT - acute L IJ DVT, Cath dc'd  Subjective: Interval History: Tolerating CRRT  Objective: Vital signs in last 24 hours: Temp:  [94 F (34.4 C)-98.2 F (36.8 C)] 98.2 F (36.8 C) (04/26 0816) Pulse Rate:  [78-103] 91 (04/26 1000) Resp:  [17-26] 22 (04/26 1000) BP: (90-169)/(47-155) 106/51 (04/26 1000) SpO2:  [91 %-100 %] 98 % (04/26 1000) FiO2 (%):  [30 %] 30 % (04/26 0711) Weight:  [65.9 kg (145 lb 4.5 oz)] 65.9 kg (145 lb 4.5 oz) (04/26 0200) Weight change: 1.4 kg (3 lb 1.4 oz)  Intake/Output from previous day: 04/25 0701 - 04/26 0700 In: 2339.6 [I.V.:1815.1; NG/GT:304.5; IV Piggyback:200] Out: 2612 [Drains:265] Intake/Output this shift: Total I/O In: 230.3 [I.V.:200.3; NG/GT:30] Out: 936 [Emesis/NG output:550; Drains:100; Other:286]  General appearance: eyes open  a little lighter on sedation   Lab Results: Recent Labs    09/12/17 0421 09/13/17 0325  WBC 13.3* 16.9*  HGB 8.9* 8.6*  HCT 26.8* 26.2*  PLT 49* 41*   BMET:  Recent Labs    09/12/17 1602 09/13/17 0325  NA 134* 137  K 4.9 4.4  CL 102 106  CO2 20* 24  GLUCOSE 170* 182*  BUN 12 12  CREATININE 0.92 0.73  CALCIUM 8.4* 8.6*   No results for input(s): PTH in the last 72 hours. Iron Studies: No results for input(s): IRON, TIBC, TRANSFERRIN, FERRITIN in the last 72 hours. Studies/Results: Dg Chest Port 1 View  Result Date: 09/13/2017 CLINICAL DATA:  Airway intubation. EXAM: PORTABLE CHEST 1 VIEW COMPARISON:  09/12/2017. FINDINGS: ETT 2.8  cm above carina. Nasogastric tube is in the stomach. Low lung volumes. Cardiomegaly. Perihilar opacities appear improved. IMPRESSION: Support tubes and apparatus are stable. Slight improvement aeration. Electronically Signed   By: Staci Righter M.D.   On: 09/13/2017 09:00   Dg Chest Port 1 View  Result Date: 09/12/2017 CLINICAL DATA:  Intubation. EXAM: PORTABLE CHEST 1 VIEW COMPARISON:  09/11/2017. FINDINGS: Endotracheal tube, NG tube, right IJ line noted stable position. Heart size normal. Bibasilar atelectasis/infiltrates. Small bilateral pleural effusion scratched it no pleural effusion or pneumothorax. Pigtail catheter is noted over the upper abdomen bilaterally. IMPRESSION: 1. Chest lines and tubes in stable position. Pigtail catheters noted in the upper abdomen abdomen. 2.  Bibasilar atelectasis and/or infiltrates. Electronically Signed   By: Marcello Moores  Register   On: 09/12/2017 09:41    Scheduled: . chlorhexidine gluconate (MEDLINE KIT)  15 mL Mouth Rinse BID  . Chlorhexidine Gluconate Cloth  6 each Topical Daily  . darbepoetin (ARANESP) injection - DIALYSIS  150 mcg Intravenous Q Mon-HD  . doxercalciferol  2 mcg Intravenous Q M,W,F-HD  . insulin aspart  0-9 Units Subcutaneous Q4H  . mouth rinse  15 mL Mouth Rinse 10 times per day  . pantoprazole sodium  40 mg Per Tube Daily  . sodium chloride flush  10-40 mL Intracatheter Q12H  . sodium chloride flush  5 mL Intracatheter Q8H     LOS: 22 days   Pam Fuller 09/13/2017,10:38 AM

## 2017-09-14 ENCOUNTER — Inpatient Hospital Stay (HOSPITAL_COMMUNITY): Payer: Medicare Other

## 2017-09-14 DIAGNOSIS — Z7189 Other specified counseling: Secondary | ICD-10-CM

## 2017-09-14 DIAGNOSIS — Z515 Encounter for palliative care: Secondary | ICD-10-CM

## 2017-09-14 DIAGNOSIS — L8990 Pressure ulcer of unspecified site, unspecified stage: Secondary | ICD-10-CM

## 2017-09-14 LAB — MAGNESIUM: MAGNESIUM: 2.6 mg/dL — AB (ref 1.7–2.4)

## 2017-09-14 LAB — GLUCOSE, CAPILLARY
GLUCOSE-CAPILLARY: 141 mg/dL — AB (ref 65–99)
GLUCOSE-CAPILLARY: 145 mg/dL — AB (ref 65–99)
GLUCOSE-CAPILLARY: 148 mg/dL — AB (ref 65–99)
GLUCOSE-CAPILLARY: 162 mg/dL — AB (ref 65–99)
Glucose-Capillary: 159 mg/dL — ABNORMAL HIGH (ref 65–99)
Glucose-Capillary: 179 mg/dL — ABNORMAL HIGH (ref 65–99)
Glucose-Capillary: 137 mg/dL — ABNORMAL HIGH (ref 65–99)

## 2017-09-14 LAB — BPAM RBC
BLOOD PRODUCT EXPIRATION DATE: 201905012359
Blood Product Expiration Date: 201905022359
ISSUE DATE / TIME: 201904241048
ISSUE DATE / TIME: 201904261823
Unit Type and Rh: 9500
Unit Type and Rh: 9500

## 2017-09-14 LAB — PREPARE FRESH FROZEN PLASMA: Unit division: 0

## 2017-09-14 LAB — RENAL FUNCTION PANEL
ANION GAP: 8 (ref 5–15)
Albumin: 1.9 g/dL — ABNORMAL LOW (ref 3.5–5.0)
BUN: 25 mg/dL — ABNORMAL HIGH (ref 6–20)
CO2: 25 mmol/L (ref 22–32)
CREATININE: 0.75 mg/dL (ref 0.44–1.00)
Calcium: 8.6 mg/dL — ABNORMAL LOW (ref 8.9–10.3)
Chloride: 101 mmol/L (ref 101–111)
Glucose, Bld: 160 mg/dL — ABNORMAL HIGH (ref 65–99)
PHOSPHORUS: 2.6 mg/dL (ref 2.5–4.6)
Potassium: 4 mmol/L (ref 3.5–5.1)
Sodium: 134 mmol/L — ABNORMAL LOW (ref 135–145)

## 2017-09-14 LAB — CBC
HCT: 32.5 % — ABNORMAL LOW (ref 36.0–46.0)
HEMOGLOBIN: 10.5 g/dL — AB (ref 12.0–15.0)
MCH: 29.7 pg (ref 26.0–34.0)
MCHC: 32.3 g/dL (ref 30.0–36.0)
MCV: 91.8 fL (ref 78.0–100.0)
Platelets: 89 10*3/uL — ABNORMAL LOW (ref 150–400)
RBC: 3.54 MIL/uL — ABNORMAL LOW (ref 3.87–5.11)
RDW: 17.1 % — ABNORMAL HIGH (ref 11.5–15.5)
WBC: 12.4 10*3/uL — ABNORMAL HIGH (ref 4.0–10.5)

## 2017-09-14 LAB — TYPE AND SCREEN
ABO/RH(D): O NEG
Antibody Screen: NEGATIVE
UNIT DIVISION: 0
Unit division: 0

## 2017-09-14 LAB — BPAM FFP
BLOOD PRODUCT EXPIRATION DATE: 201904292359
Blood Product Expiration Date: 201904292359
ISSUE DATE / TIME: 201904261143
ISSUE DATE / TIME: 201904261143
UNIT TYPE AND RH: 6200
Unit Type and Rh: 5100

## 2017-09-14 LAB — COMPREHENSIVE METABOLIC PANEL
ALK PHOS: 455 U/L — AB (ref 38–126)
ALT: 172 U/L — AB (ref 14–54)
ANION GAP: 10 (ref 5–15)
AST: 216 U/L — ABNORMAL HIGH (ref 15–41)
Albumin: 2.1 g/dL — ABNORMAL LOW (ref 3.5–5.0)
BUN: 24 mg/dL — ABNORMAL HIGH (ref 6–20)
CALCIUM: 9.1 mg/dL (ref 8.9–10.3)
CO2: 27 mmol/L (ref 22–32)
CREATININE: 0.87 mg/dL (ref 0.44–1.00)
Chloride: 101 mmol/L (ref 101–111)
Glucose, Bld: 172 mg/dL — ABNORMAL HIGH (ref 65–99)
Potassium: 4.1 mmol/L (ref 3.5–5.1)
SODIUM: 138 mmol/L (ref 135–145)
Total Bilirubin: 16.8 mg/dL — ABNORMAL HIGH (ref 0.3–1.2)
Total Protein: 6.4 g/dL — ABNORMAL LOW (ref 6.5–8.1)

## 2017-09-14 LAB — BPAM PLATELET PHERESIS
BLOOD PRODUCT EXPIRATION DATE: 201904262359
ISSUE DATE / TIME: 201904261143
UNIT TYPE AND RH: 6200

## 2017-09-14 LAB — PREPARE PLATELET PHERESIS: Unit division: 0

## 2017-09-14 LAB — PROTIME-INR
INR: 1.69
PROTHROMBIN TIME: 19.8 s — AB (ref 11.4–15.2)

## 2017-09-14 LAB — PHOSPHORUS: PHOSPHORUS: 2.8 mg/dL (ref 2.5–4.6)

## 2017-09-14 MED ORDER — ZINC TRACE METAL 1 MG/ML IV SOLN
INTRAVENOUS | Status: AC
  Administered 2017-09-14: 17:00:00 via INTRAVENOUS
  Filled 2017-09-14: qty 760

## 2017-09-14 MED ORDER — FENTANYL CITRATE (PF) 100 MCG/2ML IJ SOLN
200.0000 ug | Freq: Once | INTRAMUSCULAR | Status: AC
  Administered 2017-09-15: 50 ug via INTRAVENOUS

## 2017-09-14 MED ORDER — VECURONIUM BROMIDE 10 MG IV SOLR
10.0000 mg | Freq: Once | INTRAVENOUS | Status: AC
  Administered 2017-09-15: 5 mg via INTRAVENOUS
  Filled 2017-09-14: qty 10

## 2017-09-14 MED ORDER — SODIUM CHLORIDE 0.9 % IV BOLUS
1000.0000 mL | Freq: Once | INTRAVENOUS | Status: AC
  Administered 2017-09-14: 1000 mL via INTRAVENOUS

## 2017-09-14 MED ORDER — ETOMIDATE 2 MG/ML IV SOLN
40.0000 mg | Freq: Once | INTRAVENOUS | Status: AC
  Administered 2017-09-15: 20 mg via INTRAVENOUS
  Filled 2017-09-14: qty 20

## 2017-09-14 MED ORDER — MIDAZOLAM HCL 2 MG/2ML IJ SOLN
4.0000 mg | Freq: Once | INTRAMUSCULAR | Status: AC
  Administered 2017-09-15: 2 mg via INTRAVENOUS
  Filled 2017-09-14: qty 4

## 2017-09-14 MED ORDER — PROPOFOL 500 MG/50ML IV EMUL: 5.0000 ug/kg/min | Freq: Once | INTRAVENOUS | Status: DC

## 2017-09-14 NOTE — Progress Notes (Signed)
PHARMACY - ADULT TOTAL PARENTERAL NUTRITION CONSULT NOTE   Pharmacy Consult:  TPN Indication: Intolerance to enteral nutrition  Patient Measurements: Height: 5\' 2"  (157.5 cm) Weight: 139 lb 15.9 oz (63.5 kg) IBW/kg (Calculated) : 50.1 TPN AdjBW (KG): 61.7 Body mass index is 25.6 kg/m.  Assessment:  20 YOF presented on 08/25/2017 with abdominal pain and vomiting, found to have cholecystitis and she underwent cholecystomy on 08/22/2017.  She was on a clear liquid diet before and after surgery, but did not tolerate.  Imaging showed bile leak with fluid collection around the liver, and patient aspirated prior to planned ERCP on.  She was started on TF on 09/05/17 but did not tolerate and retried on 09/06/17 without success.  Pharmacy consulted to initiate TPN for nutritional support.  Patient has moderate malnutrition and is at risk for refeeding syndrome.  GI: Has biloma drain in place with output 380 cc/24hr, no NG output. Trickle feeds are currently at 76ml/hr, but having trouble tolerating. This has been a pattern for the patient. Per CCM team, stopping TFs on 4/26 and placed NG tube. Had 1,015ml of output yesterday. Albumin remains low at 2.1. Prealbumin on 4/22 was < 5. Last BM was 4/24. Endo: hypoglycemic prior to TPN, CBGs better controlled with new TPN bag (140-170s), on stress dose steroids Insulin requirements in the past 24 hours: 13 units Lytes: wnl today exc Mg up to 2.6. CoCa 10.5.  Renal: ESRD on MWF HD now on CRRT due to pressor support.  Pulm: intubated - FiO2 40% Cards: HTN, s/p NSTEMI.  MAP 50-60s, on Levophed + VP AC: S/p heparin for LUE DVT. Hgn low at 8.6, plts low at 41. Has been receiving some Vit K for INR. Hepatobil: Pt in shock liver, LFTs generally downtrending, Tbili still elevated and up to 16.8. INR 2.06. Trig wnl at 105.  s/p ERCP 4/23 and CBD stent Neuro: sedated GCS 13, RASS -1, CPOT 0. On fentanyl  ID: Continues on Zosyn for PNA/bile leak. All cx's are no growth so  far. Afebrile, WBC trending down to 12.4.  TPN Access: R IJ placed 4/23 TPN start date: 09/08/17  Nutritional Goals (per RD rec on 09/12/17): KCal: 1250-1500 Protein: 110-122 g  Fluid: 1.3 L/day  Current Nutrition:  NPO Vital HP at 41ml/hr (21 g of protein, 27 g of dextrose, 5.6 g of lipids, and 240 kcal) TPN (88 g of protein, 180 g of dextrose, 15.4 g of lipid, which provides 1,120 kcal)  Plan:  Increase TPN to 40ml/hr  TPN provides 114 g of protein, 180 g of dextrose, 30 g of lipids for a total of 1,368 kcal, meeting 100% of patient needs Electrolytes in TPN: standard concentration exc continue higher Phos and lower Mg and Ca; Cl:Ac 1:2 Add MVI, zinc 5mg , chromium 108mcg, selenium 50mcg. Hold other trace elements due to liver injury Continue sensitive SSI and adjust as needed Monitor TPN labs, renal function panel daily F/U ability to trial TFs again   Pam Fuller J 09/14/2017. 7:04 AM

## 2017-09-14 NOTE — Progress Notes (Signed)
Pharmacy Antibiotic Note  Pam Fuller is a 84 y.o. female admitted on 09/17/2017 with sepsis.  Pharmacy has been consulted for Zosyn dosing. CRRT restarted 4/21.   Assessment:  Patient underwent ERCP 4/23, WBC trending down 12.4, Temp afebrile, blood and bile cultures with no growth to date. Patient had another episode of large volume emesis yesterday.   Plan: Zosyn 3.375 grams IV Q6H  Monitor cultures, renal plans, LOT   Height: 5\' 2"  (157.5 cm) Weight: 139 lb 15.9 oz (63.5 kg) IBW/kg (Calculated) : 50.1  Temp (24hrs), Avg:97.6 F (36.4 C), Min:96.5 F (35.8 C), Max:98.4 F (36.9 C)  Recent Labs  Lab 09/08/17 1552  09/02/2017 1906  09/11/17 0451  09/12/17 0421 09/12/17 1602 09/13/17 0325 09/13/17 1123 09/13/17 1650 09/13/17 1654 09/14/17 0428  WBC  --    < >  --    < >  --    < > 13.3*  --  16.9* 17.0*  --  15.1* 12.4*  CREATININE 4.07*   < >  --    < >  --    < > 1.02* 0.92 0.73  --  0.92  --  0.87  LATICACIDVEN 10.8*  --  5.5*  --  3.0*  --   --   --   --   --   --   --   --    < > = values in this interval not displayed.    Estimated Creatinine Clearance: 43.7 mL/min (by C-G formula based on SCr of 0.87 mg/dL).    Allergies  Allergen Reactions  . Erythromycin Nausea And Vomiting  . Vicodin [Hydrocodone-Acetaminophen]     unknown    Antimicrobials this admission: 4/11 Unasyn x1 4/4 Zosyn >> 4/7 4/12 Zosyn >> 4/19 4/21 Zosyn >>    Dose adjustments this admission: 4/13: CRRT initiated > 3.375g IV q6h over 30 min 4/18: CRRT discontinued  4/19: Restarted IHD > 3.375g IV q12h 4/21: CRRT initiated > 3.375g IV q6h over 30 min  Microbiology results: 4/25 Abd fluid >> ngtd  4/15 Bile Cx: Neg 4/11 MRSA: Neg 4/11 Trach aspirate: No growth 4/11 Bile Cx: Neg  Thank you for allowing pharmacy to be a part of this patient's care.  Albertina Parr, PharmD., BCPS Clinical Pharmacist Clinical phone for 09/14/17 until 3:30pm: [phone] If after 3:30pm, please  call main pharmacy at: [phone]

## 2017-09-14 NOTE — Progress Notes (Signed)
Assessment/Plan: 1 Acute cholecystitis with post op complications - s/p lap chole 4/6. Bile duct leak sp IR biliary drain 08/22/2017 for IR4/24interventionwith drain forenlarging intraabdominal collection andERCPbiliary stent 4/23 2 Acute resp failure/ asp PNA -VDRF 3 Septic shock - 4 ESRD - on CRRT due to hypotension, onTNA; cont current CRRT RX-stable- give liter NS 5 Anemia of CKD-Hgb low. S/P prbc's/FFP 6Volume - keeping even on CRRT...CVP 4, so will give volume 7LUE DVT - acute L IJ DVT, Cath dc'd  Subjective: Interval History: Got PRBCs  Objective: Vital signs in last 24 hours: Temp:  [96.5 F (35.8 C)-98.4 F (36.9 C)] 97.9 F (36.6 C) (04/27 0720) Pulse Rate:  [80-109] 94 (04/27 1135) Resp:  [13-29] 29 (04/27 1135) BP: (77-150)/(42-82) 135/72 (04/27 1135) SpO2:  [83 %-100 %] 94 % (04/27 1135) FiO2 (%):  [30 %] 30 % (04/27 1135) Weight:  [63.5 kg (139 lb 15.9 oz)] 63.5 kg (139 lb 15.9 oz) (04/27 0500) Weight change: -2.4 kg (-5 lb 4.7 oz)  Intake/Output from previous day: 04/26 0701 - 04/27 0700 In: 3515.3 [I.V.:1700.3; Blood:1228; NG/GT:90; IV Piggyback:455] Out: 4268 [Emesis/NG output:1050; Drains:380] Intake/Output this shift: Total I/O In: 287.6 [I.V.:227.6; NG/GT:60] Out: 471 [Emesis/NG output:140; Drains:40; Other:291]  General appearance: acutely ill Head: Normocephalic, without obvious abnormality, atraumatic, oral intubation  Eyes open Reportedly following some commands  Lab Results: Recent Labs    09/13/17 1654 09/13/17 2130 09/14/17 0428  WBC 15.1*  --  12.4*  HGB 7.4* 10.0* 10.5*  HCT 23.6* 30.5* 32.5*  PLT 130*  --  89*   BMET:  Recent Labs    09/13/17 1650 09/14/17 0428  NA 138 138  K 4.2 4.1  CL 101 101  CO2 22 27  GLUCOSE 178* 172*  BUN 19 24*  CREATININE 0.92 0.87  CALCIUM 9.1 9.1   No results for input(s): PTH in the last 72 hours. Iron Studies: No results for input(s): IRON, TIBC, TRANSFERRIN, FERRITIN in the last  72 hours. Studies/Results: Dg Chest Port 1 View  Result Date: 09/14/2017 CLINICAL DATA:  84 year old female status post intubation EXAM: PORTABLE CHEST 1 VIEW COMPARISON:  Prior chest x-ray 09/13/2017 FINDINGS: The patient remains intubated. The tip of the endotracheal tube is at the level of the clavicles approximately 7 cm above the carina. A nasogastric tube is present. The tip lies off the field of view, presumably in the stomach. A right IJ central venous catheter remains present. The tip overlies the distal SVC. A pigtail drainage catheter projects over the left upper quadrant. Slightly improved aeration with decreasing atelectasis in the lung bases. Bilateral interstitial airspace opacities persist. Residual linear atelectasis versus scarring in the right middle lobe. No pulmonary edema, pneumothorax or pleural effusion. No new airspace opacity. IMPRESSION: 1. The tip of the endotracheal tube is now 7 cm above the carina. 2. Other support apparatus in stable and satisfactory position. 3. Improved aeration overall secondary to decreasing bibasilar atelectasis. 4. No new acute cardiopulmonary process. Electronically Signed   By: Jacqulynn Cadet M.D.   On: 09/14/2017 09:10   Dg Chest Port 1 View  Result Date: 09/13/2017 CLINICAL DATA:  Airway intubation. EXAM: PORTABLE CHEST 1 VIEW COMPARISON:  09/12/2017. FINDINGS: ETT 2.8 cm above carina. Nasogastric tube is in the stomach. Low lung volumes. Cardiomegaly. Perihilar opacities appear improved. IMPRESSION: Support tubes and apparatus are stable. Slight improvement aeration. Electronically Signed   By: Staci Righter M.D.   On: 09/13/2017 09:00    Scheduled: . chlorhexidine gluconate (MEDLINE  KIT)  15 mL Mouth Rinse BID  . Chlorhexidine Gluconate Cloth  6 each Topical Daily  . darbepoetin (ARANESP) injection - DIALYSIS  150 mcg Intravenous Q Mon-HD  . doxercalciferol  2 mcg Intravenous Q M,W,F-HD  . insulin aspart  0-9 Units Subcutaneous Q4H  .  mouth rinse  15 mL Mouth Rinse 10 times per day  . pantoprazole sodium  40 mg Per Tube Daily  . sodium chloride flush  10-40 mL Intracatheter Q12H  . sodium chloride flush  5 mL Intracatheter Q8H     LOS: 23 days   Estanislado Emms 09/14/2017,11:42 AM

## 2017-09-14 NOTE — Progress Notes (Signed)
Son arrived and other son on the phone, extensive discussion regarding plan of care, they claim that they have had no conversations about their mother condition.  I explained that survival for their mother given extent of MODS is under 10% and that she is unlikely to do well with this or make it to a nursing home.  Offered either tracheostomy or withdrawal of care.  They are unsure if patient would want trach/peg but they asked for more time to decide.  I informed them that we donot have ample time as today is day 17 on the ventilator.  They are to inform me later on today and if to proceed with trach, will attempt that tomorrow.  The patient is critically ill with multiple organ systems failure and requires high complexity decision making for assessment and support, frequent evaluation and titration of therapies, application of advanced monitoring technologies and extensive interpretation of multiple databases.   Critical Care Time devoted to patient care services described in this note is  60  Minutes. This time reflects time of care of this signee Dr Jennet Maduro. This critical care time does not reflect procedure time, or teaching time or supervisory time of PA/NP/Med student/Med Resident etc but could involve care discussion time.  Rush Farmer, M.D. [facility] Pulmonary/Critical Care Medicine. Pager: [phone]. After hours pager: [phone].

## 2017-09-14 NOTE — Progress Notes (Signed)
PULMONARY / CRITICAL CARE MEDICINE   Name: Pam Fuller MRN: [id number] DOB: 10-09-1932    ADMISSION DATE:  09/02/2017 CONSULTATION DATE:  4/11  REFERRING MD:  Dr. Maudie Mercury  CHIEF COMPLAINT:  Aspiration  BRIEF SUMMARY:   Pam Fuller is a 84 year old female with ESRD on MWF HD, HTN, Anemia, and hx of breast cancer who presented to the ED on 4/4 with RLQ abdominal pain. She was found to have acute cholecystitis with a gallstone lodged in the gallbladder neck. CBD measured up to 10 mm. She underwent laparoscopic cholecystectomy on 4/6. She initially did well but on 4/8 was noted to have worsened pain and confusion and thought to have post-op ileus. CT abd/pelvis 4/8 showed small-mod fluid in the abdominal pelvis, read as normal post op changes but could not exclude a bile leak. A hepatobiliary scan was obtained on 4/9 which did suggest a bile leak. Surgery recommended IR drain and GI consult for ERCP. She went to the Endoscopy unit on 4/11 AM for ERCP however during prep with anesthesia she had significant bilious emesis with hypotension requiring intubation and pressor support. ERCP was not attempted. She was subsequently transferred to the medical ICU.   STUDIES:  CT abd/pelv 4/4 >> acute cholecystitis, gallstone in GB neck, CBD 10 mm  CT Head: no acute changes, chronic small vessel ischemic changes  CT abd/pelvis 4/8 >> small-mod fluid in abd/pelvis  Hepatobiliary scan 4/9 >> bile leak  TTE 4/12 >> EF 65-70%, G1DD, narrow LVOT, mild TR, mild pHTN  U/S Abdomen 4/14 >> small fluid near cholecystectomy site  Hepatobiliary scan 4/14 >> Rt subhepatic fluid collection consistent w/ biloma; increased in size from prior  CT abd/pelvis w/ oral contrast 4/16 >> drainage catheter at inferior liver margin with decreased perihepatic fluid; low-density fluid at left subdiaphragmatic area, questionable colonic wall thickening throughout, multifocal airspace disease of lung bases  CT  abdomen.pelvis 4/21: interval growth of large perihepatic and perisplenic fluid collection with multiple, new intraperitoneal fluid collections. Resolving multifocal pneumonia.   CULTURES: Tracheal aspirate 4/11 >> no growth Abd fluid 4/11 >> no growth 5 days Abd fluid 4/15 >> no growth 3 days  ANTIBIOTICS: Zosyn 4/3 >> 4/7 Unasyn 4/11 Zosyn 4/12 - 4/19 Zosyn 4/21-  LINES/TUBES: ETT 4/11>>> OGT 4/11 PIV RUE 4/9 RLQ Biliary Drain 4/11 HD Cath LIJ 4/12 >> 4/19 L femoral trialysis catheter 4/21> RIJ cath 4/23>   SIGNIFICANT EVENTS: 4/4 - admitted w/ acute cholecystitis 4/6 - lap chole 4/9 - bile leak seen on hepatobiliary scan 4/11 - Aspirated during prep for ERCP, intubated and transferred to ICU 4/11 - Rt abd fluid drain placed by IR 4/12- Heparin gtt started for NSTEMI; held after OG bleeding and trop peak 4/13 - worsening liver enzyymes. ESRD - Anuric. CRRT started. On levophed 61mcg and fent 174mcg 4/14 - Hgb 6.0, transfused 2 units PRBCs 4/17 - DVT LUE - started bivalirudin for suspected HIT 4/18 - CVVH d/c'ed, return to intermittent HD.  4/19 - Restarted HD. HIT negative  4/20 - Bleeding from GB drain, OGT, and stools. Received 1u pRBC. Heparin gtt held. 4/21 - Multiple, new fluid collections found on CT abd/pelvis   4/23 - ERCP with cystic duct bile leak s/p stent placement, erosive gastritis, and CBD dilation  4/24 - Mid-abdomen perc drain placed by IR  4/26 - Coffee-ground emesis after restarting tube feeds   SUBJECTIVE/OVERNIGHT/INTERVAL HX -   No events overnight, no new changes   VITAL SIGNS: BP [name])  92/47   Pulse 82   Temp 97.9 F (36.6 C) (Oral)   Resp 19   Ht 5\' 2"  (1.575 m)   Wt 139 lb 15.9 oz (63.5 kg)   SpO2 93%   BMI 25.60 kg/m   HEMODYNAMICS:    VENTILATOR SETTINGS: Vent Mode: PRVC FiO2 (%):  [30 %] 30 % Set Rate:  [20 bmp] 20 bmp Vt Set:  [400 mL] 400 mL PEEP:  [5 cmH20] 5 cmH20 Plateau Pressure:  [17 cmH20-23 cmH20] 23 cmH20  INTAKE  / OUTPUT: I/O last 3 completed shifts: In: 4713.2 [I.V.:2588.2; Blood:1228; Other:52; NG/GT:290; IV Piggyback:555] Out: 4481 [Emesis/NG output:1050; Drains:520; EHUDJ:4970]  PHYSICAL EXAMINATION:  General: Chronically ill appearing female, NAD HEENT: [place]/AT, PERRL, EOM-I and MMM Cardiac:  RRR, Nl S1/S2 and -M/R/G Pulm: Coarse BS diffusely Abd: Soft, NT, ND and +BS Ext: LUE edema L>>R unchanged from yesterday, L AVF with palpable thrill, femoral trialysis cathter in place with dressing c/d/i  Neuro:  Pupils equal and symmetric, alert, following commands intermittently, moves all 4 extremities spontaneously but no purposefully    PULMONARY Recent Labs  Lab 09/08/17 0945 09/09/17 0952 09/03/2017 1840  PHART 7.253* 7.330* 7.335*  PCO2ART 25.9* 28.4* 42.6  PO2ART 166* 118.0* 95.0  HCO3 11.1* 15.1* 22.1  TCO2  --  16*  --   O2SAT 98.6 99.0 97.5   CBC Recent Labs  Lab 09/13/17 1123 09/13/17 1654 09/13/17 2130 09/14/17 0428  HGB 8.3* 7.4* 10.0* 10.5*  HCT 26.2* 23.6* 30.5* 32.5*  WBC 17.0* 15.1*  --  12.4*  PLT 42* 130*  --  89*   COAGULATION Recent Labs  Lab 09/09/2017 2300 09/11/17 0450 09/12/17 0421 09/13/17 0325 09/14/17 0428  INR 1.80 1.77 2.27 2.06 1.69   CARDIAC   Recent Labs  Lab 09/09/17 1011  TROPONINI 0.49*   No results for input(s): PROBNP in the last 168 hours.  CHEMISTRY Recent Labs  Lab 09/11/17 1520 09/12/17 0421 09/12/17 1602 09/13/17 0325 09/13/17 1650 09/14/17 0428  NA  --  136 134* 137 138 138  K  --  4.1 4.9 4.4 4.2 4.1  CL  --  104 102 106 101 101  CO2  --  22 20* 24 22 27   GLUCOSE  --  147* 170* 182* 178* 172*  BUN  --  11 12 12 19  24*  CREATININE  --  1.02* 0.92 0.73 0.92 0.87  CALCIUM  --  8.3* 8.4* 8.6* 9.1 9.1  MG 2.2 2.1 2.3 2.2  --  2.6*  PHOS 2.2* 2.7 2.9 1.5* 3.2 2.8   Estimated Creatinine Clearance: 43.7 mL/min (by C-G formula based on SCr of 0.87 mg/dL).  I/O last 3 completed shifts: In: 4713.2 [I.V.:2588.2;  Blood:1228; Other:52; NG/GT:290; IV Piggyback:555] Out: 2637 [Emesis/NG output:1050; Drains:520; Other:4378]  Intake/Output Summary (Last 24 hours) at 09/14/2017 0858 Last data filed at 09/14/2017 8588 Gross per 24 hour  Intake 3495.02 ml  Output 4769 ml  Net -1273.98 ml   LIVER Recent Labs  Lab 08/19/2017 0435  08/23/2017 2300 09/11/17 0450  09/12/17 0421 09/12/17 1602 09/13/17 0325 09/13/17 1650 09/14/17 0428  AST 3,665*  --   --  1,527*  --  723*  --  384*  --  216*  ALT 819*  --   --  509*  --  359*  --  275*  --  172*  ALKPHOS 515*  --   --  461*  --  481*  --  485*  --  455*  BILITOT 8.6*  --   --  9.8*  --  12.0*  --  12.4*  --  16.8*  PROT 5.7*  --   --  6.1*  --  5.6*  --  5.8*  --  6.4*  ALBUMIN 2.3*   < >  --  2.5*   < > 2.0* 2.0* 1.9* 2.4* 2.1*  INR 2.38   < > 1.80 1.77  --  2.27  --  2.06  --  1.69   < > = values in this interval not displayed.   INFECTIOUS Recent Labs  Lab 09/08/17 1552 09/02/2017 1906 09/11/17 0451  LATICACIDVEN 10.8* 5.5* 3.0*   ENDOCRINE CBG (last 3)  Recent Labs    09/13/17 2044 09/13/17 2332 09/14/17 0455  GLUCAP 145* 141* 159*   IMAGING x48h  - image(s) personally visualized  -   highlighted in bold Dg Chest Port 1 View  Result Date: 09/13/2017 CLINICAL DATA:  Airway intubation. EXAM: PORTABLE CHEST 1 VIEW COMPARISON:  09/12/2017. FINDINGS: ETT 2.8 cm above carina. Nasogastric tube is in the stomach. Low lung volumes. Cardiomegaly. Perihilar opacities appear improved. IMPRESSION: Support tubes and apparatus are stable. Slight improvement aeration. Electronically Signed   By: Staci Righter M.D.   On: 09/13/2017 09:00     DISCUSSION: Pam Fuller is a 84 year old female with ESRD on MWF HD, HTN, Anemia, and hx of breast cancer who was admitted 4/4 with acute cholecystitis. S/p lap chole 4/6 complicated by post-op bile leak. Went for ERCP 4/11 however developed bilious emesis during prep, hypotension, and required intubation plus  pressor support. Transferred to the ICU on 4/11.  ASSESSMENT / PLAN: PULMONARY A: Acute Respiratory Distress 2/2 Aspiration Requiring Mechanical Ventilation since 4/11  P:   - Continue full vent support, need discussion with family regarding trach vs one way extubation, recommend one way but will defer to discussion monday - VAP protocol  - Titrate O2 for sat of 88-92%  CARDIOVASCULAR A:  Aortic Atherosclerosis on CXR RBBB - present prior to admission #current Septic Shock 2/2 biliary peritonitis   Troponin elevation (peak 4.47) 2/2 demand ischemia in setting of hypotensive event - resolved  Echocardiogram w/ EF 65-70% Prolonged QTc LUE DVT - heparin started 4/19 and stopped 4/21 P:  - Levophed at 8 mcg at this point - Stopping stress dose steroids  - MAP goal > 65 - Heparin stopped in the setting of acute blood loss 4/21   RENAL  A:   ESRD on MWF HD AVF LUE P:   - CRRT restarted on 4/21, running even - Nephrology following  - Follow up output and monitor electrolytes  - Replace electrolytes as indicated  GASTROINTESTINAL A:   Acute Cholecystitis s/p lap chole 4/6 Bile leak/biliary peritonitis s/p IR Rt abdominal fluid drain 4/11 - biloma by HIDA 4/14 Shock Liver Multiple new intraperitoneal abscesses/biloma found on 4/21 Severe protein calorie malnutrition  Erosive gastritis seen on ERCP 4/23  Coffee-ground emesis 4/26 SUP P:   - INR 2.0 and Plt 41, gave 2u FFP in the setting of coffee ground emesis  - S/p mid-abdomen drain placed by IR 4/24  - Zosyn 4/21>  - s/p ERCP 4/23 with stent placement that will need removal in 6-8 weeks   - Right abdominal fluid drain in place with minimal drainage, being flush q8h  - Appreciate surgery assistance. GI signed off 4/24.  - LFTs in AM - TPN started 4/23. Will continue for now as patient  unable to tolerate tube feeds  - Continue protonix as ordered  HEMATOLOGIC A:   Hx of Breast Cancer - no acute issue Acute on  chronic anemia (AOCD) - requiring multiple transfusions  Leukocytosis  Thrombocytopenia - Plt 41 LUE (IJV) DVT Acute blood loss anemia requiring multiple transfusions  P:  - Repeating CBC stat in the setting of coffee ground emesis  - Consider repeat CT abdomen/pelvis if WBC continues to increase  - Hgb goal > 7 - CBC daily - Bivalirudin stopped 4/20, heparin initiated after HIT antibody was negative  INFECTIOUS A:   Septic Shock from Aspiration PNA - resolved  Bile Leak s/p IR Biliary Drain 4/11 Septic shock 2/2 new intraperitoneal abscesses/biloma Leukocytosis WBC 49 4/23 - likely leukemoid reaction, WBC 13 today  P:   - Zosyn 4/12 >> 4/19. Restarted Zosyn 4/21 and will continue for now - S/P ERCP and stent placement - S/p IR with mid abdomen drain placement  - Follow up new drain output culture - no organisms seen   ENDOCRINE A:   Secondary Hyperparathyroidism Hypoglycemia - likely 2/2 shock liver. Resolved P:   - TPN started 4/23 - D/c tube feeds in the setting of coffee ground emesis   - CBG monitoring q4h  - CBG goal < 180  NEUROLOGIC A:   Pain/Agitation Anisocoria  P:   - Fentanyl gtt,  - RASS goal: 0 to -1   - Fentanyl and Versed as needed  FAMILY  - Updates: No family bedside  The patient is critically ill with multiple organ systems failure and requires high complexity decision making for assessment and support, frequent evaluation and titration of therapies, application of advanced monitoring technologies and extensive interpretation of multiple databases.   Critical Care Time devoted to patient care services described in this note is  34  Minutes. This time reflects time of care of this signee Dr Jennet Maduro. This critical care time does not reflect procedure time, or teaching time or supervisory time of PA/NP/Med student/Med Resident etc but could involve care discussion time.  Rush Farmer, M.D. [facility] Pulmonary/Critical Care Medicine. Pager:  [phone]. After hours pager: [phone].

## 2017-09-14 NOTE — Progress Notes (Signed)
[facility] Progress Note  4 Days Post-Op  Subjective: CC: sepsis Underwernt successful ERCP/stent 4/23 for cystic stump leak- T bili continues to trend up On low-dose Levophed this morning Patient remains on the ventilator, trach in consideration. Following commands and tracking on wake up assessment CT 4/21 showed growth of perihepatic and perisplenic fluid collections - Additional biloma drain placed 4/24 with significant bilious output Large-volume emesis yesterday, NG tube put back to suction  Objective: Vital signs in last 24 hours: Temp:  [96.5 F (35.8 C)-98.4 F (36.9 C)] 97.9 F (36.6 C) (04/27 0720) Pulse Rate:  [80-109] 84 (04/27 0645) Resp:  [13-28] 20 (04/27 0730) BP: (77-150)/(42-82) 87/46 (04/27 0730) SpO2:  [83 %-100 %] 97 % (04/27 0645) FiO2 (%):  [30 %] 30 % (04/27 0456) Weight:  [63.5 kg (139 lb 15.9 oz)] 63.5 kg (139 lb 15.9 oz) (04/27 0500) Last BM Date: 09/12/17  Intake/Output from previous day: 04/26 0701 - 04/27 0700 In: 3475.3 [I.V.:1660.3; Blood:1228; NG/GT:90; IV Piggyback:455] Out: 6948 [Emesis/NG output:1050; Drains:380] Intake/Output this shift: Total I/O In: 97.4 [I.V.:97.4] Out: -   PE: Gen: on the vent, intermittently follows commands Card:  Regular rate and irregular rhythm Pulm:   inspiratory crackles in bases bilaterally  Abd: Soft, minimallydistended, incisions C/D/I, drain x 2 in RUQ with bloody/bilious output Skin: warm and dry, no rashes   Lab Results:  Recent Labs    09/13/17 1654 09/13/17 2130 09/14/17 0428  WBC 15.1*  --  12.4*  HGB 7.4* 10.0* 10.5*  HCT 23.6* 30.5* 32.5*  PLT 130*  --  89*   BMET Recent Labs    09/13/17 1650 09/14/17 0428  NA 138 138  K 4.2 4.1  CL 101 101  CO2 22 27  GLUCOSE 178* 172*  BUN 19 24*  CREATININE 0.92 0.87  CALCIUM 9.1 9.1   PT/INR Recent Labs    09/13/17 0325 09/14/17 0428  LABPROT 23.0* 19.8*  INR 2.06 1.69   CMP     Component Value Date/Time   NA  138 09/14/2017 0428   NA 142 02/12/2017 1400   K 4.1 09/14/2017 0428   K 3.9 02/12/2017 1400   CL 101 09/14/2017 0428   CO2 27 09/14/2017 0428   CO2 30 (H) 02/12/2017 1400   GLUCOSE 172 (H) 09/14/2017 0428   GLUCOSE 88 02/12/2017 1400   BUN 24 (H) 09/14/2017 0428   BUN 16.9 02/12/2017 1400   CREATININE 0.87 09/14/2017 0428   CREATININE 6.2 (HH) 02/12/2017 1400   CALCIUM 9.1 09/14/2017 0428   CALCIUM 9.2 02/12/2017 1400   PROT 6.4 (L) 09/14/2017 0428   PROT 6.8 02/12/2017 1400   ALBUMIN 2.1 (L) 09/14/2017 0428   ALBUMIN 3.3 (L) 02/12/2017 1400   AST 216 (H) 09/14/2017 0428   AST 13 02/12/2017 1400   ALT 172 (H) 09/14/2017 0428   ALT 7 02/12/2017 1400   ALKPHOS 455 (H) 09/14/2017 0428   ALKPHOS 75 02/12/2017 1400   BILITOT 16.8 (H) 09/14/2017 0428   BILITOT 0.68 02/12/2017 1400   GFRNONAA >60 09/14/2017 0428   GFRAA >60 09/14/2017 0428   Lipase     Component Value Date/Time   LIPASE 36 09/14/2017 1618       Studies/Results: Dg Chest Port 1 View  Result Date: 09/13/2017 CLINICAL DATA:  Airway intubation. EXAM: PORTABLE CHEST 1 VIEW COMPARISON:  09/12/2017. FINDINGS: ETT 2.8 cm above carina. Nasogastric tube is in the stomach. Low lung volumes. Cardiomegaly. Perihilar opacities appear improved. IMPRESSION: Support  tubes and apparatus are stable. Slight improvement aeration. Electronically Signed   By: Staci Righter M.D.   On: 09/13/2017 09:00    Anti-infectives: Anti-infectives (From admission, onward)   Start     Dose/Rate Route Frequency Ordered Stop   09/08/17 2000  piperacillin-tazobactam (ZOSYN) IVPB 3.375 g     3.375 g 100 mL/hr over 30 Minutes Intravenous Every 6 hours 09/08/17 1946     09/05/17 2000  piperacillin-tazobactam (ZOSYN) IVPB 3.375 g     3.375 g 12.5 mL/hr over 240 Minutes Intravenous Every 12 hours 09/05/17 1126 09/07/17 0154   08/31/17 0800  piperacillin-tazobactam (ZOSYN) IVPB 3.375 g  Status:  Discontinued     3.375 g 100 mL/hr over 30  Minutes Intravenous Every 6 hours 08/31/17 0739 09/05/17 1126   08/30/17 2000  Ampicillin-Sulbactam (UNASYN) 3 g in sodium chloride 0.9 % 100 mL IVPB  Status:  Discontinued     3 g 200 mL/hr over 30 Minutes Intravenous Every 24 hours 09/02/2017 0953 08/30/17 0945   08/30/17 1230  piperacillin-tazobactam (ZOSYN) IVPB 3.375 g  Status:  Discontinued     3.375 g 12.5 mL/hr over 240 Minutes Intravenous Every 12 hours 08/30/17 1134 08/31/17 0738   09/05/2017 1030  Ampicillin-Sulbactam (UNASYN) 3 g in sodium chloride 0.9 % 100 mL IVPB     3 g 200 mL/hr over 30 Minutes Intravenous  Once 08/29/2017 0953 09/06/2017 1246   09/09/2017 0000  ampicillin-sulbactam (UNASYN) 1.5 g in sodium chloride 0.9 % 100 mL IVPB  Status:  Discontinued     1.5 g 200 mL/hr over 30 Minutes Intravenous Once 08/28/17 1045 09/04/2017 0928   08/31/2017 2200  piperacillin-tazobactam (ZOSYN) IVPB 3.375 g    Note to Pharmacy:  Zosyn 3.375 g IV q12h in ESRD on HD   3.375 g 12.5 mL/hr over 240 Minutes Intravenous Every 12 hours 09/07/2017 1155 08/25/17 1335   08/22/17 1000  piperacillin-tazobactam (ZOSYN) IVPB 3.375 g  Status:  Discontinued    Note to Pharmacy:  Zosyn 3.375 g IV q12h in ESRD on HD   3.375 g 12.5 mL/hr over 240 Minutes Intravenous Every 12 hours 08/22/17 0128 08/23/2017 1155   08/22/17 0045  piperacillin-tazobactam (ZOSYN) IVPB 3.375 g     3.375 g 12.5 mL/hr over 240 Minutes Intravenous  Once 08/22/17 0038 08/22/17 0145       Assessment/Plan HTN ESRD - HD MWF H/o breast cancer  Acute cholecystitis s/p lap chole4/6/19 by Dr. Redmond Pulling - CT scan 4/8 w/ small amount fluid around liver and layering in pelvis ; HIDA 4/9 positive for bile leak - CT 4/21 with multiple fluid collections - additional drain placed 4/24 with good output -ERCP 4/23 w stent placement (Dr Fuller Plan) Shock liver -Total bilirubin uptrending, AST/ALT down-trending today  VDRF secondary to aspiration/PNA -CCMfollowing - appreciate assistance.    Leukocytosis downtrending.Likely secondary to aspiration PNA and bile leak  On Zosyn.  ABL Anemia + chronic disease - Continues to be coagulopathic in setting of ESRD/ shock liver, has received platelets, FFP and blood yesterday  Lactic acidosis - improved on bicarb drip and CVVH, appreciate CCM assistance  ID -zosyn 4/4>>4/7,Unasyn 4/11-->4/12, Zosyn 4/12 --> FEN -NPO/OGT-ileus secondary to bile peritonitis, NG to suction VTE -SCDs Foley -none, anuric Follow up -Dr. Redmond Pulling    LOS: 23 days    Clovis Riley , [facility] 09/14/2017, 8:08 AM

## 2017-09-15 ENCOUNTER — Inpatient Hospital Stay (HOSPITAL_COMMUNITY): Payer: Medicare Other

## 2017-09-15 LAB — RENAL FUNCTION PANEL
Albumin: 1.7 g/dL — ABNORMAL LOW (ref 3.5–5.0)
Anion gap: 11 (ref 5–15)
BUN: 28 mg/dL — ABNORMAL HIGH (ref 6–20)
CALCIUM: 8.2 mg/dL — AB (ref 8.9–10.3)
CO2: 23 mmol/L (ref 22–32)
CREATININE: 0.69 mg/dL (ref 0.44–1.00)
Chloride: 101 mmol/L (ref 101–111)
GFR calc Af Amer: >60 mL/min (ref >60)
Glucose, Bld: 190 mg/dL — ABNORMAL HIGH (ref 65–99)
Phosphorus: 3.1 mg/dL (ref 2.5–4.6)
Potassium: 4.3 mmol/L (ref 3.5–5.1)
SODIUM: 135 mmol/L (ref 135–145)

## 2017-09-15 LAB — BODY FLUID CULTURE: Culture: NO GROWTH

## 2017-09-15 LAB — GLUCOSE, CAPILLARY
GLUCOSE-CAPILLARY: 140 mg/dL — AB (ref 65–99)
GLUCOSE-CAPILLARY: 185 mg/dL — AB (ref 65–99)
Glucose-Capillary: 134 mg/dL — ABNORMAL HIGH (ref 65–99)
Glucose-Capillary: 135 mg/dL — ABNORMAL HIGH (ref 65–99)
Glucose-Capillary: 150 mg/dL — ABNORMAL HIGH (ref 65–99)

## 2017-09-15 LAB — BILIRUBIN, FRACTIONATED(TOT/DIR/INDIR)
BILIRUBIN DIRECT: 14.5 mg/dL — AB (ref 0.1–0.5)
BILIRUBIN INDIRECT: 6.3 mg/dL — AB (ref 0.3–0.9)
Total Bilirubin: 20.8 mg/dL (ref 0.3–1.2)

## 2017-09-15 LAB — PROTIME-INR
INR: 1.82
Prothrombin Time: 20.9 seconds — ABNORMAL HIGH (ref 11.4–15.2)

## 2017-09-15 LAB — CBC
HCT: 31.2 % — ABNORMAL LOW (ref 36.0–46.0)
Hemoglobin: 10 g/dL — ABNORMAL LOW (ref 12.0–15.0)
MCH: 30.1 pg (ref 26.0–34.0)
MCHC: 32.1 g/dL (ref 30.0–36.0)
MCV: 94 fL (ref 78.0–100.0)
Platelets: 50 10*3/uL — ABNORMAL LOW (ref 150–400)
RBC: 3.32 MIL/uL — AB (ref 3.87–5.11)
RDW: 20.1 % — ABNORMAL HIGH (ref 11.5–15.5)
WBC: 12.1 10*3/uL — AB (ref 4.0–10.5)

## 2017-09-15 LAB — BASIC METABOLIC PANEL
Anion gap: 8 (ref 5–15)
BUN: 28 mg/dL — AB (ref 6–20)
CO2: 26 mmol/L (ref 22–32)
CREATININE: 0.79 mg/dL (ref 0.44–1.00)
Calcium: 8.8 mg/dL — ABNORMAL LOW (ref 8.9–10.3)
Chloride: 102 mmol/L (ref 101–111)
GFR calc Af Amer: >60 mL/min (ref >60)
Glucose, Bld: 136 mg/dL — ABNORMAL HIGH (ref 65–99)
Potassium: 3.9 mmol/L (ref 3.5–5.1)
Sodium: 136 mmol/L (ref 135–145)

## 2017-09-15 LAB — PHOSPHORUS: PHOSPHORUS: 2.7 mg/dL (ref 2.5–4.6)

## 2017-09-15 LAB — MAGNESIUM: MAGNESIUM: 2.4 mg/dL (ref 1.7–2.4)

## 2017-09-15 MED ORDER — ZINC TRACE METAL 1 MG/ML IV SOLN
INTRAVENOUS | Status: AC
  Administered 2017-09-15: 17:00:00 via INTRAVENOUS
  Filled 2017-09-15: qty 760

## 2017-09-15 MED ORDER — SODIUM CHLORIDE 0.9 % IV BOLUS
500.0000 mL | Freq: Once | INTRAVENOUS | Status: AC
  Administered 2017-09-15: 500 mL via INTRAVENOUS

## 2017-09-15 MED ORDER — ORAL CARE MOUTH RINSE
15.0000 mL | OROMUCOSAL | Status: DC
  Administered 2017-09-15 – 2017-09-17 (×12): 15 mL via OROMUCOSAL

## 2017-09-15 MED ORDER — SODIUM CHLORIDE 0.9 % IV SOLN
INTRAVENOUS | Status: AC
  Administered 2017-09-15: 12:00:00 via INTRAVENOUS

## 2017-09-15 NOTE — Progress Notes (Signed)
PHARMACY - ADULT TOTAL PARENTERAL NUTRITION CONSULT NOTE   Pharmacy Consult:  TPN Indication: Intolerance to enteral nutrition  Patient Measurements: Height: 5\' 2"  (157.5 cm) Weight: 140 lb 10.5 oz (63.8 kg) IBW/kg (Calculated) : 50.1 TPN AdjBW (KG): 61.7 Body mass index is 25.73 kg/m.  Assessment:  45 YOF presented on 09/12/2017 with abdominal pain and vomiting, found to have cholecystitis and she underwent cholecystomy on 09/12/2017.  She was on a clear liquid diet before and after surgery, but did not tolerate.  Imaging showed bile leak with fluid collection around the liver, and patient aspirated prior to planned ERCP on.  She was started on TF on 09/05/17 but did not tolerate and retried on 09/06/17 without success.  Pharmacy consulted to initiate TPN for nutritional support.  Patient has moderate malnutrition and is at risk for refeeding syndrome.  GI: Has biloma drain in place with output 140 cc/24hr. Tube feeds have been stopped since 4/26. Having trouble tolerating. This has been a pattern for the patient.  Had 244ml of NG output yesterday. Albumin remains low at 1.9. Prealbumin on 4/22 was < 5. Last BM was 4/25. Endo: hypoglycemic prior to TPN, CBGs better controlled with new TPN bag (140-170s), on stress dose steroids Insulin requirements in the past 24 hours: 8 units Lytes: wnl today. CoCa 10.7.  Renal: ESRD on MWF HD now on CRRT due to pressor support. BUN up to 28. Pulm: intubated - FiO2 40% Cards: HTN, s/p NSTEMI.  MAP 50-60s, on Levophed. Vaso titrated off. AC: S/p heparin for LUE DVT. Hgb 10, plts low but stable at 50. Has been receiving some Vit K for INR. INR down to 1.82. Hepatobil: Pt in shock liver, LFTs generally downtrending, Tbili still elevated and up to 16.8. INR 2.06. Trig wnl at 105.  s/p ERCP 4/23 and CBD stent Neuro: sedated GCS 13, RASS -1, CPOT 0. On fentanyl. Propofol ordered for trach procedure but not sure if ever given. (just a one time dose) ID: Continues on  Zosyn for PNA/bile leak. All cx's are no growth so far. Afebrile, WBC trending down to 12.1.  TPN Access: R IJ placed 4/23 TPN start date: 09/08/17  Nutritional Goals (per RD rec on 09/12/17): KCal: 1250-1500 Protein: 110-122 g  Fluid: 1.3 L/day  Current Nutrition:  NPO Vital HP at 106ml/hr (21 g of protein, 27 g of dextrose, 5.6 g of lipids, and 240 kcal) TPN (88 g of protein, 180 g of dextrose, 15.4 g of lipid, which provides 1,120 kcal)  Plan:  Continue TPN at 80ml/hr  TPN provides 114 g of protein, 180 g of dextrose, 30 g of lipids for a total of 1,368 kcal, meeting 100% of patient needs Electrolytes in TPN: standard concentration exc continue higher Phos and lower Mg and Ca; Cl:Ac 1:2 Add MVI, zinc 5mg , chromium 56mcg, selenium 17mcg. Hold other trace elements due to liver injury Continue sensitive SSI and adjust as needed Monitor TPN labs, renal function panel daily F/U ability to trial TFs again   Laneisha Mino J 09/15/2017. 7:09 AM

## 2017-09-15 NOTE — Progress Notes (Signed)
Dressing changed (Abdominal, Feet/toes)

## 2017-09-15 NOTE — Consult Note (Signed)
[facility] Nurse wound consult note Reason for Consult: Deep tissue pressure injury noted to coccygeal area.  Discoloration extends to anus. Wound type: Pressure plus hypoperfusion, moisture, vasopressors, malnutrition Pressure Injury POA: No Measurement:  Coccygeal area: 10cm x 11cm area of deep purple discoloration with two areas of sloughing of the epidermis embedded within.  L>R: left with 3cm x 2cm x 0.1cm with maroon wound bed and right with 2cm x 1cm x 0.1cm with red wound bed. Scant serous exudate. Right lateral great toe:  7cm x 3cm intact serum filled blister with non-blanching erythema. No warmth, induration, or edema. (not pressure related). No drainage. Left lateral foot:  1cm round area of darkened discoloration, no warmth, edema, erythema or induration. (not pressure related).  No drainage. Left 5th digit: Digit is dark, cool. (not pressure related). No wound, no drainage. Forehead: 2cm x 4cm full thickness area of tissue loss.  Granulation and contraction in progress, some epithelialization at wound edges. Silicone foam dressing in place and area is being routinely momitored, an excellent choice for this wound.  Wound bed: As described above.  DTPI in evolution to coccygeal area. It is noteworthy that these skin injuries typically progress to full thickness tissue loss. Drainage (amount, consistency, odor) Scant serous from open areas within DTPI. Periwound: Dry, intact Dressing procedure/placement/frequency: Patient is on HD at the time of my assessment, is on vasopressors and remains critically ill, having received a trach today. She is on a therapeutic mattress with low air loss feature. In addition to the DTPI, two area of tissue necrosis are noted at the feet, the great toe on the right and the 5th digit and lateral foot on the left. These areas are likely related to circulatory infarcts. Patient is wearing bilateral pressure redistribution heel boots.   History is significant for  malnutrition, moisture, hypoperfusion and the etiology points to unavoidability. She is bring turned and repositioned per protocol and is wearing pressure redistribution heel boots. Her nurse today is a Elvina Mattes, RN.  [facility] nursing team will follow at intervals, and will remain available to this patient, the nursing and medical teams.  Please re-consult if needed in between visits. Thanks, Maudie Flakes, MSN, RN, [place], Arther Abbott  Pager# [phone]

## 2017-09-15 NOTE — Progress Notes (Signed)
Skin assessment:   Face: Forehead open skin, dark spots. Bilateral cheeks open skin post ETT. Mouth: Sores on hard palate.Tongue white/gray color with sloughing pieces of tongue.  Bottom/sacrum Large dark area mid and bilateral with open skin (white/pink color underneath). Initially(a week ago) was MSAD. Wound nurse called to assess. Great Right Toe is darker in color. Seems like a large blister with no fluid but very thin skin. Skin will possible slough off.

## 2017-09-15 NOTE — Progress Notes (Signed)
Assessment/Plan: 1 Acute cholecystitiswith post op complications- s/p lap chole 4/6. Bile duct leak sp IR biliary drain 09/07/2017 for IR4/24interventionwith drain forenlarging intraabdominal collection andERCPbiliary stent 4/23 2 Acute resp failure/ asp PNA -VDRF for trach today 3 Septic shock - 4 ESRD - on CRRT due to hypotension, onTNA; cont current CRRT RX-stable- give lvolume 5 Anemia of CKD-Hgb low. S/P prbc's/FFP 6Volume - keeping even on CRRT...CVP -11, so will give volume 7LUE DVT - acute L IJ DVT, Cath dc'd  Subjective: Interval History: CVP -1  Objective: Vital signs in last 24 hours: Temp:  [97.3 F (36.3 C)-98.3 F (36.8 C)] 97.4 F (36.3 C) (04/28 0741) Pulse Rate:  [76-109] 84 (04/28 0734) Resp:  [14-29] 15 (04/28 0800) BP: (81-135)/(40-94) 98/51 (04/28 0800) SpO2:  [87 %-100 %] 98 % (04/28 0800) FiO2 (%):  [30 %] 30 % (04/28 0800) Weight:  [63.8 kg (140 lb 10.5 oz)] 63.8 kg (140 lb 10.5 oz) (04/28 0400) Weight change: 0.3 kg (10.6 oz)  Intake/Output from previous day: 04/27 0701 - 04/28 0700 In: 3160.1 [I.V.:1850.1; NG/GT:100; IV Piggyback:1200] Out: 2197 [Emesis/NG output:270; Drains:140] Intake/Output this shift: Total I/O In: 197.6 [I.V.:187.6; Other:10] Out: 280 [Emesis/NG output:55; Drains:5; Other:220]  General appearance: eyes open Head: Normocephalic, without obvious abnormality, atraumatic Extremities: extremities normal, atraumatic, no cyanosis or edema  Lab Results: Recent Labs    09/14/17 0428 09/15/17 0321  WBC 12.4* 12.1*  HGB 10.5* 10.0*  HCT 32.5* 31.2*  PLT 89* 50*   BMET:  Recent Labs    09/14/17 1527 09/15/17 0321  NA 134* 136  K 4.0 3.9  CL 101 102  CO2 25 26  GLUCOSE 160* 136*  BUN 25* 28*  CREATININE 0.75 0.79  CALCIUM 8.6* 8.8*   No results for input(s): PTH in the last 72 hours. Iron Studies: No results for input(s): IRON, TIBC, TRANSFERRIN, FERRITIN in the last 72 hours. Studies/Results: Dg Chest  Port 1 View  Result Date: 09/15/2017 CLINICAL DATA:  Endotracheal tube present.  Respiratory failure. EXAM: PORTABLE CHEST 1 VIEW COMPARISON:  09/14/2017 FINDINGS: Endotracheal tube tip has been advanced, now 4.1 cm above the carina. Right jugular central venous catheter and nasogastric tube remain in appropriate position. Heart size is within normal limits. Aortic atherosclerosis. Stable subsegmental atelectasis in right lung base. No evidence of pulmonary consolidation or pleural effusion. IMPRESSION: Endotracheal tube in appropriate position. Stable mild right basilar atelectasis. Electronically Signed   By: Earle Gell M.D.   On: 09/15/2017 09:53   Dg Chest Port 1 View  Result Date: 09/14/2017 CLINICAL DATA:  84 year old female status post intubation EXAM: PORTABLE CHEST 1 VIEW COMPARISON:  Prior chest x-ray 09/13/2017 FINDINGS: The patient remains intubated. The tip of the endotracheal tube is at the level of the clavicles approximately 7 cm above the carina. A nasogastric tube is present. The tip lies off the field of view, presumably in the stomach. A right IJ central venous catheter remains present. The tip overlies the distal SVC. A pigtail drainage catheter projects over the left upper quadrant. Slightly improved aeration with decreasing atelectasis in the lung bases. Bilateral interstitial airspace opacities persist. Residual linear atelectasis versus scarring in the right middle lobe. No pulmonary edema, pneumothorax or pleural effusion. No new airspace opacity. IMPRESSION: 1. The tip of the endotracheal tube is now 7 cm above the carina. 2. Other support apparatus in stable and satisfactory position. 3. Improved aeration overall secondary to decreasing bibasilar atelectasis. 4. No new acute cardiopulmonary process. Electronically Signed  By: Jacqulynn Cadet M.D.   On: 09/14/2017 09:10    Scheduled: . chlorhexidine gluconate (MEDLINE KIT)  15 mL Mouth Rinse BID  . Chlorhexidine Gluconate  Cloth  6 each Topical Daily  . darbepoetin (ARANESP) injection - DIALYSIS  150 mcg Intravenous Q Mon-HD  . doxercalciferol  2 mcg Intravenous Q M,W,F-HD  . etomidate  40 mg Intravenous Once  . fentaNYL (SUBLIMAZE) injection  200 mcg Intravenous Once  . insulin aspart  0-9 Units Subcutaneous Q4H  . mouth rinse  15 mL Mouth Rinse 10 times per day  . midazolam  4 mg Intravenous Once  . pantoprazole sodium  40 mg Per Tube Daily  . sodium chloride flush  10-40 mL Intracatheter Q12H  . sodium chloride flush  5 mL Intracatheter Q8H  . vecuronium  10 mg Intravenous Once    LOS: 24 days   Estanislado Emms 09/15/2017,11:04 AM

## 2017-09-15 NOTE — Progress Notes (Signed)
PULMONARY / CRITICAL CARE MEDICINE   Name: Pam Fuller MRN: [id number] DOB: 1932/10/23    ADMISSION DATE:  08/20/2017 CONSULTATION DATE:  4/11  REFERRING MD:  Dr. Maudie Mercury  CHIEF COMPLAINT:  Aspiration  BRIEF SUMMARY:   [name] is a 84 year old female with ESRD on MWF HD, HTN, Anemia, and hx of breast cancer who presented to the ED on 4/4 with RLQ abdominal pain. She was found to have acute cholecystitis with a gallstone lodged in the gallbladder neck. CBD measured up to 10 mm. She underwent laparoscopic cholecystectomy on 4/6. She initially did well but on 4/8 was noted to have worsened pain and confusion and thought to have post-op ileus. CT abd/pelvis 4/8 showed small-mod fluid in the abdominal pelvis, read as normal post op changes but could not exclude a bile leak. A hepatobiliary scan was obtained on 4/9 which did suggest a bile leak. Surgery recommended IR drain and GI consult for ERCP. She went to the Endoscopy unit on 4/11 AM for ERCP however during prep with anesthesia she had significant bilious emesis with hypotension requiring intubation and pressor support. ERCP was not attempted. She was subsequently transferred to the medical ICU.   STUDIES:  CT abd/pelv 4/4 >> acute cholecystitis, gallstone in GB neck, CBD 10 mm  CT Head: no acute changes, chronic small vessel ischemic changes  CT abd/pelvis 4/8 >> small-mod fluid in abd/pelvis  Hepatobiliary scan 4/9 >> bile leak  TTE 4/12 >> EF 65-70%, G1DD, narrow LVOT, mild TR, mild pHTN  U/S Abdomen 4/14 >> small fluid near cholecystectomy site  Hepatobiliary scan 4/14 >> Rt subhepatic fluid collection consistent w/ biloma; increased in size from prior  CT abd/pelvis w/ oral contrast 4/16 >> drainage catheter at inferior liver margin with decreased perihepatic fluid; low-density fluid at left subdiaphragmatic area, questionable colonic wall thickening throughout, multifocal airspace disease of lung bases  CT  abdomen.pelvis 4/21: interval growth of large perihepatic and perisplenic fluid collection with multiple, new intraperitoneal fluid collections. Resolving multifocal pneumonia.   CULTURES: Tracheal aspirate 4/11 >> no growth Abd fluid 4/11 >> no growth 5 days Abd fluid 4/15 >> no growth 3 days  ANTIBIOTICS: Zosyn 4/3 >> 4/7 Unasyn 4/11 Zosyn 4/12 - 4/19 Zosyn 4/21-  LINES/TUBES: ETT 4/11>>> OGT 4/11 PIV RUE 4/9 RLQ Biliary Drain 4/11 HD Cath LIJ 4/12 >> 4/19 L femoral trialysis catheter 4/21> RIJ cath 4/23>   SIGNIFICANT EVENTS: 4/4 - admitted w/ acute cholecystitis 4/6 - lap chole 4/9 - bile leak seen on hepatobiliary scan 4/11 - Aspirated during prep for ERCP, intubated and transferred to ICU 4/11 - Rt abd fluid drain placed by IR 4/12- Heparin gtt started for NSTEMI; held after OG bleeding and trop peak 4/13 - worsening liver enzyymes. ESRD - Anuric. CRRT started. On levophed 60mcg and fent 155mcg 4/14 - Hgb 6.0, transfused 2 units PRBCs 4/17 - DVT LUE - started bivalirudin for suspected HIT 4/18 - CVVH d/c'ed, return to intermittent HD.  4/19 - Restarted HD. HIT negative  4/20 - Bleeding from GB drain, OGT, and stools. Received 1u pRBC. Heparin gtt held. 4/21 - Multiple, new fluid collections found on CT abd/pelvis   4/23 - ERCP with cystic duct bile leak s/p stent placement, erosive gastritis, and CBD dilation  4/24 - Mid-abdomen perc drain placed by IR  4/26 - Coffee-ground emesis after restarting tube feeds  4/28 - Trach 4/28  SUBJECTIVE/OVERNIGHT/INTERVAL HX -   No events overnight, no new complaints  VITAL SIGNS: BP (!) 98/51 (BP Location: Right Arm)   Pulse 84   Temp (!) 97.4 F (36.3 C) (Oral)   Resp 15   Ht 5\' 2"  (1.575 m)   Wt 140 lb 10.5 oz (63.8 kg)   SpO2 98%   BMI 25.73 kg/m   HEMODYNAMICS:    VENTILATOR SETTINGS: Vent Mode: PRVC FiO2 (%):  [30 %] 30 % Set Rate:  [20 bmp] 20 bmp Vt Set:  [400 mL] 400 mL PEEP:  [5 cmH20] 5  cmH20 Plateau Pressure:  [14 cmH20-26 cmH20] 26 cmH20  INTAKE / OUTPUT: I/O last 3 completed shifts: In: 5277 [I.V.:2716; Blood:254; Other:20; NG/GT:160; IV Piggyback:1300] Out: 8242 [Emesis/NG output:620; Drains:270; Other:2962]  PHYSICAL EXAMINATION:  General: Chronically ill appearing female, NAD HEENT: [place]/AT, PERRL, ET and OG tubes in place  Cardiac:  RRR, Nl S1/S2, no mrg  Pulm: Coarse breath sounds diffusely Abd: Soft, NT, ND and +BS Ext: LUE edema L>>R unchanged from yesterday, L AVF with palpable thrill, femoral trialysis cathter in place with dressing c/d/i  Neuro:  Pupils equal and symmetric, alert, following commands intermittently, moves all 4 extremities spontaneously but no purposefully    PULMONARY Recent Labs  Lab 09/08/17 0945 09/09/17 0952 08/24/2017 1840  PHART 7.253* 7.330* 7.335*  PCO2ART 25.9* 28.4* 42.6  PO2ART 166* 118.0* 95.0  HCO3 11.1* 15.1* 22.1  TCO2  --  16*  --   O2SAT 98.6 99.0 97.5   CBC Recent Labs  Lab 09/13/17 1654 09/13/17 2130 09/14/17 0428 09/15/17 0321  HGB 7.4* 10.0* 10.5* 10.0*  HCT 23.6* 30.5* 32.5* 31.2*  WBC 15.1*  --  12.4* 12.1*  PLT 130*  --  89* 50*   COAGULATION Recent Labs  Lab 09/11/17 0450 09/12/17 0421 09/13/17 0325 09/14/17 0428 09/15/17 0321  INR 1.77 2.27 2.06 1.69 1.82   CARDIAC   Recent Labs  Lab 09/09/17 1011  TROPONINI 0.49*   No results for input(s): PROBNP in the last 168 hours.  CHEMISTRY Recent Labs  Lab 09/12/17 0421 09/12/17 1602 09/13/17 0325 09/13/17 1650 09/14/17 0428 09/14/17 1527 09/15/17 0321  NA 136 134* 137 138 138 134* 136  K 4.1 4.9 4.4 4.2 4.1 4.0 3.9  CL 104 102 106 101 101 101 102  CO2 22 20* 24 22 27 25 26   GLUCOSE 147* 170* 182* 178* 172* 160* 136*  BUN 11 12 12 19  24* 25* 28*  CREATININE 1.02* 0.92 0.73 0.92 0.87 0.75 0.79  CALCIUM 8.3* 8.4* 8.6* 9.1 9.1 8.6* 8.8*  MG 2.1 2.3 2.2  --  2.6*  --  2.4  PHOS 2.7 2.9 1.5* 3.2 2.8 2.6 2.7   Estimated Creatinine  Clearance: 47.6 mL/min (by C-G formula based on SCr of 0.79 mg/dL).  I/O last 3 completed shifts: In: 3536 [I.V.:2716; Blood:254; Other:20; NG/GT:160; IV Piggyback:1300] Out: 1443 [Emesis/NG output:620; Drains:270; Other:2962]  Intake/Output Summary (Last 24 hours) at 09/15/2017 0921 Last data filed at 09/15/2017 0800 Gross per 24 hour  Intake 3038.68 ml  Output 2104 ml  Net 934.68 ml   LIVER Recent Labs  Lab 09/01/2017 0435  09/11/17 0450  09/12/17 0421 09/12/17 1602 09/13/17 0325 09/13/17 1650 09/14/17 0428 09/14/17 1527 09/15/17 0321  AST 3,665*  --  1,527*  --  723*  --  384*  --  216*  --   --   ALT 819*  --  509*  --  359*  --  275*  --  172*  --   --  ALKPHOS 515*  --  461*  --  481*  --  485*  --  455*  --   --   BILITOT 8.6*  --  9.8*  --  12.0*  --  12.4*  --  16.8*  --   --   PROT 5.7*  --  6.1*  --  5.6*  --  5.8*  --  6.4*  --   --   ALBUMIN 2.3*   < > 2.5*   < > 2.0* 2.0* 1.9* 2.4* 2.1* 1.9*  --   INR 2.38   < > 1.77  --  2.27  --  2.06  --  1.69  --  1.82   < > = values in this interval not displayed.   INFECTIOUS Recent Labs  Lab 09/08/17 1552 08/19/2017 1906 09/11/17 0451  LATICACIDVEN 10.8* 5.5* 3.0*   ENDOCRINE CBG (last 3)  Recent Labs    09/14/17 2334 09/15/17 0324 09/15/17 0744  GLUCAP 148* 134* 135*   IMAGING x48h  - image(s) personally visualized  -   highlighted in bold Dg Chest Port 1 View  Result Date: 09/14/2017 CLINICAL DATA:  84 year old female status post intubation EXAM: PORTABLE CHEST 1 VIEW COMPARISON:  Prior chest x-ray 09/13/2017 FINDINGS: The patient remains intubated. The tip of the endotracheal tube is at the level of the clavicles approximately 7 cm above the carina. A nasogastric tube is present. The tip lies off the field of view, presumably in the stomach. A right IJ central venous catheter remains present. The tip overlies the distal SVC. A pigtail drainage catheter projects over the left upper quadrant. Slightly improved  aeration with decreasing atelectasis in the lung bases. Bilateral interstitial airspace opacities persist. Residual linear atelectasis versus scarring in the right middle lobe. No pulmonary edema, pneumothorax or pleural effusion. No new airspace opacity. IMPRESSION: 1. The tip of the endotracheal tube is now 7 cm above the carina. 2. Other support apparatus in stable and satisfactory position. 3. Improved aeration overall secondary to decreasing bibasilar atelectasis. 4. No new acute cardiopulmonary process. Electronically Signed   By: Jacqulynn Cadet M.D.   On: 09/14/2017 09:10     DISCUSSION: [name] is a 84 year old female with ESRD on MWF HD, HTN, Anemia, and hx of breast cancer who was admitted 4/4 with acute cholecystitis. S/p lap chole 4/6 complicated by post-op bile leak. Went for ERCP 4/11 however developed bilious emesis during prep, hypotension, and required intubation plus pressor support. Transferred to the ICU on 4/11.  ASSESSMENT / PLAN: PULMONARY A: Acute Respiratory Distress 2/2 Aspiration Requiring Mechanical Ventilation since 4/11  P:   - Full vent support - Trach today - VAP protocol  - Titrate O2 for sat of 88-92%  CARDIOVASCULAR A:  Aortic Atherosclerosis on CXR RBBB - present prior to admission #current Septic Shock 2/2 biliary peritonitis   Troponin elevation (peak 4.47) 2/2 demand ischemia in setting of hypotensive event - resolved  Echocardiogram w/ EF 65-70% Prolonged QTc LUE DVT - heparin started 4/19 and stopped 4/21 P:  - Levophed for BP support at 10 mcg - MAP goal > 65 - Heparin stopped in the setting of acute blood loss 4/21   RENAL  A:   ESRD on MWF HD AVF LUE P:   - CRRT running even - Nephrology following  - Follow up output and monitor electrolytes  - Replace electrolytes as indicated  GASTROINTESTINAL A:   Acute Cholecystitis s/p lap chole 4/6 Bile  leak/biliary peritonitis s/p IR Rt abdominal fluid drain 4/11 - biloma  by HIDA 4/14 Shock Liver Multiple new intraperitoneal biloma found on 4/21 Severe protein calorie malnutrition  Erosive gastritis seen on ERCP 4/23  Coffee-ground emesis 4/26 SUP P:   - Zosyn 4/21>  - S/p mid-abdomen drain placed by IR 4/24  - s/p ERCP 4/23 with stent placement that will need removal in 6-8 weeks   - Right abdominal fluid drain in place with minimal drainage, being flush q8h  - Appreciate surgery assistance. GI signed off 4/24.  - LFTs in AM - TPN started 4/23. Will continue for now as patient unable to tolerate tube feeds  - Continue protonix as ordered  HEMATOLOGIC A:   Hx of Breast Cancer - no acute issue Acute on chronic anemia (AOCD) - requiring multiple transfusions  Leukocytosis  Thrombocytopenia - Plt 50 LUE (IJV) DVT Acute blood loss anemia requiring multiple transfusions  P:   - Hgb goal > 7 - CBC daily - Bivalirudin stopped 4/20, heparin initiated after HIT antibody was negative  INFECTIOUS A:   Septic Shock from Aspiration PNA - resolved  Bile Leak s/p IR Biliary Drain 4/11 Septic shock 2/2 new intraperitoneal abscesses/biloma Leukocytosis WBC 49 4/23 - likely leukemoid reaction  P:   - Zosyn 4/12 >> 4/19. Restarted Zosyn 4/21 and will continue for now  - S/P ERCP and stent placement - S/p IR with mid abdomen drain placement  - Drain output culture - negative at 2 days   ENDOCRINE A:   Secondary Hyperparathyroidism Hypoglycemia - likely 2/2 shock liver. Resolved  P:   - TPN started 4/23 - CBG monitoring q4h  - CBG goal < 180  NEUROLOGIC A:   Pain/Agitation Anisocoria  P:   - Fentanyl gtt,  - RASS goal: 0 to -1   - Fentanyl and Versed as needed  FAMILY  - Updates: Extensive discussion with family, remains full code, proceed with trach today.  The patient is critically ill with multiple organ systems failure and requires high complexity decision making for assessment and support, frequent evaluation and titration of therapies,  application of advanced monitoring technologies and extensive interpretation of multiple databases.   Critical Care Time devoted to patient care services described in this note is  34  Minutes. This time reflects time of care of this signee Dr Jennet Maduro. This critical care time does not reflect procedure time, or teaching time or supervisory time of PA/NP/Med student/Med Resident etc but could involve care discussion time.  Rush Farmer, M.D. [facility] Pulmonary/Critical Care Medicine. Pager: [phone]. After hours pager: [phone].

## 2017-09-15 NOTE — Procedures (Signed)
Bronchoscopy Procedure Note Pam Fuller [id number] [birth year]  Procedure: Bronchoscopy Indications: for percutaneous trach placement   Procedure Details Consent: Risks of procedure as well as the alternatives and risks of each were explained to the (patient/caregiver).  Consent for procedure obtained. Time Out: Verified patient identification, verified procedure, site/side was marked, verified correct patient position, special equipment/implants available, medications/allergies/relevent history reviewed, required imaging and test results available.  Performed  In preparation for procedure, patient was given 100% FiO2 and bronchoscope lubricated. Sedation: Benzodiazepines, Muscle relaxants and Etomidate  Airway entered and the following bronchi were examined:  Procedure done by P  ACNP-BC. At first bronch was introduce through ET tube and structures of tracheal rings, carina identified for operator of tracheostomy who was Dr Nelda Marseille. Light of bronch passed through trachea and skin for indentification of tracheal rings for tracheostomy puncture. After this, under bronchoscopy guidance,  ET tube was pulled back sufficiently and very carefully. The ET tube was  pulled back enough to give room for tracheostomy operator and yet at same time to to ensure a secured airway. After this was accomplished, bronchoscope was withdrawn into the ET tube. After this,  Dr Nelda Marseille then performed tracheostomy under video visual provided by flexible video bronchoscopy. Followng introduction of tracheostomy,  the bronchoscope was removed from ET tube and introduced through tracheostomy. Correct position of tracheostomy was ensured, with enough room between carina and distal tracheostomy and no evidence of bleeding. The bronchoscope was then withdrawn. Respiratory therapist was then instructed to remove the ET tube.  Dr Nelda Marseille then proceeded to complete the tracheostomy with stay sutures   No complications .     Erick Colace ACNP-BC [place] Pager # [phone] OR # [phone] if no answer  Clementeen Graham 09/15/2017

## 2017-09-15 NOTE — Progress Notes (Signed)
PULMONARY / CRITICAL CARE MEDICINE   Name: Pam Fuller MRN: [id number] DOB: 02-19-1933    ADMISSION DATE:  09/04/2017 CONSULTATION DATE:  4/11  REFERRING MD:  Dr. Maudie Mercury  CHIEF COMPLAINT:  Aspiration  BRIEF SUMMARY:   [name] is a 84 year old female with ESRD on MWF HD, HTN, Anemia, and hx of breast cancer who presented to the ED on 4/4 with RLQ abdominal pain. She was found to have acute cholecystitis with a gallstone lodged in the gallbladder neck. CBD measured up to 10 mm. She underwent laparoscopic cholecystectomy on 4/6. She initially did well but on 4/8 was noted to have worsened pain and confusion and thought to have post-op ileus. CT abd/pelvis 4/8 showed small-mod fluid in the abdominal pelvis, read as normal post op changes but could not exclude a bile leak. A hepatobiliary scan was obtained on 4/9 which did suggest a bile leak. Surgery recommended IR drain and GI consult for ERCP. She went to the Endoscopy unit on 4/11 AM for ERCP however during prep with anesthesia she had significant bilious emesis with hypotension requiring intubation and pressor support. ERCP was not attempted. She was subsequently transferred to the medical ICU.   STUDIES:  CT abd/pelv 4/4 >> acute cholecystitis, gallstone in GB neck, CBD 10 mm  CT Head: no acute changes, chronic small vessel ischemic changes  CT abd/pelvis 4/8 >> small-mod fluid in abd/pelvis  Hepatobiliary scan 4/9 >> bile leak  TTE 4/12 >> EF 65-70%, G1DD, narrow LVOT, mild TR, mild pHTN  U/S Abdomen 4/14 >> small fluid near cholecystectomy site  Hepatobiliary scan 4/14 >> Rt subhepatic fluid collection consistent w/ biloma; increased in size from prior  CT abd/pelvis w/ oral contrast 4/16 >> drainage catheter at inferior liver margin with decreased perihepatic fluid; low-density fluid at left subdiaphragmatic area, questionable colonic wall thickening throughout, multifocal airspace disease of lung bases  CT  abdomen.pelvis 4/21: interval growth of large perihepatic and perisplenic fluid collection with multiple, new intraperitoneal fluid collections. Resolving multifocal pneumonia.   CULTURES: Tracheal aspirate 4/11 >> no growth Abd fluid 4/11 >> no growth 5 days Abd fluid 4/15 >> no growth 3 days  ANTIBIOTICS: Zosyn 4/3 >> 4/7 Unasyn 4/11 Zosyn 4/12 - 4/19 Zosyn 4/21-  LINES/TUBES: ETT 4/11>>> OGT 4/11 PIV RUE 4/9 RLQ Biliary Drain 4/11 HD Cath LIJ 4/12 >> 4/19 L femoral trialysis catheter 4/21> RIJ cath 4/23>   SIGNIFICANT EVENTS: 4/4 - admitted w/ acute cholecystitis 4/6 - lap chole 4/9 - bile leak seen on hepatobiliary scan 4/11 - Aspirated during prep for ERCP, intubated and transferred to ICU 4/11 - Rt abd fluid drain placed by IR 4/12- Heparin gtt started for NSTEMI; held after OG bleeding and trop peak 4/13 - worsening liver enzyymes. ESRD - Anuric. CRRT started. On levophed 26mcg and fent 113mcg 4/14 - Hgb 6.0, transfused 2 units PRBCs 4/17 - DVT LUE - started bivalirudin for suspected HIT 4/18 - CVVH d/c'ed, return to intermittent HD.  4/19 - Restarted HD. HIT negative  4/20 - Bleeding from GB drain, OGT, and stools. Received 1u pRBC. Heparin gtt held. 4/21 - Multiple, new fluid collections found on CT abd/pelvis   4/23 - ERCP with cystic duct bile leak s/p stent placement, erosive gastritis, and CBD dilation  4/24 - Mid-abdomen perc drain placed by IR  4/26 - Coffee-ground emesis after restarting tube feeds   SUBJECTIVE/OVERNIGHT/INTERVAL HX -  No acute events overnight. BP remains soft on levophed. Sedated and unable  to follow commands.     VITAL SIGNS: BP (!) 97/51   Pulse 88   Temp (!) 97.3 F (36.3 C) (Oral)   Resp 19   Ht 5\' 2"  (1.575 m)   Wt 140 lb 10.5 oz (63.8 kg)   SpO2 97%   BMI 25.73 kg/m   HEMODYNAMICS:    VENTILATOR SETTINGS: Vent Mode: PRVC FiO2 (%):  [30 %] 30 % Set Rate:  [20 bmp] 20 bmp Vt Set:  [400 mL] 400 mL PEEP:  [5 cmH20] 5  cmH20 Plateau Pressure:  [14 cmH20-23 cmH20] 14 cmH20  INTAKE / OUTPUT: I/O last 3 completed shifts: In: 5476.3 [I.V.:2501.3; Blood:1228; Other:42; NG/GT:150; IV [id number]:9323] Out: 5573 [Emesis/NG output:1240; Drains:460; UKGUR:4270]  PHYSICAL EXAMINATION:  General: Chronically ill appearing female, sedated, NAD  HEENT: [place]/AT, PERRL, ET and OG tubes in place  Cardiac:  RRR, Nl S1/S2, no mrg  Pulm: Coarse breath sounds diffusely Abd: Soft, NT, ND and decreased bowel sounds  Ext: LUE edema L>>R unchanged from yesterday, L AVF with palpable thrill, femoral trialysis cathter in place with dressing c/d/i  Neuro:  Pupils equal and symmetric, alert, following commands intermittently, moves all 4 extremities spontaneously but no purposefully    PULMONARY Recent Labs  Lab 09/08/17 0945 09/09/17 0952 08/30/2017 1840  PHART 7.253* 7.330* 7.335*  PCO2ART 25.9* 28.4* 42.6  PO2ART 166* 118.0* 95.0  HCO3 11.1* 15.1* 22.1  TCO2  --  16*  --   O2SAT 98.6 99.0 97.5   CBC Recent Labs  Lab 09/13/17 1654 09/13/17 2130 09/14/17 0428 09/15/17 0321  HGB 7.4* 10.0* 10.5* 10.0*  HCT 23.6* 30.5* 32.5* 31.2*  WBC 15.1*  --  12.4* 12.1*  PLT 130*  --  89* 50*   COAGULATION Recent Labs  Lab 09/11/17 0450 09/12/17 0421 09/13/17 0325 09/14/17 0428 09/15/17 0321  INR 1.77 2.27 2.06 1.69 1.82   CARDIAC   Recent Labs  Lab 09/09/17 1011  TROPONINI 0.49*   No results for input(s): PROBNP in the last 168 hours.  CHEMISTRY Recent Labs  Lab 09/12/17 0421 09/12/17 1602 09/13/17 0325 09/13/17 1650 09/14/17 0428 09/14/17 1527 09/15/17 0321  NA 136 134* 137 138 138 134* 136  K 4.1 4.9 4.4 4.2 4.1 4.0 3.9  CL 104 102 106 101 101 101 102  CO2 22 20* 24 22 27 25 26   GLUCOSE 147* 170* 182* 178* 172* 160* 136*  BUN 11 12 12 19  24* 25* 28*  CREATININE 1.02* 0.92 0.73 0.92 0.87 0.75 0.79  CALCIUM 8.3* 8.4* 8.6* 9.1 9.1 8.6* 8.8*  MG 2.1 2.3 2.2  --  2.6*  --  2.4  PHOS 2.7 2.9 1.5* 3.2  2.8 2.6 2.7   Estimated Creatinine Clearance: 47.6 mL/min (by C-G formula based on SCr of 0.79 mg/dL).  I/O last 3 completed shifts: In: 5476.3 [I.V.:2501.3; Blood:1228; Other:42; NG/GT:150; IV [id number]:5176] Out: 1607 [Emesis/NG output:1240; Drains:460; PXTGG:2694]  Intake/Output Summary (Last 24 hours) at 09/15/2017 0645 Last data filed at 09/15/2017 0600 Gross per 24 hour  Intake 3135.64 ml  Output 2205 ml  Net 930.64 ml   LIVER Recent Labs  Lab 08/26/2017 0435  09/11/17 0450  09/12/17 0421 09/12/17 1602 09/13/17 0325 09/13/17 1650 09/14/17 0428 09/14/17 1527 09/15/17 0321  AST 3,665*  --  1,527*  --  723*  --  384*  --  216*  --   --   ALT 819*  --  509*  --  359*  --  275*  --  172*  --   --   ALKPHOS 515*  --  461*  --  481*  --  485*  --  455*  --   --   BILITOT 8.6*  --  9.8*  --  12.0*  --  12.4*  --  16.8*  --   --   PROT 5.7*  --  6.1*  --  5.6*  --  5.8*  --  6.4*  --   --   ALBUMIN 2.3*   < > 2.5*   < > 2.0* 2.0* 1.9* 2.4* 2.1* 1.9*  --   INR 2.38   < > 1.77  --  2.27  --  2.06  --  1.69  --  1.82   < > = values in this interval not displayed.   INFECTIOUS Recent Labs  Lab 09/08/17 1552 09/03/2017 1906 09/11/17 0451  LATICACIDVEN 10.8* 5.5* 3.0*   ENDOCRINE CBG (last 3)  Recent Labs    09/14/17 2004 09/14/17 2334 09/15/17 0324  GLUCAP 145* 148* 134*   IMAGING x48h  - image(s) personally visualized  -   highlighted in bold Dg Chest Port 1 View  Result Date: 09/14/2017 CLINICAL DATA:  84 year old female status post intubation EXAM: PORTABLE CHEST 1 VIEW COMPARISON:  Prior chest x-ray 09/13/2017 FINDINGS: The patient remains intubated. The tip of the endotracheal tube is at the level of the clavicles approximately 7 cm above the carina. A nasogastric tube is present. The tip lies off the field of view, presumably in the stomach. A right IJ central venous catheter remains present. The tip overlies the distal SVC. A pigtail drainage catheter projects over  the left upper quadrant. Slightly improved aeration with decreasing atelectasis in the lung bases. Bilateral interstitial airspace opacities persist. Residual linear atelectasis versus scarring in the right middle lobe. No pulmonary edema, pneumothorax or pleural effusion. No new airspace opacity. IMPRESSION: 1. The tip of the endotracheal tube is now 7 cm above the carina. 2. Other support apparatus in stable and satisfactory position. 3. Improved aeration overall secondary to decreasing bibasilar atelectasis. 4. No new acute cardiopulmonary process. Electronically Signed   By: Jacqulynn Cadet M.D.   On: 09/14/2017 09:10     DISCUSSION: [name] is a 84 year old female with ESRD on MWF HD, HTN, Anemia, and hx of breast cancer who was admitted 4/4 with acute cholecystitis. S/p lap chole 4/6 complicated by post-op bile leak. Went for ERCP 4/11 however developed bilious emesis during prep, hypotension, and required intubation plus pressor support. Transferred to the ICU on 4/11.  ASSESSMENT / PLAN: PULMONARY A: Acute Respiratory Distress 2/2 Aspiration Requiring Mechanical Ventilation since 4/11  P:   - Continue full vent support, plan for tracheostomy today 4/28 - VAP protocol  - Titrate O2 for sat of 88-92%  CARDIOVASCULAR A:  Aortic Atherosclerosis on CXR RBBB - present prior to admission #current Septic Shock 2/2 biliary peritonitis   Troponin elevation (peak 4.47) 2/2 demand ischemia in setting of hypotensive event - resolved  Echocardiogram w/ EF 65-70% Prolonged QTc LUE DVT - heparin started 4/19 and stopped 4/21 P:  - Levophed at 10 mcg at this point - MAP goal > 65 - Heparin stopped in the setting of acute blood loss 4/21   RENAL  A:   ESRD on MWF HD AVF LUE P:   - CRRT restarted on 4/21, running even - Nephrology following  - Follow up output and monitor electrolytes  - Replace electrolytes  as indicated  GASTROINTESTINAL A:   Acute Cholecystitis s/p lap  chole 4/6 Bile leak/biliary peritonitis s/p IR Rt abdominal fluid drain 4/11 - biloma by HIDA 4/14 Shock Liver Multiple new intraperitoneal biloma found on 4/21 Severe protein calorie malnutrition  Erosive gastritis seen on ERCP 4/23  Coffee-ground emesis 4/26 SUP P:   - Zosyn 4/21>  - S/p mid-abdomen drain placed by IR 4/24  - s/p ERCP 4/23 with stent placement that will need removal in 6-8 weeks   - Right abdominal fluid drain in place with minimal drainage, being flush q8h  - Appreciate surgery assistance. GI signed off 4/24.  - LFTs in AM - TPN started 4/23. Will continue for now as patient unable to tolerate tube feeds  - Continue protonix as ordered  HEMATOLOGIC A:   Hx of Breast Cancer - no acute issue Acute on chronic anemia (AOCD) - requiring multiple transfusions  Leukocytosis  Thrombocytopenia - Plt 50 LUE (IJV) DVT Acute blood loss anemia requiring multiple transfusions  P:   - Hgb goal > 7 - CBC daily - Bivalirudin stopped 4/20, heparin initiated after HIT antibody was negative  INFECTIOUS A:   Septic Shock from Aspiration PNA - resolved  Bile Leak s/p IR Biliary Drain 4/11 Septic shock 2/2 new intraperitoneal abscesses/biloma Leukocytosis WBC 49 4/23 - likely leukemoid reaction  P:   - Zosyn 4/12 >> 4/19. Restarted Zosyn 4/21 and will continue for now  - S/P ERCP and stent placement - S/p IR with mid abdomen drain placement  - Drain output culture - negative at 2 days   ENDOCRINE A:   Secondary Hyperparathyroidism Hypoglycemia - likely 2/2 shock liver. Resolved  P:   - TPN started 4/23 - CBG monitoring q4h  - CBG goal < 180  NEUROLOGIC A:   Pain/Agitation Anisocoria  P:   - Fentanyl gtt,  - RASS goal: 0 to -1   - Fentanyl and Versed as needed  FAMILY  - Updates: No family bedside    Welford Roche, MD  Internal Medicine PGY-1  P [phone]

## 2017-09-15 NOTE — Procedures (Addendum)
Bedside Tracheostomy Insertion Procedure Note   Patient Details:   Name: Pam Fuller DOB: 02/20/1933 MRN: [id number]  Procedure: Tracheostomy  Pre Procedure Assessment: ET Tube Size:7.5 ET Tube secured at lip (cm):22 Bite block in place: Yes Breath Sounds: Clear and Diminished  Post Procedure Assessment: BP (!) 107/54   Pulse 75   Temp (!) 97.3 F (36.3 C) (Oral)   Resp (!) 21   Ht 5\' 2"  (1.575 m)   Wt 140 lb 10.5 oz (63.8 kg)   SpO2 100%   BMI 25.73 kg/m  O2 sats: stable throughout Complications: No apparent complications Patient did tolerate procedure well Tracheostomy Brand:Shiley Tracheostomy Style:Cuffed Tracheostomy Size: 6 Tracheostomy Secured JSE:[id number] Tracheostomy Placement Confirmation:Trach cuff visualized and in place and Chest X ray ordered for placement   RT assisted Dr. Nelda Marseille and Marni Griffon with bedside trach.     Elwin Mocha    09/15/2017, 12:00

## 2017-09-15 NOTE — Progress Notes (Signed)
5 Days Post-Op   Subjective/Chief Complaint: Pt stable Trach placed today Bili labs pending   Objective: Vital signs in last 24 hours: Temp:  [97.3 F (36.3 C)-98.3 F (36.8 C)] 97.3 F (36.3 C) (04/28 1213) Pulse Rate:  [75-109] 91 (04/28 1504) Resp:  [14-28] 25 (04/28 1504) BP: (80-133)/(40-77) 100/53 (04/28 1504) SpO2:  [87 %-100 %] 95 % (04/28 1504) FiO2 (%):  [30 %] 30 % (04/28 1504) Weight:  [63.8 kg (140 lb 10.5 oz)] 63.8 kg (140 lb 10.5 oz) (04/28 0400) Last BM Date: 09/14/17  Intake/Output from previous day: 04/27 0701 - 04/28 0700 In: 3160.1 [I.V.:1850.1; NG/GT:100; IV Piggyback:1200] Out: 2197 [Emesis/NG output:270; Drains:140] Intake/Output this shift: Total I/O In: 1871.7 [I.V.:1311.7; Other:10; IV Piggyback:550] Out: 663 [Emesis/NG output:205; Drains:10; Other:448]  General appearance: sedated Resp: clear to auscultation bilaterally Cardio: regular rate and rhythm, S1, S2 normal, no murmur, click, rub or gallop GI: soft, non-tender; bowel sounds normal; no masses,  no organomegaly and JP bilious  Lab Results:  Recent Labs    09/14/17 0428 09/15/17 0321  WBC 12.4* 12.1*  HGB 10.5* 10.0*  HCT 32.5* 31.2*  PLT 89* 50*   BMET Recent Labs    09/14/17 1527 09/15/17 0321  NA 134* 136  K 4.0 3.9  CL 101 102  CO2 25 26  GLUCOSE 160* 136*  BUN 25* 28*  CREATININE 0.75 0.79  CALCIUM 8.6* 8.8*   PT/INR Recent Labs    09/14/17 0428 09/15/17 0321  LABPROT 19.8* 20.9*  INR 1.69 1.82   ABG No results for input(s): PHART, HCO3 in the last 72 hours.  Invalid input(s): PCO2, PO2  Studies/Results: Dg Chest Port 1 View  Result Date: 09/15/2017 CLINICAL DATA:  Tracheostomy status EXAM: PORTABLE CHEST 1 VIEW COMPARISON:  09/15/2017 600 hours FINDINGS: The endotracheal tube has been exchanged for a tracheostomy tube. The tip is 5.1 cm from the carina. Low lung volumes. Normal heart size. Bibasilar atelectasis increased. Bilateral nephroureteral  catheter is in place. Stable right jugular central venous catheter. NG tube removed. IMPRESSION: Tracheostomy tube is in place. Bibasilar atelectasis increased Electronically Signed   By: Marybelle Killings M.D.   On: 09/15/2017 12:58   Dg Chest Port 1 View  Result Date: 09/15/2017 CLINICAL DATA:  Endotracheal tube present.  Respiratory failure. EXAM: PORTABLE CHEST 1 VIEW COMPARISON:  09/14/2017 FINDINGS: Endotracheal tube tip has been advanced, now 4.1 cm above the carina. Right jugular central venous catheter and nasogastric tube remain in appropriate position. Heart size is within normal limits. Aortic atherosclerosis. Stable subsegmental atelectasis in right lung base. No evidence of pulmonary consolidation or pleural effusion. IMPRESSION: Endotracheal tube in appropriate position. Stable mild right basilar atelectasis. Electronically Signed   By: Earle Gell M.D.   On: 09/15/2017 09:53   Dg Chest Port 1 View  Result Date: 09/14/2017 CLINICAL DATA:  84 year old female status post intubation EXAM: PORTABLE CHEST 1 VIEW COMPARISON:  Prior chest x-ray 09/13/2017 FINDINGS: The patient remains intubated. The tip of the endotracheal tube is at the level of the clavicles approximately 7 cm above the carina. A nasogastric tube is present. The tip lies off the field of view, presumably in the stomach. A right IJ central venous catheter remains present. The tip overlies the distal SVC. A pigtail drainage catheter projects over the left upper quadrant. Slightly improved aeration with decreasing atelectasis in the lung bases. Bilateral interstitial airspace opacities persist. Residual linear atelectasis versus scarring in the right middle lobe. No pulmonary edema, pneumothorax  or pleural effusion. No new airspace opacity. IMPRESSION: 1. The tip of the endotracheal tube is now 7 cm above the carina. 2. Other support apparatus in stable and satisfactory position. 3. Improved aeration overall secondary to decreasing  bibasilar atelectasis. 4. No new acute cardiopulmonary process. Electronically Signed   By: Jacqulynn Cadet M.D.   On: 09/14/2017 09:10    Anti-infectives: Anti-infectives (From admission, onward)   Start     Dose/Rate Route Frequency Ordered Stop   09/08/17 2000  piperacillin-tazobactam (ZOSYN) IVPB 3.375 g     3.375 g 100 mL/hr over 30 Minutes Intravenous Every 6 hours 09/08/17 1946     09/05/17 2000  piperacillin-tazobactam (ZOSYN) IVPB 3.375 g     3.375 g 12.5 mL/hr over 240 Minutes Intravenous Every 12 hours 09/05/17 1126 09/07/17 0154   08/31/17 0800  piperacillin-tazobactam (ZOSYN) IVPB 3.375 g  Status:  Discontinued     3.375 g 100 mL/hr over 30 Minutes Intravenous Every 6 hours 08/31/17 0739 09/05/17 1126   08/30/17 2000  Ampicillin-Sulbactam (UNASYN) 3 g in sodium chloride 0.9 % 100 mL IVPB  Status:  Discontinued     3 g 200 mL/hr over 30 Minutes Intravenous Every 24 hours 08/28/2017 0953 08/30/17 0945   08/30/17 1230  piperacillin-tazobactam (ZOSYN) IVPB 3.375 g  Status:  Discontinued     3.375 g 12.5 mL/hr over 240 Minutes Intravenous Every 12 hours 08/30/17 1134 08/31/17 0738   09/01/2017 1030  Ampicillin-Sulbactam (UNASYN) 3 g in sodium chloride 0.9 % 100 mL IVPB     3 g 200 mL/hr over 30 Minutes Intravenous  Once 08/25/2017 0953 09/02/2017 1246   09/16/2017 0000  ampicillin-sulbactam (UNASYN) 1.5 g in sodium chloride 0.9 % 100 mL IVPB  Status:  Discontinued     1.5 g 200 mL/hr over 30 Minutes Intravenous Once 08/28/17 1045 09/07/2017 0928   08/26/2017 2200  piperacillin-tazobactam (ZOSYN) IVPB 3.375 g    Note to Pharmacy:  Zosyn 3.375 g IV q12h in ESRD on HD   3.375 g 12.5 mL/hr over 240 Minutes Intravenous Every 12 hours 08/26/2017 1155 08/25/17 1335   08/22/17 1000  piperacillin-tazobactam (ZOSYN) IVPB 3.375 g  Status:  Discontinued    Note to Pharmacy:  Zosyn 3.375 g IV q12h in ESRD on HD   3.375 g 12.5 mL/hr over 240 Minutes Intravenous Every 12 hours 08/22/17 0128 09/17/2017  1155   08/22/17 0045  piperacillin-tazobactam (ZOSYN) IVPB 3.375 g     3.375 g 12.5 mL/hr over 240 Minutes Intravenous  Once 08/22/17 0038 08/22/17 0145      Assessment/Plan:  HTN ESRD - HD MWF H/o breast cancer  Acute cholecystitis s/p lap chole4/6/19 by Dr. Redmond Pulling - CT scan 4/8 w/ small amount fluid around liver and layering in pelvis ; HIDA 4/9 positive for bile leak - CT 4/21 with multiple fluid collections -additional drain placed 4/24 with good output -ERCP 4/23 w stent placement (Dr Fuller Plan) Shock liver -Total bilirubin uptrending, AST/ALT down- Labs pending this PM  VDRF secondary to aspiration/PNA -CCMfollowing - appreciate assistance.   Leukocytosis Stable Likely secondary to aspiration PNA and bile leak On Zosyn.  ABL Anemia+ chronic disease  Lactic acidosis- improvedon bicarb drip and CVVH, appreciate CCM assistance  ID -zosyn 4/4>>4/7,Unasyn 4/11-->4/12, Zosyn 4/12 --> FEN -NPO/OGT-ileus secondary to bile peritonitis, NG to suction VTE -SCDs Foley -none, anuric Follow up -Dr. Redmond Pulling    LOS: 24 days    Rosario Jacks., [facility] 09/15/2017

## 2017-09-16 ENCOUNTER — Inpatient Hospital Stay (HOSPITAL_COMMUNITY): Payer: Medicare Other

## 2017-09-16 DIAGNOSIS — R17 Unspecified jaundice: Secondary | ICD-10-CM

## 2017-09-16 DIAGNOSIS — D696 Thrombocytopenia, unspecified: Secondary | ICD-10-CM

## 2017-09-16 LAB — COMPREHENSIVE METABOLIC PANEL
ALBUMIN: 1.6 g/dL — AB (ref 3.5–5.0)
ALT: 120 U/L — ABNORMAL HIGH (ref 14–54)
ANION GAP: 9 (ref 5–15)
AST: 142 U/L — AB (ref 15–41)
Alkaline Phosphatase: 432 U/L — ABNORMAL HIGH (ref 38–126)
BUN: 28 mg/dL — AB (ref 6–20)
CHLORIDE: 102 mmol/L (ref 101–111)
CO2: 24 mmol/L (ref 22–32)
Calcium: 8.7 mg/dL — ABNORMAL LOW (ref 8.9–10.3)
Creatinine, Ser: 0.72 mg/dL (ref 0.44–1.00)
GFR calc Af Amer: >60 mL/min (ref >60)
GFR calc non Af Amer: >60 mL/min (ref >60)
GLUCOSE: 161 mg/dL — AB (ref 65–99)
POTASSIUM: 4.4 mmol/L (ref 3.5–5.1)
Sodium: 135 mmol/L (ref 135–145)
TOTAL PROTEIN: 6 g/dL — AB (ref 6.5–8.1)
Total Bilirubin: 19.3 mg/dL (ref 0.3–1.2)

## 2017-09-16 LAB — GLUCOSE, CAPILLARY
GLUCOSE-CAPILLARY: 130 mg/dL — AB (ref 65–99)
GLUCOSE-CAPILLARY: 135 mg/dL — AB (ref 65–99)
GLUCOSE-CAPILLARY: 135 mg/dL — AB (ref 65–99)
GLUCOSE-CAPILLARY: 143 mg/dL — AB (ref 65–99)
GLUCOSE-CAPILLARY: 161 mg/dL — AB (ref 65–99)
Glucose-Capillary: 148 mg/dL — ABNORMAL HIGH (ref 65–99)
Glucose-Capillary: 151 mg/dL — ABNORMAL HIGH (ref 65–99)
Glucose-Capillary: 164 mg/dL — ABNORMAL HIGH (ref 65–99)

## 2017-09-16 LAB — PROTIME-INR
INR: 1.98
Prothrombin Time: 22.4 seconds — ABNORMAL HIGH (ref 11.4–15.2)

## 2017-09-16 LAB — CBC
HCT: 31.3 % — ABNORMAL LOW (ref 36.0–46.0)
Hemoglobin: 10.1 g/dL — ABNORMAL LOW (ref 12.0–15.0)
MCH: 31.4 pg (ref 26.0–34.0)
MCHC: 32.3 g/dL (ref 30.0–36.0)
MCV: 97.2 fL (ref 78.0–100.0)
PLATELETS: 33 10*3/uL — AB (ref 150–400)
RBC: 3.22 MIL/uL — ABNORMAL LOW (ref 3.87–5.11)
RDW: 22.4 % — AB (ref 11.5–15.5)
WBC: 16.5 10*3/uL — AB (ref 4.0–10.5)

## 2017-09-16 LAB — RENAL FUNCTION PANEL
Albumin: 3.4 g/dL — ABNORMAL LOW (ref 3.5–5.0)
Anion gap: 10 (ref 5–15)
BUN: 28 mg/dL — ABNORMAL HIGH (ref 6–20)
CALCIUM: 9.2 mg/dL (ref 8.9–10.3)
CO2: 25 mmol/L (ref 22–32)
CREATININE: 0.68 mg/dL (ref 0.44–1.00)
Chloride: 100 mmol/L — ABNORMAL LOW (ref 101–111)
GFR calc non Af Amer: >60 mL/min (ref >60)
Glucose, Bld: 173 mg/dL — ABNORMAL HIGH (ref 65–99)
Phosphorus: 3.5 mg/dL (ref 2.5–4.6)
Potassium: 4.7 mmol/L (ref 3.5–5.1)
SODIUM: 135 mmol/L (ref 135–145)

## 2017-09-16 LAB — DIFFERENTIAL
BASOS ABS: 0.2 10*3/uL — AB (ref 0.0–0.1)
BASOS PCT: 1 %
EOS ABS: 0.3 10*3/uL (ref 0.0–0.7)
Eosinophils Relative: 2 %
LYMPHS ABS: 1.3 10*3/uL (ref 0.7–4.0)
LYMPHS PCT: 8 %
MONO ABS: 0.3 10*3/uL (ref 0.1–1.0)
Monocytes Relative: 2 %
NEUTROS PCT: 87 %
NRBC: 118 /100{WBCs} — AB
Neutro Abs: 14.4 10*3/uL — ABNORMAL HIGH (ref 1.7–7.7)

## 2017-09-16 LAB — TRIGLYCERIDES: Triglycerides: 171 mg/dL — ABNORMAL HIGH (ref <150)

## 2017-09-16 LAB — PREALBUMIN: PREALBUMIN: 5 mg/dL — AB (ref 18–38)

## 2017-09-16 LAB — PHOSPHORUS: Phosphorus: 3.6 mg/dL (ref 2.5–4.6)

## 2017-09-16 LAB — MAGNESIUM: Magnesium: 2.3 mg/dL (ref 1.7–2.4)

## 2017-09-16 MED ORDER — ALBUMIN HUMAN 25 % IV SOLN
100.0000 g | Freq: Once | INTRAVENOUS | Status: AC
  Administered 2017-09-16: 100 g via INTRAVENOUS
  Filled 2017-09-16: qty 50
  Filled 2017-09-16: qty 400

## 2017-09-16 MED ORDER — DARBEPOETIN ALFA 150 MCG/0.3ML IJ SOSY
150.0000 ug | PREFILLED_SYRINGE | INTRAMUSCULAR | Status: DC
  Filled 2017-09-16 (×2): qty 0.3

## 2017-09-16 MED ORDER — DOXERCALCIFEROL 4 MCG/2ML IV SOLN
2.0000 ug | INTRAVENOUS | Status: DC
  Administered 2017-09-16 – 2017-09-18 (×2): 2 ug via INTRAVENOUS
  Filled 2017-09-16 (×5): qty 2

## 2017-09-16 MED ORDER — INSULIN ASPART 100 UNIT/ML ~~LOC~~ SOLN
0.0000 [IU] | Freq: Four times a day (QID) | SUBCUTANEOUS | Status: DC
  Administered 2017-09-16 – 2017-09-17 (×5): 2 [IU] via SUBCUTANEOUS
  Administered 2017-09-17: 3 [IU] via SUBCUTANEOUS
  Administered 2017-09-18: 2 [IU] via SUBCUTANEOUS
  Administered 2017-09-18: 3 [IU] via SUBCUTANEOUS

## 2017-09-16 MED ORDER — ZINC TRACE METAL 1 MG/ML IV SOLN
INTRAVENOUS | Status: AC
  Administered 2017-09-16: 17:00:00 via INTRAVENOUS
  Filled 2017-09-16: qty 760

## 2017-09-16 NOTE — Progress Notes (Signed)
Albumin is unavailable at this time. BP drops. Start Vaso, per MD

## 2017-09-16 NOTE — Progress Notes (Signed)
[facility] Progress Note  Subjective: no new changes, still on levo gtt  Vitals:   09/16/17 1315 09/16/17 1330 09/16/17 1345 09/16/17 1400  BP: (!) 100/43 (!) 105/49 (!) 102/48 (!) 98/47  Pulse:  79 74   Resp: 18 17 18 17   Temp:      TempSrc:      SpO2:  100% 100%   Weight:      Height:        Inpatient medications: . chlorhexidine gluconate (MEDLINE KIT)  15 mL Mouth Rinse BID  . Chlorhexidine Gluconate Cloth  6 each Topical Daily  . darbepoetin (ARANESP) injection - DIALYSIS  150 mcg Intravenous Q Mon-HD  . doxercalciferol  2 mcg Intravenous Q M,W,F-HD  . insulin aspart  0-9 Units Subcutaneous Q6H  . mouth rinse  15 mL Mouth Rinse Q4H  . pantoprazole sodium  40 mg Per Tube Daily  . sodium chloride flush  10-40 mL Intracatheter Q12H  . sodium chloride flush  5 mL Intracatheter Q8H   . sodium chloride 10 mL/hr at 09/16/17 1143  . albumin human    . feeding supplement (VITAL HIGH PROTEIN) Stopped (09/13/17 1025)  . fentaNYL infusion INTRAVENOUS 150 mcg/hr (09/16/17 1400)  . norepinephrine (LEVOPHED) Adult infusion 25 mcg/min (09/16/17 1403)  . dialysis replacement fluid (prismasate) 400 mL/hr at 09/16/17 1151  . dialysis replacement fluid (prismasate) 200 mL/hr at 09/16/17 1155  . dialysate (PRISMASATE) 1,600 mL/hr at 09/16/17 1122  . sodium chloride 999 mL/hr at 09/15/17 0119  . TPN ADULT (ION) 50 mL/hr at 09/16/17 1400  . TPN ADULT (ION)    . vasopressin (PITRESSIN) infusion - *FOR SHOCK* 0.03 Units/min (09/16/17 1405)   albuterol, alteplase, docusate, fentaNYL (SUBLIMAZE) injection, heparin, midazolam, sodium chloride  Exam: On vent, cachectic, not responsive, trach in place No jvd Chest coarse bilat RRR no mrg abd soft ntnd  abd drains in place Ext 1-2+ edema upper extremities nf not following commands lua avg w bruit   D[facility] 3h 65mn  61.5kg   2/2.25 bath p4  AVG  Heparin 4000 - hect 2 ug q hd iv - mircera 30 ug every 2 wks iv  at dialysis       Impression: 1  Acute cholecystitis - sp lap chole / bile leak 2  esrd on hd mwf getting crrt now 3  htn/ vol min vol excess, keeping even on crrt 4  ^^Tbili seen by GI has nodular liver by imaging, question cirrhosis 5  vdrf - sp trach now 6  Anemia ckd/ abl - recurrent gi bleeds here sp prbc's  7  Shock on levo gtt   Plan - as above   RKelly SplinterMD [facility] pager [phone]  09/16/2017, 2:18 PM   Recent Labs  Lab 09/15/17 0321 09/15/17 1535 09/16/17 0510  NA 136 135 135  K 3.9 4.3 4.4  CL 102 101 102  CO2 26 23 24   GLUCOSE 136* 190* 161*  BUN 28* 28* 28*  CREATININE 0.79 0.69 0.72  CALCIUM 8.8* 8.2* 8.7*  PHOS 2.7 3.1 3.6   Recent Labs  Lab 09/13/17 0325  09/14/17 0428 09/14/17 1527 09/15/17 1534 09/15/17 1535 09/16/17 0510  AST 384*  --  216*  --   --   --  142*  ALT 275*  --  172*  --   --   --  120*  ALKPHOS 485*  --  455*  --   --   --  432*  BILITOT 12.4*  --  16.8*  --  20.8*  --  19.3*  PROT 5.8*  --  6.4*  --   --   --  6.0*  ALBUMIN 1.9*   < > 2.1* 1.9*  --  1.7* 1.6*   < > = values in this interval not displayed.   Recent Labs  Lab 09/14/17 0428 09/15/17 0321 09/16/17 0510  WBC 12.4* 12.1* 16.5*  NEUTROABS  --   --  14.4*  HGB 10.5* 10.0* 10.1*  HCT 32.5* 31.2* 31.3*  MCV 91.8 94.0 97.2  PLT 89* 50* 33*   Iron/TIBC/Ferritin/ %Sat    Component Value Date/Time   IRON 84 04/15/2010 1030   TIBC 178 (L) 04/15/2010 1030   FERRITIN 784 (H) 04/15/2010 1030   IRONPCTSAT 47 04/15/2010 1030

## 2017-09-16 NOTE — Progress Notes (Signed)
RT note: patient currently weaning however MD orders were to decrease rate from 20 to 14 when placed back on full support.

## 2017-09-16 NOTE — Progress Notes (Signed)
[facility] Progress Note  6 Days Post-Op  Subjective: CC: sepsis Another small bloody BM this AM. S/p trach yesterday. Still requiring levophed. Family present at bedside.    Objective: Vital signs in last 24 hours: Temp:  [97.3 F (36.3 C)-97.6 F (36.4 C)] 97.4 F (36.3 C) (04/29 0350) Pulse Rate:  [75-108] 98 (04/29 0600) Resp:  [13-29] 20 (04/29 0700) BP: (73-112)/(45-69) 97/49 (04/29 0700) SpO2:  [83 %-100 %] 96 % (04/29 0700) FiO2 (%):  [30 %] 30 % (04/29 0610) Weight:  [62.6 kg (138 lb 0.1 oz)] 62.6 kg (138 lb 0.1 oz) (04/29 0500) Last BM Date: 09/14/17  Intake/Output from previous day: 04/28 0701 - 04/29 0700 In: 4198.3 [I.V.:3453.3; NG/GT:30; IV Piggyback:700] Out: 2979 [Emesis/NG output:455; Drains:75] Intake/Output this shift: No intake/output data recorded.  PE: Gen:  Sedated, on the vent Card:  Regular rate and rhythm Pulm:  Slightly tachypneic, ronchi bilaterally Abd: Soft, mildly distended, incisions C/D/I, drains with bilious/bloody output Ext: BL UE edematous Skin: warm and dry, no rashes   Lab Results:  Recent Labs    09/15/17 0321 09/16/17 0510  WBC 12.1* 16.5*  HGB 10.0* 10.1*  HCT 31.2* 31.3*  PLT 50* 33*   BMET Recent Labs    09/15/17 1535 09/16/17 0510  NA 135 135  K 4.3 4.4  CL 101 102  CO2 23 24  GLUCOSE 190* 161*  BUN 28* 28*  CREATININE 0.69 0.72  CALCIUM 8.2* 8.7*   PT/INR Recent Labs    09/15/17 0321 09/16/17 0510  LABPROT 20.9* 22.4*  INR 1.82 1.98   CMP     Component Value Date/Time   NA 135 09/16/2017 0510   NA 142 02/12/2017 1400   K 4.4 09/16/2017 0510   K 3.9 02/12/2017 1400   CL 102 09/16/2017 0510   CO2 24 09/16/2017 0510   CO2 30 (H) 02/12/2017 1400   GLUCOSE 161 (H) 09/16/2017 0510   GLUCOSE 88 02/12/2017 1400   BUN 28 (H) 09/16/2017 0510   BUN 16.9 02/12/2017 1400   CREATININE 0.72 09/16/2017 0510   CREATININE 6.2 (HH) 02/12/2017 1400   CALCIUM 8.7 (L) 09/16/2017 0510    CALCIUM 9.2 02/12/2017 1400   PROT 6.0 (L) 09/16/2017 0510   PROT 6.8 02/12/2017 1400   ALBUMIN 1.6 (L) 09/16/2017 0510   ALBUMIN 3.3 (L) 02/12/2017 1400   AST 142 (H) 09/16/2017 0510   AST 13 02/12/2017 1400   ALT 120 (H) 09/16/2017 0510   ALT 7 02/12/2017 1400   ALKPHOS 432 (H) 09/16/2017 0510   ALKPHOS 75 02/12/2017 1400   BILITOT 19.3 (HH) 09/16/2017 0510   BILITOT 0.68 02/12/2017 1400   GFRNONAA >60 09/16/2017 0510   GFRAA >60 09/16/2017 0510   Lipase     Component Value Date/Time   LIPASE 36 08/28/2017 1618       Studies/Results: Dg Chest Port 1 View  Result Date: 09/16/2017 CLINICAL DATA:  Tracheostomy. EXAM: PORTABLE CHEST 1 VIEW COMPARISON:  Radiograph of September 15, 2017. FINDINGS: The heart size and mediastinal contours are within normal limits. Atherosclerosis of thoracic aorta is noted. Tracheostomy tube is in grossly good position. Right internal jugular catheter is again noted with tip in expected position of the SVC. Nasogastric tube is seen entering the stomach. No pneumothorax is noted. Minimal bibasilar subsegmental atelectasis is noted. The visualized skeletal structures are unremarkable. IMPRESSION: Tracheostomy tube in good position. Stable minimal bibasilar subsegmental atelectasis. Aortic Atherosclerosis (ICD10-I70.0). Electronically Signed   By: Jeneen Rinks  Murlean Caller, M.D.   On: 09/16/2017 07:32   Dg Chest Port 1 View  Result Date: 09/15/2017 CLINICAL DATA:  Tracheostomy status EXAM: PORTABLE CHEST 1 VIEW COMPARISON:  09/15/2017 600 hours FINDINGS: The endotracheal tube has been exchanged for a tracheostomy tube. The tip is 5.1 cm from the carina. Low lung volumes. Normal heart size. Bibasilar atelectasis increased. Bilateral nephroureteral catheter is in place. Stable right jugular central venous catheter. NG tube removed. IMPRESSION: Tracheostomy tube is in place. Bibasilar atelectasis increased Electronically Signed   By: Marybelle Killings M.D.   On: 09/15/2017 12:58    Dg Chest Port 1 View  Result Date: 09/15/2017 CLINICAL DATA:  Endotracheal tube present.  Respiratory failure. EXAM: PORTABLE CHEST 1 VIEW COMPARISON:  09/14/2017 FINDINGS: Endotracheal tube tip has been advanced, now 4.1 cm above the carina. Right jugular central venous catheter and nasogastric tube remain in appropriate position. Heart size is within normal limits. Aortic atherosclerosis. Stable subsegmental atelectasis in right lung base. No evidence of pulmonary consolidation or pleural effusion. IMPRESSION: Endotracheal tube in appropriate position. Stable mild right basilar atelectasis. Electronically Signed   By: Earle Gell M.D.   On: 09/15/2017 09:53   Dg Abd Portable 1v  Result Date: 09/15/2017 CLINICAL DATA:  Feeding tube placement. EXAM: PORTABLE ABDOMEN - 1 VIEW COMPARISON:  CT, 09/08/2017. FINDINGS: Nasogastric tube passes well below the diaphragm. Tip projects in the distal stomach or first portion of the duodenum. Percutaneous pigtail catheters are noted, 1 projecting in the epigastric region the other in the right upper quadrant along the inferior margin of the liver. Biliary stent is noted. IMPRESSION: Well-positioned nasogastric tube. Electronically Signed   By: Lajean Manes M.D.   On: 09/15/2017 18:11    Anti-infectives: Anti-infectives (From admission, onward)   Start     Dose/Rate Route Frequency Ordered Stop   09/08/17 2000  piperacillin-tazobactam (ZOSYN) IVPB 3.375 g     3.375 g 100 mL/hr over 30 Minutes Intravenous Every 6 hours 09/08/17 1946     09/05/17 2000  piperacillin-tazobactam (ZOSYN) IVPB 3.375 g     3.375 g 12.5 mL/hr over 240 Minutes Intravenous Every 12 hours 09/05/17 1126 09/07/17 0154   08/31/17 0800  piperacillin-tazobactam (ZOSYN) IVPB 3.375 g  Status:  Discontinued     3.375 g 100 mL/hr over 30 Minutes Intravenous Every 6 hours 08/31/17 0739 09/05/17 1126   08/30/17 2000  Ampicillin-Sulbactam (UNASYN) 3 g in sodium chloride 0.9 % 100 mL IVPB   Status:  Discontinued     3 g 200 mL/hr over 30 Minutes Intravenous Every 24 hours 09/01/2017 0953 08/30/17 0945   08/30/17 1230  piperacillin-tazobactam (ZOSYN) IVPB 3.375 g  Status:  Discontinued     3.375 g 12.5 mL/hr over 240 Minutes Intravenous Every 12 hours 08/30/17 1134 08/31/17 0738   09/14/2017 1030  Ampicillin-Sulbactam (UNASYN) 3 g in sodium chloride 0.9 % 100 mL IVPB     3 g 200 mL/hr over 30 Minutes Intravenous  Once 08/22/2017 0953 09/12/2017 1246   08/26/2017 0000  ampicillin-sulbactam (UNASYN) 1.5 g in sodium chloride 0.9 % 100 mL IVPB  Status:  Discontinued     1.5 g 200 mL/hr over 30 Minutes Intravenous Once 08/28/17 1045 09/05/2017 0928   09/02/2017 2200  piperacillin-tazobactam (ZOSYN) IVPB 3.375 g    Note to Pharmacy:  Zosyn 3.375 g IV q12h in ESRD on HD   3.375 g 12.5 mL/hr over 240 Minutes Intravenous Every 12 hours 09/11/2017 1155 08/25/17 1335   08/22/17 1000  piperacillin-tazobactam (ZOSYN) IVPB 3.375 g  Status:  Discontinued    Note to Pharmacy:  Zosyn 3.375 g IV q12h in ESRD on HD   3.375 g 12.5 mL/hr over 240 Minutes Intravenous Every 12 hours 08/22/17 0128 09/13/2017 1155   08/22/17 0045  piperacillin-tazobactam (ZOSYN) IVPB 3.375 g     3.375 g 12.5 mL/hr over 240 Minutes Intravenous  Once 08/22/17 0038 08/22/17 0145       Assessment/Plan HTN ESRD - HD MWF H/o breast cancer  Acute cholecystitis s/p lap chole4/6/19 by Dr. Redmond Pulling - CT scan 4/8 w/ small amount fluid around liver and layering in pelvis ; HIDA 4/9 positive for bile leak - CT 4/21 with multiple fluid collections -additional drain placed 4/24 with good output -ERCP 4/23 w stent placement (Dr Fuller Plan) ?Shock liver -Total bilirubin 19.3, direct bili 14.5 last night, indirect 6.3 last night - ?reconsult GI to weigh in - AST/ALT down  VDRF secondary to aspiration/PNA-CCMfollowing - appreciate assistance.  - s/p trach 4/28  Leukocytosis - Likely secondary to aspiration PNA and bile leak On  Zosyn.  ABL Anemia+ chronic disease- Hgb 10.1, stable  Lactic acidosis- improvedon bicarb drip and CVVH, appreciate CCM assistance  ID -zosyn 4/4>>4/7,Unasyn 4/11-->4/12, Zosyn 4/12 --> FEN -NPO/OGT-ileus secondary to bile peritonitis, NG to suction VTE -SCDs Foley -none, anuric Follow up -Dr. Redmond Pulling    LOS: 25 days    Brigid Re , [facility] 09/16/2017, 7:48 AM Pager: [phone] Consults: [phone] Mon-Fri 7:00 am-4:30 pm Sat-Sun 7:00 am-11:30 am

## 2017-09-16 NOTE — Progress Notes (Addendum)
Daily Rounding Note  09/16/2017, 10:52 AM  LOS: 25 days   SUBJECTIVE:   CCM and general surgery asking GI to re-evaluate this patient because her total bilirubin has not improved that much.  Very complicated patient with end-stage renal disease on dialysis prior to admission. Underwent laparoscopic cholecystectomy 09/08/2017.  Developed postop bile leak at the cystic duct confirmed by HIDA on 4/14.  Developed aspiration pneumonia, vent requiring respiratory failure, septic shock.  On 08/30/2017 IR placed drain to the biloma.  For several days, she was felt to unstable to undergo ERCP but on 09/15/2017 Dr Fuller Plan performed ERCP.  This confirmed bile duct leak at the cystic duct along with incidental erosive gastritis and mucosal lesions from NG tube suctioning trauma.  Cholangiogram suspicious for sludge in the mildly dilated CBD.  Dr. Fuller Plan placed plastic stent into CBD.  09/11/2017 another drain placed by IR for biloma drainage.   Patient underwent trach placement on 4/28.  Patient has sloughing of skin in various parts of the body.  She remains on vent, vasopressors, CRRT.  Transfusion with total of 7  red cells for anemia.  Was on Heparin for DVT of left IJ vein on 4/17, but ultimately discontinued.     On 4/20 she had some bloody stools as well as some bloody NG tube output. RN noted blood stool yesterday and orange colored stool today.  Blood noted in the biliary drains in past weeks. .   Remains on Levophed, Fentanyl.     Transaminases steadily improving but they are not normal yet.  Alk phosphatase also improved.  Total bilirubin has actually increased.  Dr Georgette Dover feels that the hyperbilirubinemia may be secondary to TNA. Biliary drains ouput of 380, 140, 75 ml in previous 3 days.   WBCs are again rising, though they never normalized.  Thrombocytopenia continues to worsen over the last week.  Most recent non-contrast CT of the abdomen  pelvis 4/21.  Showed increase in perihepatic, perisplenic fluid collections with multiple new intraperitoneal fluid collections compatible with abscess.  Family still clinging on and pt remains full code.     OBJECTIVE:         Vital signs in last 24 hours:    Temp:  [96 F (35.6 C)-97.6 F (36.4 C)] 96 F (35.6 C) (04/29 0826) Pulse Rate:  [75-108] 75 (04/29 1015) Resp:  [13-32] 32 (04/29 1045) BP: (73-112)/(45-69) 97/49 (04/29 1045) SpO2:  [83 %-100 %] 98 % (04/29 1015) FiO2 (%):  [30 %] 30 % (04/29 1015) Weight:  [138 lb 0.1 oz (62.6 kg)] 138 lb 0.1 oz (62.6 kg) (04/29 0500) Last BM Date: 09/14/17 Filed Weights   09/14/17 0500 09/15/17 0400 09/16/17 0500  Weight: 139 lb 15.9 oz (63.5 kg) 140 lb 10.5 oz (63.8 kg) 138 lb 0.1 oz (62.6 kg)   General: thin, severely acutely ill looking AAF.  Sedated on vent, icteric.  Heart: RRR Chest: clear in front.  On vent, trach in place Abdomen: soft, NT, hypoactive BS.  2 biliary drains with minor abount of fluid, 1 looks more bloody than bilious, the other dark bilious.  Extremities: edema in UE.  AV graft in left UE.  HD catheter in right groin.   Skin: dressings cover desquamated tissue on face, there in a flat, dark blister on her left great toe.   Neuro/Psych:  Unresponsive during exam, made no vigorous attempt to wake her up.  Staring blanky, eyes open.  Intake/Output from previous day: 04/28 0701 - 04/29 0700 In: 4198.3 [I.V.:3453.3; NG/GT:30; IV Piggyback:700] Out: 3320 [Emesis/NG output:455; Drains:75]  Intake/Output this shift: Total I/O In: 269.2 [I.V.:269.2] Out: 346 [Emesis/NG output:50; Other:296]  Lab Results: Recent Labs    09/14/17 0428 09/15/17 0321 09/16/17 0510  WBC 12.4* 12.1* 16.5*  HGB 10.5* 10.0* 10.1*  HCT 32.5* 31.2* 31.3*  PLT 89* 50* 33*   BMET Recent Labs    09/15/17 0321 09/15/17 1535 09/16/17 0510  NA 136 135 135  K 3.9 4.3 4.4  CL 102 101 102  CO2 26 23 24   GLUCOSE 136* 190* 161*    BUN 28* 28* 28*  CREATININE 0.79 0.69 0.72  CALCIUM 8.8* 8.2* 8.7*   LFT Recent Labs    09/14/17 0428 09/14/17 1527 09/15/17 1534 09/15/17 1535 09/16/17 0510  PROT 6.4*  --   --   --  6.0*  ALBUMIN 2.1* 1.9*  --  1.7* 1.6*  AST 216*  --   --   --  142*  ALT 172*  --   --   --  120*  ALKPHOS 455*  --   --   --  432*  BILITOT 16.8*  --  20.8*  --  19.3*  BILIDIR  --   --  14.5*  --   --   IBILI  --   --  6.3*  --   --    PT/INR Recent Labs    09/15/17 0321 09/16/17 0510  LABPROT 20.9* 22.4*  INR 1.82 1.98    Studies/Results: Dg Chest Port 1 View  Result Date: 09/16/2017 CLINICAL DATA:  Tracheostomy. EXAM: PORTABLE CHEST 1 VIEW COMPARISON:  Radiograph of September 15, 2017. FINDINGS: The heart size and mediastinal contours are within normal limits. Atherosclerosis of thoracic aorta is noted. Tracheostomy tube is in grossly good position. Right internal jugular catheter is again noted with tip in expected position of the SVC. Nasogastric tube is seen entering the stomach. No pneumothorax is noted. Minimal bibasilar subsegmental atelectasis is noted. The visualized skeletal structures are unremarkable. IMPRESSION: Tracheostomy tube in good position. Stable minimal bibasilar subsegmental atelectasis. Aortic Atherosclerosis (ICD10-I70.0). Electronically Signed   By: Marijo Conception, M.D.   On: 09/16/2017 07:32   Dg Chest Port 1 View  Result Date: 09/15/2017 CLINICAL DATA:  Tracheostomy status EXAM: PORTABLE CHEST 1 VIEW COMPARISON:  09/15/2017 600 hours FINDINGS: The endotracheal tube has been exchanged for a tracheostomy tube. The tip is 5.1 cm from the carina. Low lung volumes. Normal heart size. Bibasilar atelectasis increased. Bilateral nephroureteral catheter is in place. Stable right jugular central venous catheter. NG tube removed. IMPRESSION: Tracheostomy tube is in place. Bibasilar atelectasis increased Electronically Signed   By: Marybelle Killings M.D.   On: 09/15/2017 12:58   Dg  Chest Port 1 View  Result Date: 09/15/2017 CLINICAL DATA:  Endotracheal tube present.  Respiratory failure. EXAM: PORTABLE CHEST 1 VIEW COMPARISON:  09/14/2017 FINDINGS: Endotracheal tube tip has been advanced, now 4.1 cm above the carina. Right jugular central venous catheter and nasogastric tube remain in appropriate position. Heart size is within normal limits. Aortic atherosclerosis. Stable subsegmental atelectasis in right lung base. No evidence of pulmonary consolidation or pleural effusion. IMPRESSION: Endotracheal tube in appropriate position. Stable mild right basilar atelectasis. Electronically Signed   By: Earle Gell M.D.   On: 09/15/2017 09:53   Dg Abd Portable 1v  Result Date: 09/15/2017 CLINICAL DATA:  Feeding tube placement. EXAM: PORTABLE ABDOMEN - 1 VIEW  COMPARISON:  CT, 09/08/2017. FINDINGS: Nasogastric tube passes well below the diaphragm. Tip projects in the distal stomach or first portion of the duodenum. Percutaneous pigtail catheters are noted, 1 projecting in the epigastric region the other in the right upper quadrant along the inferior margin of the liver. Biliary stent is noted. IMPRESSION: Well-positioned nasogastric tube. Electronically Signed   By: Lajean Manes M.D.   On: 09/15/2017 18:11    ASSESMENT:   *   t bili still elevated and o/w LFTs improving in pt with post op bile leak.    *   Anemia.  Requiring multiple PRBCs.  Intermittent bloody stools, some bloody output into biliary drains.  Suspect she could have ischemic colitis.    *  Thrombocytopenia, contributes to bleeding tendency.    *  Vent dep resp failure.  Trach on 4/28.    *   ESRD   PLAN   *   ? timiing of next CT to eval biloma?      *  Continue enteric Protonix.     Azucena Freed  09/16/2017, 10:52 AM Phone [place] Attending   I have taken an interval history, reviewed the chart and examined the patient. I agree with the Advanced Practitioner's note, impression  and recommendations.   She remains critically ill and has multi-system organ failure.  Her jaundice is likely multifactorial and related to TPN and prior sepsis, hypotension (? Some shock liver). Still on presors. CT shows nodular liver, has thrombocytopenia, ? Underlying cirrhosis also.  Drain outputs way down indicating that biliary stent is treating bile leak.  Repeat cross sectional imaging can be considered not sure it will change things - defer to surgery and IR on this now.  Gatha Mayer, MD, [facility] 09/16/2017 12:35 PM

## 2017-09-16 NOTE — Progress Notes (Signed)
Waiting on medication. If Albumin will not help with BP, will start Vaso, per MD.

## 2017-09-16 NOTE — Progress Notes (Signed)
Referring Physician(s): Dr. Kieth Brightly  Supervising Physician: Markus Daft  Patient Status:  [facility] - In-pt  Chief Complaint: Bile leak s/p cholecystectomy 09/17/2017  Subjective: Patient underwent ERCP 4/23 with stent placement in the CBD.  Also s/p second biloma drain placement 09/11/17 by Dr. Barbie Banner.   Patient on vent, vasopressors, CRRT.  She has required blood transfusions.  Her Tbili remains elevated at 19.3.  Allergies: Erythromycin and Vicodin [hydrocodone-acetaminophen]  Medications: Prior to Admission medications   Medication Sig Start Date End Date Taking? Authorizing Provider  amLODipine (NORVASC) 10 MG tablet Take 10 mg by mouth every evening.  03/31/13  Yes [provider]  calcium acetate (PHOSLO) 667 MG capsule Take 2 capsules (1,334 mg total) by mouth 3 (three) times daily with meals. 09/15/15  Yes Dana Allan I, MD  cloNIDine (CATAPRES) 0.1 MG tablet Take 0.1 mg by mouth every evening.  03/24/13  Yes [provider]  fluticasone (FLONASE) 50 MCG/ACT nasal spray Place 2 sprays into both nostrils daily. 09/15/15  Yes Dana Allan I, MD  hydrALAZINE (APRESOLINE) 50 MG tablet Take 50 mg by mouth every evening.  04/14/13  Yes [provider]  multivitamin (RENA-VIT) TABS tablet Take 1 tablet by mouth at bedtime. 09/15/15  Yes Bonnell Public, MD  tamoxifen (NOLVADEX) 20 MG tablet Take 1 tablet (20 mg total) by mouth daily. Patient taking differently: Take 20 mg by mouth every evening.  02/12/17  Yes Causey, Charlestine Massed, NP  hydrOXYzine (ATARAX/VISTARIL) 25 MG tablet Take 1 tablet (25 mg total) by mouth every 8 (eight) hours as needed for itching. Patient not taking: Reported on 08/22/2017 09/15/15   Dana Allan I, MD     Vital Signs: BP (!) 134/56   Pulse 78   Temp (!) 97.2 F (36.2 C) (Axillary)   Resp 18   Ht 5\' 2"  (1.575 m)   Wt 138 lb 0.1 oz (62.6 kg)   SpO2 100%   BMI 25.24 kg/m   Physical Exam  Constitutional:  She appears well-developed.  Nursing note and vitals reviewed. Abdomen:  Lateral biloma drain in place.  Insertion site c/d/i.  Minimal output over the past 24-48 hrs.  Midline biloma drain in place.  Insertion site c/d/i.  Output is decreasing.  65 mL recorded yesterday.  ~50 mL dark, orange bilious output in collection bag.   Imaging: Dg Chest Port 1 View  Result Date: 09/16/2017 CLINICAL DATA:  Tracheostomy. EXAM: PORTABLE CHEST 1 VIEW COMPARISON:  Radiograph of September 15, 2017. FINDINGS: The heart size and mediastinal contours are within normal limits. Atherosclerosis of thoracic aorta is noted. Tracheostomy tube is in grossly good position. Right internal jugular catheter is again noted with tip in expected position of the SVC. Nasogastric tube is seen entering the stomach. No pneumothorax is noted. Minimal bibasilar subsegmental atelectasis is noted. The visualized skeletal structures are unremarkable. IMPRESSION: Tracheostomy tube in good position. Stable minimal bibasilar subsegmental atelectasis. Aortic Atherosclerosis (ICD10-I70.0). Electronically Signed   By: Marijo Conception, M.D.   On: 09/16/2017 07:32   Dg Chest Port 1 View  Result Date: 09/15/2017 CLINICAL DATA:  Tracheostomy status EXAM: PORTABLE CHEST 1 VIEW COMPARISON:  09/15/2017 600 hours FINDINGS: The endotracheal tube has been exchanged for a tracheostomy tube. The tip is 5.1 cm from the carina. Low lung volumes. Normal heart size. Bibasilar atelectasis increased. Bilateral nephroureteral catheter is in place. Stable right jugular central venous catheter. NG tube removed. IMPRESSION: Tracheostomy tube is in place. Bibasilar atelectasis increased  Electronically Signed   By: Marybelle Killings M.D.   On: 09/15/2017 12:58   Dg Chest Port 1 View  Result Date: 09/15/2017 CLINICAL DATA:  Endotracheal tube present.  Respiratory failure. EXAM: PORTABLE CHEST 1 VIEW COMPARISON:  09/14/2017 FINDINGS: Endotracheal tube tip has been advanced, now  4.1 cm above the carina. Right jugular central venous catheter and nasogastric tube remain in appropriate position. Heart size is within normal limits. Aortic atherosclerosis. Stable subsegmental atelectasis in right lung base. No evidence of pulmonary consolidation or pleural effusion. IMPRESSION: Endotracheal tube in appropriate position. Stable mild right basilar atelectasis. Electronically Signed   By: Earle Gell M.D.   On: 09/15/2017 09:53   Dg Chest Port 1 View  Result Date: 09/14/2017 CLINICAL DATA:  84 year old female status post intubation EXAM: PORTABLE CHEST 1 VIEW COMPARISON:  Prior chest x-ray 09/13/2017 FINDINGS: The patient remains intubated. The tip of the endotracheal tube is at the level of the clavicles approximately 7 cm above the carina. A nasogastric tube is present. The tip lies off the field of view, presumably in the stomach. A right IJ central venous catheter remains present. The tip overlies the distal SVC. A pigtail drainage catheter projects over the left upper quadrant. Slightly improved aeration with decreasing atelectasis in the lung bases. Bilateral interstitial airspace opacities persist. Residual linear atelectasis versus scarring in the right middle lobe. No pulmonary edema, pneumothorax or pleural effusion. No new airspace opacity. IMPRESSION: 1. The tip of the endotracheal tube is now 7 cm above the carina. 2. Other support apparatus in stable and satisfactory position. 3. Improved aeration overall secondary to decreasing bibasilar atelectasis. 4. No new acute cardiopulmonary process. Electronically Signed   By: Jacqulynn Cadet M.D.   On: 09/14/2017 09:10   Dg Chest Port 1 View  Result Date: 09/13/2017 CLINICAL DATA:  Airway intubation. EXAM: PORTABLE CHEST 1 VIEW COMPARISON:  09/12/2017. FINDINGS: ETT 2.8 cm above carina. Nasogastric tube is in the stomach. Low lung volumes. Cardiomegaly. Perihilar opacities appear improved. IMPRESSION: Support tubes and apparatus  are stable. Slight improvement aeration. Electronically Signed   By: Staci Righter M.D.   On: 09/13/2017 09:00   Dg Abd Portable 1v  Result Date: 09/15/2017 CLINICAL DATA:  Feeding tube placement. EXAM: PORTABLE ABDOMEN - 1 VIEW COMPARISON:  CT, 09/08/2017. FINDINGS: Nasogastric tube passes well below the diaphragm. Tip projects in the distal stomach or first portion of the duodenum. Percutaneous pigtail catheters are noted, 1 projecting in the epigastric region the other in the right upper quadrant along the inferior margin of the liver. Biliary stent is noted. IMPRESSION: Well-positioned nasogastric tube. Electronically Signed   By: Lajean Manes M.D.   On: 09/15/2017 18:11    Labs:  CBC: Recent Labs    09/13/17 1654 09/13/17 2130 09/14/17 0428 09/15/17 0321 09/16/17 0510  WBC 15.1*  --  12.4* 12.1* 16.5*  HGB 7.4* 10.0* 10.5* 10.0* 10.1*  HCT 23.6* 30.5* 32.5* 31.2* 31.3*  PLT 130*  --  89* 50* 33*    COAGS: Recent Labs    09/05/17 2004 09/06/17 0011 09/06/17 0515 09/09/17 0400  09/13/17 0325 09/14/17 0428 09/15/17 0321 09/16/17 0510  INR  --   --   --   --    < > 2.06 1.69 1.82 1.98  APTT 82* 82* 77* 53*  --   --   --   --   --    < > = values in this interval not displayed.    BMP: Recent  Labs    09/14/17 1527 09/15/17 0321 09/15/17 1535 09/16/17 0510  NA 134* 136 135 135  K 4.0 3.9 4.3 4.4  CL 101 102 101 102  CO2 25 26 23 24   GLUCOSE 160* 136* 190* 161*  BUN 25* 28* 28* 28*  CALCIUM 8.6* 8.8* 8.2* 8.7*  CREATININE 0.75 0.79 0.69 0.72  GFRNONAA >60 >60 >60 >60  GFRAA >60 >60 >60 >60    LIVER FUNCTION TESTS: Recent Labs    09/12/17 0421  09/13/17 0325  09/14/17 0428 09/14/17 1527 09/15/17 1534 09/15/17 1535 09/16/17 0510  BILITOT 12.0*  --  12.4*  --  16.8*  --  20.8*  --  19.3*  AST 723*  --  384*  --  216*  --   --   --  142*  ALT 359*  --  275*  --  172*  --   --   --  120*  ALKPHOS 481*  --  485*  --  455*  --   --   --  432*  PROT 5.6*   --  5.8*  --  6.4*  --   --   --  6.0*  ALBUMIN 2.0*   < > 1.9*   < > 2.1* 1.9*  --  1.7* 1.6*   < > = values in this interval not displayed.    Assessment and Plan: Cholecystectomy 08/23/2017, susdequent bile leak S/p biloma drain placement 4/11 and again on 4/24 Patient remains intubated.  Now s/p trach placement.  Tbili remains elevated at 19.3K.  GI re-consulted by surgery-- shock liver vs. Cirrhosis vs. TPN.  Lateral biloma drain with minimal output.  Midline drain with 50-75 mL daily output over the past few days.  Continue drains for now.  IR following.  Electronically Signed: Docia Barrier, PA 09/16/2017, 3:25 PM   I spent a total of 15 Minutes at the the patient's bedside AND on the patient's hospital floor or unit, greater than 50% of which was counseling/coordinating care for biloma.

## 2017-09-16 NOTE — Progress Notes (Signed)
PULMONARY / CRITICAL CARE MEDICINE   Name: Pam Fuller MRN: [id number] DOB: 05/24/1932    ADMISSION DATE:  09/14/2017 CONSULTATION DATE:  4/11  REFERRING MD:  Dr. Maudie Mercury  CHIEF COMPLAINT:  Aspiration  BRIEF SUMMARY:   [name] is a 84 year old female with ESRD on MWF HD, HTN, Anemia, and hx of breast cancer who presented to the ED on 4/4 with RLQ abdominal pain. She was found to have acute cholecystitis with a gallstone lodged in the gallbladder neck. CBD measured up to 10 mm. She underwent laparoscopic cholecystectomy on 4/6. She initially did well but on 4/8 was noted to have worsened pain and confusion and thought to have post-op ileus. CT abd/pelvis 4/8 showed small-mod fluid in the abdominal pelvis, read as normal post op changes but could not exclude a bile leak. A hepatobiliary scan was obtained on 4/9 which did suggest a bile leak. Surgery recommended IR drain and GI consult for ERCP. She went to the Endoscopy unit on 4/11 AM for ERCP however during prep with anesthesia she had significant bilious emesis with hypotension requiring intubation and pressor support. ERCP was not attempted. She was subsequently transferred to the medical ICU.   STUDIES:  CT abd/pelv 4/4 >> acute cholecystitis, gallstone in GB neck, CBD 10 mm  CT Head: no acute changes, chronic small vessel ischemic changes  CT abd/pelvis 4/8 >> small-mod fluid in abd/pelvis  Hepatobiliary scan 4/9 >> bile leak  TTE 4/12 >> EF 65-70%, G1DD, narrow LVOT, mild TR, mild pHTN  U/S Abdomen 4/14 >> small fluid near cholecystectomy site  Hepatobiliary scan 4/14 >> Rt subhepatic fluid collection consistent w/ biloma; increased in size from prior  CT abd/pelvis w/ oral contrast 4/16 >> drainage catheter at inferior liver margin with decreased perihepatic fluid; low-density fluid at left subdiaphragmatic area, questionable colonic wall thickening throughout, multifocal airspace disease of lung bases  CT  abdomen.pelvis 4/21: interval growth of large perihepatic and perisplenic fluid collection with multiple, new intraperitoneal fluid collections. Resolving multifocal pneumonia.   CULTURES: Tracheal aspirate 4/11 >> no growth Abd fluid 4/11 >> no growth 5 days Abd fluid 4/15 >> no growth 3 days  ANTIBIOTICS: Zosyn 4/3 >> 4/7 Unasyn 4/11 Zosyn 4/12 - 4/19 Zosyn 4/21-  LINES/TUBES: ETT 4/11>4/28 OGT 4/11>4/28 PIV RUE 4/9 RLQ Biliary Drain 4/11 HD Cath LIJ 4/12 >> 4/19 L femoral trialysis catheter 4/21> RIJ cath 4/23>  Tracheostomy 4/28>  SIGNIFICANT EVENTS: 4/4 - admitted w/ acute cholecystitis 4/6 - lap chole 4/9 - bile leak seen on hepatobiliary scan 4/11 - Aspirated during prep for ERCP, intubated and transferred to ICU 4/11 - Rt abd fluid drain placed by IR 4/12- Heparin gtt started for NSTEMI; held after OG bleeding and trop peak 4/13 - worsening liver enzyymes. ESRD - Anuric. CRRT started. On levophed 82mcg and fent 177mcg 4/14 - Hgb 6.0, transfused 2 units PRBCs 4/17 - DVT LUE - started bivalirudin for suspected HIT 4/18 - CVVH d/c'ed, return to intermittent HD.  4/19 - Restarted HD. HIT negative  4/20 - Bleeding from GB drain, OGT, and stools. Received 1u pRBC. Heparin gtt held. 4/21 - Multiple, new fluid collections found on CT abd/pelvis   4/23 - ERCP with cystic duct bile leak s/p stent placement, erosive gastritis, and CBD dilation  4/24 - Mid-abdomen perc drain placed by IR  4/26 - Coffee-ground emesis after restarting tube feeds  4/28 - Tracheostomy   SUBJECTIVE/OVERNIGHT/INTERVAL HX -  No acute events overnight. BP remains  soft on levophed. Does not arouse to voice or follows commands.     VITAL SIGNS: BP (!) 96/49 (BP Location: Right Arm)   Pulse 98   Temp (!) 97.4 F (36.3 C) (Oral)   Resp (!) 21   Ht 5\' 2"  (1.575 m)   Wt 138 lb 0.1 oz (62.6 kg)   SpO2 97%   BMI 25.24 kg/m   HEMODYNAMICS: CVP:  [0 mmHg] 0 mmHg  VENTILATOR SETTINGS: Vent Mode:  PRVC FiO2 (%):  [30 %] 30 % Set Rate:  [20 bmp] 20 bmp Vt Set:  [400 mL] 400 mL PEEP:  [5 cmH20] 5 cmH20 Plateau Pressure:  [20 cmH20-26 cmH20] 20 cmH20  INTAKE / OUTPUT: I/O last 3 completed shifts: In: 6167.4 [I.V.:4217.4; Other:20; NG/GT:130; IV Piggyback:1800] Out: 7824 [Emesis/NG output:675; Drains:150; MPNTI:1443]  PHYSICAL EXAMINATION:  General: Chronically ill appearing female, sedated, NAD  HEENT: [place]/AT, scleral icterus present, PERRL, ET and OG tubes in place  Cardiac:  RRR, Nl S1/S2, no mrg  Pulm: Coarse breath sounds diffusely Abd: Soft, NT, ND and decreased bowel sounds  Ext: LUE edema L>>R worse than yesterday bilaterally, L AVF with palpable thrill, femoral trialysis cathter in place with dressing c/d/i  Neuro: sedated, does not follow commands, moves all 4 extremities spontaneously but no purposefully    PULMONARY Recent Labs  Lab 09/09/17 0952 09/12/2017 1840  PHART 7.330* 7.335*  PCO2ART 28.4* 42.6  PO2ART 118.0* 95.0  HCO3 15.1* 22.1  TCO2 16*  --   O2SAT 99.0 97.5   CBC Recent Labs  Lab 09/14/17 0428 09/15/17 0321 09/16/17 0510  HGB 10.5* 10.0* 10.1*  HCT 32.5* 31.2* 31.3*  WBC 12.4* 12.1* PENDING  PLT 89* 50* 33*   COAGULATION Recent Labs  Lab 09/12/17 0421 09/13/17 0325 09/14/17 0428 09/15/17 0321 09/16/17 0510  INR 2.27 2.06 1.69 1.82 1.98   CARDIAC   Recent Labs  Lab 09/09/17 1011  TROPONINI 0.49*   No results for input(s): PROBNP in the last 168 hours.  CHEMISTRY Recent Labs  Lab 09/12/17 1602 09/13/17 0325  09/14/17 0428 09/14/17 1527 09/15/17 0321 09/15/17 1535 09/16/17 0510  NA 134* 137   < > 138 134* 136 135 135  K 4.9 4.4   < > 4.1 4.0 3.9 4.3 4.4  CL 102 106   < > 101 101 102 101 102  CO2 20* 24   < > 27 25 26 23 24   GLUCOSE 170* 182*   < > 172* 160* 136* 190* 161*  BUN 12 12   < > 24* 25* 28* 28* 28*  CREATININE 0.92 0.73   < > 0.87 0.75 0.79 0.69 0.72  CALCIUM 8.4* 8.6*   < > 9.1 8.6* 8.8* 8.2* 8.7*  MG  2.3 2.2  --  2.6*  --  2.4  --  2.3  PHOS 2.9 1.5*   < > 2.8 2.6 2.7 3.1 3.6   < > = values in this interval not displayed.   Estimated Creatinine Clearance: 47.2 mL/min (by C-G formula based on SCr of 0.72 mg/dL).  I/O last 3 completed shifts: In: 6167.4 [I.V.:4217.4; Other:20; NG/GT:130; IV Piggyback:1800] Out: 1540 [Emesis/NG output:675; Drains:150; GQQPY:1950]  Intake/Output Summary (Last 24 hours) at 09/16/2017 0657 Last data filed at 09/16/2017 0635 Gross per 24 hour  Intake 4192.27 ml  Output 3318 ml  Net 874.27 ml   LIVER Recent Labs  Lab 09/11/17 0450  09/12/17 0421  09/13/17 0325 09/13/17 1650 09/14/17 0428 09/14/17 1527 09/15/17 0321 09/15/17  1534 09/15/17 1535 09/16/17 0510  AST 1,527*  --  723*  --  384*  --  216*  --   --   --   --  142*  ALT 509*  --  359*  --  275*  --  172*  --   --   --   --  120*  ALKPHOS 461*  --  481*  --  485*  --  455*  --   --   --   --  432*  BILITOT 9.8*  --  12.0*  --  12.4*  --  16.8*  --   --  20.8*  --  19.3*  PROT 6.1*  --  5.6*  --  5.8*  --  6.4*  --   --   --   --  6.0*  ALBUMIN 2.5*   < > 2.0*   < > 1.9* 2.4* 2.1* 1.9*  --   --  1.7* 1.6*  INR 1.77  --  2.27  --  2.06  --  1.69  --  1.82  --   --  1.98   < > = values in this interval not displayed.   INFECTIOUS Recent Labs  Lab 08/30/2017 1906 09/11/17 0451  LATICACIDVEN 5.5* 3.0*   ENDOCRINE CBG (last 3)  Recent Labs    09/15/17 1929 09/16/17 0010 09/16/17 0348  GLUCAP 150* 130* 148*   IMAGING x48h  - image(s) personally visualized  -   highlighted in bold Dg Chest Port 1 View  Result Date: 09/15/2017 CLINICAL DATA:  Tracheostomy status EXAM: PORTABLE CHEST 1 VIEW COMPARISON:  09/15/2017 600 hours FINDINGS: The endotracheal tube has been exchanged for a tracheostomy tube. The tip is 5.1 cm from the carina. Low lung volumes. Normal heart size. Bibasilar atelectasis increased. Bilateral nephroureteral catheter is in place. Stable right jugular central venous  catheter. NG tube removed. IMPRESSION: Tracheostomy tube is in place. Bibasilar atelectasis increased Electronically Signed   By: Marybelle Killings M.D.   On: 09/15/2017 12:58   Dg Chest Port 1 View  Result Date: 09/15/2017 CLINICAL DATA:  Endotracheal tube present.  Respiratory failure. EXAM: PORTABLE CHEST 1 VIEW COMPARISON:  09/14/2017 FINDINGS: Endotracheal tube tip has been advanced, now 4.1 cm above the carina. Right jugular central venous catheter and nasogastric tube remain in appropriate position. Heart size is within normal limits. Aortic atherosclerosis. Stable subsegmental atelectasis in right lung base. No evidence of pulmonary consolidation or pleural effusion. IMPRESSION: Endotracheal tube in appropriate position. Stable mild right basilar atelectasis. Electronically Signed   By: Earle Gell M.D.   On: 09/15/2017 09:53   Dg Abd Portable 1v  Result Date: 09/15/2017 CLINICAL DATA:  Feeding tube placement. EXAM: PORTABLE ABDOMEN - 1 VIEW COMPARISON:  CT, 09/08/2017. FINDINGS: Nasogastric tube passes well below the diaphragm. Tip projects in the distal stomach or first portion of the duodenum. Percutaneous pigtail catheters are noted, 1 projecting in the epigastric region the other in the right upper quadrant along the inferior margin of the liver. Biliary stent is noted. IMPRESSION: Well-positioned nasogastric tube. Electronically Signed   By: Lajean Manes M.D.   On: 09/15/2017 18:11     DISCUSSION: [name] is a 84 year old female with ESRD on MWF HD, HTN, Anemia, and hx of breast cancer who was admitted 4/4 with acute cholecystitis. S/p lap chole 4/6 complicated by post-op bile leak. Went for ERCP 4/11 however developed bilious emesis during prep, hypotension, and required intubation plus  pressor support. Transferred to the ICU on 4/11.  ASSESSMENT / PLAN: PULMONARY A: Acute Respiratory Distress 2/2 Aspiration Requiring Mechanical Ventilation since 4/11  P:   - Trach 4/28,  remains on full vent support and has failed SBT. Continue SBTs  - Titrate O2 for sat of 88-92%  CARDIOVASCULAR A:  Aortic Atherosclerosis on CXR RBBB - present prior to admission #current Septic Shock 2/2 biliary peritonitis   Troponin elevation (peak 4.47) 2/2 demand ischemia in setting of hypotensive event - resolved  Echocardiogram w/ EF 65-70% Prolonged QTc LUE DVT - heparin started 4/19 and stopped 4/21 P:  - Levophed at 17 mcg at this point. Might restart vasopressin today  - MAP goal > 65 - Heparin stopped in the setting of acute blood loss 4/21   RENAL  A:   ESRD on MWF HD AVF LUE P:   - CRRT restarted on 4/21, running even - Nephrology following  - Follow up output and monitor electrolytes  - Replace electrolytes as indicated  GASTROINTESTINAL A:   Acute Cholecystitis s/p lap chole 4/6 Bile leak/biliary peritonitis s/p IR Rt abdominal fluid drain 4/11 - biloma by HIDA 4/14 Shock Liver Multiple new intraperitoneal biloma found on 4/21 Severe protein calorie malnutrition  Erosive gastritis seen on ERCP 4/23  Coffee-ground emesis 4/26 SUP Hyperbilirubinemia  P:   - Zosyn 4/21>  - S/p mid-abdomen drain placed by IR 4/24  - s/p ERCP 4/23 with stent placement that will need removal in 6-8 weeks   - Right abdominal fluid drain in place with minimal drainage, being flush q8h  - Appreciate surgery assistance.  - GI signed off 4/24. Re-consulting for hyperbilirubinemia in the setting of improved transaminitis  - TPN started 4/23. Will continue for now as patient unable to tolerate tube feeds  - Continue protonix as ordered  HEMATOLOGIC A:   Hx of Breast Cancer - no acute issue Acute on chronic anemia (AOCD) - requiring multiple transfusions  Leukocytosis  Thrombocytopenia - Plt 50 LUE (IJV) DVT Acute blood loss anemia requiring multiple transfusions  P:   - Hgb goal > 7 - CBC daily - Bivalirudin stopped 4/20, heparin initiated after HIT antibody was  negative  INFECTIOUS A:   Septic Shock from Aspiration PNA - resolved  Bile Leak s/p IR Biliary Drain 4/11 Septic shock 2/2 new intraperitoneal abscesses/biloma Leukocytosis WBC 49 4/23 - likely leukemoid reaction  P:   - Zosyn 4/12 >> 4/19. Restarted Zosyn 4/21. Consider stopping as has completed 1 week of therapy and drain output cx negative  - Drain output culture - negative at 3 days   ENDOCRINE A:   Secondary Hyperparathyroidism Hypoglycemia - likely 2/2 shock liver. Resolved  P:   - TPN started 4/23 - CBG monitoring q4h  - CBG goal < 180  NEUROLOGIC A:   Pain/Agitation P:   - Fentanyl gtt  - RASS goal: 0 to -1   - Fentanyl and Versed as needed  FAMILY  - Updates: Family updated 4/29    Welford Roche, MD  Internal Medicine PGY-1  P [phone]

## 2017-09-16 NOTE — Progress Notes (Addendum)
PHARMACY - ADULT TOTAL PARENTERAL NUTRITION CONSULT NOTE   Pharmacy Consult:  TPN Indication: Intolerance to enteral nutrition  Patient Measurements: Height: 5\' 2"  (157.5 cm) Weight: 138 lb 0.1 oz (62.6 kg) IBW/kg (Calculated) : 50.1 TPN AdjBW (KG): 61.7 Body mass index is 25.24 kg/m.  Assessment:  74 YOF presented on 08/20/2017 with abdominal pain and vomiting, found to have cholecystitis and she underwent cholecystomy on 08/28/2017.  She was on a clear liquid diet before and after surgery, but did not tolerate.  Imaging showed bile leak with fluid collection around the liver, and patient aspirated prior to planned ERCP on.  She was started on TF on 09/05/17 but did not tolerate and retried on 09/06/17 without success.  Pharmacy consulted to manage TPN for nutritional support.  GI: didn't tolerate TF again on 4/26.  Prealbumin improved to 5, NG O/P 447mL, biloma O/P 74mL, LBM 4/27.  PPI PT Endo: hypoglycemic prior to TPN, CBGs acceptable Insulin requirements in the past 24 hours: 7 units SSI Lytes: Na low normal, CoCa slightly high at 10.62, others WNL Renal: ESRD on MWF HD, CRRT restarted 4/21, BUN 28, net 4.6L - Aranesp, Hectorol Pulm: s/p trach 4/28 - FiO2 down to 30% Cards: HTN, s/p NSTEMI.  MAP 60s on Levophed, HR high normal AC: s/p heparin for LUE DVT - hgb low but stable, plts down to 33, INR 1.98 s/p Vit K Hepatobil: shock liver s/p ERCP and CBD stent 4/23 - LFTs generally downtrending, tbili elevated at 19.3, TG 105 >> 171 Neuro: Fentanyl gtt - GCS 11, CPOT 0-1, RASS -1 ID: Zosyn for PNA/bile leak - afebrile, WBC down to 16.5 TPN Access: R IJ placed 09/03/2017 TPN start date: 09/08/17  Nutritional Goals (per RD rec on 09/12/17): 1250-1500 kCal and 110-122 gm protein per day  Current Nutrition:  TPN   Plan:  Continue TPN at 78ml/hr, providing 114g AA, 180g CHO, 28g ILE for a total of 1,344 kCal, meeting 100% of patient needs Electrolytes in TPN: increase Na, reduce Ca/Phos, no  change K/Mag, Cl:Ac 1:2 Daily multivitamin in TPN Remove multi-trace d/t liver injury - add zinc 5mg , chromium 51mcg, selenium 78mcg Reduce sensitive SSI to Q6H Watch TG F/U ability to trial TFs again vs cycling TPN   Azarya Oconnell D. Mina Marble, PharmD, BCPS, BCCCP Pager:  [phone] 09/16/2017, 7:35 AM

## 2017-09-17 ENCOUNTER — Inpatient Hospital Stay (HOSPITAL_COMMUNITY): Payer: Medicare Other

## 2017-09-17 DIAGNOSIS — K7460 Unspecified cirrhosis of liver: Secondary | ICD-10-CM

## 2017-09-17 LAB — COMPREHENSIVE METABOLIC PANEL
ALBUMIN: 2.7 g/dL — AB (ref 3.5–5.0)
ALK PHOS: 302 U/L — AB (ref 38–126)
ALT: 102 U/L — ABNORMAL HIGH (ref 14–54)
ANION GAP: 11 (ref 5–15)
AST: 165 U/L — ABNORMAL HIGH (ref 15–41)
BILIRUBIN TOTAL: 25 mg/dL — AB (ref 0.3–1.2)
BUN: 24 mg/dL — ABNORMAL HIGH (ref 6–20)
CALCIUM: 9.4 mg/dL (ref 8.9–10.3)
CO2: 25 mmol/L (ref 22–32)
Chloride: 101 mmol/L (ref 101–111)
Creatinine, Ser: 0.56 mg/dL (ref 0.44–1.00)
GFR calc non Af Amer: >60 mL/min (ref >60)
Glucose, Bld: 168 mg/dL — ABNORMAL HIGH (ref 65–99)
POTASSIUM: 5.1 mmol/L (ref 3.5–5.1)
Sodium: 137 mmol/L (ref 135–145)
TOTAL PROTEIN: 5.7 g/dL — AB (ref 6.5–8.1)

## 2017-09-17 LAB — CBC
HEMATOCRIT: 21.9 % — AB (ref 36.0–46.0)
HEMATOCRIT: 25 % — AB (ref 36.0–46.0)
HEMOGLOBIN: 8.1 g/dL — AB (ref 12.0–15.0)
Hemoglobin: 7 g/dL — ABNORMAL LOW (ref 12.0–15.0)
MCH: 32 pg (ref 26.0–34.0)
MCH: 31.3 pg (ref 26.0–34.0)
MCHC: 32 g/dL (ref 30.0–36.0)
MCHC: 32.4 g/dL (ref 30.0–36.0)
MCV: 100 fL (ref 78.0–100.0)
MCV: 96.5 fL (ref 78.0–100.0)
Platelets: 31 10*3/uL — ABNORMAL LOW (ref 150–400)
Platelets: 82 10*3/uL — ABNORMAL LOW (ref 150–400)
RBC: 2.19 MIL/uL — ABNORMAL LOW (ref 3.87–5.11)
RBC: 2.59 MIL/uL — ABNORMAL LOW (ref 3.87–5.11)
RDW: 24.6 % — AB (ref 11.5–15.5)
RDW: 20.9 % — AB (ref 11.5–15.5)
WBC: 17.3 10*3/uL — AB (ref 4.0–10.5)
WBC: 18 10*3/uL — ABNORMAL HIGH (ref 4.0–10.5)

## 2017-09-17 LAB — PROTIME-INR
INR: 1.86
PROTHROMBIN TIME: 21.3 s — AB (ref 11.4–15.2)

## 2017-09-17 LAB — RENAL FUNCTION PANEL
Albumin: 3.1 g/dL — ABNORMAL LOW (ref 3.5–5.0)
Anion gap: 12 (ref 5–15)
BUN: 25 mg/dL — ABNORMAL HIGH (ref 6–20)
CHLORIDE: 101 mmol/L (ref 101–111)
CO2: 25 mmol/L (ref 22–32)
CREATININE: 0.51 mg/dL (ref 0.44–1.00)
Calcium: 9.6 mg/dL (ref 8.9–10.3)
Glucose, Bld: 209 mg/dL — ABNORMAL HIGH (ref 65–99)
POTASSIUM: 4.9 mmol/L (ref 3.5–5.1)
Phosphorus: 4.1 mg/dL (ref 2.5–4.6)
Sodium: 138 mmol/L (ref 135–145)

## 2017-09-17 LAB — PHOSPHORUS: PHOSPHORUS: 3.5 mg/dL (ref 2.5–4.6)

## 2017-09-17 LAB — GLUCOSE, CAPILLARY
GLUCOSE-CAPILLARY: 160 mg/dL — AB (ref 65–99)
GLUCOSE-CAPILLARY: 207 mg/dL — AB (ref 65–99)
Glucose-Capillary: 166 mg/dL — ABNORMAL HIGH (ref 65–99)
Glucose-Capillary: 147 mg/dL — ABNORMAL HIGH (ref 65–99)
Glucose-Capillary: 150 mg/dL — ABNORMAL HIGH (ref 65–99)
Glucose-Capillary: 189 mg/dL — ABNORMAL HIGH (ref 65–99)

## 2017-09-17 LAB — PREPARE RBC (CROSSMATCH)

## 2017-09-17 LAB — TROPONIN I: Troponin I: 0.16 ng/mL (ref <0.03)

## 2017-09-17 LAB — PROCALCITONIN: PROCALCITONIN: 4.28 ng/mL

## 2017-09-17 LAB — MAGNESIUM: Magnesium: 2.3 mg/dL (ref 1.7–2.4)

## 2017-09-17 MED ORDER — SODIUM CHLORIDE 0.9 % IV SOLN
100.0000 mg | INTRAVENOUS | Status: DC
  Administered 2017-09-18 – 2017-09-19 (×2): 100 mg via INTRAVENOUS
  Filled 2017-09-17 (×2): qty 100

## 2017-09-17 MED ORDER — ORAL CARE MOUTH RINSE
15.0000 mL | Freq: Four times a day (QID) | OROMUCOSAL | Status: DC
  Administered 2017-09-18 – 2017-09-23 (×23): 15 mL via OROMUCOSAL

## 2017-09-17 MED ORDER — SODIUM CHLORIDE 0.9 % IV SOLN
Freq: Once | INTRAVENOUS | Status: AC
  Administered 2017-09-17: 08:00:00 via INTRAVENOUS

## 2017-09-17 MED ORDER — ALBUMIN HUMAN 25 % IV SOLN
50.0000 g | Freq: Once | INTRAVENOUS | Status: AC
  Administered 2017-09-17: 50 g via INTRAVENOUS
  Filled 2017-09-17: qty 50

## 2017-09-17 MED ORDER — PANTOPRAZOLE SODIUM 40 MG IV SOLR
40.0000 mg | Freq: Two times a day (BID) | INTRAVENOUS | Status: DC
  Administered 2017-09-17 – 2017-09-23 (×13): 40 mg via INTRAVENOUS
  Filled 2017-09-17 (×14): qty 40

## 2017-09-17 MED ORDER — HYDROCORTISONE NA SUCCINATE PF 100 MG IJ SOLR
50.0000 mg | Freq: Four times a day (QID) | INTRAMUSCULAR | Status: DC
  Administered 2017-09-17 – 2017-09-23 (×25): 50 mg via INTRAVENOUS
  Filled 2017-09-17: qty 1
  Filled 2017-09-17: qty 2
  Filled 2017-09-17 (×7): qty 1
  Filled 2017-09-17: qty 2
  Filled 2017-09-17 (×3): qty 1
  Filled 2017-09-17: qty 2
  Filled 2017-09-17 (×2): qty 1
  Filled 2017-09-17: qty 2
  Filled 2017-09-17: qty 1
  Filled 2017-09-17: qty 2
  Filled 2017-09-17 (×11): qty 1

## 2017-09-17 MED ORDER — SODIUM CHLORIDE 0.9 % IV SOLN
1.0000 g | Freq: Two times a day (BID) | INTRAVENOUS | Status: DC
  Administered 2017-09-17 – 2017-09-19 (×5): 1 g via INTRAVENOUS
  Filled 2017-09-17 (×5): qty 1

## 2017-09-17 MED ORDER — ZINC TRACE METAL 1 MG/ML IV SOLN
INTRAVENOUS | Status: AC
  Administered 2017-09-17: 18:00:00 via INTRAVENOUS
  Filled 2017-09-17: qty 760

## 2017-09-17 MED ORDER — CHLORHEXIDINE GLUCONATE 0.12% ORAL RINSE (MEDLINE KIT)
15.0000 mL | Freq: Two times a day (BID) | OROMUCOSAL | Status: DC
  Administered 2017-09-17 – 2017-09-23 (×12): 15 mL via OROMUCOSAL

## 2017-09-17 MED ORDER — SODIUM CHLORIDE 0.9 % IV SOLN
200.0000 mg | Freq: Once | INTRAVENOUS | Status: AC
  Administered 2017-09-17: 200 mg via INTRAVENOUS
  Filled 2017-09-17: qty 200

## 2017-09-17 NOTE — Progress Notes (Addendum)
Daily Rounding Note  09/17/2017, 1:57 PM  LOS: 26 days   ASSESMENT:   *  Postop bile leak.  Treated so far with 2 separate interim abdominal drains and stenting 4/23.. Total bilirubin continues to climb.  Today AST further elevated while the ALT and alk phos have decreased.    *   Transfusion requiring anemia. Received # 8 PRBC today.  Previously bloody stools, none in the past several days.  Bloody output at various times into the biliary drain. Today blood per NGT noted.   At ERCP on 4/23 Dr. Fuller Plan described erosive gastritis gastric mucosal lesions consistent with gastric tube suction trauma. Receiving Protonix 40 IV BID.    *   Thrombocytopenia.  Improved.  *    Vent dependent respiratory failure.  Status post tracheostomy.  *    ESRD.  On CRRT.    PLAN   *   ? EGD?   *  Should we stop suction on the NGT given finding of trauma on 4/23?   Pam Fuller  09/17/2017, 1:57 PM Phone [place] Attending   I have taken an interval history, reviewed the chart and examined the patient. I agree with the Advanced Practitioner's note, impression and recommendations.   Remains critically and severely ill and is worse overall.  She is now only on Levophed but has persistent but not large bloody NG output. Hgb drop (acute blood loss anemia)  was significant but does not seem to correlate with the bleeding we see so I think CT scan is a good idea.  Depending upon what that shows, clinical course and family wishes an EGD could be done (tomorrow?) but I do not think GI bleeding is a primary driving factor and may more likely be the result of hypotension and possibly its Tx and also a result of thrombocytopenia. May have diffuse gut ischemia?  Would leave NG tube in at this point - and on suction for now   Gatha Mayer, MD, [facility] 09/17/2017 5:06 PM    SUBJECTIVE:   Bloody NG  tube output and blood in bile drains persists.  Remains hypotensive despite Levophed, Vasopressin.  Sedated on Fentanyl drip.   210 ml of bile drainage from both tubes yesterday.   545 ml NGT output yest, 150 ml so far today.      OBJECTIVE:         Vital signs in last 24 hours:    Temp:  [95.4 F (35.2 C)-98.2 F (36.8 C)] 96.2 F (35.7 C) (04/30 1137) Pulse Rate:  [73-103] 88 (04/30 1023) Resp:  [14-30] 16 (04/30 1100) BP: (98-141)/(38-82) 141/54 (04/30 1100) SpO2:  [92 %-100 %] 100 % (04/30 1200) FiO2 (%):  [30 %] 30 % (04/30 1200) Weight:  [134 lb 4.2 oz (60.9 kg)] 134 lb 4.2 oz (60.9 kg) (04/30 0500) Last BM Date: 09/16/17 Filed Weights   09/15/17 0400 09/16/17 0500 09/17/17 0500  Weight: 140 lb 10.5 oz (63.8 kg) 138 lb 0.1 oz (62.6 kg) 134 lb 4.2 oz (60.9 kg)   General: sedated on vent.  Looks critically ill   Heart: RRR Chest: clear in front.  Breathing unlabored on vent Abdomen: soft, NT.  BS hypoactive.  Medium to dark red blood per NGT.  Scant blood in bole bile drain bags.    Extremities: thin, edema in upper arms.  Feet luke warm.   Neuro/Psych:  Unresponsive, sedated on vent  Intake/Output from previous day: 04/29 0701 - 04/30 0700 In: 2896.4 [I.V.:2496.4; IV Piggyback:75] Out: 2935 [Emesis/NG output:545; Drains:210]  Intake/Output this shift: Total I/O In: 1439.9 [I.V.:569.1; Blood:702.3; IV Piggyback:168.5] Out: 1451 [Emesis/NG output:150; Drains:45; Other:1256]  Lab Results: Recent Labs    09/16/17 0510 09/17/17 0523 09/17/17 1145  WBC 16.5* 17.3* PENDING  HGB 10.1* 7.0* 8.1*  HCT 31.3* 21.9* 25.0*  PLT 33* 31* 82*   BMET Recent Labs    09/16/17 0510 09/16/17 1835 09/17/17 0523  NA 135 135 137  K 4.4 4.7 5.1  CL 102 100* 101  CO2 24 25 25   GLUCOSE 161* 173* 168*  BUN 28* 28* 24*  CREATININE 0.72 0.68 0.56  CALCIUM 8.7* 9.2 9.4   LFT Recent Labs    09/15/17 1534  09/16/17 0510 09/16/17 1835 09/17/17 0523  PROT  --   --  6.0*  --   5.7*  ALBUMIN  --    < > 1.6* 3.4* 2.7*  AST  --   --  142*  --  165*  ALT  --   --  120*  --  102*  ALKPHOS  --   --  432*  --  302*  BILITOT 20.8*  --  19.3*  --  25.0*  BILIDIR 14.5*  --   --   --   --   IBILI 6.3*  --   --   --   --    < > = values in this interval not displayed.   PT/INR Recent Labs    09/16/17 0510 09/17/17 0523  LABPROT 22.4* 21.3*  INR 1.98 1.86   Studies/Results:  Scheduled Meds: . chlorhexidine gluconate (MEDLINE KIT)  15 mL Mouth Rinse BID  . Chlorhexidine Gluconate Cloth  6 each Topical Daily  . darbepoetin (ARANESP) injection - DIALYSIS  150 mcg Subcutaneous Q Mon-1800  . doxercalciferol  2 mcg Intravenous Q M,W,F-1800  . hydrocortisone sod succinate (SOLU-CORTEF) inj  50 mg Intravenous Q6H  . insulin aspart  0-9 Units Subcutaneous Q6H  . mouth rinse  15 mL Mouth Rinse Q4H  . pantoprazole (PROTONIX) IV  40 mg Intravenous Q12H  . sodium chloride flush  10-40 mL Intracatheter Q12H  . sodium chloride flush  5 mL Intracatheter Q8H   Continuous Infusions: . sodium chloride Stopped (09/17/17 0800)  . [START ON 09/18/2017] anidulafungin    . feeding supplement (VITAL HIGH PROTEIN) Stopped (09/13/17 1025)  . fentaNYL infusion INTRAVENOUS 150 mcg/hr (09/17/17 0816)  . meropenem (MERREM) IV    . norepinephrine (LEVOPHED) Adult infusion 7 mcg/min (09/17/17 1207)  . dialysis replacement fluid (prismasate) 400 mL/hr at 09/17/17 0129  . dialysis replacement fluid (prismasate) 200 mL/hr at 09/16/17 1155  . dialysate (PRISMASATE) 1,600 mL/hr at 09/17/17 1055  . sodium chloride 999 mL/hr at 09/15/17 0119  . TPN ADULT (ION) 50 mL/hr at 09/16/17 1900  . TPN ADULT (ION)    . vasopressin (PITRESSIN) infusion - *FOR SHOCK* 0.01 Units/min (09/17/17 1207)   PRN Meds:.albuterol, alteplase, docusate, fentaNYL (SUBLIMAZE) injection, heparin, midazolam, sodium chloride

## 2017-09-17 NOTE — Progress Notes (Signed)
PULMONARY / CRITICAL CARE MEDICINE   Name: Pam Fuller MRN: [id number] DOB: 1933-02-20    ADMISSION DATE:  09/02/2017 CONSULTATION DATE:  4/11  REFERRING MD:  Dr. Maudie Mercury  CHIEF COMPLAINT:  Aspiration  BRIEF SUMMARY:   Pam Fuller is a 84 year old female with ESRD on MWF HD, HTN, Anemia, and hx of breast cancer who presented to the ED on 4/4 with RLQ abdominal pain. She was found to have acute cholecystitis with a gallstone lodged in the gallbladder neck. CBD measured up to 10 mm. She underwent laparoscopic cholecystectomy on 4/6. She initially did well but on 4/8 was noted to have worsened pain and confusion and thought to have post-op ileus. CT abd/pelvis 4/8 showed small-mod fluid in the abdominal pelvis, read as normal post op changes but could not exclude a bile leak. A hepatobiliary scan was obtained on 4/9 which did suggest a bile leak. Surgery recommended IR drain and GI consult for ERCP. She went to the Endoscopy unit on 4/11 AM for ERCP however during prep with anesthesia she had significant bilious emesis with hypotension requiring intubation and pressor support. ERCP was not attempted. She was subsequently transferred to the medical ICU.   STUDIES:  CT abd/pelv 4/4 >> acute cholecystitis, gallstone in GB neck, CBD 10 mm  CT Head: no acute changes, chronic small vessel ischemic changes  CT abd/pelvis 4/8 >> small-mod fluid in abd/pelvis  Hepatobiliary scan 4/9 >> bile leak  TTE 4/12 >> EF 65-70%, G1DD, narrow LVOT, mild TR, mild pHTN  U/S Abdomen 4/14 >> small fluid near cholecystectomy site  Hepatobiliary scan 4/14 >> Rt subhepatic fluid collection consistent w/ biloma; increased in size from prior  CT abd/pelvis w/ oral contrast 4/16 >> drainage catheter at inferior liver margin with decreased perihepatic fluid; low-density fluid at left subdiaphragmatic area, questionable colonic wall thickening throughout, multifocal airspace disease of lung bases  CT  abdomen.pelvis 4/21: interval growth of large perihepatic and perisplenic fluid collection with multiple, new intraperitoneal fluid collections. Resolving multifocal pneumonia.   CULTURES: Tracheal aspirate 4/11 >> no growth Abd fluid 4/11 >> no growth 5 days Abd fluid 4/15 >> no growth 3 days  ANTIBIOTICS: Zosyn 4/3 >> 4/7 Unasyn 4/11 Zosyn 4/12 - 4/19 Zosyn 4/21-4/29  LINES/TUBES: ETT 4/11>4/28 OGT 4/11>4/28 PIV RUE 4/9 RLQ Biliary Drain 4/11 HD Cath LIJ 4/12 >> 4/19 L femoral trialysis catheter 4/21> RIJ cath 4/23>  Tracheostomy 4/28>  SIGNIFICANT EVENTS: 4/4 - admitted w/ acute cholecystitis 4/6 - lap chole 4/9 - bile leak seen on hepatobiliary scan 4/11 - Aspirated during prep for ERCP, intubated and transferred to ICU 4/11 - Rt abd fluid drain placed by IR 4/12- Heparin gtt started for NSTEMI; held after OG bleeding and trop peak 4/13 - worsening liver enzyymes. ESRD - Anuric. CRRT started. On levophed 16mcg and fent 132mcg 4/14 - Hgb 6.0, transfused 2 units PRBCs 4/17 - DVT LUE - started bivalirudin for suspected HIT 4/18 - CVVH d/c'ed, return to intermittent HD.  4/19 - Restarted HD. HIT negative  4/20 - Bleeding from GB drain, OGT, and stools. Received 1u pRBC. Heparin gtt held. 4/21 - Multiple, new fluid collections found on CT abd/pelvis   4/23 - ERCP with cystic duct bile leak s/p stent placement, erosive gastritis, and CBD dilation  4/24 - Mid-abdomen perc drain placed by IR  4/26 - Coffee-ground emesis after restarting tube feeds  4/28 - Tracheostomy  4/29 - Significant bloody output from NGT with drop in Hgb  10->7  SUBJECTIVE/OVERNIGHT/INTERVAL HX -  No acute events overnight. Now normotensive on 18 of levophed and vasopressin. Does not arouse to voice or follows commands. Continues to have bloody output from NGT.     VITAL SIGNS: BP (!) 124/49 (BP Location: Right Arm)   Pulse 96   Temp 97.7 F (36.5 C) (Oral)   Resp 19   Ht 5\' 2"  (1.575 m)   Wt 134  lb 4.2 oz (60.9 kg)   SpO2 100%   BMI 24.56 kg/m   HEMODYNAMICS:    VENTILATOR SETTINGS: Vent Mode: PRVC FiO2 (%):  [30 %] 30 % Set Rate:  [14 bmp] 14 bmp Vt Set:  [400 mL] 400 mL PEEP:  [5 cmH20] 5 cmH20 Pressure Support:  [10 cmH20] 10 cmH20 Plateau Pressure:  [19 cmH20-27 cmH20] 20 cmH20  INTAKE / OUTPUT: I/O last 3 completed shifts: In: 5792.4 [I.V.:4647.4; Other:340; NG/GT:30; IV Piggyback:775] Out: 3557 [Emesis/NG output:650; Drains:75; Other:4225]  PHYSICAL EXAMINATION:  General: Chronically ill appearing female, sedated, NAD  HEENT: [place]/AT, scleral icterus present, PERRL, trach and NG tube in place Cardiac:  RRR, Nl S1/S2, no mrg  Pulm: CTAB, no increased WOB  Abd: Soft, NT, ND and decreased bowel sounds  Ext: LUE edema L>>R unchanged from yesterday, L AVF with palpable thrill, femoral trialysis cathter in place with dressing c/d/i  Neuro: sedated, does not follow commands, moves all 4 extremities spontaneously but no purposefully    PULMONARY Recent Labs  Lab 08/30/2017 1840  PHART 7.335*  PCO2ART 42.6  PO2ART 95.0  HCO3 22.1  O2SAT 97.5   CBC Recent Labs  Lab 09/15/17 0321 09/16/17 0510 09/17/17 0523  HGB 10.0* 10.1* 7.0*  HCT 31.2* 31.3* 21.9*  WBC 12.1* 16.5* PENDING  PLT 50* 33* PENDING   COAGULATION Recent Labs  Lab 09/13/17 0325 09/14/17 0428 09/15/17 0321 09/16/17 0510 09/17/17 0523  INR 2.06 1.69 1.82 1.98 1.86   CARDIAC   No results for input(s): TROPONINI in the last 168 hours. No results for input(s): PROBNP in the last 168 hours.  CHEMISTRY Recent Labs  Lab 09/13/17 0325  09/14/17 0428  09/15/17 0321 09/15/17 1535 09/16/17 0510 09/16/17 1835 09/17/17 0523  NA 137   < > 138   < > 136 135 135 135 137  K 4.4   < > 4.1   < > 3.9 4.3 4.4 4.7 5.1  CL 106   < > 101   < > 102 101 102 100* 101  CO2 24   < > 27   < > 26 23 24 25 25   GLUCOSE 182*   < > 172*   < > 136* 190* 161* 173* 168*  BUN 12   < > 24*   < > 28* 28* 28* 28*  24*  CREATININE 0.73   < > 0.87   < > 0.79 0.69 0.72 0.68 0.56  CALCIUM 8.6*   < > 9.1   < > 8.8* 8.2* 8.7* 9.2 9.4  MG 2.2  --  2.6*  --  2.4  --  2.3  --  2.3  PHOS 1.5*   < > 2.8   < > 2.7 3.1 3.6 3.5 3.5   < > = values in this interval not displayed.   Estimated Creatinine Clearance: 46.6 mL/min (by C-G formula based on SCr of 0.56 mg/dL).  I/O last 3 completed shifts: In: 5792.4 [I.V.:4647.4; Other:340; NG/GT:30; IV Piggyback:775] Out: 3220 [Emesis/NG output:650; Drains:75; Other:4225]  Intake/Output Summary (Last 24 hours) at  09/17/2017 0653 Last data filed at 09/17/2017 0600 Gross per 24 hour  Intake 2876.14 ml  Output 2942 ml  Net -65.86 ml   LIVER Recent Labs  Lab 09/12/17 0421  09/13/17 0325  09/14/17 0428 09/14/17 1527 09/15/17 0321 09/15/17 1534 09/15/17 1535 09/16/17 0510 09/16/17 1835 09/17/17 0523  AST 723*  --  384*  --  216*  --   --   --   --  142*  --  165*  ALT 359*  --  275*  --  172*  --   --   --   --  120*  --  102*  ALKPHOS 481*  --  485*  --  455*  --   --   --   --  432*  --  302*  BILITOT 12.0*  --  12.4*  --  16.8*  --   --  20.8*  --  19.3*  --  25.0*  PROT 5.6*  --  5.8*  --  6.4*  --   --   --   --  6.0*  --  5.7*  ALBUMIN 2.0*   < > 1.9*   < > 2.1* 1.9*  --   --  1.7* 1.6* 3.4* 2.7*  INR 2.27  --  2.06  --  1.69  --  1.82  --   --  1.98  --  1.86   < > = values in this interval not displayed.   INFECTIOUS Recent Labs  Lab 09/12/2017 1906 09/11/17 0451  LATICACIDVEN 5.5* 3.0*   ENDOCRINE CBG (last 3)  Recent Labs    09/16/17 2103 09/16/17 2345 09/17/17 0548  GLUCAP 161* 151* 166*   IMAGING x48h  - image(s) personally visualized  -   highlighted in bold Dg Chest Port 1 View  Result Date: 09/16/2017 CLINICAL DATA:  Tracheostomy. EXAM: PORTABLE CHEST 1 VIEW COMPARISON:  Radiograph of September 15, 2017. FINDINGS: The heart size and mediastinal contours are within normal limits. Atherosclerosis of thoracic aorta is noted. Tracheostomy  tube is in grossly good position. Right internal jugular catheter is again noted with tip in expected position of the SVC. Nasogastric tube is seen entering the stomach. No pneumothorax is noted. Minimal bibasilar subsegmental atelectasis is noted. The visualized skeletal structures are unremarkable. IMPRESSION: Tracheostomy tube in good position. Stable minimal bibasilar subsegmental atelectasis. Aortic Atherosclerosis (ICD10-I70.0). Electronically Signed   By: Marijo Conception, M.D.   On: 09/16/2017 07:32   Dg Chest Port 1 View  Result Date: 09/15/2017 CLINICAL DATA:  Tracheostomy status EXAM: PORTABLE CHEST 1 VIEW COMPARISON:  09/15/2017 600 hours FINDINGS: The endotracheal tube has been exchanged for a tracheostomy tube. The tip is 5.1 cm from the carina. Low lung volumes. Normal heart size. Bibasilar atelectasis increased. Bilateral nephroureteral catheter is in place. Stable right jugular central venous catheter. NG tube removed. IMPRESSION: Tracheostomy tube is in place. Bibasilar atelectasis increased Electronically Signed   By: Marybelle Killings M.D.   On: 09/15/2017 12:58   Dg Abd Portable 1v  Result Date: 09/15/2017 CLINICAL DATA:  Feeding tube placement. EXAM: PORTABLE ABDOMEN - 1 VIEW COMPARISON:  CT, 09/08/2017. FINDINGS: Nasogastric tube passes well below the diaphragm. Tip projects in the distal stomach or first portion of the duodenum. Percutaneous pigtail catheters are noted, 1 projecting in the epigastric region the other in the right upper quadrant along the inferior margin of the liver. Biliary stent is noted. IMPRESSION: Well-positioned nasogastric tube. Electronically Signed   By: Shanon Brow  Ormond M.D.   On: 09/15/2017 18:11     DISCUSSION: Pam Fuller is a 84 year old female with ESRD on MWF HD, HTN, Anemia, and hx of breast cancer who was admitted 4/4 with acute cholecystitis. S/p lap chole 4/6 complicated by post-op bile leak. Went for ERCP 4/11 however developed bilious emesis  during prep, hypotension, and required intubation plus pressor support. Transferred to the ICU on 4/11.  ASSESSMENT / PLAN: PULMONARY A: Acute Respiratory Distress 2/2 Aspiration Requiring Mechanical Ventilation since 4/11  P:   - Trach 4/28, remains on full vent support and has failed SBT. Continue SBTs  - Titrate O2 for sat of 88-92%  CARDIOVASCULAR A:  Aortic Atherosclerosis on CXR RBBB - present prior to admission #current Septic Shock 2/2 biliary peritonitis   Troponin elevation (peak 4.47) 2/2 demand ischemia in setting of hypotensive event - resolved  Echocardiogram w/ EF 65-70% Prolonged QTc LUE DVT - heparin started 4/19 and stopped 4/21 P:  - Levophed at 18 mcg at this point. Restarted vasopressin 4/29 with improvement in BP - S/p albumin 4/29 - MAP goal > 65 - Heparin stopped in the setting of acute blood loss 4/21   RENAL  A:   ESRD on MWF HD AVF LUE P:   - CRRT restarted on 4/21, running even - Nephrology following  - Follow up output and monitor electrolytes  - Replace electrolytes as indicated  GASTROINTESTINAL A:   Acute Cholecystitis s/p lap chole 4/6 Bile leak/biliary peritonitis s/p IR Rt abdominal fluid drain 4/11 - biloma by HIDA 4/14 Shock Liver Multiple new intraperitoneal biloma found on 4/21 Severe protein calorie malnutrition  Erosive gastritis seen on ERCP 4/23  Coffee-ground emesis 4/26 SUP Hyperbilirubinemia - in the setting of sepsis and TPN  P:   - Zosyn 4/21> 4/29  - s/p ERCP 4/23 with stent placement that will need removal in 6-8 weeks   - Abdominal drains with minimal output  - Appreciate surgery and GI assistance  - TPN started 4/23. Will continue for now as patient now having blood output from NG tube requiring 1u pRBCs - Continue protonix as ordered  HEMATOLOGIC A:   Hx of Breast Cancer - no acute issue Acute on chronic anemia (AOCD) - requiring multiple transfusions  Leukocytosis  Thrombocytopenia - Plt 31 LUE (IJV)  DVT Acute blood loss anemia requiring multiple transfusions  P:   - Ordered 1u pRBCs and 1u PLT this AM for Hgb in the setting of bloody output from NGT  - Hgb goal > 7 - CBC daily - Bivalirudin stopped 4/20, heparin initiated after HIT antibody was negative  INFECTIOUS A:   Septic Shock from Aspiration PNA - resolved  Bile Leak s/p IR Biliary Drain 4/11 Septic shock 2/2 new intraperitoneal abscesses/biloma Leukocytosis WBC 49 4/23 - likely leukemoid reaction  P:   - Zosyn 4/12 >> 4/19 and 4/21>4/29   - Drain output culture - negative at 3 days   ENDOCRINE A:   Secondary Hyperparathyroidism Hypoglycemia - likely 2/2 shock liver. Resolved  P:   - TPN started 4/23 - CBG monitoring q4h  - CBG goal < 180  NEUROLOGIC A:   Pain/Agitation P:   - Fentanyl gtt  - RASS goal: 0 to -1   - Fentanyl and Versed as needed  FAMILY  - Updates: Family updated 4/29    Welford Roche, MD  Internal Medicine PGY-1  P [phone]

## 2017-09-17 NOTE — Progress Notes (Signed)
Nutrition Follow-up  DOCUMENTATION CODES:   Non-severe (moderate) malnutrition in context of acute illness/injury  INTERVENTION:  Continue TPN When GI allows recommend post-pyloric TF of 10 ml/hr increasing by 10 ml Q4 hours until goal rate of 55 ml/hr (1327m/24hrs) met providing 1320 kcal, 115 grams protein, and 1108 ml H2O.  Feeding post-pyloric to help TF tolerance as pt with hx of N/V.   NUTRITION DIAGNOSIS:   Moderate Malnutrition related to acute illness(cholecystitis, bile leak) as evidenced by energy intake < 75% for > 7 days, mild muscle depletion.  Ongoing  GOAL:   Patient will meet greater than or equal to 90% of their needs  Met with TPN  MONITOR:   Other (Comment), TF tolerance, Skin, I & O's, Vent status, Labs(TPN tolerance)  REASON FOR ASSESSMENT:   Consult Enteral/tube feeding initiation and management  ASSESSMENT:   84year old ESRD on HD,underwent laparoscopic cholecystectomy on 4/6 after being admitted on 4/3 for acute cholecystitis, developed a biliary leak subsequently demonstrated an biliary scan 4/9. Plan was for ERCP 4/11 but during induction , aspirated, was intubated and transferred to ICU. Drain placed by IR .  Course further complicated by left upper extremity DVT and non-STEMI requiring heparin.  She developed shock on 4/20 with bleeding from abdominal drain.  MVe: 11.3  MAP: 64  Temp (24hrs), Avg:97.3 F (36.3 C), Min:95.4 F (35.2 C), Max:98.2 F (36.8 C)  Pt put on trach 09/15/17.   Pt sleeping in room. No family at bedside. Pt still on CRRT. Per rounds pt has bloody output via OG tube, still holding TF, continuing with TPN.   TPN @ 50 ml/hr providing 114 grams AA, 180 grams CHO, and 28 grams ILE. TPN provides 1344 kcal meeting 100% of pt needs. Multivitamin in TPN.   Medications reviewed: hectorol, solu-cortef, novolog 0-9 units, protonix, fentanyl, merrem, levophed, CRRT, vasopressin.  Labs reviewed: BG 168 (H), BUN 24 (H),  alkaline phosphatase 302 (H), AST 165 (H), ALT 102 (H), total protein 5.7 (L), total bilirubin 25 (H), troponin 0.16 (H), WBC 2.59 (L), RBC 8.1 (L), hemoglobin 25 (L), PTT 21.3 (H).    Diet Order:   Diet Order           Diet NPO time specified  Diet effective now          EDUCATION NEEDS:   No education needs have been identified at this time  Skin:  Skin Assessment: Skin Integrity Issues: Skin Integrity Issues:: Incisions Incisions: abdomen, breast  Last BM:  09/11/17  Height:   Ht Readings from Last 1 Encounters:  09/02/17 _0  (1.575 m)    Weight:   Wt Readings from Last 1 Encounters:  09/17/17 134 lb 4.2 oz (60.9 kg)    Ideal Body Weight:  50 kg  BMI:  Body mass index is 24.56 kg/m.  Estimated Nutritional Needs:   Kcal:  1272 kcal  Protein:  110-125 grams  Fluid:  > 1.3L/day or per MD   MHope Budds Dietetic Intern

## 2017-09-17 NOTE — Progress Notes (Addendum)
PHARMACY - ADULT TOTAL PARENTERAL NUTRITION CONSULT NOTE   Pharmacy Consult:  TPN Indication: Intolerance to enteral nutrition  Patient Measurements: Height: 5\' 2"  (157.5 cm) Weight: 134 lb 4.2 oz (60.9 kg) IBW/kg (Calculated) : 50.1 TPN AdjBW (KG): 61.7 Body mass index is 24.56 kg/m.  Assessment:  17 YOF presented on 08/27/2017 with abdominal pain and vomiting, found to have cholecystitis and she underwent cholecystomy on 09/02/2017.  She was on a clear liquid diet before and after surgery, but did not tolerate.  Imaging showed bile leak with fluid collection around the liver, and patient aspirated prior to planned ERCP on.  She was started on TF on 09/05/17 but did not tolerate and retried on 09/06/17 without success.  Pharmacy consulted to manage TPN for nutritional support.  GI: didn't tolerate TF again on 4/26.  Prealbumin improved to 5, NG O/P 520mL, biloma O/P 222mL, LBM 4/29.  PPI PT Endo: hypoglycemic prior to TPN, CBGs controlled Insulin requirements in the past 24 hours: 6 units SSI Lytes: K high normal, CoCa down to high normal, others WNL Renal: ESRD on MWF HD, CRRT restarted 4/21, BUN 28, net 4.4L - Aranesp, Hectorol Pulm: s/p trach 4/28 - FiO2 30% Cards: HTN, s/p NSTEMI.  MAP 60s on Levophed, HR high normal AC: s/p heparin for LUE DVT - hgb 10.1 > 7, plts down to 31, INR 1.86 s/p Vit K Hepatobil: shock liver s/p ERCP and CBD stent 4/23 - LFTs generally downtrending, TG 105 >> 171, tbili elevated at 25 (typically associated with lipid > 1 g/kg/d, has been providing 0.45 g/kg/day) Neuro: Fentanyl gtt - GCS 8, CPOT 0, RASS -2 ID: s/p Zosyn for PNA/bile leak - afebrile, WBC up 17.3 TPN Access: R IJ placed 08/23/2017 TPN start date: 09/08/17  Nutritional Goals (per RD rec on 09/12/17): 1250-1500 kCal and 110-122 gm protein per day  Current Nutrition:  TPN   Plan:  Continue TPN at 50 ml/hr, providing 114g AA, 180g CHO, 28g ILE for a total of 1,344 kCal, meeting 100% of patient  needs Electrolytes in TPN: reduce K, Na increased 4/29, Ca/Phos reduced 4/29, no change to mag, Cl:Ac 1:2 Daily multivitamin in TPN Remove multi-trace d/t liver injury - add zinc 5mg , chromium 12mcg, selenium 54mcg Continue sensitive SSI Q6H Watch TG F/U ability to trial TFs again   Kellis Mcadam D. Mina Marble, PharmD, BCPS, BCCCP Pager:  [phone] 09/17/2017, 7:27 AM

## 2017-09-17 NOTE — Progress Notes (Signed)
Pharmacy Antibiotic Note  Pam Fuller is a 84 y.o. female admitted on 08/26/2017 with aspiration prior to ERCP now with ongoing shock requiring vasopressors and CRRT. Pharmacy has been consulted for meropenem dosing. Eraxis added this morning with ongoing TPN and concurrent intraabdominal issues.   Plan: -Meropenem 1g IV q12h -Eraxis 200mg  IV x1 then 100mg  IV daily -Monitor renal plans, repeat cultures, LOT  Height: 5\' 2"  (157.5 cm) Weight: 134 lb 4.2 oz (60.9 kg) IBW/kg (Calculated) : 50.1  Temp (24hrs), Avg:97.3 F (36.3 C), Min:95.4 F (35.2 C), Max:98.2 F (36.8 C)  Recent Labs  Lab 09/16/2017 1906  09/11/17 0451  09/13/17 1654 09/14/17 0428  09/15/17 0321 09/15/17 1535 09/16/17 0510 09/16/17 1835 09/17/17 0523  WBC  --    < >  --    < > 15.1* 12.4*  --  12.1*  --  16.5*  --  17.3*  CREATININE  --    < >  --    < >  --  0.87   < > 0.79 0.69 0.72 0.68 0.56  LATICACIDVEN 5.5*  --  3.0*  --   --   --   --   --   --   --   --   --    < > = values in this interval not displayed.    Estimated Creatinine Clearance: 46.6 mL/min (by C-G formula based on SCr of 0.56 mg/dL).    Allergies  Allergen Reactions  . Erythromycin Nausea And Vomiting  . Vicodin [Hydrocodone-Acetaminophen]     unknown    Antimicrobials this admission: 4/11 Unasyn x1 4/12 Zosyn >>4/20; 4/21>4/29 4/20 Eraxis >> 4/20 Merrem >>  Microbiology results: 4/30 BCx: sent 4/25 abdomen ngtd  4/15 Bile Cx: negative 4/11 Bile Cx: negative 4/11 MRSA: negative 4/11 Trach aspirate: no growth  Thank you for allowing pharmacy to be a part of this patient's care.  Arrie Senate, PharmD, BCPS PGY-2 Cardiology Pharmacy Resident Pager: [phone] 09/17/2017

## 2017-09-17 NOTE — Care Management Note (Signed)
Case Management Note  Patient Details  Name: Pam Fuller MRN: [id number] Date of Birth: 04-24-1933  Subjective/Objective:  Pt s/p lap chole - during procedure pt aspirated and suffered a MI                  Action/Plan:  PTA from home - pt is a widow, pts sons are support system.  Pt is ESRD on outpt HD.   Pt remains intubated and on CRRT.  CM will continue to follow for discharge needs   Expected Discharge Date:                  Expected Discharge Plan:     In-House Referral:  Clinical Social Work  Discharge planning Services  CM Consult  Post Acute Care Choice:    Choice offered to:     DME Arranged:    DME Agency:     HH Arranged:    [facility] Agency:     Status of Service:     If discussed at [employer], dates discussed:    Additional Comments: 09/17/2017  Pt remains appropriate for continued stay.  Pt remains ventilated via trach, multiple drainage tubes, pressors and on CRRT.    09/05/17 Pt transferred to ICU ventilated on pressors requiring CRRT. CRRT d/c today and pt will resume IHD when appropriate  Maryclare Labrador, RN 09/17/2017, 2:48 PM

## 2017-09-17 NOTE — Progress Notes (Addendum)
[facility] Progress Note  7 Days Post-Op  Subjective: CC:  Dr. Lake Bells and Dr. Isac Sarna from CCM are at bedside speaking with pts son. Pt w/ bloody NG output. Pt not arousing to my voice.   Hypotension improved on levophed and vasopressin. Objective: Vital signs in last 24 hours: Temp:  [95.4 F (35.2 C)-98.2 F (36.8 C)] 96.2 F (35.7 C) (04/30 1137) Pulse Rate:  [73-103] 88 (04/30 1023) Resp:  [14-30] 16 (04/30 1100) BP: (94-141)/(38-82) 141/54 (04/30 1100) SpO2:  [92 %-100 %] 100 % (04/30 1200) FiO2 (%):  [30 %] 30 % (04/30 1200) Weight:  [60.9 kg (134 lb 4.2 oz)] 60.9 kg (134 lb 4.2 oz) (04/30 0500) Last BM Date: 09/16/17  Intake/Output from previous day: 04/29 0701 - 04/30 0700 In: 2896.4 [I.V.:2496.4; IV Piggyback:75] Out: 2935 [Emesis/NG output:545; Drains:210] Intake/Output this shift: Total I/O In: 1343.1 [I.V.:472.3; Blood:702.3; IV Piggyback:168.5] Out: 4403 [Emesis/NG output:150; Drains:45; KVQQV:9563]  PE: Gen:  Sedated, ill-appearing HEENT: trach in place on full vent support, NG tube in place - bloody drainage in cannister. 545 cc/24h Card:  Regular rate and rhythm, no murmurs  Pulm:  CTAB, ventilated respirations Abd: Soft, facial grimace to palpation of abdomen, hypoactive BS, drains c/d/i 210 cc bilious and sanguinous drainage Skin: warm and dry, no rashes   MSK: edema of bilateral upper and lower extremities present Neuro: on vent, not following commands   Lab Results:  Recent Labs    09/16/17 0510 09/17/17 0523  WBC 16.5* 17.3*  HGB 10.1* 7.0*  HCT 31.3* 21.9*  PLT 33* 31*   BMET Recent Labs    09/16/17 1835 09/17/17 0523  NA 135 137  K 4.7 5.1  CL 100* 101  CO2 25 25  GLUCOSE 173* 168*  BUN 28* 24*  CREATININE 0.68 0.56  CALCIUM 9.2 9.4   PT/INR Recent Labs    09/16/17 0510 09/17/17 0523  LABPROT 22.4* 21.3*  INR 1.98 1.86   CMP     Component Value Date/Time   NA 137 09/17/2017 0523   NA 142  02/12/2017 1400   K 5.1 09/17/2017 0523   K 3.9 02/12/2017 1400   CL 101 09/17/2017 0523   CO2 25 09/17/2017 0523   CO2 30 (H) 02/12/2017 1400   GLUCOSE 168 (H) 09/17/2017 0523   GLUCOSE 88 02/12/2017 1400   BUN 24 (H) 09/17/2017 0523   BUN 16.9 02/12/2017 1400   CREATININE 0.56 09/17/2017 0523   CREATININE 6.2 (HH) 02/12/2017 1400   CALCIUM 9.4 09/17/2017 0523   CALCIUM 9.2 02/12/2017 1400   PROT 5.7 (L) 09/17/2017 0523   PROT 6.8 02/12/2017 1400   ALBUMIN 2.7 (L) 09/17/2017 0523   ALBUMIN 3.3 (L) 02/12/2017 1400   AST 165 (H) 09/17/2017 0523   AST 13 02/12/2017 1400   ALT 102 (H) 09/17/2017 0523   ALT 7 02/12/2017 1400   ALKPHOS 302 (H) 09/17/2017 0523   ALKPHOS 75 02/12/2017 1400   BILITOT 25.0 (HH) 09/17/2017 0523   BILITOT 0.68 02/12/2017 1400   GFRNONAA >60 09/17/2017 0523   GFRAA >60 09/17/2017 0523   Lipase     Component Value Date/Time   LIPASE 36 09/04/2017 1618       Studies/Results: Dg Chest Port 1 View  Result Date: 09/17/2017 CLINICAL DATA:  Tracheostomy tube. EXAM: PORTABLE CHEST 1 VIEW COMPARISON:  09/16/2017. FINDINGS: Tracheostomy tube, right IJ line, NG to in stable position. Ureteral stents appear to be in stable position. Left mid lung field and  bibasilar subsegmental atelectasis and/or scarring. No pleural effusion or pneumothorax. Heart size normal. Degenerative change thoracic spine. Surgical clips left chest. IMPRESSION: 1.  Lines and tubes in stable position. 2. Mild left mid lung field and bibasilar subsegmental atelectasis and/or scarring. Electronically Signed   By: Marcello Moores  Register   On: 09/17/2017 09:24   Dg Chest Port 1 View  Result Date: 09/16/2017 CLINICAL DATA:  Tracheostomy. EXAM: PORTABLE CHEST 1 VIEW COMPARISON:  Radiograph of September 15, 2017. FINDINGS: The heart size and mediastinal contours are within normal limits. Atherosclerosis of thoracic aorta is noted. Tracheostomy tube is in grossly good position. Right internal jugular  catheter is again noted with tip in expected position of the SVC. Nasogastric tube is seen entering the stomach. No pneumothorax is noted. Minimal bibasilar subsegmental atelectasis is noted. The visualized skeletal structures are unremarkable. IMPRESSION: Tracheostomy tube in good position. Stable minimal bibasilar subsegmental atelectasis. Aortic Atherosclerosis (ICD10-I70.0). Electronically Signed   By: Marijo Conception, M.D.   On: 09/16/2017 07:32   Dg Abd Portable 1v  Result Date: 09/15/2017 CLINICAL DATA:  Feeding tube placement. EXAM: PORTABLE ABDOMEN - 1 VIEW COMPARISON:  CT, 09/08/2017. FINDINGS: Nasogastric tube passes well below the diaphragm. Tip projects in the distal stomach or first portion of the duodenum. Percutaneous pigtail catheters are noted, 1 projecting in the epigastric region the other in the right upper quadrant along the inferior margin of the liver. Biliary stent is noted. IMPRESSION: Well-positioned nasogastric tube. Electronically Signed   By: Lajean Manes M.D.   On: 09/15/2017 18:11    Anti-infectives: Anti-infectives (From admission, onward)   Start     Dose/Rate Route Frequency Ordered Stop   09/18/17 1100  anidulafungin (ERAXIS) 100 mg in sodium chloride 0.9 % 100 mL IVPB     100 mg 78 mL/hr over 100 Minutes Intravenous Every 24 hours 09/17/17 0858     09/17/17 1100  anidulafungin (ERAXIS) 200 mg in sodium chloride 0.9 % 200 mL IVPB     200 mg 78 mL/hr over 200 Minutes Intravenous  Once 09/17/17 0858     09/08/17 2000  piperacillin-tazobactam (ZOSYN) IVPB 3.375 g  Status:  Discontinued     3.375 g 100 mL/hr over 30 Minutes Intravenous Every 6 hours 09/08/17 1946 09/16/17 1151   09/05/17 2000  piperacillin-tazobactam (ZOSYN) IVPB 3.375 g     3.375 g 12.5 mL/hr over 240 Minutes Intravenous Every 12 hours 09/05/17 1126 09/07/17 0154   08/31/17 0800  piperacillin-tazobactam (ZOSYN) IVPB 3.375 g  Status:  Discontinued     3.375 g 100 mL/hr over 30 Minutes  Intravenous Every 6 hours 08/31/17 0739 09/05/17 1126   08/30/17 2000  Ampicillin-Sulbactam (UNASYN) 3 g in sodium chloride 0.9 % 100 mL IVPB  Status:  Discontinued     3 g 200 mL/hr over 30 Minutes Intravenous Every 24 hours 08/30/2017 0953 08/30/17 0945   08/30/17 1230  piperacillin-tazobactam (ZOSYN) IVPB 3.375 g  Status:  Discontinued     3.375 g 12.5 mL/hr over 240 Minutes Intravenous Every 12 hours 08/30/17 1134 08/31/17 0738   09/16/2017 1030  Ampicillin-Sulbactam (UNASYN) 3 g in sodium chloride 0.9 % 100 mL IVPB     3 g 200 mL/hr over 30 Minutes Intravenous  Once 09/15/2017 0953 08/26/2017 1246   08/19/2017 0000  ampicillin-sulbactam (UNASYN) 1.5 g in sodium chloride 0.9 % 100 mL IVPB  Status:  Discontinued     1.5 g 200 mL/hr over 30 Minutes Intravenous Once 08/28/17 1045 09/04/2017 0928  09/08/2017 2200  piperacillin-tazobactam (ZOSYN) IVPB 3.375 g    Note to Pharmacy:  Zosyn 3.375 g IV q12h in ESRD on HD   3.375 g 12.5 mL/hr over 240 Minutes Intravenous Every 12 hours 09/13/2017 1155 08/25/17 1335   08/22/17 1000  piperacillin-tazobactam (ZOSYN) IVPB 3.375 g  Status:  Discontinued    Note to Pharmacy:  Zosyn 3.375 g IV q12h in ESRD on HD   3.375 g 12.5 mL/hr over 240 Minutes Intravenous Every 12 hours 08/22/17 0128 09/16/2017 1155   08/22/17 0045  piperacillin-tazobactam (ZOSYN) IVPB 3.375 g     3.375 g 12.5 mL/hr over 240 Minutes Intravenous  Once 08/22/17 0038 08/22/17 0145     Assessment/Plan HTN ESRD - HD MWF; now on CVVHD H/o breast cancer  Septic shock  Acute cholecystitis s/p lap chole4/6/19 by Dr. Redmond Pulling - CT scan 4/8 w/ small amount fluid around liver and layering in pelvis ; HIDA 4/9 positive for bile leak - CT 4/21 with multiple fluid collections -additional drain placed 4/24; Cx negative -ERCP 4/23 w stent placement (Dr Fuller Plan); erosive gastritis noted - continue PPI - continue drains  Shock liver Hyperbilirubinemia - drains stable, suspect 2/2 prolonged  TNA/cholestasis Leukocytosis - 17,300 Severe protein-calorie malnutrition - TNA   VDRF secondary to aspiration/PNA-PNA resolved and CCMfollowing - appreciate assistance.  - s/p trach 4/28 on full vent support   NSTEMI 4/12 LUE DVT 4/17  ABL anemia- hgb/hct 7.0/21.9 from 10.1/31.3; CCM giving 1u pRBCs/1u PLT Thrombocytopenia - 31,000   ID -zosyn 4/4-4/7,Unasyn 4/11-4/12, Zosyn 4/12 -4/19, Zosyn 4/21-4/29, Eraxis 4/30 >>  FEN -NPO/NGT to LIWS VTE -SCDs Foley -none, anuric Follow up -Dr. Redmond Pulling  Dispo: worsening shock on 2 pressors and now with presumed UGI bleed. receiving blood products and adding antifungals per CCM. Repeat blood cultures pending. Continue NPO/TNA. Continue abdominal drains. Surgery will follow.   LOS: 26 days    Jill Alexanders , [facility] 09/17/2017, 12:57 PM Pager: [phone] Consults: [phone] Mon-Fri 7:00 am-4:30 pm Sat-Sun 7:00 am-11:30 am

## 2017-09-17 NOTE — Progress Notes (Signed)
[facility] Progress Note  Subjective: on levo gtt and now also vaso gtt  tbili up to 25 plts down to 31k  Vitals:   09/17/17 0956 09/17/17 1000 09/17/17 1023 09/17/17 1100  BP:  122/82 (!) 136/54 (!) 141/54  Pulse:   88   Resp:  20  16  Temp: (!) 97.1 F (36.2 C)  (!) 97.2 F (36.2 C)   TempSrc: Axillary  Axillary   SpO2:  100% 100% 100%  Weight:      Height:        Inpatient medications: . chlorhexidine gluconate (MEDLINE KIT)  15 mL Mouth Rinse BID  . Chlorhexidine Gluconate Cloth  6 each Topical Daily  . darbepoetin (ARANESP) injection - DIALYSIS  150 mcg Subcutaneous Q Mon-1800  . doxercalciferol  2 mcg Intravenous Q M,W,F-1800  . hydrocortisone sod succinate (SOLU-CORTEF) inj  50 mg Intravenous Q6H  . insulin aspart  0-9 Units Subcutaneous Q6H  . mouth rinse  15 mL Mouth Rinse Q4H  . pantoprazole (PROTONIX) IV  40 mg Intravenous Q12H  . sodium chloride flush  10-40 mL Intracatheter Q12H  . sodium chloride flush  5 mL Intracatheter Q8H   . sodium chloride Stopped (09/17/17 0800)  . [START ON 09/18/2017] anidulafungin    . anidulafungin 200 mg (09/17/17 1105)  . feeding supplement (VITAL HIGH PROTEIN) Stopped (09/13/17 1025)  . fentaNYL infusion INTRAVENOUS 150 mcg/hr (09/17/17 0816)  . norepinephrine (LEVOPHED) Adult infusion 10 mcg/min (09/17/17 1100)  . dialysis replacement fluid (prismasate) 400 mL/hr at 09/17/17 0129  . dialysis replacement fluid (prismasate) 200 mL/hr at 09/16/17 1155  . dialysate (PRISMASATE) 1,600 mL/hr at 09/17/17 1055  . sodium chloride 999 mL/hr at 09/15/17 0119  . TPN ADULT (ION) 50 mL/hr at 09/16/17 1900  . TPN ADULT (ION)    . vasopressin (PITRESSIN) infusion - *FOR SHOCK* 0.03 Units/min (09/16/17 1405)   albuterol, alteplase, docusate, fentaNYL (SUBLIMAZE) injection, heparin, midazolam, sodium chloride  Exam: On vent, cachectic not responsive trach in place No jvd Chest coarse bilat RRR no mrg abd soft ntnd  abd  drains in place Ext 1-2+ edema upper extremities nf not following commands eyes open lua avg w bruit   [facility]mn  61.5kg   2/2.25 bath p4  AVG  Heparin 4000 - hect 2 ug q hd iv - mircera 30 ug every 2 wks iv at dialysis       Impression: 1 acute cholecystitis - sp lap chole / bile leak 2 septic shock on 2 pressors now 3 ^lfts w tbili 25 now nodular liver on ct question cirrhosis 4 esrd on hd [facility]wf getting crrt will keep 50 cc/hr + for now 5 volume will keep +50 /hr keeping even on crrt 6 vdrf - sp trach now 7 Anemia ckd/ abl - recurrent gi bleeds here sp prbc's    Plan - as above   RKelly SplinterMD [facility] pager [phone]  09/17/2017, 11:20 AM   Recent Labs  Lab 09/16/17 0510 09/16/17 1835 09/17/17 0523  NA 135 135 137  K 4.4 4.7 5.1  CL 102 100* 101  CO2 _0 GLUCOSE 161* 173* 168*  BUN 28* 28* 24*  CREATININE 0.72 0.68 0.56  CALCIUM 8.7* 9.2 9.4  PHOS 3.6 3.5 3.5   Recent Labs  Lab 09/14/17 0428  09/15/17 1534  09/16/17 0510 09/16/17 1835 09/17/17 0523  AST 216*  --   --   --  142*  --  165*  ALT 172*  --   --   --  120*  --  102*  ALKPHOS 455*  --   --   --  432*  --  302*  BILITOT 16.8*  --  20.8*  --  19.3*  --  25.0*  PROT 6.4*  --   --   --  6.0*  --  5.7*  ALBUMIN 2.1*   < >  --    < > 1.6* 3.4* 2.7*   < > = values in this interval not displayed.   Recent Labs  Lab 09/15/17 0321 09/16/17 0510 09/17/17 0523  WBC 12.1* 16.5* 17.3*  NEUTROABS  --  14.4*  --   HGB 10.0* 10.1* 7.0*  HCT 31.2* 31.3* 21.9*  MCV 94.0 97.2 100.0  PLT 50* 33* 31*   Iron/TIBC/Ferritin/ %Sat    Component Value Date/Time   IRON 84 04/15/2010 1030   TIBC 178 (L) 04/15/2010 1030   FERRITIN 784 (H) 04/15/2010 1030   IRONPCTSAT 47 04/15/2010 1030

## 2017-09-17 NOTE — Progress Notes (Signed)
[facility]  I had a lengthy conversation with the patient's son Mateo Flow this afternoon.  I updated him about his mother's decompensation in the last 48 hours, specifically the drop in blood pressure and the last 48 hours.  I explained that the troponin elevation is not as high as before and I don't suspect the bleeding to be the only cause of her hypotension.  We have made changes in her management today to treat a possible upper GI bleed, transfused blood and added back antibiotic and antifungal treatment.  Will check an Echo and CT scan of her chest.  I explained to him that while we are trying to help her get through this, I am very worried that she will not survive because of all the problems she has experienced during this hospitalization and her advanced age.  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  I explained that I fear we are reaching a point where the procedures we perform have little medical benefit and are causing harm.  I asked him about CPR and chest compressions and he indicated that he didn't think that he wanted her to go through that because it would break her ribs, but he needed to talk to his brother about it first.    Remains full code  Roselie Awkward, MD [place] [facility] Pager: [phone] Cell: [phone] After 3pm or if no response, call [phone]

## 2017-09-18 ENCOUNTER — Inpatient Hospital Stay (HOSPITAL_COMMUNITY): Payer: Medicare Other

## 2017-09-18 DIAGNOSIS — K810 Acute cholecystitis: Secondary | ICD-10-CM

## 2017-09-18 DIAGNOSIS — R0600 Dyspnea, unspecified: Secondary | ICD-10-CM

## 2017-09-18 DIAGNOSIS — J9601 Acute respiratory failure with hypoxia: Secondary | ICD-10-CM

## 2017-09-18 DIAGNOSIS — T17908S Unspecified foreign body in respiratory tract, part unspecified causing other injury, sequela: Secondary | ICD-10-CM

## 2017-09-18 DIAGNOSIS — K2970 Gastritis, unspecified, without bleeding: Secondary | ICD-10-CM

## 2017-09-18 DIAGNOSIS — D62 Acute posthemorrhagic anemia: Secondary | ICD-10-CM

## 2017-09-18 LAB — BPAM RBC
Blood Product Expiration Date: 201905262359
ISSUE DATE / TIME: 201904300928
Unit Type and Rh: 9500

## 2017-09-18 LAB — RENAL FUNCTION PANEL
ALBUMIN: 2.5 g/dL — AB (ref 3.5–5.0)
Anion gap: 8 (ref 5–15)
BUN: 33 mg/dL — AB (ref 6–20)
CALCIUM: 9.1 mg/dL (ref 8.9–10.3)
CHLORIDE: 104 mmol/L (ref 101–111)
CO2: 25 mmol/L (ref 22–32)
CREATININE: 0.55 mg/dL (ref 0.44–1.00)
GFR calc Af Amer: >60 mL/min (ref >60)
Glucose, Bld: 204 mg/dL — ABNORMAL HIGH (ref 65–99)
Phosphorus: 3.4 mg/dL (ref 2.5–4.6)
Potassium: 4.3 mmol/L (ref 3.5–5.1)
SODIUM: 137 mmol/L (ref 135–145)

## 2017-09-18 LAB — GLUCOSE, CAPILLARY
GLUCOSE-CAPILLARY: 168 mg/dL — AB (ref 65–99)
GLUCOSE-CAPILLARY: 169 mg/dL — AB (ref 65–99)
GLUCOSE-CAPILLARY: 190 mg/dL — AB (ref 65–99)
Glucose-Capillary: 206 mg/dL — ABNORMAL HIGH (ref 65–99)
Glucose-Capillary: 182 mg/dL — ABNORMAL HIGH (ref 65–99)
Glucose-Capillary: 175 mg/dL — ABNORMAL HIGH (ref 65–99)
Glucose-Capillary: 152 mg/dL — ABNORMAL HIGH (ref 65–99)

## 2017-09-18 LAB — CBC
HEMATOCRIT: 27.1 % — AB (ref 36.0–46.0)
HEMOGLOBIN: 8.6 g/dL — AB (ref 12.0–15.0)
MCH: 30.9 pg (ref 26.0–34.0)
MCHC: 31.7 g/dL (ref 30.0–36.0)
MCV: 97.5 fL (ref 78.0–100.0)
Platelets: 55 10*3/uL — ABNORMAL LOW (ref 150–400)
RBC: 2.78 MIL/uL — AB (ref 3.87–5.11)
RDW: 23.2 % — ABNORMAL HIGH (ref 11.5–15.5)
WBC: 17.6 10*3/uL — ABNORMAL HIGH (ref 4.0–10.5)

## 2017-09-18 LAB — TYPE AND SCREEN
ABO/RH(D): O NEG
Antibody Screen: NEGATIVE
Unit division: 0

## 2017-09-18 LAB — COMPREHENSIVE METABOLIC PANEL
ALBUMIN: 2.6 g/dL — AB (ref 3.5–5.0)
ALK PHOS: 299 U/L — AB (ref 38–126)
ALT: 92 U/L — AB (ref 14–54)
ANION GAP: 9 (ref 5–15)
AST: 131 U/L — AB (ref 15–41)
BILIRUBIN TOTAL: 24.1 mg/dL — AB (ref 0.3–1.2)
BUN: 24 mg/dL — AB (ref 6–20)
CO2: 28 mmol/L (ref 22–32)
Calcium: 9.3 mg/dL (ref 8.9–10.3)
Chloride: 100 mmol/L — ABNORMAL LOW (ref 101–111)
Creatinine, Ser: 0.39 mg/dL — ABNORMAL LOW (ref 0.44–1.00)
GFR calc Af Amer: >60 mL/min (ref >60)
GFR calc non Af Amer: >60 mL/min (ref >60)
GLUCOSE: 185 mg/dL — AB (ref 65–99)
Potassium: 4.4 mmol/L (ref 3.5–5.1)
SODIUM: 137 mmol/L (ref 135–145)
TOTAL PROTEIN: 5.6 g/dL — AB (ref 6.5–8.1)

## 2017-09-18 LAB — PREPARE PLATELET PHERESIS: Unit division: 0

## 2017-09-18 LAB — PHOSPHORUS: PHOSPHORUS: 3 mg/dL (ref 2.5–4.6)

## 2017-09-18 LAB — ECHOCARDIOGRAM COMPLETE
Height: 62 in
Weight: 2084.67 oz

## 2017-09-18 LAB — BPAM PLATELET PHERESIS
Blood Product Expiration Date: 201905022359
ISSUE DATE / TIME: 201904300746
Unit Type and Rh: 7300

## 2017-09-18 LAB — PROTIME-INR
INR: 2.12
Prothrombin Time: 23.5 seconds — ABNORMAL HIGH (ref 11.4–15.2)

## 2017-09-18 LAB — MAGNESIUM: Magnesium: 2.3 mg/dL (ref 1.7–2.4)

## 2017-09-18 LAB — PROCALCITONIN: Procalcitonin: 4.1 ng/mL

## 2017-09-18 MED ORDER — IOHEXOL 300 MG/ML  SOLN
100.0000 mL | Freq: Once | INTRAMUSCULAR | Status: AC | PRN
  Administered 2017-09-18: 100 mL via INTRAVENOUS

## 2017-09-18 MED ORDER — ZINC TRACE METAL 1 MG/ML IV SOLN
INTRAVENOUS | Status: AC
  Administered 2017-09-18: 17:00:00 via INTRAVENOUS
  Filled 2017-09-18: qty 760

## 2017-09-18 MED ORDER — INSULIN ASPART 100 UNIT/ML ~~LOC~~ SOLN
0.0000 [IU] | SUBCUTANEOUS | Status: DC
  Administered 2017-09-18: 5 [IU] via SUBCUTANEOUS
  Administered 2017-09-18 – 2017-09-19 (×8): 3 [IU] via SUBCUTANEOUS
  Administered 2017-09-19 – 2017-09-20 (×2): 2 [IU] via SUBCUTANEOUS
  Administered 2017-09-20 (×2): 3 [IU] via SUBCUTANEOUS
  Administered 2017-09-20: 5 [IU] via SUBCUTANEOUS
  Administered 2017-09-20 (×2): 2 [IU] via SUBCUTANEOUS
  Administered 2017-09-21 (×2): 3 [IU] via SUBCUTANEOUS
  Administered 2017-09-21: 2 [IU] via SUBCUTANEOUS
  Administered 2017-09-22 (×2): 3 [IU] via SUBCUTANEOUS
  Administered 2017-09-22 (×3): 2 [IU] via SUBCUTANEOUS

## 2017-09-18 NOTE — Progress Notes (Signed)
RT note- patients family in room, RR elevated, placed back to full support.

## 2017-09-18 NOTE — Progress Notes (Signed)
RT note-Patient weaned on PS+14 for 8 hours

## 2017-09-18 NOTE — Progress Notes (Signed)
[facility] Progress Note  Subjective: on pressors , dosing is down some  Vitals:   09/18/17 1500 09/18/17 1530 09/18/17 1600 09/18/17 1602  BP: (!) 123/51  (!) 127/58   Pulse: 86     Resp: 19  (!) 23   Temp:  (!) 97.3 F (36.3 C)    TempSrc:  Axillary    SpO2: 100%  100% 100%  Weight:      Height:        Inpatient medications: . chlorhexidine gluconate (MEDLINE KIT)  15 mL Mouth Rinse BID  . Chlorhexidine Gluconate Cloth  6 each Topical Daily  . darbepoetin (ARANESP) injection - DIALYSIS  150 mcg Subcutaneous Q Mon-1800  . doxercalciferol  2 mcg Intravenous Q M,W,F-1800  . hydrocortisone sod succinate (SOLU-CORTEF) inj  50 mg Intravenous Q6H  . insulin aspart  0-15 Units Subcutaneous Q4H  . mouth rinse  15 mL Mouth Rinse QID  . pantoprazole (PROTONIX) IV  40 mg Intravenous Q12H  . sodium chloride flush  10-40 mL Intracatheter Q12H   . sodium chloride 10 mL/hr at 09/18/17 1600  . anidulafungin Stopped (09/18/17 1256)  . fentaNYL infusion INTRAVENOUS 13 mcg/hr (09/18/17 1600)  . meropenem (MERREM) IV Stopped (09/18/17 1038)  . norepinephrine (LEVOPHED) Adult infusion 5.973 mcg/min (09/18/17 1600)  . dialysis replacement fluid (prismasate) 400 mL/hr at 09/18/17 0325  . dialysis replacement fluid (prismasate) 200 mL/hr at 09/18/17 1032  . dialysate (PRISMASATE) 1,600 mL/hr at 09/18/17 1033  . sodium chloride 999 mL/hr at 09/15/17 0119  . TPN ADULT (ION) 50 mL/hr at 09/18/17 1600  . TPN ADULT (ION)    . vasopressin (PITRESSIN) infusion - *FOR SHOCK* Stopped (09/17/17 1500)   albuterol, alteplase, docusate, fentaNYL (SUBLIMAZE) injection, heparin, midazolam, sodium chloride  Exam: On vent, not responsive trach in place No jvd Chest coarse bilat RRR no mrg abd soft ntnd  abd drains in place Ext 1-2+ edema upper extremities nf not following commands eyes open lua avg w bruit   [facility] 3[facility] 31mn  61.5kg   2/2.25 bath p4  AVG  Heparin 4000 -  hect 2 ug q hd iv - mircera 30 ug every 2 wks iv at dialysis       Impression: 1 cholecystitis: sp lap chole w bile leak and 2 drains in place 2 jaundice w suspected cirrhosis and liver failure 3 abd fluid collections and abscesses 4 septic shock pressors down 5 esrd usual gets hd [facility], cont crrt keep +50 cc/hr 6 vdrf - sp trach 7 anemia ckd/ abl 8 gi bleed gastritis seen at ercp, sp prbc's x8 and ffp x 16 9 prognosis - very poor   Plan - as above   RKelly SplinterMD [facility] pager [phone]  09/18/2017, 4:39 PM   Recent Labs  Lab 09/17/17 1746 09/18/17 0446 09/18/17 1532  NA 138 137 137  K 4.9 4.4 4.3  CL 101 100* 104  CO2 25 28 25   GLUCOSE 209* 185* 204*  BUN 25* 24* 33*  CREATININE 0.51 0.39* 0.55  CALCIUM 9.6 9.3 9.1  PHOS 4.1 3.0 3.4   Recent Labs  Lab 09/16/17 0510  09/17/17 0523 09/17/17 1746 09/18/17 0446 09/18/17 1532  AST 142*  --  165*  --  131*  --   ALT 120*  --  102*  --  92*  --   ALKPHOS 432*  --  302*  --  299*  --   BILITOT 19.3*  --  25.0*  --  24.1*  --   PROT 6.0*  --  5.7*  --  5.6*  --   ALBUMIN 1.6*   < > 2.7* 3.1* 2.6* 2.5*   < > = values in this interval not displayed.   Recent Labs  Lab 09/16/17 0510 09/17/17 0523 09/17/17 1145 09/18/17 0446  WBC 16.5* 17.3* 18.0* 17.6*  NEUTROABS 14.4*  --   --   --   HGB 10.1* 7.0* 8.1* 8.6*  HCT 31.3* 21.9* 25.0* 27.1*  MCV 97.2 100.0 96.5 97.5  PLT 33* 31* 82* 55*   Iron/TIBC/Ferritin/ %Sat    Component Value Date/Time   IRON 84 04/15/2010 1030   TIBC 178 (L) 04/15/2010 1030   FERRITIN 784 (H) 04/15/2010 1030   IRONPCTSAT 47 04/15/2010 1030

## 2017-09-18 NOTE — Progress Notes (Addendum)
PULMONARY / CRITICAL CARE MEDICINE   Name: Pam WITHEM MRN: [id number] DOB: [birth year]    ADMISSION DATE:  08/23/2017 CONSULTATION DATE:  4/11  REFERRING MD:  Dr. Maudie Mercury  CHIEF COMPLAINT:  Aspiration  BRIEF SUMMARY:   [name] is a 84 year old female with ESRD on MWF HD, HTN, Anemia, and hx of breast cancer who presented to the ED on 4/4 with RLQ abdominal pain. She was found to have acute cholecystitis with a gallstone lodged in the gallbladder neck. CBD measured up to 10 mm. She underwent laparoscopic cholecystectomy on 4/6. She initially did well but on 4/8 was noted to have worsened pain and confusion and thought to have post-op ileus. CT abd/pelvis 4/8 showed small-mod fluid in the abdominal pelvis, read as normal post op changes but could not exclude a bile leak. A hepatobiliary scan was obtained on 4/9 which did suggest a bile leak. Surgery recommended IR drain and GI consult for ERCP. She went to the Endoscopy unit on 4/11 AM for ERCP however during prep with anesthesia she had significant bilious emesis with hypotension requiring intubation and pressor support. ERCP was not attempted. She was subsequently transferred to the medical ICU.   STUDIES:  CT abd/pelv 4/4 >> acute cholecystitis, gallstone in GB neck, CBD 10 mm  CT Head: no acute changes, chronic small vessel ischemic changes  CT abd/pelvis 4/8 >> small-mod fluid in abd/pelvis  Hepatobiliary scan 4/9 >> bile leak  TTE 4/12 >> EF 65-70%, G1DD, narrow LVOT, mild TR, mild pHTN  U/S Abdomen 4/14 >> small fluid near cholecystectomy site  Hepatobiliary scan 4/14 >> Rt subhepatic fluid collection consistent w/ biloma; increased in size from prior  CT abd/pelvis w/ oral contrast 4/16 >> drainage catheter at inferior liver margin with decreased perihepatic fluid; low-density fluid at left subdiaphragmatic area, questionable colonic wall thickening throughout, multifocal airspace disease of lung bases  CT  abdomen.pelvis 4/21: interval growth of large perihepatic and perisplenic fluid collection with multiple, new intraperitoneal fluid collections. Resolving multifocal pneumonia.   CULTURES: Tracheal aspirate 4/11 >> no growth Abd fluid 4/11 >> no growth 5 days Abd fluid 4/15 >> no growth 3 days  ANTIBIOTICS: Zosyn 4/3 >> 4/7 Unasyn 4/11 Zosyn 4/12 - 4/19 Zosyn 4/21-4/29 09/17/2017 meropenem 09/17/2017 right axis   LINES/TUBES: ETT 4/11>4/28 OGT 4/11>4/28 PIV RUE 4/9 RLQ Biliary Drain 4/11 HD Cath LIJ 4/12 >> 4/19 L femoral trialysis catheter 4/21> RIJ cath 4/23>  Tracheostomy 4/28>  SIGNIFICANT EVENTS: 4/4 - admitted w/ acute cholecystitis 4/6 - lap chole 4/9 - bile leak seen on hepatobiliary scan 4/11 - Aspirated during prep for ERCP, intubated and transferred to ICU 4/11 - Rt abd fluid drain placed by IR 4/12- Heparin gtt started for NSTEMI; held after OG bleeding and trop peak 4/13 - worsening liver enzyymes. ESRD - Anuric. CRRT started. On levophed 33mcg and fent 148mcg 4/14 - Hgb 6.0, transfused 2 units PRBCs 4/17 - DVT LUE - started bivalirudin for suspected HIT 4/18 - CVVH d/c'ed, return to intermittent HD.  4/19 - Restarted HD. HIT negative  4/20 - Bleeding from GB drain, OGT, and stools. Received 1u pRBC. Heparin gtt held. 4/21 - Multiple, new fluid collections found on CT abd/pelvis   4/23 - ERCP with cystic duct bile leak s/p stent placement, erosive gastritis, and CBD dilation  4/24 - Mid-abdomen perc drain placed by IR  4/26 - Coffee-ground emesis after restarting tube feeds  4/28 - Tracheostomy  4/29 - Significant bloody output  from NGT with drop in Hgb 10->7 09/17/2017 CT of the abdomen ordered and pending as of 09/18/2017 09/18/2017 palliative care consult placed   SUBJECTIVE/OVERNIGHT/INTERVAL HX -   Significant change in the patient who is receiving essentially futile care.    VITAL SIGNS: BP (!) 116/49   Pulse 98   Temp (!) 97.2 F (36.2 C)  (Axillary)   Resp 20   Ht 5\' 2"  (1.575 m)   Wt 59.1 kg (130 lb 4.7 oz)   SpO2 99%   BMI 23.83 kg/m   HEMODYNAMICS:    VENTILATOR SETTINGS: Vent Mode: PSV;CPAP FiO2 (%):  [30 %] 30 % Set Rate:  [14 bmp] 14 bmp Vt Set:  [400 mL] 400 mL PEEP:  [5 cmH20] 5 cmH20 Pressure Support:  [14 cmH20] 14 cmH20 Plateau Pressure:  [17 cmH20-25 cmH20] 17 cmH20  INTAKE / OUTPUT: I/O last 3 completed shifts: In: 4670.5 [I.V.:3421.7; Blood:702.3; IV Piggyback:546.5] Out: 3329 [Emesis/NG output:645; Drains:350; Other:2845]  PHYSICAL EXAMINATION:  General: Frail ill-appearing female HEENT: Discussed with the ventilator, G-tube with bloody drainage Neuro: Sedated minimally responsive CV: Sounds are regular PULM: Decreased breath sounds throughout GI: No bowel sounds, bowel drain is in place.  Abdomen remains tender. Extremities: warm/dry, 2+ edema  Skin: no rashes or lesions    PULMONARY No results for input(s): PHART, PCO2ART, PO2ART, HCO3, TCO2, O2SAT in the last 168 hours.  Invalid input(s): PCO2, PO2 CBC Recent Labs  Lab 09/17/17 0523 09/17/17 1145 09/18/17 0446  HGB 7.0* 8.1* 8.6*  HCT 21.9* 25.0* 27.1*  WBC 17.3* 18.0* 17.6*  PLT 31* 82* 55*   COAGULATION Recent Labs  Lab 09/14/17 0428 09/15/17 0321 09/16/17 0510 09/17/17 0523 09/18/17 0446  INR 1.69 1.82 1.98 1.86 2.12   CARDIAC   Recent Labs  Lab 09/17/17 0905  TROPONINI 0.16*   No results for input(s): PROBNP in the last 168 hours.  CHEMISTRY Recent Labs  Lab 09/14/17 0428  09/15/17 0321  09/16/17 0510 09/16/17 1835 09/17/17 0523 09/17/17 1746 09/18/17 0446  NA 138   < > 136   < > 135 135 137 138 137  K 4.1   < > 3.9   < > 4.4 4.7 5.1 4.9 4.4  CL 101   < > 102   < > 102 100* 101 101 100*  CO2 27   < > 26   < > 24 25 25 25 28   GLUCOSE 172*   < > 136*   < > 161* 173* 168* 209* 185*  BUN 24*   < > 28*   < > 28* 28* 24* 25* 24*  CREATININE 0.87   < > 0.79   < > 0.72 0.68 0.56 0.51 0.39*  CALCIUM  9.1   < > 8.8*   < > 8.7* 9.2 9.4 9.6 9.3  MG 2.6*  --  2.4  --  2.3  --  2.3  --  2.3  PHOS 2.8   < > 2.7   < > 3.6 3.5 3.5 4.1 3.0   < > = values in this interval not displayed.   Estimated Creatinine Clearance: 42.9 mL/min (A) (by C-G formula based on SCr of 0.39 mg/dL (L)).  I/O last 3 completed shifts: In: 4670.5 [I.V.:3421.7; Blood:702.3; IV Piggyback:546.5] Out: 5188 [Emesis/NG output:645; Drains:350; Other:2845]  Intake/Output Summary (Last 24 hours) at 09/18/2017 1042 Last data filed at 09/18/2017 1037 Gross per 24 hour  Intake 2655.79 ml  Output 2189 ml  Net 466.79 ml   LIVER  Recent Labs  Lab 09/13/17 0325  09/14/17 0428  09/15/17 0321 09/15/17 1534  09/16/17 0510 09/16/17 1835 09/17/17 0523 09/17/17 1746 09/18/17 0446  AST 384*  --  216*  --   --   --   --  142*  --  165*  --  131*  ALT 275*  --  172*  --   --   --   --  120*  --  102*  --  92*  ALKPHOS 485*  --  455*  --   --   --   --  432*  --  302*  --  299*  BILITOT 12.4*  --  16.8*  --   --  20.8*  --  19.3*  --  25.0*  --  24.1*  PROT 5.8*  --  6.4*  --   --   --   --  6.0*  --  5.7*  --  5.6*  ALBUMIN 1.9*   < > 2.1*   < >  --   --    < > 1.6* 3.4* 2.7* 3.1* 2.6*  INR 2.06  --  1.69  --  1.82  --   --  1.98  --  1.86  --  2.12   < > = values in this interval not displayed.   INFECTIOUS Recent Labs  Lab 09/17/17 0905 09/18/17 0446  PROCALCITON 4.28 4.10   ENDOCRINE CBG (last 3)  Recent Labs    09/18/17 0003 09/18/17 0331 09/18/17 0606  GLUCAP 190* 169* 206*   IMAGING x48h  - image(s) personally visualized  -   highlighted in bold Dg Chest Port 1 View  Result Date: 09/18/2017 CLINICAL DATA:  Tracheostomy patient, dialysis dependent renal failure, septic shock EXAM: PORTABLE CHEST 1 VIEW COMPARISON:  Portable chest x-ray of September 17, 2017 FINDINGS: The lungs remain borderline hypoinflated. The interstitial markings are coarse but stable. There is no alveolar infiltrate. The heart and pulmonary  vascularity are normal. There is calcification in the wall of the aortic arch. The tracheostomy tube tip projects at the inferior margin of the clavicular heads. The right internal jugular venous catheter tip projects over the midportion of the SVC. The esophagogastric tube tip in proximal port project in below the GE junction. There a pigtail catheters bilaterally in the abdomen which are stable. IMPRESSION: Stable appearance of the chest.  No pneumonia nor CHF. Thoracic aortic atherosclerosis. Electronically Signed   By: David  Martinique M.D.   On: 09/18/2017 09:42   Dg Chest Port 1 View  Result Date: 09/17/2017 CLINICAL DATA:  Tracheostomy tube. EXAM: PORTABLE CHEST 1 VIEW COMPARISON:  09/16/2017. FINDINGS: Tracheostomy tube, right IJ line, NG to in stable position. Ureteral stents appear to be in stable position. Left mid lung field and bibasilar subsegmental atelectasis and/or scarring. No pleural effusion or pneumothorax. Heart size normal. Degenerative change thoracic spine. Surgical clips left chest. IMPRESSION: 1.  Lines and tubes in stable position. 2. Mild left mid lung field and bibasilar subsegmental atelectasis and/or scarring. Electronically Signed   By: Marcello Moores  Register   On: 09/17/2017 09:24     DISCUSSION: [name] is a 83 year old female with ESRD on MWF HD, HTN, Anemia, and hx of breast cancer who was admitted 4/4 with acute cholecystitis. S/p lap chole 4/6 complicated by post-op bile leak. Went for ERCP 4/11 however developed bilious emesis during prep, hypotension, and required intubation plus pressor support. Transferred to the ICU on 4/11.  09/18/2017  continues to require pressors, CVVH, blood transfusions, NG drainage is noted to be bloody.  No real change in her status she remains a full code at this time.  09/18/2017 palliative care consult placed.  ASSESSMENT / PLAN: PULMONARY A: Acute Respiratory Distress 2/2 Aspiration Requiring Mechanical Ventilation since 4/11  P:    Tracheostomy placed on 428 remains a full ventilatory support She is not weanable continue full vent support  CARDIOVASCULAR A:  Aortic Atherosclerosis on CXR RBBB - present prior to admission #current Septic Shock 2/2 biliary peritonitis   Troponin elevation (peak 4.47) 2/2 demand ischemia in setting of hypotensive event - resolved  Echocardiogram w/ EF 65-70% Prolonged QTc LUE DVT - heparin started 4/19 and stopped 4/21 P:  Currently on Levophed drip at 8.7 and increasing Continue antibiotics   RENAL  A:   ESRD on MWF HD AVF LUE P:   Restarted on CVVH on 421 Nephrology is following Continue CVVH  GASTROINTESTINAL A:   Acute Cholecystitis s/p lap chole 4/6 Bile leak/biliary peritonitis s/p IR Rt abdominal fluid drain 4/11 - biloma by HIDA 4/14 Shock Liver Multiple new intraperitoneal biloma found on 4/21 Severe protein calorie malnutrition  Erosive gastritis seen on ERCP 4/23  Coffee-ground emesis 4/26 SUP Hyperbilirubinemia - in the setting of sepsis and TPN  P:   GI is following Biliary drain is in place No significant change from 09/18/2017 TNA in place CT of the abdomen is pending as of 09/18/2017  HEMATOLOGIC A:   Hx of Breast Cancer - no acute issue Acute on chronic anemia (AOCD) - requiring multiple transfusions  Leukocytosis  Thrombocytopenia - Plt 31 LUE (IJV) DVT Acute blood loss anemia requiring multiple transfusions  Recent Labs    09/17/17 1145 09/18/17 0446  HGB 8.1* 8.6*    P:   Transfuse per protocol Injury drainage bloody at time No overt bloody stool GI is following no interventions planned at this time   INFECTIOUS A:   Septic Shock from Aspiration PNA - resolved  Bile Leak s/p IR Biliary Drain 4/11 Septic shock 2/2 new intraperitoneal abscesses/biloma Leukocytosis WBC 49 4/23 - likely leukemoid reaction  P:   Eraxis started 09/18/2017 Meropenem started 09/17/2017 No definitive blood cultures as of 09/18/2017 Biliary drains  with scant output  ENDOCRINE CBG (last 3)  Recent Labs    09/18/17 0003 09/18/17 0331 09/18/17 0606  GLUCAP 190* 169* 206*    A:   Secondary Hyperparathyroidism Hypoglycemia - likely 2/2 shock liver. Resolved  P:   Currently on TPN started on 423 CBG monitor Insulin as needed  NEUROLOGIC A:   Pain/Agitation P:   Sedate as necessary Goal of RA SS of 0 to -1 Sedation is to keep her from being uncomfortable  FAMILY  - Updates: 09/18/2017 no family at bedside.  Note 09/19/1998 19 pound of care consult was placed.    Richardson Landry Minor ACNP [facility] PCCM Pager [phone] till 1 pm If no answer page [phone] 09/18/2017, 10:43 AM

## 2017-09-18 NOTE — Progress Notes (Signed)
PHARMACY - ADULT TOTAL PARENTERAL NUTRITION CONSULT NOTE   Pharmacy Consult:  TPN Indication: Intolerance to enteral nutrition  Patient Measurements: Height: 5\' 2"  (157.5 cm) Weight: 130 lb 4.7 oz (59.1 kg) IBW/kg (Calculated) : 50.1 TPN AdjBW (KG): 61.7 Body mass index is 23.83 kg/m.  Assessment:  17 YOF presented on 08/25/2017 with abdominal pain and vomiting, found to have cholecystitis and she underwent cholecystomy on 09/13/2017.  She was on a clear liquid diet before and after surgery, but did not tolerate.  Imaging showed bile leak with fluid collection around the liver, and patient aspirated prior to planned ERCP on.  She was started on TF on 09/05/17 but did not tolerate and retried on 09/06/17 without success.  Pharmacy consulted to manage TPN for nutritional support.  GI: didn't tolerate TF again on 4/26, new UGIB 4/30 on PPI IV BID.  Prealbumin improved to 5, NG O/P 263mL, biloma O/P 125mL, LBM 4/29. Endo: hypoglycemic prior to TPN, CBGs now elevated with steroid Insulin requirements in the past 24 hours: 9 units SSI Lytes: CL slightly low, CoCa high normal, others WNL Renal: ESRD on MWF HD, CRRT restarted 4/21, BUN 24, net 5.3L - Aranesp, Hectorol Pulm: s/p trach 4/28 - FiO2 30%, HC started 4/30 Cards: HTN, s/p NSTEMI.  MAP 50-60s on Levophed, HR normal AC: s/p heparin for LUE DVT, bloody NG O/P - hgb 8.6, plts improved to 55, INR 2.12 s/p Vit K Hepatobil: shock liver s/p ERCP and CBD stent 4/23 - LFTs generally downtrending, TG 105 >> 171, tbili down slightly to 24.1 (typically associated with lipid > 1 g/kg/d, has been providing 0.45 g/kg/day) Neuro: Fentanyl gtt - GCS 11, CPOT 0, RASS -1 ID: Merrem/Eraxis for IAI, s/p Zosyn for PNA/bile leak - afebrile, WBC up 18, PCT 4.1 TPN Access: R IJ placed 09/06/2017 TPN start date: 09/08/17  Nutritional Goals (per RD rec on 4/30): 1250-1350 kCal and 110-125 gm protein per day  Current Nutrition:  TPN   Plan:  Continue TPN at 50 ml/hr,  providing 114g AA, 180g CHO, 28g ILE for a total of 1,344 kCal, meeting 100% of patient needs Electrolytes in TPN: reduce mag/ca slightly. Cl:Ac to 1:1, no other changes Daily multivitamin in TPN Remove multi-trace d/t liver injury - add zinc 5mg , chromium 19mcg, selenium 77mcg Change SSI to resistant Q4H while in steroid (was on sensitive Q6H with controlled CBGs) F/U AM labs    Pam Fuller, PharmD, BCPS, BCCCP Pager:  [phone] 09/18/2017, 8:07 AM

## 2017-09-18 NOTE — Progress Notes (Signed)
  Echocardiogram 2D Echocardiogram has been performed.  Luisa Louk T Holdyn Poyser 09/18/2017, 2:04 PM

## 2017-09-18 NOTE — Progress Notes (Signed)
   Patient Name: NILAYA BOUIE Date of Encounter: 09/18/2017, 9:31 AM    Subjective  Bloody NG output continues, low volume No signs of blood in stools On levophed - only pressor Biliary drain with 35 cc out yesterday after none or almost none x several days Remains on continuous ultrafiltration   Objective  BP 126/70   Pulse 98   Temp (!) 97.2 F (36.2 C) (Axillary)   Resp (!) 22   Ht 5\' 2"  (1.575 m)   Wt 130 lb 4.7 oz (59.1 kg)   SpO2 100%   BMI 23.83 kg/m  Acutely/chronically and critically ill On vent scleral icterus sedated  CBC Latest Ref Rng & Units 09/18/2017 09/17/2017 09/17/2017  WBC 4.0 - 10.5 K/uL 17.6(H) 18.0(H) 17.3(H)  Hemoglobin 12.0 - 15.0 g/dL 8.6(L) 8.1(L) 7.0(L)  Hematocrit 36.0 - 46.0 % 27.1(L) 25.0(L) 21.9(L)  Platelets 150 - 400 K/uL 55(L) 82(L) 31(L)   Lab Results  Component Value Date   ALT 92 (H) 09/18/2017   AST 131 (H) 09/18/2017   ALKPHOS 299 (H) 09/18/2017   BILITOT 24.1 (HH) 09/18/2017   Lab Results  Component Value Date   CREATININE 0.39 (L) 09/18/2017   BUN 24 (H) 09/18/2017   NA 137 09/18/2017   K 4.4 09/18/2017   CL 100 (L) 09/18/2017   CO2 28 09/18/2017      Assessment and Plan  Jaundice Bile leak treated with CBD stent Abdominal fluid collections and absesses Cirrhosis suspected and signs of liver failure Gastritis seen at ERCP  (likely cause of bloody NG output) Hypotension and ischemia  + Troponins Thrombocytopenia  She is same or worse with poor prognosis Do not think she needs EGD at this point  Await CT scan to see if that sheds light on things  Gatha Mayer, MD, [facility] 09/18/2017 9:31 AM '

## 2017-09-18 NOTE — Progress Notes (Signed)
[facility] Progress Note  8 Days Post-Op  Subjective: CC: sepsis with MSOF No bloody stools overnight. NGT with bloody drainage in tubing, on protonix. On the vent. Off vasopressin, still on levophed.   Objective: Vital signs in last 24 hours: Temp:  [96.2 F (35.7 C)-97.6 F (36.4 C)] 97.2 F (36.2 C) (05/01 0718) Pulse Rate:  [73-99] 73 (05/01 0730) Resp:  [12-30] 14 (05/01 0730) BP: (91-141)/(41-82) 109/42 (05/01 0730) SpO2:  [100 %] 100 % (05/01 0803) FiO2 (%):  [30 %] 30 % (05/01 0803) Weight:  [59.1 kg (130 lb 4.7 oz)] 59.1 kg (130 lb 4.7 oz) (05/01 0400) Last BM Date: 09/16/17  Intake/Output from previous day: 04/30 0701 - 05/01 0700 In: 3368.2 [I.V.:2119.4; Blood:702.3; IV Piggyback:546.5] Out: 2535 [Emesis/NG output:295; Drains:140] Intake/Output this shift: Total I/O In: 92.5 [I.V.:92.5] Out: 0   PE: Gen:  Sedated, ill-appearing HEENT: trach in place on full vent support, NG tube in place - bloody drainage in cannister. 295 cc/24h Card:  Regular rate and rhythm, no murmurs  Pulm:  CTAB, ventilated respirations Abd: Soft, hypoactive BS, drains c/d/i 140 cc bilious and sanguinous drainage Skin: warm and dry, no rashes   MSK: edema of bilateral upper and lower extremities present Neuro: on vent, not following commands     Lab Results:  Recent Labs    09/17/17 1145 09/18/17 0446  WBC 18.0* 17.6*  HGB 8.1* 8.6*  HCT 25.0* 27.1*  PLT 82* 55*   BMET Recent Labs    09/17/17 1746 09/18/17 0446  NA 138 137  K 4.9 4.4  CL 101 100*  CO2 25 28  GLUCOSE 209* 185*  BUN 25* 24*  CREATININE 0.51 0.39*  CALCIUM 9.6 9.3   PT/INR Recent Labs    09/17/17 0523 09/18/17 0446  LABPROT 21.3* 23.5*  INR 1.86 2.12   CMP     Component Value Date/Time   NA 137 09/18/2017 0446   NA 142 02/12/2017 1400   K 4.4 09/18/2017 0446   K 3.9 02/12/2017 1400   CL 100 (L) 09/18/2017 0446   CO2 28 09/18/2017 0446   CO2 30 (H) 02/12/2017 1400   GLUCOSE 185 (H) 09/18/2017 0446   GLUCOSE 88 02/12/2017 1400   BUN 24 (H) 09/18/2017 0446   BUN 16.9 02/12/2017 1400   CREATININE 0.39 (L) 09/18/2017 0446   CREATININE 6.2 (HH) 02/12/2017 1400   CALCIUM 9.3 09/18/2017 0446   CALCIUM 9.2 02/12/2017 1400   PROT 5.6 (L) 09/18/2017 0446   PROT 6.8 02/12/2017 1400   ALBUMIN 2.6 (L) 09/18/2017 0446   ALBUMIN 3.3 (L) 02/12/2017 1400   AST 131 (H) 09/18/2017 0446   AST 13 02/12/2017 1400   ALT 92 (H) 09/18/2017 0446   ALT 7 02/12/2017 1400   ALKPHOS 299 (H) 09/18/2017 0446   ALKPHOS 75 02/12/2017 1400   BILITOT 24.1 (HH) 09/18/2017 0446   BILITOT 0.68 02/12/2017 1400   GFRNONAA >60 09/18/2017 0446   GFRAA >60 09/18/2017 0446   Lipase     Component Value Date/Time   LIPASE 36 08/28/2017 1618       Studies/Results: Dg Chest Port 1 View  Result Date: 09/17/2017 CLINICAL DATA:  Tracheostomy tube. EXAM: PORTABLE CHEST 1 VIEW COMPARISON:  09/16/2017. FINDINGS: Tracheostomy tube, right IJ line, NG to in stable position. Ureteral stents appear to be in stable position. Left mid lung field and bibasilar subsegmental atelectasis and/or scarring. No pleural effusion or pneumothorax. Heart size normal. Degenerative change thoracic spine. Surgical  clips left chest. IMPRESSION: 1.  Lines and tubes in stable position. 2. Mild left mid lung field and bibasilar subsegmental atelectasis and/or scarring. Electronically Signed   By: Marcello Moores  Register   On: 09/17/2017 09:24    Anti-infectives: Anti-infectives (From admission, onward)   Start     Dose/Rate Route Frequency Ordered Stop   09/18/17 1100  anidulafungin (ERAXIS) 100 mg in sodium chloride 0.9 % 100 mL IVPB     100 mg 78 mL/hr over 100 Minutes Intravenous Every 24 hours 09/17/17 0858     09/17/17 1400  meropenem (MERREM) 1 g in sodium chloride 0.9 % 100 mL IVPB     1 g 200 mL/hr over 30 Minutes Intravenous Every 12 hours 09/17/17 1344     09/17/17 1100  anidulafungin (ERAXIS) 200 mg in  sodium chloride 0.9 % 200 mL IVPB     200 mg 78 mL/hr over 200 Minutes Intravenous  Once 09/17/17 0858 09/17/17 1329   09/08/17 2000  piperacillin-tazobactam (ZOSYN) IVPB 3.375 g  Status:  Discontinued     3.375 g 100 mL/hr over 30 Minutes Intravenous Every 6 hours 09/08/17 1946 09/16/17 1151   09/05/17 2000  piperacillin-tazobactam (ZOSYN) IVPB 3.375 g     3.375 g 12.5 mL/hr over 240 Minutes Intravenous Every 12 hours 09/05/17 1126 09/07/17 0154   08/31/17 0800  piperacillin-tazobactam (ZOSYN) IVPB 3.375 g  Status:  Discontinued     3.375 g 100 mL/hr over 30 Minutes Intravenous Every 6 hours 08/31/17 0739 09/05/17 1126   08/30/17 2000  Ampicillin-Sulbactam (UNASYN) 3 g in sodium chloride 0.9 % 100 mL IVPB  Status:  Discontinued     3 g 200 mL/hr over 30 Minutes Intravenous Every 24 hours 08/30/2017 0953 08/30/17 0945   08/30/17 1230  piperacillin-tazobactam (ZOSYN) IVPB 3.375 g  Status:  Discontinued     3.375 g 12.5 mL/hr over 240 Minutes Intravenous Every 12 hours 08/30/17 1134 08/31/17 0738   09/08/2017 1030  Ampicillin-Sulbactam (UNASYN) 3 g in sodium chloride 0.9 % 100 mL IVPB     3 g 200 mL/hr over 30 Minutes Intravenous  Once 09/07/2017 0953 09/13/2017 1246   08/23/2017 0000  ampicillin-sulbactam (UNASYN) 1.5 g in sodium chloride 0.9 % 100 mL IVPB  Status:  Discontinued     1.5 g 200 mL/hr over 30 Minutes Intravenous Once 08/28/17 1045 08/25/2017 0928   08/20/2017 2200  piperacillin-tazobactam (ZOSYN) IVPB 3.375 g    Note to Pharmacy:  Zosyn 3.375 g IV q12h in ESRD on HD   3.375 g 12.5 mL/hr over 240 Minutes Intravenous Every 12 hours 09/09/2017 1155 08/25/17 1335   08/22/17 1000  piperacillin-tazobactam (ZOSYN) IVPB 3.375 g  Status:  Discontinued    Note to Pharmacy:  Zosyn 3.375 g IV q12h in ESRD on HD   3.375 g 12.5 mL/hr over 240 Minutes Intravenous Every 12 hours 08/22/17 0128 09/01/2017 1155   08/22/17 0045  piperacillin-tazobactam (ZOSYN) IVPB 3.375 g     3.375 g 12.5 mL/hr over 240  Minutes Intravenous  Once 08/22/17 0038 08/22/17 0145       Assessment/Plan HTN ESRD - HD MWF; now on CVVHD H/o breast cancer  Septic shock  Acute cholecystitis s/p lap chole4/6/19 by Dr. Redmond Pulling - CT scan 4/8 w/ small amount fluid around liver and layering in pelvis ; HIDA 4/9 positive for bile leak - CT 4/21 with multiple fluid collections -additional drain placed 4/24; Cx negative -ERCP 4/23 w stent placement (Dr Fuller Plan); erosive gastritis noted -  continue PPI - continue drains  Shock liver Hyperbilirubinemia - drains stable, suspect 2/2 prolonged TNA/cholestasis Leukocytosis - 17,600 Severe protein-calorie malnutrition - TNA   VDRF secondary to aspiration/PNA-PNA resolved and CCMfollowing - appreciate assistance.  - s/p trach 4/28 on full vent support   NSTEMI 4/12 LUE DVT 4/17  ABL anemia- hgb/hct 8.6/27.1; s/p 1 unit PRBC and 1 unit platelets yesterday Thrombocytopenia - 55,000    ID -zosyn 4/4-4/7,Unasyn 4/11-4/12, Zosyn 4/12 -4/19, Zosyn 4/21-4/29, Eraxis 4/30 >>  FEN -NPO/NGT to LIWS VTE -SCDs Foley -none, anuric Follow up -Dr. Redmond Pulling  Dispo: worsening shock on 2 pressors and now with presumed UGI bleed. Repeat blood cultures pending. Continue NPO/TNA. Continue abdominal drains. No acute surgical intervention indicated    LOS: 27 days    Brigid Re , [facility] 09/18/2017, 8:10 AM Pager: [phone] Consults: [phone] Mon-Fri 7:00 am-4:30 pm Sat-Sun 7:00 am-11:30 am

## 2017-09-18 NOTE — Progress Notes (Signed)
Referring Physician(s): Dr. Kieth Brightly  Supervising Physician: Markus Daft  Patient Status:  [name] - In-pt  Chief Complaint: Bile leak s/p cholecystectomy 09/13/2017  Subjective: Weaning on trach/vent.  Not responsive.  Bloody output from NG.  Allergies: Erythromycin and Vicodin [hydrocodone-acetaminophen]  Medications: Prior to Admission medications   Medication Sig Start Date End Date Taking? Authorizing Provider  amLODipine (NORVASC) 10 MG tablet Take 10 mg by mouth every evening.  03/31/13  Yes [provider]  calcium acetate (PHOSLO) 667 MG capsule Take 2 capsules (1,334 mg total) by mouth 3 (three) times daily with meals. 09/15/15  Yes Dana Allan I, MD  cloNIDine (CATAPRES) 0.1 MG tablet Take 0.1 mg by mouth every evening.  03/24/13  Yes [provider]  fluticasone (FLONASE) 50 MCG/ACT nasal spray Place 2 sprays into both nostrils daily. 09/15/15  Yes Dana Allan I, MD  hydrALAZINE (APRESOLINE) 50 MG tablet Take 50 mg by mouth every evening.  04/14/13  Yes [provider]  multivitamin (RENA-VIT) TABS tablet Take 1 tablet by mouth at bedtime. 09/15/15  Yes Bonnell Public, MD  tamoxifen (NOLVADEX) 20 MG tablet Take 1 tablet (20 mg total) by mouth daily. Patient taking differently: Take 20 mg by mouth every evening.  02/12/17  Yes Causey, Charlestine Massed, NP  hydrOXYzine (ATARAX/VISTARIL) 25 MG tablet Take 1 tablet (25 mg total) by mouth every 8 (eight) hours as needed for itching. Patient not taking: Reported on 08/22/2017 09/15/15   Dana Allan I, MD     Vital Signs: BP 136/60   Pulse 89   Temp (!) 97.2 F (36.2 C) (Axillary)   Resp 18   Ht 5\' 2"  (1.575 m)   Wt 130 lb 4.7 oz (59.1 kg)   SpO2 100%   BMI 23.83 kg/m   Physical Exam  Constitutional: She appears well-developed.  Nursing note and vitals reviewed. Abdomen:  Lateral biloma drain in place.  Insertion site c/d/i.  Still with minimal output.  Midline biloma drain  in place.  Insertion site c/d/i.  Output is decreasing.  75 mL recorded overnight.     Imaging: Dg Chest Port 1 View  Result Date: 09/18/2017 CLINICAL DATA:  Tracheostomy patient, dialysis dependent renal failure, septic shock EXAM: PORTABLE CHEST 1 VIEW COMPARISON:  Portable chest x-ray of September 17, 2017 FINDINGS: The lungs remain borderline hypoinflated. The interstitial markings are coarse but stable. There is no alveolar infiltrate. The heart and pulmonary vascularity are normal. There is calcification in the wall of the aortic arch. The tracheostomy tube tip projects at the inferior margin of the clavicular heads. The right internal jugular venous catheter tip projects over the midportion of the SVC. The esophagogastric tube tip in proximal port project in below the GE junction. There a pigtail catheters bilaterally in the abdomen which are stable. IMPRESSION: Stable appearance of the chest.  No pneumonia nor CHF. Thoracic aortic atherosclerosis. Electronically Signed   By: David  Martinique M.D.   On: 09/18/2017 09:42   Dg Chest Port 1 View  Result Date: 09/17/2017 CLINICAL DATA:  Tracheostomy tube. EXAM: PORTABLE CHEST 1 VIEW COMPARISON:  09/16/2017. FINDINGS: Tracheostomy tube, right IJ line, NG to in stable position. Ureteral stents appear to be in stable position. Left mid lung field and bibasilar subsegmental atelectasis and/or scarring. No pleural effusion or pneumothorax. Heart size normal. Degenerative change thoracic spine. Surgical clips left chest. IMPRESSION: 1.  Lines and tubes in stable position. 2. Mild left mid lung field and bibasilar subsegmental atelectasis  and/or scarring. Electronically Signed   By: Marcello Moores  Register   On: 09/17/2017 09:24   Dg Chest Port 1 View  Result Date: 09/16/2017 CLINICAL DATA:  Tracheostomy. EXAM: PORTABLE CHEST 1 VIEW COMPARISON:  Radiograph of September 15, 2017. FINDINGS: The heart size and mediastinal contours are within normal limits. Atherosclerosis of  thoracic aorta is noted. Tracheostomy tube is in grossly good position. Right internal jugular catheter is again noted with tip in expected position of the SVC. Nasogastric tube is seen entering the stomach. No pneumothorax is noted. Minimal bibasilar subsegmental atelectasis is noted. The visualized skeletal structures are unremarkable. IMPRESSION: Tracheostomy tube in good position. Stable minimal bibasilar subsegmental atelectasis. Aortic Atherosclerosis (ICD10-I70.0). Electronically Signed   By: Marijo Conception, M.D.   On: 09/16/2017 07:32   Dg Chest Port 1 View  Result Date: 09/15/2017 CLINICAL DATA:  Tracheostomy status EXAM: PORTABLE CHEST 1 VIEW COMPARISON:  09/15/2017 600 hours FINDINGS: The endotracheal tube has been exchanged for a tracheostomy tube. The tip is 5.1 cm from the carina. Low lung volumes. Normal heart size. Bibasilar atelectasis increased. Bilateral nephroureteral catheter is in place. Stable right jugular central venous catheter. NG tube removed. IMPRESSION: Tracheostomy tube is in place. Bibasilar atelectasis increased Electronically Signed   By: Marybelle Killings M.D.   On: 09/15/2017 12:58   Dg Chest Port 1 View  Result Date: 09/15/2017 CLINICAL DATA:  Endotracheal tube present.  Respiratory failure. EXAM: PORTABLE CHEST 1 VIEW COMPARISON:  09/14/2017 FINDINGS: Endotracheal tube tip has been advanced, now 4.1 cm above the carina. Right jugular central venous catheter and nasogastric tube remain in appropriate position. Heart size is within normal limits. Aortic atherosclerosis. Stable subsegmental atelectasis in right lung base. No evidence of pulmonary consolidation or pleural effusion. IMPRESSION: Endotracheal tube in appropriate position. Stable mild right basilar atelectasis. Electronically Signed   By: Earle Gell M.D.   On: 09/15/2017 09:53   Dg Abd Portable 1v  Result Date: 09/15/2017 CLINICAL DATA:  Feeding tube placement. EXAM: PORTABLE ABDOMEN - 1 VIEW COMPARISON:  CT,  09/08/2017. FINDINGS: Nasogastric tube passes well below the diaphragm. Tip projects in the distal stomach or first portion of the duodenum. Percutaneous pigtail catheters are noted, 1 projecting in the epigastric region the other in the right upper quadrant along the inferior margin of the liver. Biliary stent is noted. IMPRESSION: Well-positioned nasogastric tube. Electronically Signed   By: Lajean Manes M.D.   On: 09/15/2017 18:11    Labs:  CBC: Recent Labs    09/16/17 0510 09/17/17 0523 09/17/17 1145 09/18/17 0446  WBC 16.5* 17.3* 18.0* 17.6*  HGB 10.1* 7.0* 8.1* 8.6*  HCT 31.3* 21.9* 25.0* 27.1*  PLT 33* 31* 82* 55*    COAGS: Recent Labs    09/05/17 2004 09/06/17 0011 09/06/17 0515 09/09/17 0400  09/15/17 0321 09/16/17 0510 09/17/17 0523 09/18/17 0446  INR  --   --   --   --    < > 1.82 1.98 1.86 2.12  APTT 82* 82* 77* 53*  --   --   --   --   --    < > = values in this interval not displayed.    BMP: Recent Labs    09/16/17 1835 09/17/17 0523 09/17/17 1746 09/18/17 0446  NA 135 137 138 137  K 4.7 5.1 4.9 4.4  CL 100* 101 101 100*  CO2 25 25 25 28   GLUCOSE 173* 168* 209* 185*  BUN 28* 24* 25* 24*  CALCIUM 9.2  9.4 9.6 9.3  CREATININE 0.68 0.56 0.51 0.39*  GFRNONAA >60 >60 >60 >60  GFRAA >60 >60 >60 >60    LIVER FUNCTION TESTS: Recent Labs    09/14/17 0428  09/15/17 1534  09/16/17 0510 09/16/17 1835 09/17/17 0523 09/17/17 1746 09/18/17 0446  BILITOT 16.8*  --  20.8*  --  19.3*  --  25.0*  --  24.1*  AST 216*  --   --   --  142*  --  165*  --  131*  ALT 172*  --   --   --  120*  --  102*  --  92*  ALKPHOS 455*  --   --   --  432*  --  302*  --  299*  PROT 6.4*  --   --   --  6.0*  --  5.7*  --  5.6*  ALBUMIN 2.1*   < >  --    < > 1.6* 3.4* 2.7* 3.1* 2.6*   < > = values in this interval not displayed.    Assessment and Plan: Cholecystectomy 09/04/2017, susdequent bile leak S/p biloma drain placement 4/11 and again on 4/24 Patient remains  intubated.  Now s/p trach placement.  Tbili now at 24.1K.   Lateral biloma drain with minimal output.  Midline drain with 50-75 mL daily output over the past few days.  Continue drains for now.  IR following.  Electronically Signed: Docia Barrier, PA 09/18/2017, 11:58 AM   I spent a total of 15 Minutes at the the patient's bedside AND on the patient's hospital floor or unit, greater than 50% of which was counseling/coordinating care for biloma.

## 2017-09-18 NOTE — Progress Notes (Signed)
RT note-Patient placed back to full support, increased RR. Echo being performed.

## 2017-09-18 DEATH — deceased

## 2017-09-19 DIAGNOSIS — E440 Moderate protein-calorie malnutrition: Secondary | ICD-10-CM

## 2017-09-19 LAB — GLUCOSE, CAPILLARY
GLUCOSE-CAPILLARY: 165 mg/dL — AB (ref 65–99)
Glucose-Capillary: 153 mg/dL — ABNORMAL HIGH (ref 65–99)
Glucose-Capillary: 151 mg/dL — ABNORMAL HIGH (ref 65–99)
Glucose-Capillary: 148 mg/dL — ABNORMAL HIGH (ref 65–99)
Glucose-Capillary: 199 mg/dL — ABNORMAL HIGH (ref 65–99)

## 2017-09-19 LAB — PROCALCITONIN: Procalcitonin: 3.64 ng/mL

## 2017-09-19 LAB — COMPREHENSIVE METABOLIC PANEL
ALBUMIN: 2.4 g/dL — AB (ref 3.5–5.0)
ALK PHOS: 345 U/L — AB (ref 38–126)
ALT: 128 U/L — ABNORMAL HIGH (ref 14–54)
AST: 164 U/L — AB (ref 15–41)
Anion gap: 6 (ref 5–15)
BILIRUBIN TOTAL: 24.2 mg/dL — AB (ref 0.3–1.2)
BUN: 36 mg/dL — AB (ref 6–20)
CO2: 26 mmol/L (ref 22–32)
Calcium: 9.2 mg/dL (ref 8.9–10.3)
Chloride: 104 mmol/L (ref 101–111)
Creatinine, Ser: 0.49 mg/dL (ref 0.44–1.00)
GFR calc Af Amer: >60 mL/min (ref >60)
GFR calc non Af Amer: >60 mL/min (ref >60)
GLUCOSE: 174 mg/dL — AB (ref 65–99)
POTASSIUM: 4.3 mmol/L (ref 3.5–5.1)
Sodium: 136 mmol/L (ref 135–145)
TOTAL PROTEIN: 6 g/dL — AB (ref 6.5–8.1)

## 2017-09-19 LAB — CBC WITH DIFFERENTIAL/PLATELET
BASOS PCT: 0 %
BLASTS: 0 %
Band Neutrophils: 0 %
Basophils Absolute: 0 10*3/uL (ref 0.0–0.1)
Eosinophils Absolute: 0 10*3/uL (ref 0.0–0.7)
Eosinophils Relative: 0 %
HEMATOCRIT: 29.7 % — AB (ref 36.0–46.0)
HEMOGLOBIN: 9.3 g/dL — AB (ref 12.0–15.0)
LYMPHS PCT: 4 %
Lymphs Abs: 0.6 10*3/uL — ABNORMAL LOW (ref 0.7–4.0)
MCH: 31.2 pg (ref 26.0–34.0)
MCHC: 31.3 g/dL (ref 30.0–36.0)
MCV: 99.7 fL (ref 78.0–100.0)
MONO ABS: 0.5 10*3/uL (ref 0.1–1.0)
Metamyelocytes Relative: 0 %
Monocytes Relative: 3 %
Myelocytes: 0 %
NEUTROS PCT: 93 %
NRBC: 21 /100{WBCs} — AB
Neutro Abs: 14.4 10*3/uL — ABNORMAL HIGH (ref 1.7–7.7)
OTHER: 0 %
PROMYELOCYTES RELATIVE: 0 %
Platelets: 40 10*3/uL — ABNORMAL LOW (ref 150–400)
RBC: 2.98 MIL/uL — AB (ref 3.87–5.11)
RDW: 25.6 % — ABNORMAL HIGH (ref 11.5–15.5)
WBC: 15.5 10*3/uL — ABNORMAL HIGH (ref 4.0–10.5)

## 2017-09-19 LAB — BLOOD CULTURE ID PANEL (REFLEXED)
Acinetobacter baumannii: NOT DETECTED
CANDIDA KRUSEI: NOT DETECTED
CANDIDA PARAPSILOSIS: NOT DETECTED
Candida albicans: NOT DETECTED
Candida glabrata: NOT DETECTED
Candida tropicalis: NOT DETECTED
ENTEROCOCCUS SPECIES: NOT DETECTED
ESCHERICHIA COLI: NOT DETECTED
Enterobacter cloacae complex: NOT DETECTED
Enterobacteriaceae species: NOT DETECTED
Haemophilus influenzae: NOT DETECTED
Klebsiella oxytoca: NOT DETECTED
Klebsiella pneumoniae: NOT DETECTED
LISTERIA MONOCYTOGENES: NOT DETECTED
Neisseria meningitidis: NOT DETECTED
PROTEUS SPECIES: NOT DETECTED
Pseudomonas aeruginosa: NOT DETECTED
SERRATIA MARCESCENS: NOT DETECTED
STAPHYLOCOCCUS AUREUS BCID: NOT DETECTED
STREPTOCOCCUS PYOGENES: NOT DETECTED
Staphylococcus species: NOT DETECTED
Streptococcus agalactiae: NOT DETECTED
Streptococcus pneumoniae: NOT DETECTED
Streptococcus species: NOT DETECTED

## 2017-09-19 LAB — PROTIME-INR
INR: 1.9
Prothrombin Time: 21.6 seconds — ABNORMAL HIGH (ref 11.4–15.2)

## 2017-09-19 LAB — RENAL FUNCTION PANEL
Albumin: 2 g/dL — ABNORMAL LOW (ref 3.5–5.0)
Anion gap: 8 (ref 5–15)
BUN: 36 mg/dL — AB (ref 6–20)
CHLORIDE: 107 mmol/L (ref 101–111)
CO2: 22 mmol/L (ref 22–32)
CREATININE: 0.63 mg/dL (ref 0.44–1.00)
Calcium: 8.1 mg/dL — ABNORMAL LOW (ref 8.9–10.3)
GFR calc Af Amer: >60 mL/min (ref >60)
GFR calc non Af Amer: >60 mL/min (ref >60)
GLUCOSE: 197 mg/dL — AB (ref 65–99)
POTASSIUM: 3.8 mmol/L (ref 3.5–5.1)
Phosphorus: 3.4 mg/dL (ref 2.5–4.6)
Sodium: 137 mmol/L (ref 135–145)

## 2017-09-19 LAB — PHOSPHORUS: PHOSPHORUS: 3.3 mg/dL (ref 2.5–4.6)

## 2017-09-19 LAB — MAGNESIUM: Magnesium: 2.4 mg/dL (ref 1.7–2.4)

## 2017-09-19 MED ORDER — ZINC TRACE METAL 1 MG/ML IV SOLN
INTRAVENOUS | Status: AC
  Administered 2017-09-19: 17:00:00 via INTRAVENOUS
  Filled 2017-09-19: qty 760

## 2017-09-19 NOTE — Progress Notes (Addendum)
[facility] Progress Note   Chief Complaint:   Bile duct leak, anemia.     SUBJECTIVE:    eyes open, doesn't respond to me   ASSESSMENT AND PLAN:   59. 84 yo female with prolonged hospital course following post-chole bile leak. She is s/p perc drain placement and ERCP with biliary stent placement to bile duck on 08/25/2017. CT scan yesterday shows decreased fluid collection around left liver with drain present but the collection posterior to right liver where this is another drain, hasn't really changed   2. Anemia requiring multiple transfusions. Hgb fell 3 grams from 10 to 7 two days ago for unclear reasons. Some bloody drainage in biliary drain and NGT but decline in hgb out of proportion to blood loss. She had bloody stools but that was several days ago. CT scan yesterday negative for intraabdominal bleed.    -hgb up 2 grams and stable since last blood transfusion on 4/30.  -continue PPI for erosive gastritis found on endoscopy. Holding on doing EGD but if hgb drops again / overt bleeding we may need to proceed. Spoke with RN. There has been minimal output in NGT overnight.   3. Probable cirrhosis based on CT scan. Bili continues to climb which is probably multifactorial (ischemic, acute illness, underlying cirrhosis)  3. Hypotension / shock. CT scan suggests splenic infarcts and probable ischemic colitis which is likely from hypotension.   4. VDRF, s/p trach  5. AKI / CKD, on CVVHD  Agree with Ms. Guenther's assessment and plan.  I do not think she is having sig GI bleeding now and suspect had an ischemic insult the other day that contributed to bleeding and Hgb drop.  I do not have any new care recommendations now and will sign off but am available if needed.  Gatha Mayer, MD, [facility]    OBJECTIVE:      Vital signs in last 24 hours: Temp:  [97.3 F (36.3 C)-97.8 F (36.6 C)] 97.5 F (36.4 C) (05/02 0800) Pulse Rate:  [72-99] 87 (05/02  1115) Resp:  [12-32] 19 (05/02 1115) BP: (92-159)/(36-74) 118/70 (05/02 1115) SpO2:  [100 %] 100 % (05/02 1115) FiO2 (%):  [30 %] 30 % (05/02 1112) Weight:  [128 lb 12 oz (58.4 kg)] 128 lb 12 oz (58.4 kg) (05/02 0440) Last BM Date: 09/16/17 General:   Alert, female starting at wall in NAD  Heart:  Regular rate and rhythm; + murmur, LUE edematous ,no lower extremity edema Pulm: Normal respiratory effort Abdomen:  Soft, mildly distended, nontender it seems.  Normal bowel sounds, Two perc drains in place with small amounts of bilious secretions  Neurologic:  Alert Psych: unable to assess   Intake/Output from previous day: 05/01 0701 - 05/02 0700 In: 2087.6 [I.V.:1827.6; NG/GT:60; IV Piggyback:200] Out: 1092 [Emesis/NG output:135; Drains:105] Intake/Output this shift: Total I/O In: 390.4 [I.V.:280.4; Other:10; IV Piggyback:100] Out: 395 [Emesis/NG output:50; Drains:10; Other:335]  Lab Results: Recent Labs    09/17/17 1145 09/18/17 0446 09/19/17 0355  WBC 18.0* 17.6* 15.5*  HGB 8.1* 8.6* 9.3*  HCT 25.0* 27.1* 29.7*  PLT 82* 55* 40*   BMET Recent Labs    09/18/17 0446 09/18/17 1532 09/19/17 0355  NA 137 137 136  K 4.4 4.3 4.3  CL 100* 104 104  CO2 28 25 26   GLUCOSE 185* 204* 174*  BUN 24* 33* 36*  CREATININE 0.39* 0.55 0.49  CALCIUM 9.3 9.1 9.2   LFT Recent Labs    09/19/17  0355  PROT 6.0*  ALBUMIN 2.4*  AST 164*  ALT 128*  ALKPHOS 345*  BILITOT 24.2*   PT/INR Recent Labs    09/18/17 0446 09/19/17 0355  LABPROT 23.5* 21.6*  INR 2.12 1.90      LOS: 28 days   Tye Savoy ,NP 09/19/2017, 11:27 AM

## 2017-09-19 NOTE — Progress Notes (Signed)
[facility] Progress Note  Subjective: on pressors , dosing is down some  Vitals:   09/19/17 1415 09/19/17 1430 09/19/17 1445 09/19/17 1500  BP: (!) 106/50  (!) 108/55 (!) 102/47  Pulse:  93    Resp: 16 (!) _0 Temp:      TempSrc:      SpO2:  94%    Weight:      Height:        Inpatient medications: . chlorhexidine gluconate (MEDLINE KIT)  15 mL Mouth Rinse BID  . Chlorhexidine Gluconate Cloth  6 each Topical Daily  . darbepoetin (ARANESP) injection - DIALYSIS  150 mcg Subcutaneous Q Mon-1800  . doxercalciferol  2 mcg Intravenous Q M,W,F-1800  . hydrocortisone sod succinate (SOLU-CORTEF) inj  50 mg Intravenous Q6H  . insulin aspart  0-15 Units Subcutaneous Q4H  . mouth rinse  15 mL Mouth Rinse QID  . pantoprazole (PROTONIX) IV  40 mg Intravenous Q12H  . sodium chloride flush  10-40 mL Intracatheter Q12H   . sodium chloride 10 mL/hr at 09/19/17 0700  . fentaNYL infusion INTRAVENOUS 50 mcg/hr (09/19/17 1400)  . norepinephrine (LEVOPHED) Adult infusion Stopped (09/19/17 1200)  . dialysis replacement fluid (prismasate) 400 mL/hr at 09/19/17 0174  . dialysis replacement fluid (prismasate) 200 mL/hr at 09/18/17 1032  . dialysate (PRISMASATE) 1,300 mL/hr at 09/19/17 1400  . sodium chloride 999 mL/hr at 09/15/17 0119  . TPN ADULT (ION) 50 mL/hr at 09/19/17 0700  . TPN ADULT (ION)    . vasopressin (PITRESSIN) infusion - *FOR SHOCK* Stopped (09/17/17 1500)   albuterol, alteplase, docusate, fentaNYL (SUBLIMAZE) injection, heparin, midazolam, sodium chloride  Exam: On vent, not responsive trach in place No jvd Chest coarse bilat RRR no mrg abd soft ntnd  abd drains in place Ext 1-2+ edema upper extremities nf not following commands eyes open lua avg w bruit   D[facility] 3h 54mn  61.5kg   2/2.25 bath p4  AVG  Heparin 4000 - hect 2 ug q hd iv - mircera 30 ug every 2 wks iv at dialysis       Impression: 1 cholecystitis: sp lap chole w bile  leak 2 jaundice w suspected cirrhosis and liver failure 3 biloma/intraperitoneal abscesses - drains placed in abscesses/biloma (intrahepatic) 4 septic shock - better , pressors off today 5 esrd - usual hd mwf , cont crrt uf as tolerated 6 vdrf - sp trach 7 anemia ckd/ abl - on darbe 150 ug weekly, hb 9- 10 8 gi bleed gastritis seen at [facility], sp prbc's x8 and ffp x 16 since admission   Plan - as above   RKelly SplinterMD [facility] pager [phone]  09/19/2017, 3:10 PM   Recent Labs  Lab 09/18/17 0446 09/18/17 1532 09/19/17 0355  NA 137 137 136  K 4.4 4.3 4.3  CL 100* 104 104  CO2 _1 GLUCOSE 185* 204* 174*  BUN 24* 33* 36*  CREATININE 0.39* 0.55 0.49  CALCIUM 9.3 9.1 9.2  PHOS 3.0 3.4 3.3   Recent Labs  Lab 09/17/17 0523  09/18/17 0446 09/18/17 1532 09/19/17 0355  AST 165*  --  131*  --  164*  ALT 102*  --  92*  --  128*  ALKPHOS 302*  --  299*  --  345*  BILITOT 25.0*  --  24.1*  --  24.2*  PROT 5.7*  --  5.6*  --  6.0*  ALBUMIN 2.7*   < >  2.6* 2.5* 2.4*   < > = values in this interval not displayed.   Recent Labs  Lab 09/16/17 0510  09/17/17 1145 09/18/17 0446 09/19/17 0355  WBC 16.5*   < > 18.0* 17.6* 15.5*  NEUTROABS 14.4*  --   --   --  14.4*  HGB 10.1*   < > 8.1* 8.6* 9.3*  HCT 31.3*   < > 25.0* 27.1* 29.7*  MCV 97.2   < > 96.5 97.5 99.7  PLT 33*   < > 82* 55* 40*   < > = values in this interval not displayed.   Iron/TIBC/Ferritin/ %Sat    Component Value Date/Time   IRON 84 04/15/2010 1030   TIBC 178 (L) 04/15/2010 1030   FERRITIN 784 (H) 04/15/2010 1030   IRONPCTSAT 47 04/15/2010 1030

## 2017-09-19 NOTE — Progress Notes (Signed)
PHARMACY - ADULT TOTAL PARENTERAL NUTRITION CONSULT NOTE   Pharmacy Consult:  TPN Indication: Intolerance to enteral nutrition  Patient Measurements: Height: 5\' 2"  (157.5 cm) Weight: 128 lb 12 oz (58.4 kg) IBW/kg (Calculated) : 50.1 TPN AdjBW (KG): 61.7 Body mass index is 23.55 kg/m.  Assessment:  74 YOF presented on 09/03/2017 with abdominal pain and vomiting, found to have cholecystitis and she underwent cholecystomy on 08/23/2017.  She was on a clear liquid diet before and after surgery, but did not tolerate.  Imaging showed bile leak with fluid collection around the liver, and patient aspirated prior to planned ERCP on.  She was started on TF on 09/05/17 but did not tolerate and retried on 09/06/17 without success.  Pharmacy consulted to manage TPN for nutritional support.  GI: didn't tolerate TF again on 4/26, new UGIB 4/30 on PPI IV BID.  Prealbumin improved to 5, NG O/P 160mL, biloma O/P 82mL, LBM 4/29. Endo: hypoglycemic prior to TPN, CBGs improving with increased SSI Insulin requirements in the past 24 hours: 19 units SSI Lytes: all WNL (CoCa and mag high normal) Renal: ESRD on MWF HD, CRRT restarted 4/21, BUN 36, net 7.4L - Aranesp, Hectorol Pulm: s/p trach 4/28 - FiO2 30%, HC started 4/30 Cards: HTN, s/p NSTEMI.  MAP 50-60s on Levophed, HR normal AC: s/p heparin for LUE DVT, bloody NG O/P - hgb 9.3, plts improved to 55, INR 1.9 s/p Vit K Hepatobil: shock liver s/p ERCP and CBD stent 4/23 - LFTs starting to trend back up, TG 105 >> 171, tbili 24.2 (has been providing 0.45 g/kg/day ILE) Neuro: Fentanyl gtt - GCS 11, CPOT 0, RASS -1 ID: Merrem/Eraxis for IAI, s/p Zosyn for PNA/bile leak - afebrile, WBC down 15.5, PCT down 3.64 TPN Access: R IJ placed 09/14/2017 TPN start date: 09/08/17  Nutritional Goals (per RD rec on 4/30): 1200-1300 kCal and 110-125 gm protein per day  Current Nutrition:  TPN   Plan:  Continue TPN at 50 ml/hr, tweaking to provide 114g AA, 168g CHO, 25g ILE for a  total of 1279 kCal, meeting 100% of patient needs Electrolytes in TPN: no change today (Cl:Ac 1:1, minimal calcium and mag in TPN) Daily multivitamin in TPN Remove multi-trace d/t liver injury - add zinc 5mg , chromium 81mcg, selenium 59mcg Continue resistant SSI Q4H while on steroid (was on sensitive Q6H with controlled CBGs) F/U daily   Aksel Bencomo D. Mina Marble, PharmD, BCPS, BCCCP Pager:  [phone] 09/19/2017, 10:15 AM

## 2017-09-19 NOTE — Progress Notes (Signed)
[facility] Progress Note  9 Days Post-Op  Subjective: CC: sepsis with MSOF Requiring less levophed. On wake up assessment, opens eyes but not tracking or following commands No BM overnight, bloody drainage in NGT. Bloody/bilious drainage in IR drains. RLQ with 0 cc in 24 h, midline drain with 105 cc in 24h. Family not present at bedside, palliative care consulted yesterday.   Objective: Vital signs in last 24 hours: Temp:  [97.3 F (36.3 C)-97.8 F (36.6 C)] 97.8 F (36.6 C) (05/02 0400) Pulse Rate:  [72-99] 80 (05/02 0730) Resp:  [12-28] 19 (05/02 0730) BP: (95-159)/(36-74) 119/54 (05/02 0730) SpO2:  [99 %-100 %] 100 % (05/02 0730) FiO2 (%):  [30 %] 30 % (05/02 0727) Weight:  [58.4 kg (128 lb 12 oz)] 58.4 kg (128 lb 12 oz) (05/02 0440) Last BM Date: 09/16/17  Intake/Output from previous day: 05/01 0701 - 05/02 0700 In: 2087.6 [I.V.:1827.6; NG/GT:60; IV Piggyback:200] Out: 1092 [Emesis/NG output:135; Drains:105] Intake/Output this shift: No intake/output data recorded.  PE: GEX:BMWUXLK, ill-appearing HEENT: trach in place on full vent support, NG tube in place - bloody drainage in cannister. 295 cc/24h Card: Regular rate and rhythm, no murmurs Pulm:CTAB, ventilated respirations Abd: Soft, hypoactive BS, drains c/d/i 105cc bilious and sanguinous drainage Skin: warm and dry, no rashes MSK: edema of bilateral upper and lower extremities present Neuro: on vent, not following commands     Lab Results:  Recent Labs    09/18/17 0446 09/19/17 0355  WBC 17.6* 15.5*  HGB 8.6* 9.3*  HCT 27.1* 29.7*  PLT 55* 40*   BMET Recent Labs    09/18/17 1532 09/19/17 0355  NA 137 136  K 4.3 4.3  CL 104 104  CO2 25 26  GLUCOSE 204* 174*  BUN 33* 36*  CREATININE 0.55 0.49  CALCIUM 9.1 9.2   PT/INR Recent Labs    09/18/17 0446 09/19/17 0355  LABPROT 23.5* 21.6*  INR 2.12 1.90   CMP     Component Value Date/Time   NA 136 09/19/2017 0355   NA 142 02/12/2017 1400   K 4.3 09/19/2017 0355   K 3.9 02/12/2017 1400   CL 104 09/19/2017 0355   CO2 26 09/19/2017 0355   CO2 30 (H) 02/12/2017 1400   GLUCOSE 174 (H) 09/19/2017 0355   GLUCOSE 88 02/12/2017 1400   BUN 36 (H) 09/19/2017 0355   BUN 16.9 02/12/2017 1400   CREATININE 0.49 09/19/2017 0355   CREATININE 6.2 (HH) 02/12/2017 1400   CALCIUM 9.2 09/19/2017 0355   CALCIUM 9.2 02/12/2017 1400   PROT 6.0 (L) 09/19/2017 0355   PROT 6.8 02/12/2017 1400   ALBUMIN 2.4 (L) 09/19/2017 0355   ALBUMIN 3.3 (L) 02/12/2017 1400   AST 164 (H) 09/19/2017 0355   AST 13 02/12/2017 1400   ALT 128 (H) 09/19/2017 0355   ALT 7 02/12/2017 1400   ALKPHOS 345 (H) 09/19/2017 0355   ALKPHOS 75 02/12/2017 1400   BILITOT 24.2 (HH) 09/19/2017 0355   BILITOT 0.68 02/12/2017 1400   GFRNONAA >60 09/19/2017 0355   GFRAA >60 09/19/2017 0355   Lipase     Component Value Date/Time   LIPASE 36 09/14/2017 1618       Studies/Results: Ct Abdomen Pelvis W Contrast  Result Date: 09/18/2017 CLINICAL DATA:  Abdominal distention.  Infection. EXAM: CT ABDOMEN AND PELVIS WITH CONTRAST TECHNIQUE: Multidetector CT imaging of the abdomen and pelvis was performed using the standard protocol following bolus administration of intravenous contrast. CONTRAST:  194mL OMNIPAQUE  IOHEXOL 300 MG/ML  SOLN COMPARISON:  09/08/2017 FINDINGS: Lower chest: Patchy airspace disease in the lung bases is similar to prior. Hepatobiliary: Interval evolution of the large subcapsular fluid collection around the liver. A dominant component along the lateral segment left liver has decreased in the interval with a percutaneous pigtail catheter coiled in the region on today's study, new in the interval. Mild periportal edema without gross intra hepatic biliary duct dilatation. A common bile duct stent is identified with mild distention of the common duct up to about 7 mm. Pancreas: No focal mass lesion. No dilatation of the main duct. No  intraparenchymal cyst. No peripancreatic edema. Spleen: Interval development of segmental areas of subcapsular hypo perfusion in the spleen, mainly anteriorly and inferiorly suggesting infarcts. Small perisplenic fluid collection persists. Adrenals/Urinary Tract: No adrenal nodule or mass. Kidneys are atrophic bilaterally with small bilateral hypoattenuating renal lesions, likely cysts. No hydroureteronephrosis. Bladder is decompressed. Stomach/Bowel: NG tube is identified in the stomach with the tip in the antral region. Stomach is decompressed. Duodenum is normally positioned as is the ligament of Treitz. No small bowel wall thickening. No small bowel dilatation. The terminal ileum is normal. The appendix is not visualized, but there is no edema or inflammation in the region of the cecum. Diverticular changes are seen diffusely along the colon with circumferential low-attenuation wall thickening in the splenic flexure (see image 32/series 3 and image 40/series 3). Colon is diffusely decompressed. Vascular/Lymphatic: There is abdominal aortic atherosclerosis without aneurysm. Portal vein and superior mesenteric vein are patent. There is no gastrohepatic or hepatoduodenal ligament lymphadenopathy. No intraperitoneal or retroperitoneal lymphadenopathy. No pelvic sidewall lymphadenopathy. Reproductive: There is no adnexal mass. Other: Small volume fluid identified in the para colic gutters bilaterally. Anterior peritoneal collection measured previously at 13.4 x 5.6 cm is not substantially changed, measuring 12.4 x 5.0 cm today. Free fluid is identified in the cul-de-sac were there is subtle associated peritoneal enhancement. As above, right paramidline sure cutaneous pigtail catheter has the distal tip coiled adjacent to the lateral segment of the left liver, just anterior to the stomach. Fluid collection in this region is decreased in the interval. A second percutaneous catheter on the right with the tip coiled  posterior to the right lobe of the liver is in stable position with no substantial change in the adjacent fluid collection. Small volume of gas in this fluid collection is new in the interval in compatible with the presence of the drain. Musculoskeletal: Bone windows reveal no worrisome lytic or sclerotic osseous lesions. Superior endplate compression deformity at L1 and L2 is similar to prior. IMPRESSION: 1. Circumferential wall thickening noted in the splenic flexure. Given watershed distribution and the reported clinical history of hypotension, ischemia is a consideration. 2. Interval evolution of the large perihepatic fluid collections. The fluid around the lateral segment left liver has decreased with interval percutaneous drain placement. Collection posterior to the right liver is not substantially changed in the interval with stable position of the drainage catheter. 3. Interval development of subcapsular areas of segmental hypoperfusion in the spleen, suggesting infarcts. Similar volume of perisplenic fluid evident. 4. Dominant apparently loculated fluid collection in the anterior abdomen measures just very minimally smaller in the interval. Electronically Signed   By: Misty Stanley M.D.   On: 09/18/2017 12:48   Dg Chest Port 1 View  Result Date: 09/18/2017 CLINICAL DATA:  Tracheostomy patient, dialysis dependent renal failure, septic shock EXAM: PORTABLE CHEST 1 VIEW COMPARISON:  Portable chest x-ray of September 17, 2017 FINDINGS: The lungs remain borderline hypoinflated. The interstitial markings are coarse but stable. There is no alveolar infiltrate. The heart and pulmonary vascularity are normal. There is calcification in the wall of the aortic arch. The tracheostomy tube tip projects at the inferior margin of the clavicular heads. The right internal jugular venous catheter tip projects over the midportion of the SVC. The esophagogastric tube tip in proximal port project in below the GE junction. There a  pigtail catheters bilaterally in the abdomen which are stable. IMPRESSION: Stable appearance of the chest.  No pneumonia nor CHF. Thoracic aortic atherosclerosis. Electronically Signed   By: David  Martinique M.D.   On: 09/18/2017 09:42    Anti-infectives: Anti-infectives (From admission, onward)   Start     Dose/Rate Route Frequency Ordered Stop   09/18/17 1100  anidulafungin (ERAXIS) 100 mg in sodium chloride 0.9 % 100 mL IVPB     100 mg 78 mL/hr over 100 Minutes Intravenous Every 24 hours 09/17/17 0858     09/17/17 1400  meropenem (MERREM) 1 g in sodium chloride 0.9 % 100 mL IVPB     1 g 200 mL/hr over 30 Minutes Intravenous Every 12 hours 09/17/17 1344     09/17/17 1100  anidulafungin (ERAXIS) 200 mg in sodium chloride 0.9 % 200 mL IVPB     200 mg 78 mL/hr over 200 Minutes Intravenous  Once 09/17/17 0858 09/17/17 1329   09/08/17 2000  piperacillin-tazobactam (ZOSYN) IVPB 3.375 g  Status:  Discontinued     3.375 g 100 mL/hr over 30 Minutes Intravenous Every 6 hours 09/08/17 1946 09/16/17 1151   09/05/17 2000  piperacillin-tazobactam (ZOSYN) IVPB 3.375 g     3.375 g 12.5 mL/hr over 240 Minutes Intravenous Every 12 hours 09/05/17 1126 09/07/17 0154   08/31/17 0800  piperacillin-tazobactam (ZOSYN) IVPB 3.375 g  Status:  Discontinued     3.375 g 100 mL/hr over 30 Minutes Intravenous Every 6 hours 08/31/17 0739 09/05/17 1126   08/30/17 2000  Ampicillin-Sulbactam (UNASYN) 3 g in sodium chloride 0.9 % 100 mL IVPB  Status:  Discontinued     3 g 200 mL/hr over 30 Minutes Intravenous Every 24 hours 08/22/2017 0953 08/30/17 0945   08/30/17 1230  piperacillin-tazobactam (ZOSYN) IVPB 3.375 g  Status:  Discontinued     3.375 g 12.5 mL/hr over 240 Minutes Intravenous Every 12 hours 08/30/17 1134 08/31/17 0738   09/15/2017 1030  Ampicillin-Sulbactam (UNASYN) 3 g in sodium chloride 0.9 % 100 mL IVPB     3 g 200 mL/hr over 30 Minutes Intravenous  Once 09/01/2017 0953 09/06/2017 1246   08/31/2017 0000   ampicillin-sulbactam (UNASYN) 1.5 g in sodium chloride 0.9 % 100 mL IVPB  Status:  Discontinued     1.5 g 200 mL/hr over 30 Minutes Intravenous Once 08/28/17 1045 09/08/2017 0928   08/19/2017 2200  piperacillin-tazobactam (ZOSYN) IVPB 3.375 g    Note to Pharmacy:  Zosyn 3.375 g IV q12h in ESRD on HD   3.375 g 12.5 mL/hr over 240 Minutes Intravenous Every 12 hours 09/13/2017 1155 08/25/17 1335   08/22/17 1000  piperacillin-tazobactam (ZOSYN) IVPB 3.375 g  Status:  Discontinued    Note to Pharmacy:  Zosyn 3.375 g IV q12h in ESRD on HD   3.375 g 12.5 mL/hr over 240 Minutes Intravenous Every 12 hours 08/22/17 0128 08/26/2017 1155   08/22/17 0045  piperacillin-tazobactam (ZOSYN) IVPB 3.375 g     3.375 g 12.5 mL/hr over 240 Minutes Intravenous  Once  08/22/17 0038 08/22/17 0145       Assessment/Plan HTN ESRD - HD MWF; now on CVVHD H/o breast cancer  Septic shock Acute cholecystitis s/p lap chole4/6/19 by Dr. Redmond Pulling - CT scan 4/8 w/ small amount fluid around liver and layering in pelvis ; HIDA 4/9 positive for bile leak - CT 4/21 with multiple fluid collections -additional drain placed 4/24; Cx negative -ERCP 4/23 w stent placement (Dr Fuller Plan); erosive gastritis noted - continue PPI - continue drains  Shock liver Hyperbilirubinemia - drains stable, suspect 2/2 prolonged TNA/cholestasis Leukocytosis- 15,500 Severe protein-calorie malnutrition- TNA   VDRF secondary to aspiration/PNA-PNA resolved andCCMfollowing - appreciate assistance.  - s/p trach 4/28on full vent support   NSTEMI4/12 LUE DVT4/17  ABL anemia- hgb/hct 9.3/29.7; s/p 1 unit PRBC and 1 unit platelets 4/30 Thrombocytopenia -40,000    ID -zosyn 4/4-4/7,Unasyn 4/11-4/12, Zosyn 4/12 -4/19, Zosyn 4/21-4/29, Eraxis 4/30 >>  FEN -NPO/NGT to LIWS VTE -SCDs Foley -none, anuric Follow up -Dr. Redmond Pulling  Dispo: Repeat blood cultures pending. Continue NPO/TNA. Continue abdominal drains. No acute surgical  intervention indicated. Prognosis grim.     LOS: 28 days    Brigid Re , [facility] 09/19/2017, 7:38 AM Pager: [phone] Consults: [phone] Mon-Fri 7:00 am-4:30 pm Sat-Sun 7:00 am-11:30 am

## 2017-09-19 NOTE — Progress Notes (Signed)
PHARMACY - PHYSICIAN COMMUNICATION CRITICAL VALUE ALERT - BLOOD CULTURE IDENTIFICATION (BCID)  Pam Fuller is an 84 y.o. female who presented to [facility] on 08/23/2017 with a chief complaint of respiratory failure  Assessment:  1/4 BC positive for yeast  Name of physician (or Provider) Contacted: n/a  Current antibiotics: Last dose eraxis was 5/2.  Will f/u in am with team as to whether this should be continued  Changes to prescribed antibiotics recommended:    Results for orders placed or performed during the hospital encounter of 09/02/2017  Blood Culture ID Panel (Reflexed) (Collected: 09/17/2017 11:40 AM)  Result Value Ref Range   Enterococcus species NOT DETECTED NOT DETECTED   Listeria monocytogenes NOT DETECTED NOT DETECTED   Staphylococcus species NOT DETECTED NOT DETECTED   Staphylococcus aureus NOT DETECTED NOT DETECTED   Streptococcus species NOT DETECTED NOT DETECTED   Streptococcus agalactiae NOT DETECTED NOT DETECTED   Streptococcus pneumoniae NOT DETECTED NOT DETECTED   Streptococcus pyogenes NOT DETECTED NOT DETECTED   Acinetobacter baumannii NOT DETECTED NOT DETECTED   Enterobacteriaceae species NOT DETECTED NOT DETECTED   Enterobacter cloacae complex NOT DETECTED NOT DETECTED   Escherichia coli NOT DETECTED NOT DETECTED   Klebsiella oxytoca NOT DETECTED NOT DETECTED   Klebsiella pneumoniae NOT DETECTED NOT DETECTED   Proteus species NOT DETECTED NOT DETECTED   Serratia marcescens NOT DETECTED NOT DETECTED   Haemophilus influenzae NOT DETECTED NOT DETECTED   Neisseria meningitidis NOT DETECTED NOT DETECTED   Pseudomonas aeruginosa NOT DETECTED NOT DETECTED   Candida albicans NOT DETECTED NOT DETECTED   Candida glabrata NOT DETECTED NOT DETECTED   Candida krusei NOT DETECTED NOT DETECTED   Candida parapsilosis NOT DETECTED NOT DETECTED   Candida tropicalis NOT DETECTED NOT DETECTED    Excell Seltzer Poteet 09/19/2017  11:51 PM

## 2017-09-19 NOTE — Progress Notes (Signed)
No charge note.  Palliative care:  Chart reviewed. Spoke w/RN, Anderson Malta. Called [facility] to set-up family meeting. Tells me he is unable to be at the hospital today and is unsure of his schedule tomorrow. He could not continue discussion over phone because he was at a meeting. We discussed trying again tomorrow to setup family meeting. He tells me his brother would want to be at the meeting and he will discuss with him.  Thank you for this consult.  Juel Burrow, DNP, AGNP-C Palliative Medicine Team Team Phone # [phone]

## 2017-09-19 NOTE — Progress Notes (Signed)
PULMONARY / CRITICAL CARE MEDICINE   Name: Pam Fuller MRN: [id number] DOB: August 12, 1932    ADMISSION DATE:  08/30/2017 CONSULTATION DATE:  4/11  REFERRING MD:  Maudie Mercury  CHIEF COMPLAINT:  Respiratory failure  HISTORY OF PRESENT ILLNESS:   84 y/o female with a prolonged hospitalization post lap chole who had a bile leak, multiple abdominal fluid collections, aspiration pneumonia, respiratory failure and ERSD on CVVHD.  SUBJECTIVE:  Weaning pressors  VITAL SIGNS: BP 118/70   Pulse 87   Temp (!) 97.5 F (36.4 C) (Oral)   Resp 19   Ht 5\' 2"  (1.575 m)   Wt 128 lb 12 oz (58.4 kg)   SpO2 100%   BMI 23.55 kg/m   HEMODYNAMICS: CVP:  [3 mmHg] 3 mmHg  VENTILATOR SETTINGS: Vent Mode: PRVC FiO2 (%):  [30 %] 30 % Set Rate:  [14 bmp] 14 bmp Vt Set:  [400 mL] 400 mL PEEP:  [5 cmH20] 5 cmH20 Pressure Support:  [10 cmH20-14 cmH20] 10 cmH20 Plateau Pressure:  [21 cmH20-27 cmH20] 27 cmH20  INTAKE / OUTPUT: I/O last 3 completed shifts: In: 3300.9 [I.V.:2940.9; NG/GT:60; IV Piggyback:300] Out: 3419 [Emesis/NG output:250; Drains:175; Other:1302]  PHYSICAL EXAMINATION:  General:  Chronically ill appearing, in bed on vent HENT: NCAT tracheostomy in place PULM: Rhonchi bilaterally B, vent supported breathing CV: RRR, no mgr GI: minimal bowel sounds, drains in place,  MSK: normal bulk and tone Neuro: sedated on vent     LABS:  BMET Recent Labs  Lab 09/18/17 0446 09/18/17 1532 09/19/17 0355  NA 137 137 136  K 4.4 4.3 4.3  CL 100* 104 104  CO2 28 25 26   BUN 24* 33* 36*  CREATININE 0.39* 0.55 0.49  GLUCOSE 185* 204* 174*    Electrolytes Recent Labs  Lab 09/17/17 0523  09/18/17 0446 09/18/17 1532 09/19/17 0355  CALCIUM 9.4   < > 9.3 9.1 9.2  MG 2.3  --  2.3  --  2.4  PHOS 3.5   < > 3.0 3.4 3.3   < > = values in this interval not displayed.    CBC Recent Labs  Lab 09/17/17 1145 09/18/17 0446 09/19/17 0355  WBC 18.0* 17.6* 15.5*  HGB 8.1* 8.6* 9.3*  HCT  25.0* 27.1* 29.7*  PLT 82* 55* 40*    Coag's Recent Labs  Lab 09/17/17 0523 09/18/17 0446 09/19/17 0355  INR 1.86 2.12 1.90    Sepsis Markers Recent Labs  Lab 09/17/17 0905 09/18/17 0446 09/19/17 0355  PROCALCITON 4.28 4.10 3.64    ABG No results for input(s): PHART, PCO2ART, PO2ART in the last 168 hours.  Liver Enzymes Recent Labs  Lab 09/17/17 0523  09/18/17 0446 09/18/17 1532 09/19/17 0355  AST 165*  --  131*  --  164*  ALT 102*  --  92*  --  128*  ALKPHOS 302*  --  299*  --  345*  BILITOT 25.0*  --  24.1*  --  24.2*  ALBUMIN 2.7*   < > 2.6* 2.5* 2.4*   < > = values in this interval not displayed.    Cardiac Enzymes Recent Labs  Lab 09/17/17 0905  TROPONINI 0.16*    Glucose Recent Labs  Lab 09/18/17 1136 09/18/17 1526 09/18/17 1921 09/18/17 2317 09/19/17 0358 09/19/17 0744  GLUCAP 182* 168* 175* 152* 153* 151*    Imaging Ct Abdomen Pelvis W Contrast  Result Date: 09/18/2017 CLINICAL DATA:  Abdominal distention.  Infection. EXAM: CT ABDOMEN AND PELVIS WITH CONTRAST TECHNIQUE:  Multidetector CT imaging of the abdomen and pelvis was performed using the standard protocol following bolus administration of intravenous contrast. CONTRAST:  131mL OMNIPAQUE IOHEXOL 300 MG/ML  SOLN COMPARISON:  09/08/2017 FINDINGS: Lower chest: Patchy airspace disease in the lung bases is similar to prior. Hepatobiliary: Interval evolution of the large subcapsular fluid collection around the liver. A dominant component along the lateral segment left liver has decreased in the interval with a percutaneous pigtail catheter coiled in the region on today's study, new in the interval. Mild periportal edema without gross intra hepatic biliary duct dilatation. A common bile duct stent is identified with mild distention of the common duct up to about 7 mm. Pancreas: No focal mass lesion. No dilatation of the main duct. No intraparenchymal cyst. No peripancreatic edema. Spleen: Interval  development of segmental areas of subcapsular hypo perfusion in the spleen, mainly anteriorly and inferiorly suggesting infarcts. Small perisplenic fluid collection persists. Adrenals/Urinary Tract: No adrenal nodule or mass. Kidneys are atrophic bilaterally with small bilateral hypoattenuating renal lesions, likely cysts. No hydroureteronephrosis. Bladder is decompressed. Stomach/Bowel: NG tube is identified in the stomach with the tip in the antral region. Stomach is decompressed. Duodenum is normally positioned as is the ligament of Treitz. No small bowel wall thickening. No small bowel dilatation. The terminal ileum is normal. The appendix is not visualized, but there is no edema or inflammation in the region of the cecum. Diverticular changes are seen diffusely along the colon with circumferential low-attenuation wall thickening in the splenic flexure (see image 32/series 3 and image 40/series 3). Colon is diffusely decompressed. Vascular/Lymphatic: There is abdominal aortic atherosclerosis without aneurysm. Portal vein and superior mesenteric vein are patent. There is no gastrohepatic or hepatoduodenal ligament lymphadenopathy. No intraperitoneal or retroperitoneal lymphadenopathy. No pelvic sidewall lymphadenopathy. Reproductive: There is no adnexal mass. Other: Small volume fluid identified in the para colic gutters bilaterally. Anterior peritoneal collection measured previously at 13.4 x 5.6 cm is not substantially changed, measuring 12.4 x 5.0 cm today. Free fluid is identified in the cul-de-sac were there is subtle associated peritoneal enhancement. As above, right paramidline sure cutaneous pigtail catheter has the distal tip coiled adjacent to the lateral segment of the left liver, just anterior to the stomach. Fluid collection in this region is decreased in the interval. A second percutaneous catheter on the right with the tip coiled posterior to the right lobe of the liver is in stable position with  no substantial change in the adjacent fluid collection. Small volume of gas in this fluid collection is new in the interval in compatible with the presence of the drain. Musculoskeletal: Bone windows reveal no worrisome lytic or sclerotic osseous lesions. Superior endplate compression deformity at L1 and L2 is similar to prior. IMPRESSION: 1. Circumferential wall thickening noted in the splenic flexure. Given watershed distribution and the reported clinical history of hypotension, ischemia is a consideration. 2. Interval evolution of the large perihepatic fluid collections. The fluid around the lateral segment left liver has decreased with interval percutaneous drain placement. Collection posterior to the right liver is not substantially changed in the interval with stable position of the drainage catheter. 3. Interval development of subcapsular areas of segmental hypoperfusion in the spleen, suggesting infarcts. Similar volume of perisplenic fluid evident. 4. Dominant apparently loculated fluid collection in the anterior abdomen measures just very minimally smaller in the interval. Electronically Signed   By: Misty Stanley M.D.   On: 09/18/2017 12:48     STUDIES:  CT abd/pelv 4/4 >>  acute cholecystitis, gallstone in GB neck, CBD 10 mm  CT Head: no acute changes, chronic small vessel ischemic changes  CT abd/pelvis 4/8 >> small-mod fluid in abd/pelvis  Hepatobiliary scan 4/9 >> bile leak  TTE 4/12 >> EF 65-70%, G1DD, narrow LVOT, mild TR, mild pHTN  U/S Abdomen 4/14 >> small fluid near cholecystectomy site  Hepatobiliary scan 4/14 >> Rt subhepatic fluid collection consistent w/ biloma; increased in size from prior  CT abd/pelvis w/ oral contrast 4/16 >> drainage catheter at inferior liver margin with decreased perihepatic fluid; low-density fluid at left subdiaphragmatic area, questionable colonic wall thickening throughout, multifocal airspace disease of lung bases  CT abdomen.pelvis  4/21: interval growth of large perihepatic and perisplenic fluid collection with multiple, new intraperitoneal fluid collections. Resolving multifocal pneumonia.   Echo 5/1 LVEF WNL, no other acute abnormalities, mild AS, LA midly dilated  CULTURES: Tracheal aspirate 4/11 >> no growth Abd fluid 4/11 >> no growth 5 days Abd fluid 4/15 >> no growth 3 days  ANTIBIOTICS: Zosyn 4/3 >> 4/7 Unasyn 4/11 Zosyn 4/12 - 4/19 Zosyn 4/21-4/29 09/17/2017 meropenem > 5/2 09/17/2017 eraxis > 5/2    LINES/TUBES: ETT 4/11>4/28 OGT 4/11>4/28 PIV RUE 4/9 RLQ Biliary Drain 4/11 HD Cath LIJ 4/12 >> 4/19 L femoral trialysis catheter 4/21> RIJ cath 4/23>  Tracheostomy 4/28>  SIGNIFICANT EVENTS: 4/4 - admitted w/ acute cholecystitis 4/6 - lap chole 4/9 - bile leak seen on hepatobiliary scan 4/11 - Aspirated during prep for ERCP, intubated and transferred to ICU 4/11 - Rt abd fluid drain placed by IR 4/12- Heparin gtt started for NSTEMI; held after OG bleeding and trop peak 4/13 - worsening liver enzyymes. ESRD - Anuric. CRRT started. On levophed 50mcg and fent 152mcg 4/14 - Hgb 6.0, transfused 2 units PRBCs 4/17 - DVT LUE - started bivalirudin for suspected HIT 4/18 - CVVH d/c'ed, return to intermittent HD.  4/19 - Restarted HD. HIT negative  4/20 - Bleeding from GB drain, OGT, and stools. Received 1u pRBC. Heparin gtt held. 4/21 - Multiple, new fluid collections found on CT abd/pelvis   4/23 - ERCP with cystic duct bile leak s/p stent placement, erosive gastritis, and CBD dilation  4/24 - Mid-abdomen perc drain placed by IR  4/26 - Coffee-ground emesis after restarting tube feeds  4/28 - Tracheostomy  4/29 - Significant bloody output from NGT with drop in Hgb 10->7 09/18/2017 CT of the abdomen > fluid collection 09/18/2017 palliative care consult placed   DISCUSSION:  84 y/o female with ESRD on HD, who is here with anemia, breast cancer here with acute cholecystitis.  S/p lap chole  complicated by bile leak.  Prolonged ICU stay.    ASSESSMENT / PLAN:  PULMONARY A: Acute respiratory failure with hypoxemia Aspiration pneumonia Tracheostomy in place P:   Pressure support as able, slow weaning, unlikely to liberate from the vent anytime soon Trach management per routine  CARDIOVASCULAR A:  Shock: multi-factorial, most recently related to adrenal insufficience NSTEMI (demand ischemia) Prolonged QTc LUE DVT P:  Tele Wean off of levophed for MAP > 65 Continue stress dose steroids  RENAL A:   ESRD on HD baseline AV fistula in LUE P:   Continue CVVHD Monitor electrolytes  GASTROINTESTINAL A:   Severe protein calorie malnutrition Acute cholecystitis s/p lap chole 4/6 Bile leak s/p biliary stent via ERCP Biloma Erosive gastritis noted on ERCP 4/23 Hyper bilirubinemia likely due to intrahepatic  P:   Continue PPI Monitor drains Continue TPN F/u surgery consult  HEMATOLOGIC A:   Anemia, no bleeding  Thrombocytopenia > worsening P:  Monitor for bleeding Transfuse Hgb if < 7gm/dL  INFECTIOUS A:   Peritonitis Aspiration pneumonia s/p treatment Biloma/intraperitoneal abscess Multiple drains placed in abscesses/biloma (intrahepatic) P:   Hold antibiotics Monitor blood cultures  ENDOCRINE A:   Intermittent hypoglycemia P:   Continue TPN Monitor glucose  NEUROLOGIC A:   Deconditioning Pain P:   RASS goal: 0 to -1 Sedation as needed per PAD protocol   FAMILY  - Updates: 5/1 updated son Mateo Flow again and let him know that I am concerned about her prognosis and will be consulting palliative care.    My cc time 35 minutes  Roselie Awkward, MD [facility] Pager: [phone] Cell: [phone] After 3pm or if no response, call [phone]   09/19/2017, 11:23 AM

## 2017-09-20 LAB — RENAL FUNCTION PANEL
ALBUMIN: 2 g/dL — AB (ref 3.5–5.0)
ANION GAP: 8 (ref 5–15)
BUN: 40 mg/dL — ABNORMAL HIGH (ref 6–20)
CO2: 25 mmol/L (ref 22–32)
Calcium: 8.9 mg/dL (ref 8.9–10.3)
Chloride: 102 mmol/L (ref 101–111)
Creatinine, Ser: 0.62 mg/dL (ref 0.44–1.00)
GFR calc Af Amer: >60 mL/min (ref >60)
GFR calc non Af Amer: >60 mL/min (ref >60)
GLUCOSE: 255 mg/dL — AB (ref 65–99)
PHOSPHORUS: 3.7 mg/dL (ref 2.5–4.6)
Potassium: 4.1 mmol/L (ref 3.5–5.1)
Sodium: 135 mmol/L (ref 135–145)

## 2017-09-20 LAB — COMPREHENSIVE METABOLIC PANEL
ALBUMIN: 2.2 g/dL — AB (ref 3.5–5.0)
ALK PHOS: 358 U/L — AB (ref 38–126)
ALT: 190 U/L — AB (ref 14–54)
AST: 245 U/L — ABNORMAL HIGH (ref 15–41)
Anion gap: 7 (ref 5–15)
BUN: 42 mg/dL — ABNORMAL HIGH (ref 6–20)
CALCIUM: 9.1 mg/dL (ref 8.9–10.3)
CO2: 26 mmol/L (ref 22–32)
CREATININE: 0.67 mg/dL (ref 0.44–1.00)
Chloride: 104 mmol/L (ref 101–111)
GFR calc Af Amer: >60 mL/min (ref >60)
GFR calc non Af Amer: >60 mL/min (ref >60)
GLUCOSE: 174 mg/dL — AB (ref 65–99)
Potassium: 4.4 mmol/L (ref 3.5–5.1)
SODIUM: 137 mmol/L (ref 135–145)
Total Bilirubin: 24.4 mg/dL (ref 0.3–1.2)
Total Protein: 6.1 g/dL — ABNORMAL LOW (ref 6.5–8.1)

## 2017-09-20 LAB — GLUCOSE, CAPILLARY
GLUCOSE-CAPILLARY: 146 mg/dL — AB (ref 65–99)
GLUCOSE-CAPILLARY: 210 mg/dL — AB (ref 65–99)
GLUCOSE-CAPILLARY: 217 mg/dL — AB (ref 65–99)
Glucose-Capillary: 105 mg/dL — ABNORMAL HIGH (ref 65–99)
Glucose-Capillary: 147 mg/dL — ABNORMAL HIGH (ref 65–99)
Glucose-Capillary: 150 mg/dL — ABNORMAL HIGH (ref 65–99)
Glucose-Capillary: 196 mg/dL — ABNORMAL HIGH (ref 65–99)

## 2017-09-20 LAB — CBC
HEMATOCRIT: 29.8 % — AB (ref 36.0–46.0)
HEMOGLOBIN: 9.5 g/dL — AB (ref 12.0–15.0)
MCH: 31.9 pg (ref 26.0–34.0)
MCHC: 31.9 g/dL (ref 30.0–36.0)
MCV: 100 fL (ref 78.0–100.0)
Platelets: 40 10*3/uL — ABNORMAL LOW (ref 150–400)
RBC: 2.98 MIL/uL — ABNORMAL LOW (ref 3.87–5.11)
RDW: 27 % — ABNORMAL HIGH (ref 11.5–15.5)
WBC: 16.9 10*3/uL — ABNORMAL HIGH (ref 4.0–10.5)

## 2017-09-20 LAB — MAGNESIUM: Magnesium: 2.2 mg/dL (ref 1.7–2.4)

## 2017-09-20 LAB — PROTIME-INR
INR: 1.88
Prothrombin Time: 21.5 seconds — ABNORMAL HIGH (ref 11.4–15.2)

## 2017-09-20 LAB — PHOSPHORUS: Phosphorus: 3.7 mg/dL (ref 2.5–4.6)

## 2017-09-20 MED ORDER — DEXTROSE 10 % IV SOLN
INTRAVENOUS | Status: DC
  Administered 2017-09-20 – 2017-09-22 (×4): via INTRAVENOUS

## 2017-09-20 MED ORDER — SODIUM CHLORIDE 0.9 % IV SOLN
100.0000 mg | INTRAVENOUS | Status: DC
  Administered 2017-09-20 – 2017-09-23 (×4): 100 mg via INTRAVENOUS
  Filled 2017-09-20 (×4): qty 100

## 2017-09-20 NOTE — Progress Notes (Signed)
PULMONARY / CRITICAL CARE MEDICINE   Name: Pam Fuller MRN: [id number] DOB: [birth year]    ADMISSION DATE:  09/15/2017 CONSULTATION DATE:  4/11  REFERRING MD:  Maudie Mercury  CHIEF COMPLAINT:  Respiratory failure  HISTORY OF PRESENT ILLNESS:   84 y/o female with a prolonged hospitalization post lap chole who had a bile leak, multiple abdominal fluid collections, aspiration pneumonia, respiratory failure and ERSD on CVVHD.  SUBJECTIVE:  Bloody output per rectum x2 this morning with some mucus   VITAL SIGNS: BP (!) 114/52   Pulse 82   Temp (!) 97.5 F (36.4 C) (Oral)   Resp 18   Ht 5\' 2"  (1.575 m)   Wt 138 lb 14.2 oz (63 kg)   SpO2 99%   BMI 25.40 kg/m   HEMODYNAMICS: CVP:  [5 mmHg] 5 mmHg  VENTILATOR SETTINGS: Vent Mode: PSV;CPAP FiO2 (%):  [30 %] 30 % Set Rate:  [14 bmp] 14 bmp Vt Set:  [400 mL] 400 mL PEEP:  [5 cmH20] 5 cmH20 Pressure Support:  [10 cmH20] 10 cmH20 Plateau Pressure:  [17 cmH20-26 cmH20] 18 cmH20  INTAKE / OUTPUT: I/O last 3 completed shifts: In: 3077.8 [I.V.:2737.8; Other:10; IV Piggyback:330] Out: 2191 [Emesis/NG output:275; Drains:145; Other:1771]  PHYSICAL EXAMINATION:   General:  Chronically ill in bed on vent HENT: NCAT trach PULM: CTA B, vent supported breathing CV: RRR, no mgr GI: BS+, soft, nontender MSK: diminished Neuro: minimally responsive   LABS:  BMET Recent Labs  Lab 09/19/17 0355 09/19/17 1453 09/20/17 0417  NA 136 137 137  K 4.3 3.8 4.4  CL 104 107 104  CO2 26 22 26   BUN 36* 36* 42*  CREATININE 0.49 0.63 0.67  GLUCOSE 174* 197* 174*    Electrolytes Recent Labs  Lab 09/18/17 0446  09/19/17 0355 09/19/17 1453 09/20/17 0417  CALCIUM 9.3   < > 9.2 8.1* 9.1  MG 2.3  --  2.4  --  2.2  PHOS 3.0   < > 3.3 3.4 3.7   < > = values in this interval not displayed.    CBC Recent Labs  Lab 09/18/17 0446 09/19/17 0355 09/20/17 0417  WBC 17.6* 15.5* 16.9*  HGB 8.6* 9.3* 9.5*  HCT 27.1* 29.7* 29.8*  PLT 55*  40* 40*    Coag's Recent Labs  Lab 09/18/17 0446 09/19/17 0355 09/20/17 0417  INR 2.12 1.90 1.88    Sepsis Markers Recent Labs  Lab 09/17/17 0905 09/18/17 0446 09/19/17 0355  PROCALCITON 4.28 4.10 3.64    ABG No results for input(s): PHART, PCO2ART, PO2ART in the last 168 hours.  Liver Enzymes Recent Labs  Lab 09/18/17 0446  09/19/17 0355 09/19/17 1453 09/20/17 0417  AST 131*  --  164*  --  245*  ALT 92*  --  128*  --  190*  ALKPHOS 299*  --  345*  --  358*  BILITOT 24.1*  --  24.2*  --  24.4*  ALBUMIN 2.6*   < > 2.4* 2.0* 2.2*   < > = values in this interval not displayed.    Cardiac Enzymes Recent Labs  Lab 09/17/17 0905  TROPONINI 0.16*    Glucose Recent Labs  Lab 09/19/17 1109 09/19/17 1622 09/19/17 1950 09/19/17 2339 09/20/17 0330 09/20/17 0710  GLUCAP 148* 199* 165* 146* 150* 147*    Imaging No results found.   STUDIES:  CT abd/pelv 4/4 >> acute cholecystitis, gallstone in GB neck, CBD 10 mm  CT Head: no acute changes, chronic small  vessel ischemic changes  CT abd/pelvis 4/8 >> small-mod fluid in abd/pelvis  Hepatobiliary scan 4/9 >> bile leak  TTE 4/12 >> EF 65-70%, G1DD, narrow LVOT, mild TR, mild pHTN  U/S Abdomen 4/14 >> small fluid near cholecystectomy site  Hepatobiliary scan 4/14 >> Rt subhepatic fluid collection consistent w/ biloma; increased in size from prior  CT abd/pelvis w/ oral contrast 4/16 >> drainage catheter at inferior liver margin with decreased perihepatic fluid; low-density fluid at left subdiaphragmatic area, questionable colonic wall thickening throughout, multifocal airspace disease of lung bases  CT abdomen.pelvis 4/21: interval growth of large perihepatic and perisplenic fluid collection with multiple, new intraperitoneal fluid collections. Resolving multifocal pneumonia.   Echo 5/1 LVEF WNL, no other acute abnormalities, mild AS, LA midly dilated  CULTURES: Tracheal aspirate 4/11 >> no  growth Abd fluid 4/11 >> no growth 5 days Abd fluid 4/15 >> no growth 3 days Abd fluid 4/25 > no growth 4/30 blood culture > 1/4 yeast  ANTIBIOTICS: Zosyn 4/3 >> 4/7 Unasyn 4/11 Zosyn 4/12 - 4/19 Zosyn 4/21-4/29 09/17/2017 meropenem > 5/2 09/17/2017 eraxis > 5/2  Eraxis 5/3 >    LINES/TUBES: ETT 4/11>4/28 OGT 4/11>4/28 PIV RUE 4/9 RLQ Biliary Drain 4/11 HD Cath LIJ 4/12 >> 4/19 L femoral trialysis catheter 4/21> RIJ cath 4/23>  Tracheostomy 4/28>  SIGNIFICANT EVENTS: 4/4 - admitted w/ acute cholecystitis 4/6 - lap chole 4/9 - bile leak seen on hepatobiliary scan 4/11 - Aspirated during prep for ERCP, intubated and transferred to ICU 4/11 - Rt abd fluid drain placed by IR 4/12- Heparin gtt started for NSTEMI; held after OG bleeding and trop peak 4/13 - worsening liver enzyymes. ESRD - Anuric. CRRT started. On levophed 24mcg and fent 152mcg 4/14 - Hgb 6.0, transfused 2 units PRBCs 4/17 - DVT LUE - started bivalirudin for suspected HIT 4/18 - CVVH d/c'ed, return to intermittent HD.  4/19 - Restarted HD. HIT negative  4/20 - Bleeding from GB drain, OGT, and stools. Received 1u pRBC. Heparin gtt held. 4/21 - Multiple, new fluid collections found on CT abd/pelvis   4/23 - ERCP with cystic duct bile leak s/p stent placement, erosive gastritis, and CBD dilation  4/24 - Mid-abdomen perc drain placed by IR  4/26 - Coffee-ground emesis after restarting tube feeds  4/28 - Tracheostomy  4/29 - Significant bloody output from NGT with drop in Hgb 10->7 09/18/2017 CT of the abdomen > fluid collection 09/18/2017 palliative care consult placed 09/20/2017 yeast in blood culture    DISCUSSION:  84 y/o female with ESRD on HD, who is here with anemia, breast cancer here with acute cholecystitis.  S/p lap chole complicated by bile leak.  Prolonged ICU stay.    ASSESSMENT / PLAN:  PULMONARY A: Acute respiratory failure with hypoxemia Aspiration pneumonia Tracheostomy in place P:    Pressure support as able Trach care per routine  CARDIOVASCULAR A:  Shock: multi-factorial, most recently related to adrenal insufficiency> improved NSTEMI (demand ischemia) Prolonged QTc LUE DVT P:  Tele Continue stress dose steroids Wean off levophed  RENAL A:   ESRD on HD baseline AV fistula in LUE P:   Continue CVVHD Monitor electrolytes  GASTROINTESTINAL A:   Severe protein calorie malnutrition Acute cholecystitis s/p lap chole 4/6 Bile leak s/p biliary stent via ERCP Biloma Erosive gastritis noted on ERCP 4/23 Hyper bilirubinemia likely due to intrahepatic  P:   Continue PPI Hold TPN > replace with D10  HEMATOLOGIC A:   Anemia, no bleeding  Thrombocytopenia >  worsening P:  Monitor for bleeding Transfuse for Hgb < 7gm/dL  INFECTIOUS A:   Peritonitis Aspiration pneumonia s/p treatment Biloma/intraperitoneal abscess Multiple drains placed in abscesses/biloma (intrahepatic) Fungemia P:   Would like to D/C all central lines but she has limited access and needs them for nutrition/CVVHD If family decides for continued aggressive support would place midline and pull CVL and HD cath  ENDOCRINE A:   Intermittent hypoglycemia P:   Monitor glucose  NEUROLOGIC A:   Deconditioning Pain P:   RASS goal 0 to -1 PAD Protocol continuous fentanyl   FAMILY  - Updates: 5/3 I updated her son today at length and informed him that I think she will not survive this.  He voiced understanding and said that he needs to "be realistic".  He wants to have a goals of care conversation with his brother present.  My cc time 45 minutes  Roselie Awkward, MD [facility] Pager: [phone] Cell: [phone] After 3pm or if no response, call [phone]   09/20/2017, 10:45 AM

## 2017-09-20 NOTE — Progress Notes (Signed)
Pam Fuller Progress Note  Subjective: +yeast in blood cx x 1  Vitals:   09/20/17 1000 09/20/17 1100 09/20/17 1153 09/20/17 1200  BP: (!) 114/52 (!) 107/48 (!) 112/50 (!) 110/53  Pulse:   77   Resp: _0 Temp:      TempSrc:      SpO2:   100%   Weight:      Height:        Inpatient medications: . chlorhexidine gluconate (MEDLINE KIT)  15 mL Mouth Rinse BID  . Chlorhexidine Gluconate Cloth  6 each Topical Daily  . darbepoetin (ARANESP) injection - DIALYSIS  150 mcg Subcutaneous Q Mon-1800  . doxercalciferol  2 mcg Intravenous Q M,W,F-1800  . hydrocortisone sod succinate (SOLU-CORTEF) inj  50 mg Intravenous Q6H  . insulin aspart  0-15 Units Subcutaneous Q4H  . mouth rinse  15 mL Mouth Rinse QID  . pantoprazole (PROTONIX) IV  40 mg Intravenous Q12H  . sodium chloride flush  10-40 mL Intracatheter Q12H   . sodium chloride 10 mL/hr at 09/20/17 1200  . anidulafungin Stopped (09/20/17 1031)  . dextrose 50 mL/hr at 09/20/17 1200  . fentaNYL infusion INTRAVENOUS 125 mcg/hr (09/20/17 1200)  . norepinephrine (LEVOPHED) Adult infusion 0.96 mcg/min (09/20/17 1200)  . dialysis replacement fluid (prismasate) 400 mL/hr at 09/20/17 0907  . dialysis replacement fluid (prismasate) 200 mL/hr at 09/18/17 1032  . dialysate (PRISMASATE) 1,300 mL/hr at 09/20/17 1205  . sodium chloride 999 mL/hr at 09/15/17 0119  . TPN ADULT (ION) 50 mL/hr at 09/20/17 1200  . vasopressin (PITRESSIN) infusion - *FOR SHOCK* Stopped (09/17/17 1500)   albuterol, alteplase, docusate, fentaNYL (SUBLIMAZE) injection, heparin, midazolam, sodium chloride  Exam: On vent, not responsive trach in place No jvd Chest coarse bilat RRR no mrg abd soft ntnd  abd drains in place Ext 1-2+ edema upper extremities nf not following commands eyes open lua avg w bruit   [facility]h 69mn  61.5kg   2/2.25 bath p4  AVG  Heparin 4000 - hect 2 ug q hd iv - mircera 30 ug every 2 wks iv at dialysis        Impression: 1 cholecystitis: sp lap chole w bile leak 2 jaundice w suspected cirrhosis and liver failure 3 biloma/intraperitoneal abscesses - drains placed 4 septic shock - on pressors x 1 5 esrd - usual hd mwf , cont crrt uf as tolerated 6 vdrf - sp trach 7 anemia ckd/ abl - on darbe 150 ug weekly, hb 9- 10 8 gi bleed gastritis seen at [facility], sp multiple prbc's and ffp / plts 9 poor prognosis   Plan - as above   RKelly SplinterMD CKentuckyKidney Fuller pager [phone]  09/20/2017, 1:04 PM   Recent Labs  Lab 09/19/17 0355 09/19/17 1453 09/20/17 0417  NA 136 137 137  K 4.3 3.8 4.4  CL 104 107 104  CO2 _1 GLUCOSE 174* 197* 174*  BUN 36* 36* 42*  CREATININE 0.49 0.63 0.67  CALCIUM 9.2 8.1* 9.1  PHOS 3.3 3.4 3.7   Recent Labs  Lab 09/18/17 0446  09/19/17 0355 09/19/17 1453 09/20/17 0417  AST 131*  --  164*  --  245*  ALT 92*  --  128*  --  190*  ALKPHOS 299*  --  345*  --  358*  BILITOT 24.1*  --  24.2*  --  24.4*  PROT 5.6*  --  6.0*  --  6.1*  ALBUMIN 2.6*   < > 2.4* 2.0* 2.2*   < > = values in this interval not displayed.   Recent Labs  Lab 09/16/17 0510  09/18/17 0446 09/19/17 0355 09/20/17 0417  WBC 16.5*   < > 17.6* 15.5* 16.9*  NEUTROABS 14.4*  --   --  14.4*  --   HGB 10.1*   < > 8.6* 9.3* 9.5*  HCT 31.3*   < > 27.1* 29.7* 29.8*  MCV 97.2   < > 97.5 99.7 100.0  PLT 33*   < > 55* 40* 40*   < > = values in this interval not displayed.   Iron/TIBC/Ferritin/ %Sat    Component Value Date/Time   IRON 84 04/15/2010 1030   TIBC 178 (L) 04/15/2010 1030   FERRITIN 784 (H) 04/15/2010 1030   IRONPCTSAT 47 04/15/2010 1030

## 2017-09-20 NOTE — Progress Notes (Signed)
No charge note.  Palliative care:   Met w/son, Mateo Flow at bedside this AM. Mateo Flow tells me he does not want to talk to palliative until he speaks to Dr. [place] Bells.  Followed up this afternoon and he tells me he wants to wait for his brother to be available.   Meeting scheduled for 5/4 13:00  [place], DNP, [facility] Palliative Medicine Team Team Phone # [phone]

## 2017-09-20 NOTE — Consult Note (Signed)
[facility]    Date of Admission:  09/06/2017           Total days of antibiotics  Meropenem 4/30>           Zosyn 4/3-4/7, 4/12-19, 4/21-4/28           Eraxis 4/30-5/2       Reason for Consult: Fungemia     Referring Provider: Dr. Lake Bells  Primary Care Provider: None   Assessment: Pam Fuller is a 84 y.o. female with PMH of ESRD, hx of breast cancer, and HTN who presented on 4/4 with acute cholecystitis and had lap chole on 4/6 that was complicated by a bile leak. Required admission to ICU and course complicated by  aspiration PNA, NSTEMI, LUE DVT, biliary peritonitis, shock liver, upper GI bleed, and acute blood loss anemia requiring multiple transfusions. Recent CT abdomen/pelvis suggestive of liver cirrhosis and ischemia colitis. Has been on TPN since 4/23 and now with fungemia.   Plan: 1. Re-start Eraxis  2. Await speciation of yeast  3. Likely secondary to prolong TPN   Principal Problem:   Acute cholecystitis Active Problems:   ESRD on dialysis [facility])   Essential hypertension   GERD (gastroesophageal reflux disease)   Bile leak   Acute respiratory failure (HCC)   Aspiration into airway   ESRD on hemodialysis (HCC)   Malnutrition of moderate degree   Septic shock (HCC)   NSTEMI (non-ST elevated myocardial infarction) ([place])   Shock liver   Anastomotic leak of biliary tree   Coagulopathy (HCC)   Bile peritonitis (HCC)   UGIB (upper gastrointestinal bleed)   Pressure injury of skin   Scheduled Meds: . chlorhexidine gluconate (MEDLINE KIT)  15 mL Mouth Rinse BID  . Chlorhexidine Gluconate Cloth  6 each Topical Daily  . darbepoetin (ARANESP) injection - DIALYSIS  150 mcg Subcutaneous Q Mon-1800  . doxercalciferol  2 mcg Intravenous Q M,W,F-1800  . hydrocortisone sod succinate (SOLU-CORTEF) inj  50 mg Intravenous Q6H  . insulin aspart  0-15 Units Subcutaneous Q4H  . mouth rinse  15 mL Mouth Rinse QID  . pantoprazole (PROTONIX)  IV  40 mg Intravenous Q12H  . sodium chloride flush  10-40 mL Intracatheter Q12H   Continuous Infusions: . sodium chloride 10 mL/hr at 09/20/17 0800  . fentaNYL infusion INTRAVENOUS 125 mcg/hr (09/20/17 0800)  . norepinephrine (LEVOPHED) Adult infusion 1 mcg/min (09/20/17 0819)  . dialysis replacement fluid (prismasate) 400 mL/hr at 09/19/17 4801  . dialysis replacement fluid (prismasate) 200 mL/hr at 09/18/17 1032  . dialysate (PRISMASATE) 1,300 mL/hr at 09/20/17 0810  . sodium chloride 999 mL/hr at 09/15/17 0119  . TPN ADULT (ION) 50 mL/hr at 09/20/17 0800  . vasopressin (PITRESSIN) infusion - *FOR SHOCK* Stopped (09/17/17 1500)   PRN Meds:.albuterol, alteplase, docusate, fentaNYL (SUBLIMAZE) injection, heparin, midazolam, sodium chloride  HPI: Pam Fuller is a 84 y.o. female with PMH of ESRD, hx of breast cancer, and HTN who presented on 4/4 with acute cholecystitis and had lap chole on 4/6 that was complicated by a bile leak. During prep for ERCP she aspirated and became hemodynamically unstable requiring intubation and admission to the ICU. Her ICU course has been complicated by aspiration PNA, NSTEMI, LUE DVT, biliary peritonitis, shock liver, upper GI bleed, acute blood loss anemia requiring multiple transfusions, splenic infarcts, and ischemic colitis. She previously completed 3 courses of Zosyn. She has also been unable to tolerate tube  feeds and has been on TPN since 4/23. She has been persistently hypotensive without a clear source of infection (Bcx and biliary drain cx negative) and she was placed on Eraxis empirically due to concern for fungemia. Blood cx from 4/30 with rare yeast. This AM she is sedated and not following commands. On low dose norepi.     Review of Systems: Unable to obtain, patient sedated and not responding to questions   Past Medical History:  Diagnosis Date  . Arthritis   . Blood transfusion without reported diagnosis   . Cancer [facility])    patient  states she had br ca 13 years ago  . ESRD (end stage renal disease) on dialysis ([place])   . Hypertension   . Renal disorder    patient on dialysis  . Wears glasses     Social History   Tobacco Use  . Smoking status: Never Smoker  . Smokeless tobacco: Never Used  Substance Use Topics  . Alcohol use: No  . Drug use: No    Family History  Problem Relation Age of Onset  . Cancer Maternal Aunt        breast   Allergies  Allergen Reactions  . Erythromycin Nausea And Vomiting  . Vicodin [Hydrocodone-Acetaminophen]     unknown    OBJECTIVE: Blood pressure (!) 107/55, pulse 81, temperature (!) 97.5 F (36.4 C), temperature source Oral, resp. rate (!) 21, height _0  (1.575 m), weight 138 lb 14.2 oz (63 kg), SpO2 100 %.  Physical Exam  General: Chronically ill appearing female, sedated, NAD  HEENT: [place]/AT, scleral icterus present, PERRL, trach and NG tube in place Cardiac:  RRR, Nl S1/S2, no mrg  Pulm: CTAB, no increased WOB  Abd: Soft, NT, ND and decreased bowel sounds, 2 biliary drains in upper abdomen with minimal bilious output   Ext: LUE edema L>>R, L AVF with palpable thrill, femoral trialysis cathter in place with dressing c/d/i, ischemic wounds on bilateral feet  Neuro: sedated, does not follow commands   Lab Results Lab Results  Component Value Date   WBC 16.9 (H) 09/20/2017   HGB 9.5 (L) 09/20/2017   HCT 29.8 (L) 09/20/2017   MCV 100.0 09/20/2017   PLT 40 (L) 09/20/2017    Lab Results  Component Value Date   CREATININE 0.67 09/20/2017   BUN 42 (H) 09/20/2017   NA 137 09/20/2017   K 4.4 09/20/2017   CL 104 09/20/2017   CO2 26 09/20/2017    Lab Results  Component Value Date   ALT 190 (H) 09/20/2017   AST 245 (H) 09/20/2017   ALKPHOS 358 (H) 09/20/2017   BILITOT 24.4 (HH) 09/20/2017     Microbiology: Recent Results (from the past 240 hour(s))  Body fluid culture     Status: None   Collection Time: 09/12/17 11:47 AM  Result Value Ref Range Status    Specimen Description ABDOMEN  Final   Special Requests NONE  Final   Gram Stain   Final    MODERATE WBC PRESENT, PREDOMINANTLY PMN NO ORGANISMS SEEN    Culture   Final    NO GROWTH 3 DAYS Performed at [facility], [address], [place], [place] [address]    Report Status 09/15/2017 FINAL  Final  Culture, blood (routine x 2)     Status: None (Preliminary result)   Collection Time: 09/17/17 11:30 AM  Result Value Ref Range Status   Specimen Description BLOOD RIGHT ANTECUBITAL  Final  Special Requests   Final    BOTTLES DRAWN AEROBIC AND ANAEROBIC Blood Culture adequate volume   Culture   Final    NO GROWTH 2 DAYS Performed at [facility], [place] [address], [place], [place] [address]    Report Status PENDING  Incomplete  Culture, blood (routine x 2)     Status: None (Preliminary result)   Collection Time: 09/17/17 11:40 AM  Result Value Ref Range Status   Specimen Description BLOOD RIGHT FOREARM  Final   Special Requests   Final    BOTTLES DRAWN AEROBIC AND ANAEROBIC Blood Culture adequate volume   Culture  Setup Time   Final    YEAST AEROBIC BOTTLE ONLY Organism ID to follow CRITICAL RESULT CALLED TO, READ BACK BY AND VERIFIED WITH: L SEAY PHARMD 09/19/17 2343 JDW Performed at [facility], [address] [place], [place] [address]    Culture PENDING  Incomplete   Report Status PENDING  Incomplete  Blood Culture ID Panel (Reflexed)     Status: None   Collection Time: 09/17/17 11:40 AM  Result Value Ref Range Status   Enterococcus species NOT DETECTED NOT DETECTED Final    Comment: CRITICAL RESULT CALLED TO, READ BACK BY AND VERIFIED WITH: L SEAY PHARMD 09/19/17 2343 JDW    Listeria monocytogenes NOT DETECTED NOT DETECTED Final   Staphylococcus species NOT DETECTED NOT DETECTED Final   Staphylococcus aureus NOT DETECTED NOT DETECTED Final   Streptococcus species NOT DETECTED NOT DETECTED Final   Streptococcus agalactiae NOT DETECTED NOT DETECTED Final    Streptococcus pneumoniae NOT DETECTED NOT DETECTED Final   Streptococcus pyogenes NOT DETECTED NOT DETECTED Final   Acinetobacter baumannii NOT DETECTED NOT DETECTED Final   Enterobacteriaceae species NOT DETECTED NOT DETECTED Final   Enterobacter cloacae complex NOT DETECTED NOT DETECTED Final   Escherichia coli NOT DETECTED NOT DETECTED Final   Klebsiella oxytoca NOT DETECTED NOT DETECTED Final   Klebsiella pneumoniae NOT DETECTED NOT DETECTED Final   Proteus species NOT DETECTED NOT DETECTED Final   Serratia marcescens NOT DETECTED NOT DETECTED Final   Haemophilus influenzae NOT DETECTED NOT DETECTED Final   Neisseria meningitidis NOT DETECTED NOT DETECTED Final   Pseudomonas aeruginosa NOT DETECTED NOT DETECTED Final   Candida albicans NOT DETECTED NOT DETECTED Final   Candida glabrata NOT DETECTED NOT DETECTED Final   Candida krusei NOT DETECTED NOT DETECTED Final   Candida parapsilosis NOT DETECTED NOT DETECTED Final   Candida tropicalis NOT DETECTED NOT DETECTED Final    Comment: Performed at [facility], [address] [place], [place] [address]    Welford Roche, [place] [facility] [place] [phone] pager   [phone] cell 09/20/2017, 8:31 AM

## 2017-09-20 NOTE — Progress Notes (Addendum)
Patient requires line holiday therefore will not receive new TPN this PM.  Franchot Pollitt S. Alford Highland, PharmD, [place] Clinical Staff Pharmacist Pager [phone]

## 2017-09-20 NOTE — Progress Notes (Signed)
Pt placed back on full vent support to rest.  RN at bedside.

## 2017-09-20 NOTE — Progress Notes (Signed)
Patient seen for trach team follow up.  All needed equipment at the bedside.  No education needed at this time.  Will continue to follow for progression.  

## 2017-09-21 DIAGNOSIS — B49 Unspecified mycosis: Secondary | ICD-10-CM | POA: Diagnosis present

## 2017-09-21 DIAGNOSIS — Z7189 Other specified counseling: Secondary | ICD-10-CM

## 2017-09-21 DIAGNOSIS — L02211 Cutaneous abscess of abdominal wall: Secondary | ICD-10-CM

## 2017-09-21 DIAGNOSIS — R1084 Generalized abdominal pain: Secondary | ICD-10-CM

## 2017-09-21 DIAGNOSIS — Z515 Encounter for palliative care: Secondary | ICD-10-CM

## 2017-09-21 LAB — RENAL FUNCTION PANEL
Albumin: 1.8 g/dL — ABNORMAL LOW (ref 3.5–5.0)
Anion gap: 8 (ref 5–15)
BUN: 27 mg/dL — AB (ref 6–20)
CHLORIDE: 100 mmol/L — AB (ref 101–111)
CO2: 25 mmol/L (ref 22–32)
CREATININE: 0.62 mg/dL (ref 0.44–1.00)
Calcium: 8.9 mg/dL (ref 8.9–10.3)
GFR calc Af Amer: >60 mL/min (ref >60)
GFR calc non Af Amer: >60 mL/min (ref >60)
Glucose, Bld: 144 mg/dL — ABNORMAL HIGH (ref 65–99)
Phosphorus: 4 mg/dL (ref 2.5–4.6)
Potassium: 4.3 mmol/L (ref 3.5–5.1)
Sodium: 133 mmol/L — ABNORMAL LOW (ref 135–145)

## 2017-09-21 LAB — GLUCOSE, CAPILLARY
GLUCOSE-CAPILLARY: 125 mg/dL — AB (ref 65–99)
GLUCOSE-CAPILLARY: 90 mg/dL (ref 65–99)
Glucose-Capillary: 109 mg/dL — ABNORMAL HIGH (ref 65–99)
Glucose-Capillary: 152 mg/dL — ABNORMAL HIGH (ref 65–99)
Glucose-Capillary: 105 mg/dL — ABNORMAL HIGH (ref 65–99)
Glucose-Capillary: 155 mg/dL — ABNORMAL HIGH (ref 65–99)

## 2017-09-21 LAB — CBC
HCT: 29.8 % — ABNORMAL LOW (ref 36.0–46.0)
Hemoglobin: 9.7 g/dL — ABNORMAL LOW (ref 12.0–15.0)
MCH: 32.6 pg (ref 26.0–34.0)
MCHC: 32.6 g/dL (ref 30.0–36.0)
MCV: 100 fL (ref 78.0–100.0)
PLATELETS: 50 10*3/uL — AB (ref 150–400)
RBC: 2.98 MIL/uL — ABNORMAL LOW (ref 3.87–5.11)
RDW: 27.5 % — AB (ref 11.5–15.5)
WBC: 19.8 10*3/uL — ABNORMAL HIGH (ref 4.0–10.5)

## 2017-09-21 LAB — COMPREHENSIVE METABOLIC PANEL
ALBUMIN: 1.9 g/dL — AB (ref 3.5–5.0)
ALK PHOS: 351 U/L — AB (ref 38–126)
ALT: 197 U/L — AB (ref 14–54)
AST: 226 U/L — ABNORMAL HIGH (ref 15–41)
Anion gap: 7 (ref 5–15)
BUN: 31 mg/dL — ABNORMAL HIGH (ref 6–20)
CALCIUM: 9.1 mg/dL (ref 8.9–10.3)
CO2: 26 mmol/L (ref 22–32)
CREATININE: 0.58 mg/dL (ref 0.44–1.00)
Chloride: 101 mmol/L (ref 101–111)
GFR calc Af Amer: >60 mL/min (ref >60)
GFR calc non Af Amer: >60 mL/min (ref >60)
GLUCOSE: 135 mg/dL — AB (ref 65–99)
Potassium: 4.2 mmol/L (ref 3.5–5.1)
SODIUM: 134 mmol/L — AB (ref 135–145)
Total Bilirubin: 23.8 mg/dL (ref 0.3–1.2)
Total Protein: 6.2 g/dL — ABNORMAL LOW (ref 6.5–8.1)

## 2017-09-21 LAB — PHOSPHORUS: Phosphorus: 3.6 mg/dL (ref 2.5–4.6)

## 2017-09-21 LAB — MAGNESIUM: Magnesium: 2.2 mg/dL (ref 1.7–2.4)

## 2017-09-21 NOTE — Consult Note (Signed)
Consultation Note Date: 09/21/2017   Patient Name: Pam Fuller  DOB: 1932-12-30  MRN: [id number]  Age / Sex: 84 y.o., female  PCP: No primary care provider on file. Referring Physician: Juanito Doom, MD  Reason for Consultation: Establishing goals of care and Psychosocial/spiritual support  HPI/Patient Profile: 84 y.o. female  with past medical history of end-stage renal disease on hemodialysis, breast cancer (initial diagnosis in 2001; recurrence 2014) admitted on 09/13/2017 with abdominal pain, surgical evaluation.  Patient underwent a cholecystectomy on 09/02/2017.  Per HIDDA scan, on 08/26/2017, bile leak was detected.  On 09/08/2017 multiple fluid collections were seen on CT scan and additional abdominal drains were placed on 09/11/2017.  Patient had been intubated and was unable to extubate. She underwent a tracheostomy on 09/15/2017, still requiring ventilator support.  She has been on TNA up until 24 hours ago.  Hospitalization was further complicated by aspiration pneumonia which is resolving but on 09/20/2017 patient had positive blood cultures for Candida.  Consult ordered for goals of care.   Clinical Assessment and Goals of Care: Met with patient, chart reviewed.  Family meeting held with sons Mateo Flow and Rami Waddle with Dr. Lake Bells from [facility] in attendance.  Patient is unable to make decisions for herself at this point due to acuity of illness.  Her healthcare proxy's are her sons Dymin Dingledine and Ernst Spell.  Dr. Lake Bells spent a great deal of time updating family as to the patient's clinical condition both positive clinical changes we have observed as well as further complications such as now positive blood cultures, pressor dependency.  Dr. Lake Bells shared with [place] his conversation with GI this morning that CT scan was highly concerning for high risk of future colonic perforation.  Additionally, he  talked about various avenues to address such as pulling all lines ,holding TPN, holding CRRT for 48 to 72 hours, in the setting of positive blood cultures, full comfort care, limitations to care.   Family is electing limitations to care.  They both seem to be verbalizing recognition that their mother is nearing end of life despite if pressors are escalated, additional antibiotics are required but are still in a place of wanting to do everything they can do for her    SUMMARY OF RECOMMENDATIONS   Patient remains a partial code with the following changes: No CPR, no defibrillation.  She currently has a trach with ventilator support; continue to wean as patient is able Continue pressors and may escalate or add additional  if needed Continue antibiotics with any modifications or escalation as recommended by critical care medicine or infectious disease Plan is to meet again on 08/25/2017 with palliative medicine and [facility] at 2 PM to readdress goals of care and patient's clinical status No further surgical procedures including pulling of existing lines Continue CRRT for now Family understands the TPN is now on hold   Code Status/Advance Care Planning:  Limited code    Symptom Management:   Pain: Continue with fentanyl continuous infusion.  Monitor and titrate  for effect  Palliative Prophylaxis:   Aspiration, Bowel Regimen, Delirium Protocol, Eye Care, Frequent Pain Assessment, Oral Care and Turn Reposition  Additional Recommendations (Limitations, Scope, Preferences):  No Radiation and No Surgical Procedures  Psycho-social/Spiritual:   Desire for further Chaplaincy support:no  Additional Recommendations: Grief/Bereavement Support  Prognosis:   Patient is an extremely high risk for acute cardiopulmonary failure.  She is currently pressor dependent in the setting of sepsis, prolonged hospital stay, multisystem failure with underlying end-stage renal disease, breast cancer.  Despite  continuing pressors, antibiotics, I fear that patient could pass anywhere from days to weeks  Discharge Planning: To Be Determined      Primary Diagnoses: Present on Admission: . Acute cholecystitis s/p lap cholecystectomy 08/23/2017 . Essential hypertension . GERD (gastroesophageal reflux disease) . Bile leak, delayed, s/p ERCP/stent 09/05/2017 . Fungemia   I have reviewed the medical record, interviewed the patient and family, and examined the patient. The following aspects are pertinent.  Past Medical History:  Diagnosis Date  . Arthritis   . Blood transfusion without reported diagnosis   . Cancer [facility])    patient states she had br ca 13 years ago  . ESRD (end stage renal disease) on dialysis ([place])   . Hypertension   . Renal disorder    patient on dialysis  . Wears glasses    Social History   Socioeconomic History  . Marital status: Widowed    Spouse name: Not on file  . Number of children: Not on file  . Years of education: Not on file  . Highest education level: Not on file  Occupational History  . Not on file  Social Needs  . Financial resource strain: Not on file  . Food insecurity:    Worry: Not on file    Inability: Not on file  . Transportation needs:    Medical: Not on file    Non-medical: Not on file  Tobacco Use  . Smoking status: Never Smoker  . Smokeless tobacco: Never Used  Substance and Sexual Activity  . Alcohol use: No  . Drug use: No  . Sexual activity: Not Currently  Lifestyle  . Physical activity:    Days per week: Patient refused    Minutes per session: Patient refused  . Stress: Rather much  Relationships  . Social connections:    Talks on phone: Not on file    Gets together: Not on file    Attends religious service: Not on file    Active member of club or organization: Not on file    Attends meetings of clubs or organizations: Not on file    Relationship status: Not on file  Other Topics Concern  . Not on file  Social History  Narrative  . Not on file   Family History  Problem Relation Age of Onset  . Cancer Maternal Aunt        breast   Scheduled Meds: . chlorhexidine gluconate (MEDLINE KIT)  15 mL Mouth Rinse BID  . Chlorhexidine Gluconate Cloth  6 each Topical Daily  . darbepoetin (ARANESP) injection - DIALYSIS  150 mcg Subcutaneous Q Mon-1800  . doxercalciferol  2 mcg Intravenous Q M,W,F-1800  . hydrocortisone sod succinate (SOLU-CORTEF) inj  50 mg Intravenous Q6H  . insulin aspart  0-15 Units Subcutaneous Q4H  . mouth rinse  15 mL Mouth Rinse QID  . pantoprazole (PROTONIX) IV  40 mg Intravenous Q12H  . sodium chloride flush  10-40 mL Intracatheter Q12H   Continuous  Infusions: . sodium chloride Stopped (09/20/17 1703)  . anidulafungin Stopped (09/21/17 1052)  . dextrose 50 mL/hr at 09/21/17 0600  . fentaNYL infusion INTRAVENOUS 125 mcg/hr (09/21/17 0747)  . norepinephrine (LEVOPHED) Adult infusion 3 mcg/min (09/21/17 1450)  . dialysis replacement fluid (prismasate) 400 mL/hr at 09/21/17 1023  . dialysis replacement fluid (prismasate) 200 mL/hr at 09/20/17 2150  . dialysate (PRISMASATE) 1,300 mL/hr at 09/21/17 1519  . sodium chloride 999 mL/hr at 09/15/17 0119  . vasopressin (PITRESSIN) infusion - *FOR SHOCK* Stopped (09/17/17 1500)   PRN Meds:.albuterol, alteplase, docusate, fentaNYL (SUBLIMAZE) injection, heparin, midazolam, sodium chloride Medications Prior to Admission:  Prior to Admission medications   Medication Sig Start Date End Date Taking? Authorizing Provider  amLODipine (NORVASC) 10 MG tablet Take 10 mg by mouth every evening.  03/31/13  Yes [provider]  calcium acetate (PHOSLO) 667 MG capsule Take 2 capsules (1,334 mg total) by mouth 3 (three) times daily with meals. 09/15/15  Yes Dana Allan I, MD  cloNIDine (CATAPRES) 0.1 MG tablet Take 0.1 mg by mouth every evening.  03/24/13  Yes [provider]  fluticasone (FLONASE) 50 MCG/ACT nasal spray Place 2 sprays  into both nostrils daily. 09/15/15  Yes Dana Allan I, MD  hydrALAZINE (APRESOLINE) 50 MG tablet Take 50 mg by mouth every evening.  04/14/13  Yes [provider]  multivitamin (RENA-VIT) TABS tablet Take 1 tablet by mouth at bedtime. 09/15/15  Yes Bonnell Public, MD  tamoxifen (NOLVADEX) 20 MG tablet Take 1 tablet (20 mg total) by mouth daily. Patient taking differently: Take 20 mg by mouth every evening.  02/12/17  Yes Causey, Charlestine Massed, NP  hydrOXYzine (ATARAX/VISTARIL) 25 MG tablet Take 1 tablet (25 mg total) by mouth every 8 (eight) hours as needed for itching. Patient not taking: Reported on 08/22/2017 09/15/15   Bonnell Public, MD   Allergies  Allergen Reactions  . Erythromycin Nausea And Vomiting  . Vicodin [Hydrocodone-Acetaminophen]     unknown   Review of Systems  Unable to perform ROS: Intubated    Physical Exam  Constitutional:  Acutely ill-appearing elderly female seen in ICU She does not track to my voice or open her eyes to voice  HENT:  Head: Normocephalic and atraumatic.  Cardiovascular:  Hypertensive.  Currently on pressors  Pulmonary/Chest:  Patient has a tracheostomy and continued ventilator support  Abdominal:  Drains in place; CT scan still revealing fluid collections  Genitourinary:  Genitourinary Comments: Patient on CRRT  Neurological:  Minimally responsive to light touch and voice  Skin: Skin is warm and dry.  Psychiatric:  No agitation but she is on sedation, fentanyl continuous infusion  Nursing note and vitals reviewed.   Vital Signs: BP (!) 108/44   Pulse 69   Temp (!) 94.3 F (34.6 C) (Oral)   Resp 14   Ht 5' 2"  (1.575 m)   Wt 63.4 kg (139 lb 12.4 oz)   SpO2 100%   BMI 25.56 kg/m  Pain Scale: CPOT POSS *See Group Information*: S-Acceptable,Sleep, easy to arouse Pain Score: Asleep   SpO2: SpO2: 100 % O2 Device:SpO2: 100 % O2 Flow Rate: .O2 Flow Rate (L/min): 2 L/min  IO: Intake/output summary:    Intake/Output Summary (Last 24 hours) at 09/21/2017 1521 Last data filed at 09/21/2017 1500 Gross per 24 hour  Intake 1907.43 ml  Output 1044 ml  Net 863.43 ml    LBM: Last BM Date: 09/20/17 Baseline Weight: Weight: 64 kg (141 lb 1.5 oz)  Most recent weight: Weight: 63.4 kg (139 lb 12.4 oz)     Palliative Assessment/Data:   Flowsheet Rows     Most Recent Value  Intake Tab  Referral Department  Critical care  Unit at Time of Referral  ICU  Palliative Care Primary Diagnosis  Sepsis/Infectious Disease  Date Notified  09/20/17  Palliative Care Type  New Palliative care  Reason for referral  Clarify Goals of Care  Date of Admission  09/16/2017  Date first seen by Palliative Care  09/21/17  # of days Palliative referral response time  1 Day(s)  # of days IP prior to Palliative referral  30  Clinical Assessment  Palliative Performance Scale Score  30%  Pain Max last 24 hours  Not able to report  Pain Min Last 24 hours  Not able to report  Dyspnea Max Last 24 Hours  Not able to report  Dyspnea Min Last 24 hours  Not able to report  Nausea Max Last 24 Hours  Not able to report  Nausea Min Last 24 Hours  Not able to report  Anxiety Max Last 24 Hours  Not able to report  Anxiety Min Last 24 Hours  Not able to report  Other Max Last 24 Hours  Not able to report  Psychosocial & Spiritual Assessment  Palliative Care Outcomes  Patient/Family meeting held?  Yes  Who was at the meeting?  sons, Milbert Coulter and [place] goals of care, Changed CPR status  Patient/Family wishes: Interventions discontinued/not started   Mechanical Ventilation  Palliative Care follow-up planned  Yes, Facility      Time In: 1330 Time Out: 1500 Time Total: 90 min Greater than 50%  of this time was spent counseling and coordinating care related to the above assessment and plan. Dr. Lake Bells  Signed by: Dory Horn, NP   Please contact Palliative Medicine Team phone at  [phone] for questions and concerns.  For individual provider: See Shea Evans

## 2017-09-21 NOTE — Progress Notes (Signed)
Patient ID: Pam Fuller, female   DOB: [birth year], 84 y.o.   MRN: [id number]          [facility] for Infectious Disease    Date of Admission:  09/05/2017           Day 5 anidulafungin  Ms. Weigand continues to do very poorly.  I have discussed her situation with Dr. Lake Bells.  She has developed fungemia in the setting of recent acute cholecystitis requiring laparoscopic cholecystectomy complicated by bile leak and multiple intra-abdominal fluid collections.  She has been treated for healthcare associated pneumonia.  Two of 2 blood cultures on 09/17/2017 have grown yeast which has not been speciated yet.  Repeat blood cultures were obtained this morning.  Her central lines cannot be safely removed or changed now because of her critical illness.  A family meeting is scheduled for this afternoon to discuss goals of care.  I will continue anidulafungin for now.         Michel Bickers, MD [facility] [phone] pager   [phone] cell 09/21/2017, 12:10 PM

## 2017-09-21 NOTE — Progress Notes (Signed)
**Note Pam-Identified via Obfuscation** PULMONARY / CRITICAL CARE MEDICINE   Name: Pam Fuller MRN: [id number] DOB: Aug 07, 1932    ADMISSION DATE:  09/13/2017 CONSULTATION DATE:  4/11  REFERRING MD:  Pam Fuller  CHIEF COMPLAINT:  Respiratory failure  HISTORY OF PRESENT ILLNESS:   84 y/o female with a prolonged hospitalization post lap chole who had a bile leak, multiple abdominal fluid collections, aspiration pneumonia, respiratory failure and ERSD on CVVHD.  SUBJECTIVE:  WBC up Did not have complete family quorum on 5/3 for family conversation  VITAL SIGNS: BP 113/61 (BP Location: Right Arm)   Pulse (!) 46   Temp (!) 97.4 F (36.3 C) (Oral)   Resp 16   Ht 5\' 2"  (1.575 m)   Wt 139 lb 12.4 oz (63.4 kg)   SpO2 100%   BMI 25.56 kg/m   HEMODYNAMICS:    VENTILATOR SETTINGS: Vent Mode: PRVC FiO2 (%):  [30 %] 30 % Set Rate:  [14 bmp] 14 bmp Vt Set:  [400 mL] 400 mL PEEP:  [5 cmH20] 5 cmH20 Pressure Support:  [10 cmH20] 10 cmH20 Plateau Pressure:  [14 cmH20] 14 cmH20  INTAKE / OUTPUT: I/O last 3 completed shifts: In: 3041.1 [I.V.:2906.1; Other:5; IV Piggyback:130] Out: 4481 [Emesis/NG output:275; Drains:120; EHUDJ:4970]  PHYSICAL EXAMINATION:   General:  In bed on vent HENT: Facial wounds noted, Trach in place PULM: CTA B, vent supported breathing CV: RRR, no mgr GI: minimal bowel sounds, two drains stable MSK: normal bulk and tone Neuro: eyes open, minimal response to external stimuli   LABS:  BMET Recent Labs  Lab 09/20/17 0417 09/20/17 1645 09/21/17 0409  NA 137 135 134*  K 4.4 4.1 4.2  CL 104 102 101  CO2 26 25 26   BUN 42* 40* 31*  CREATININE 0.67 0.62 0.58  GLUCOSE 174* 255* 135*    Electrolytes Recent Labs  Lab 09/19/17 0355  09/20/17 0417 09/20/17 1645 09/21/17 0409  CALCIUM 9.2   < > 9.1 8.9 9.1  MG 2.4  --  2.2  --  2.2  PHOS 3.3   < > 3.7 3.7 3.6   < > = values in this interval not displayed.    CBC Recent Labs  Lab 09/19/17 0355 09/20/17 0417 09/21/17 0409  WBC  15.5* 16.9* 19.8*  HGB 9.3* 9.5* 9.7*  HCT 29.7* 29.8* 29.8*  PLT 40* 40* 50*    Coag's Recent Labs  Lab 09/18/17 0446 09/19/17 0355 09/20/17 0417  INR 2.12 1.90 1.88    Sepsis Markers Recent Labs  Lab 09/17/17 0905 09/18/17 0446 09/19/17 0355  PROCALCITON 4.28 4.10 3.64    ABG No results for input(s): PHART, PCO2ART, PO2ART in the last 168 hours.  Liver Enzymes Recent Labs  Lab 09/19/17 0355  09/20/17 0417 09/20/17 1645 09/21/17 0409  AST 164*  --  245*  --  226*  ALT 128*  --  190*  --  197*  ALKPHOS 345*  --  358*  --  351*  BILITOT 24.2*  --  24.4*  --  23.8*  ALBUMIN 2.4*   < > 2.2* 2.0* 1.9*   < > = values in this interval not displayed.    Cardiac Enzymes Recent Labs  Lab 09/17/17 0905  TROPONINI 0.16*    Glucose Recent Labs  Lab 09/20/17 1213 09/20/17 1613 09/20/17 1934 09/20/17 2333 09/21/17 0318 09/21/17 0741  GLUCAP 196* 217* 210* 105* 109* 152*    Imaging No results found.   STUDIES:  CT abd/pelv 4/4 >> acute cholecystitis, gallstone  in GB neck, CBD 10 mm  CT Head: no acute changes, chronic small vessel ischemic changes  CT abd/pelvis 4/8 >> small-mod fluid in abd/pelvis  Hepatobiliary scan 4/9 >> bile leak  TTE 4/12 >> EF 65-70%, G1DD, narrow LVOT, mild TR, mild pHTN  U/S Abdomen 4/14 >> small fluid near cholecystectomy site  Hepatobiliary scan 4/14 >> Rt subhepatic fluid collection consistent w/ biloma; increased in size from prior  CT abd/pelvis w/ oral contrast 4/16 >> drainage catheter at inferior liver margin with decreased perihepatic fluid; low-density fluid at left subdiaphragmatic area, questionable colonic wall thickening throughout, multifocal airspace disease of lung bases  CT abdomen.pelvis 4/21: interval growth of large perihepatic and perisplenic fluid collection with multiple, new intraperitoneal fluid collections. Resolving multifocal pneumonia.   Echo 5/1 LVEF WNL, no other acute abnormalities,  mild AS, LA midly dilated  CULTURES: Tracheal aspirate 4/11 >> no growth Abd fluid 4/11 >> no growth 5 days Abd fluid 4/15 >> no growth 3 days Abd fluid 4/25 > no growth 4/30 blood culture > 1/4 yeast  ANTIBIOTICS: Zosyn 4/3 >> 4/7 Unasyn 4/11 Zosyn 4/12 - 4/19 Zosyn 4/21-4/29 09/17/2017 meropenem > 5/2 09/17/2017 eraxis > 5/2  Eraxis 5/3 >    LINES/TUBES: ETT 4/11>4/28 OGT 4/11>4/28 PIV RUE 4/9 RLQ Biliary Drain 4/11 HD Cath LIJ 4/12 >> 4/19 L femoral trialysis catheter 4/21> RIJ cath 4/23>  Tracheostomy 4/28>  SIGNIFICANT EVENTS: 4/4 - admitted w/ acute cholecystitis 4/6 - lap chole 4/9 - bile leak seen on hepatobiliary scan 4/11 - Aspirated during prep for ERCP, intubated and transferred to ICU 4/11 - Rt abd fluid drain placed by IR 4/12- Heparin gtt started for NSTEMI; held after OG bleeding and trop peak 4/13 - worsening liver enzyymes. ESRD - Anuric. CRRT started. On levophed 53mcg and fent 144mcg 4/14 - Hgb 6.0, transfused 2 units PRBCs 4/17 - DVT LUE - started bivalirudin for suspected HIT 4/18 - CVVH d/c'ed, return to intermittent HD.  4/19 - Restarted HD. HIT negative  4/20 - Bleeding from GB drain, OGT, and stools. Received 1u pRBC. Heparin gtt held. 4/21 - Multiple, new fluid collections found on CT abd/pelvis   4/23 - ERCP with cystic duct bile leak s/p stent placement, erosive gastritis, and CBD dilation  4/24 - Mid-abdomen perc drain placed by IR  4/26 - Coffee-ground emesis after restarting tube feeds  4/28 - Tracheostomy  4/29 - Significant bloody output from NGT with drop in Hgb 10->7 09/18/2017 CT of the abdomen > fluid collection 09/18/2017 palliative care consult placed 09/20/2017 yeast in blood culture    DISCUSSION:  83 y/o female with ESRD on HD, who is here with anemia, breast cancer here with acute cholecystitis.  S/p lap chole complicated by bile leak.  Prolonged ICU stay.    ASSESSMENT / PLAN:  PULMONARY A: Acute respiratory failure  with hypoxemia Aspiration pneumonia Tracheostomy in place P:   Pressure support as able Trach care per routine  CARDIOVASCULAR A:  Shock: multi-factorial, most recently related to adrenal insufficiency> improved NSTEMI (demand ischemia) Prolonged QTc LUE DVT P:  Tele  Continue stress dose steroids Continue levophed for MAP > 65  RENAL A:   ESRD on HD baseline AV fistula in LUE P:   Continue CVVHD Monitor/replace electrolytes  GASTROINTESTINAL A:   Severe protein calorie malnutrition Acute cholecystitis s/p lap chole 4/6 Bile leak s/p biliary stent via ERCP Biloma Erosive gastritis noted on ERCP 4/23 Hyper bilirubinemia likely due to intrahepatic  P:  Holding TPN Continue PPI  HEMATOLOGIC A:   Anemia, no bleeding  Thrombocytopenia > worsening L IJ and upper extremity clot P:  Monitor for bleeding Hold anticoagulation  INFECTIOUS A:   Peritonitis Aspiration pneumonia s/p treatment Biloma/intraperitoneal abscess Multiple drains placed in abscesses/biloma (intrahepatic) Fungemia P:   See comment yesterday Continue antifungals Holding off on more aggressive management until family around  ENDOCRINE A:   Intermittent hypoglycemia P:   Monitor glucose  NEUROLOGIC A:   Deconditioning Pain P:   RASS goal 0 to -1 PAD protocol for continuous fentanyl   FAMILY  - Updates: 5/3 son Pam Fuller wants to have goals of care conversation but his brother Pam Fuller didn't show up for it.  They plan to come today.  Multiple friends of family who know patient have encouraged Pam Fuller to withdraw care which is appropriate.  Will plan to meet with them today if they show up.  We are at a point where I feel more aggressive care is not medically beneficial because her likelihood of survival is low.  My cc time 35 minutes  Roselie Awkward, MD [facility] Pager: [phone] Cell: [phone] After 3pm or if no response, call [phone]   09/21/2017, 8:27 AM

## 2017-09-21 NOTE — Progress Notes (Signed)
Pam Fuller Progress Note  Subjective: no changes overnight  Vitals:   09/21/17 1100 09/21/17 1141 09/21/17 1148 09/21/17 1200  BP: (!) 100/48   (!) 106/49  Pulse: 64   65  Resp: 14   15  Temp:  (!) 94.3 F (34.6 C)    TempSrc:  Oral    SpO2: 100%  98% 100%  Weight:      Height:        Inpatient medications: . chlorhexidine gluconate (MEDLINE KIT)  15 mL Mouth Rinse BID  . Chlorhexidine Gluconate Cloth  6 each Topical Daily  . darbepoetin (ARANESP) injection - DIALYSIS  150 mcg Subcutaneous Q Mon-1800  . doxercalciferol  2 mcg Intravenous Q M,W,F-1800  . hydrocortisone sod succinate (SOLU-CORTEF) inj  50 mg Intravenous Q6H  . insulin aspart  0-15 Units Subcutaneous Q4H  . mouth rinse  15 mL Mouth Rinse QID  . pantoprazole (PROTONIX) IV  40 mg Intravenous Q12H  . sodium chloride flush  10-40 mL Intracatheter Q12H   . sodium chloride Stopped (09/20/17 1703)  . anidulafungin Stopped (09/21/17 1052)  . dextrose 50 mL/hr at 09/21/17 0600  . fentaNYL infusion INTRAVENOUS 125 mcg/hr (09/21/17 0747)  . norepinephrine (LEVOPHED) Adult infusion 2 mcg/min (09/21/17 1150)  . dialysis replacement fluid (prismasate) 400 mL/hr at 09/21/17 1023  . dialysis replacement fluid (prismasate) 200 mL/hr at 09/20/17 2150  . dialysate (PRISMASATE) 1,300 mL/hr at 09/21/17 1128  . sodium chloride 999 mL/hr at 09/15/17 0119  . vasopressin (PITRESSIN) infusion - *FOR SHOCK* Stopped (09/17/17 1500)   albuterol, alteplase, docusate, fentaNYL (SUBLIMAZE) injection, heparin, midazolam, sodium chloride  Exam: On vent, not responsive trach in place No jvd, +scleral icterus Chest coarse bilat RRR no mrg abd soft ntnd  abd drains in place Ext 1-2+ edema upper extremities nf not following commands eyes open lua avg w bruit   [facility]h 87mn  61.5kg   2/2.25 bath p4  AVG  Heparin 4000 - hect 2 ug q hd iv - mircera 30 ug every 2 wks iv at dialysis       Impression: 1  cholecystitis: sp lap chole w bile leak 2 jaundice w liver failure / underlying cirrhosis 3 biloma/intraperitoneal abscesses - drains placed 4 +yeast in blood culture 5 esrd - cont crrt as tolerated 6 vdrf - sp trach 7 anemia ckd/ abl - on darbe 150 ug weekly, hb 9- 10 8 gi bleed gastritis seen at [facility], sp multiple prbc's and ffp / plts 9 poor prognosis - pall care meeting w/ family today   Plan - as above   RKelly SplinterMD CKentuckyKidney Fuller pager [phone]  09/21/2017, 12:25 PM   Recent Labs  Lab 09/20/17 0417 09/20/17 1645 09/21/17 0409  NA 137 135 134*  K 4.4 4.1 4.2  CL 104 102 101  CO2 26 25 26   GLUCOSE 174* 255* 135*  BUN 42* 40* 31*  CREATININE 0.67 0.62 0.58  CALCIUM 9.1 8.9 9.1  PHOS 3.7 3.7 3.6   Recent Labs  Lab 09/19/17 0355  09/20/17 0417 09/20/17 1645 09/21/17 0409  AST 164*  --  245*  --  226*  ALT 128*  --  190*  --  197*  ALKPHOS 345*  --  358*  --  351*  BILITOT 24.2*  --  24.4*  --  23.8*  PROT 6.0*  --  6.1*  --  6.2*  ALBUMIN 2.4*   < > 2.2* 2.0* 1.9*   < > =  values in this interval not displayed.   Recent Labs  Lab 09/16/17 0510  09/19/17 0355 09/20/17 0417 09/21/17 0409  WBC 16.5*   < > 15.5* 16.9* 19.8*  NEUTROABS 14.4*  --  14.4*  --   --   HGB 10.1*   < > 9.3* 9.5* 9.7*  HCT 31.3*   < > 29.7* 29.8* 29.8*  MCV 97.2   < > 99.7 100.0 100.0  PLT 33*   < > 40* 40* 50*   < > = values in this interval not displayed.   Iron/TIBC/Ferritin/ %Sat    Component Value Date/Time   IRON 84 04/15/2010 1030   TIBC 178 (L) 04/15/2010 1030   FERRITIN 784 (H) 04/15/2010 1030   IRONPCTSAT 47 04/15/2010 1030

## 2017-09-21 NOTE — Progress Notes (Signed)
[facility]  [address] [place], [place] [address] Phone: [phone]  FAX: [phone]      Pam Fuller [id number] 17-Nov-1932  CARE TEAM:  PCP: No primary care provider on file.  Outpatient Care Team: Patient Care Team: Belva Crome, MD as PCP - Cardiology (Cardiology)  Inpatient Treatment Team: Treatment Team: Attending Provider: Juanito Doom, MD; Consulting Physician: Edison Pace, Md, MD; Consulting Physician: Roney Jaffe, MD; Registered Nurse: Kennith Center, RN; Rounding Team: Dorthy Cooler Radiology, MD; Rounding Team: Pccm, Md, MD; Registered Nurse: Ernestene Kiel, RN; Technician: Mars, [place], [place]; Physician Assistant: Alric Seton, PA-C; Registered Nurse: Illene Regulus, RN; Registered Nurse: Owens Shark, RN; Registered Nurse: Elveria Royals, RN; Nurse Practitioner: Philis Pique, NP; Registered Nurse: Glennie Isle, RN   Problem List:   Principal Problem:   Bile leak, delayed, s/p ERCP/stent 08/28/2017 Active Problems:   ESRD on hemodialysis [facility])   Essential hypertension   GERD (gastroesophageal reflux disease)   Acute cholecystitis s/p lap cholecystectomy 09/08/2017   Acute respiratory failure ([place])   Aspiration into airway   Protein-calorie malnutrition, severe ([place])   Septic shock ([place])   NSTEMI (non-ST elevated myocardial infarction) ([place])   Shock liver   Coagulopathy ([place])   Bile peritonitis ([place])   UGIB (upper gastrointestinal bleed)   Pressure injury of skin   11 Days Post-Op  09/07/2017 - 09/14/2017  Procedure(s): ENDOSCOPIC RETROGRADE CHOLANGIOPANCREATOGRAPHY (ERCP)   Assessment/Plan:  [place] given MSOF, FTT  Septic shock Acute cholecystitis s/p lap chole4/6/19 by Dr. Redmond Pulling - CT scan 4/8 w/ small amount fluid around liver and layering in pelvis ; HIDA 4/9 positive for bile leak - CT 4/21 with multiple fluid collections -additional  drain placed 4/24; Cx negative -ERCP 4/23 w stent placement (Dr Fuller Plan); erosive gastritis noted - continue PPI - continue drains -consider perc drain of R anterior fluid collection if staying aggressive  Shock liver Hyperbilirubinemia - drains stable, suspect 2/2 prolonged TNA/cholestasis.   Leukocytosis- worsening - consider placing drain Severe protein-calorie malnutrition- TNA   VDRF secondary to aspiration/PNA-PNA resolved andCCMfollowing - appreciate assistance.  - s/p trach 4/28on full vent support.  weaning  NSTEMI4/12 LUE DVT4/17  ABL anemia- hgb/hct9.3/29.7; s/p 1 unit PRBC and 1 unit platelets 4/30 Thrombocytopenia -40,000   ID -zosyn 4/4-4/7,Unasyn 4/11-4/12, Zosyn 4/12 -4/19, Zosyn 4/21-4/29, Eraxis 4/30 >>  FEN -NPO/NGT to LIWS VTE -SCDs Foley -none, anuric Follow up -Dr. Redmond Pulling  Dispo: Prognosis grim.  Continue NPO/NGT  TNA Worse off IV ABx - consider restart & drain last undrainned collection if family wishes to be aggressive still Agree w Pall eval & meeting to discuss goals of care Continue abdominal drains. No acute surgical intervention indicated.       Adin Hector, M.D., F.A.C.S. Gastrointestinal and Minimally Invasive Surgery [facility] [address] [place], [place] [address] [phone] Main / Paging   09/21/2017    Subjective: (Chief complaint)  Intubated/sedated ICU RN in room Dr [place] Bells outside door  Objective:  Vital signs:  Vitals:   09/21/17 0500 09/21/17 0600 09/21/17 0700 09/21/17 0733  BP: (!) 108/51 (!) 104/53 (!) 106/46   Pulse: 73 80 74   Resp: 12 18 16    Temp:    (!) 97.4 F (36.3 C)  TempSrc:    Oral  SpO2: 100% 100% 100%   Weight:      Height:  Last BM Date: 09/20/17  Intake/Output   Yesterday:  05/03 0701 - 05/04 0700 In: 2070.1 [I.V.:1935.1; IV Piggyback:130] Out: 1067 [Emesis/NG output:200; Drains:90] This shift:  No  intake/output data recorded.  Bowel function:  Flatus: YES  BM:  YES  Drain: Bilious   Physical Exam:  General: Pt intubated/sedated.  Not interactive.  Was following some commands w ICU RN earlier Eyes: PERRL - weak  Sclera bilious Neuro: w/o focal sensory/motor deficits. Lymph: No head/neck/groin lymphadenopathy Psych:  No delerium/psychosis/paranoia HENT: Normocephalic, Mucus membranes moist.  No thrush Neck: Supple, No tracheal deviation.  Trach clean - no signif secreations Chest: No chest wall pain w good excursion CV:  Pulses intact.  Regular rhythm MS: Normal AROM mjr joints.  No obvious deformity  Abdomen: Somewhat firm.  Mildy distended.  Nontender.  No evidence of peritonitis.  No incarcerated hernias.  Bilious perc drains  Ext:  No deformity.  No mjr edema.  No cyanosis Skin: No petechiae / purpura  Results:   Labs: Results for orders placed or performed during the hospital encounter of 09/11/2017 (from the past 48 hour(s))  Glucose, capillary     Status: Abnormal   Collection Time: 09/19/17 11:09 AM  Result Value Ref Range   Glucose-Capillary 148 (H) 65 - 99 mg/dL   Comment 1 Notify RN   Renal function panel (daily at 1600)     Status: Abnormal   Collection Time: 09/19/17  2:53 PM  Result Value Ref Range   Sodium 137 135 - 145 mmol/L   Potassium 3.8 3.5 - 5.1 mmol/L   Chloride 107 101 - 111 mmol/L   CO2 22 22 - 32 mmol/L   Glucose, Bld 197 (H) 65 - 99 mg/dL   BUN 36 (H) 6 - 20 mg/dL   Creatinine, Ser 0.63 0.44 - 1.00 mg/dL   Calcium 8.1 (L) 8.9 - 10.3 mg/dL   Phosphorus 3.4 2.5 - 4.6 mg/dL   Albumin 2.0 (L) 3.5 - 5.0 g/dL   GFR calc non Af Amer >60 >60 mL/min   GFR calc Af Amer >60 >60 mL/min    Comment: (NOTE) The eGFR has been calculated using the CKD EPI equation. This calculation has not been validated in all clinical situations. eGFR's persistently <60 mL/min signify possible Chronic Kidney Disease.    Anion gap 8 5 - 15    Comment: Performed  at [facility] [address] [place], [place] [address]  Glucose, capillary     Status: Abnormal   Collection Time: 09/19/17  4:22 PM  Result Value Ref Range   Glucose-Capillary 199 (H) 65 - 99 mg/dL   Comment 1 Notify RN   Glucose, capillary     Status: Abnormal   Collection Time: 09/19/17  7:50 PM  Result Value Ref Range   Glucose-Capillary 165 (H) 65 - 99 mg/dL   Comment 1 Capillary Specimen    Comment 2 Notify RN   Glucose, capillary     Status: Abnormal   Collection Time: 09/19/17 11:39 PM  Result Value Ref Range   Glucose-Capillary 146 (H) 65 - 99 mg/dL   Comment 1 Capillary Specimen    Comment 2 Notify RN   Glucose, capillary     Status: Abnormal   Collection Time: 09/20/17  3:30 AM  Result Value Ref Range   Glucose-Capillary 150 (H) 65 - 99 mg/dL   Comment 1 Capillary Specimen    Comment 2 Notify RN   Magnesium     Status: None  Collection Time: 09/20/17  4:17 AM  Result Value Ref Range   Magnesium 2.2 1.7 - 2.4 mg/dL    Comment: Performed at [facility], [place] [address], [place], [place] [address]  Comprehensive metabolic panel     Status: Abnormal   Collection Time: 09/20/17  4:17 AM  Result Value Ref Range   Sodium 137 135 - 145 mmol/L   Potassium 4.4 3.5 - 5.1 mmol/L   Chloride 104 101 - 111 mmol/L   CO2 26 22 - 32 mmol/L   Glucose, Bld 174 (H) 65 - 99 mg/dL   BUN 42 (H) 6 - 20 mg/dL   Creatinine, Ser 0.67 0.44 - 1.00 mg/dL   Calcium 9.1 8.9 - 10.3 mg/dL   Total Protein 6.1 (L) 6.5 - 8.1 g/dL   Albumin 2.2 (L) 3.5 - 5.0 g/dL   AST 245 (H) 15 - 41 U/L   ALT 190 (H) 14 - 54 U/L   Alkaline Phosphatase 358 (H) 38 - 126 U/L   Total Bilirubin 24.4 (HH) 0.3 - 1.2 mg/dL    Comment: CRITICAL VALUE NOTED.  VALUE IS CONSISTENT WITH PREVIOUSLY REPORTED AND CALLED VALUE.   GFR calc non Af Amer >60 >60 mL/min   GFR calc Af Amer >60 >60 mL/min    Comment: (NOTE) The eGFR has been calculated using the CKD EPI equation. This calculation has not been  validated in all clinical situations. eGFR's persistently <60 mL/min signify possible Chronic Kidney Disease.    Anion gap 7 5 - 15    Comment: Performed at [facility] [address], [place], [place] [address]  Protime-INR     Status: Abnormal   Collection Time: 09/20/17  4:17 AM  Result Value Ref Range   Prothrombin Time 21.5 (H) 11.4 - 15.2 seconds   INR 1.88     Comment: Performed at [facility] [address] [place], [place] [address]  Phosphorus     Status: None   Collection Time: 09/20/17  4:17 AM  Result Value Ref Range   Phosphorus 3.7 2.5 - 4.6 mg/dL    Comment: Performed at [facility] [address], [place], [place] [address]  CBC     Status: Abnormal   Collection Time: 09/20/17  4:17 AM  Result Value Ref Range   WBC 16.9 (H) 4.0 - 10.5 K/uL   RBC 2.98 (L) 3.87 - 5.11 MIL/uL   Hemoglobin 9.5 (L) 12.0 - 15.0 g/dL   HCT 29.8 (L) 36.0 - 46.0 %   MCV 100.0 78.0 - 100.0 fL   MCH 31.9 26.0 - 34.0 pg   MCHC 31.9 30.0 - 36.0 g/dL   RDW 27.0 (H) 11.5 - 15.5 %   Platelets 40 (L) 150 - 400 K/uL    Comment: PLATELET COUNT CONFIRMED BY SMEAR Performed at [facility] [address] [place], [place] [address]   Glucose, capillary     Status: Abnormal   Collection Time: 09/20/17  7:10 AM  Result Value Ref Range   Glucose-Capillary 147 (H) 65 - 99 mg/dL   Comment 1 Capillary Specimen    Comment 2 Notify RN   Glucose, capillary     Status: Abnormal   Collection Time: 09/20/17 12:13 PM  Result Value Ref Range   Glucose-Capillary 196 (H) 65 - 99 mg/dL   Comment 1 Capillary Specimen    Comment 2 Notify RN   Glucose, capillary     Status: Abnormal  Collection Time: 09/20/17  4:13 PM  Result Value Ref Range   Glucose-Capillary 217 (H) 65 - 99 mg/dL   Comment 1 Capillary Specimen    Comment 2 Notify RN   Renal function panel (daily at 1600)     Status: Abnormal   Collection Time: 09/20/17  4:45 PM  Result Value Ref Range   Sodium 135 135  - 145 mmol/L   Potassium 4.1 3.5 - 5.1 mmol/L   Chloride 102 101 - 111 mmol/L   CO2 25 22 - 32 mmol/L   Glucose, Bld 255 (H) 65 - 99 mg/dL   BUN 40 (H) 6 - 20 mg/dL   Creatinine, Ser 0.62 0.44 - 1.00 mg/dL   Calcium 8.9 8.9 - 10.3 mg/dL   Phosphorus 3.7 2.5 - 4.6 mg/dL   Albumin 2.0 (L) 3.5 - 5.0 g/dL   GFR calc non Af Amer >60 >60 mL/min   GFR calc Af Amer >60 >60 mL/min    Comment: (NOTE) The eGFR has been calculated using the CKD EPI equation. This calculation has not been validated in all clinical situations. eGFR's persistently <60 mL/min signify possible Chronic Kidney Disease.    Anion gap 8 5 - 15    Comment: Performed at [facility] [address] [place], [place] [address]  Glucose, capillary     Status: Abnormal   Collection Time: 09/20/17  7:34 PM  Result Value Ref Range   Glucose-Capillary 210 (H) 65 - 99 mg/dL   Comment 1 Capillary Specimen    Comment 2 Notify RN   Glucose, capillary     Status: Abnormal   Collection Time: 09/20/17 11:33 PM  Result Value Ref Range   Glucose-Capillary 105 (H) 65 - 99 mg/dL   Comment 1 Capillary Specimen    Comment 2 Notify RN   Glucose, capillary     Status: Abnormal   Collection Time: 09/21/17  3:18 AM  Result Value Ref Range   Glucose-Capillary 109 (H) 65 - 99 mg/dL   Comment 1 Capillary Specimen    Comment 2 Notify RN   Magnesium     Status: None   Collection Time: 09/21/17  4:09 AM  Result Value Ref Range   Magnesium 2.2 1.7 - 2.4 mg/dL    Comment: Performed at [facility], [place] [address], [place], [place] [address]  Comprehensive metabolic panel     Status: Abnormal   Collection Time: 09/21/17  4:09 AM  Result Value Ref Range   Sodium 134 (L) 135 - 145 mmol/L   Potassium 4.2 3.5 - 5.1 mmol/L   Chloride 101 101 - 111 mmol/L   CO2 26 22 - 32 mmol/L   Glucose, Bld 135 (H) 65 - 99 mg/dL   BUN 31 (H) 6 - 20 mg/dL   Creatinine, Ser 0.58 0.44 - 1.00 mg/dL   Calcium 9.1 8.9 - 10.3 mg/dL   Total Protein  6.2 (L) 6.5 - 8.1 g/dL   Albumin 1.9 (L) 3.5 - 5.0 g/dL   AST 226 (H) 15 - 41 U/L   ALT 197 (H) 14 - 54 U/L   Alkaline Phosphatase 351 (H) 38 - 126 U/L   Total Bilirubin 23.8 (HH) 0.3 - 1.2 mg/dL    Comment: CRITICAL RESULT CALLED TO, READ BACK BY AND VERIFIED WITH: CLARK,A RN 09/21/2017 0505 JORDANS    GFR calc non Af Amer >60 >60 mL/min   GFR calc Af Amer >60 >60 mL/min    Comment: (NOTE) The  eGFR has been calculated using the CKD EPI equation. This calculation has not been validated in all clinical situations. eGFR's persistently <60 mL/min signify possible Chronic Kidney Disease.    Anion gap 7 5 - 15    Comment: Performed at [facility] [address] [place], [place] [address]  Phosphorus     Status: None   Collection Time: 09/21/17  4:09 AM  Result Value Ref Range   Phosphorus 3.6 2.5 - 4.6 mg/dL    Comment: Performed at [facility] [address] [place], [place] [address]  CBC     Status: Abnormal   Collection Time: 09/21/17  4:09 AM  Result Value Ref Range   WBC 19.8 (H) 4.0 - 10.5 K/uL   RBC 2.98 (L) 3.87 - 5.11 MIL/uL   Hemoglobin 9.7 (L) 12.0 - 15.0 g/dL   HCT 29.8 (L) 36.0 - 46.0 %   MCV 100.0 78.0 - 100.0 fL   MCH 32.6 26.0 - 34.0 pg   MCHC 32.6 30.0 - 36.0 g/dL   RDW 27.5 (H) 11.5 - 15.5 %   Platelets 50 (L) 150 - 400 K/uL    Comment: CONSISTENT WITH PREVIOUS RESULT Performed at [facility], [place] [address] [place], [place] [address]   Glucose, capillary     Status: Abnormal   Collection Time: 09/21/17  7:41 AM  Result Value Ref Range   Glucose-Capillary 152 (H) 65 - 99 mg/dL   Comment 1 Capillary Specimen    Comment 2 Notify RN     Imaging / Studies: No results found.  Medications / Allergies: per chart  Antibiotics: Anti-infectives (From admission, onward)   Start     Dose/Rate Route Frequency Ordered Stop   09/20/17 1000  anidulafungin (ERAXIS) 100 mg in sodium chloride 0.9 % 100 mL IVPB     100 mg 78 mL/hr over 100  Minutes Intravenous Every 24 hours 09/20/17 0845     09/18/17 1100  anidulafungin (ERAXIS) 100 mg in sodium chloride 0.9 % 100 mL IVPB  Status:  Discontinued     100 mg 78 mL/hr over 100 Minutes Intravenous Every 24 hours 09/17/17 0858 09/19/17 1130   09/17/17 1400  meropenem (MERREM) 1 g in sodium chloride 0.9 % 100 mL IVPB  Status:  Discontinued     1 g 200 mL/hr over 30 Minutes Intravenous Every 12 hours 09/17/17 1344 09/19/17 1130   09/17/17 1100  anidulafungin (ERAXIS) 200 mg in sodium chloride 0.9 % 200 mL IVPB     200 mg 78 mL/hr over 200 Minutes Intravenous  Once 09/17/17 0858 09/17/17 1329   09/08/17 2000  piperacillin-tazobactam (ZOSYN) IVPB 3.375 g  Status:  Discontinued     3.375 g 100 mL/hr over 30 Minutes Intravenous Every 6 hours 09/08/17 1946 09/16/17 1151   09/05/17 2000  piperacillin-tazobactam (ZOSYN) IVPB 3.375 g     3.375 g 12.5 mL/hr over 240 Minutes Intravenous Every 12 hours 09/05/17 1126 09/07/17 0154   08/31/17 0800  piperacillin-tazobactam (ZOSYN) IVPB 3.375 g  Status:  Discontinued     3.375 g 100 mL/hr over 30 Minutes Intravenous Every 6 hours 08/31/17 0739 09/05/17 1126   08/30/17 2000  Ampicillin-Sulbactam (UNASYN) 3 g in sodium chloride 0.9 % 100 mL IVPB  Status:  Discontinued     3 g 200 mL/hr over 30 Minutes Intravenous Every 24 hours 08/22/2017 0953 08/30/17 0945   08/30/17 1230  piperacillin-tazobactam (ZOSYN) IVPB 3.375 g  Status:  Discontinued  3.375 g 12.5 mL/hr over 240 Minutes Intravenous Every 12 hours 08/30/17 1134 08/31/17 0738   08/26/2017 1030  Ampicillin-Sulbactam (UNASYN) 3 g in sodium chloride 0.9 % 100 mL IVPB     3 g 200 mL/hr over 30 Minutes Intravenous  Once 09/14/2017 0953 08/19/2017 1246   08/26/2017 0000  ampicillin-sulbactam (UNASYN) 1.5 g in sodium chloride 0.9 % 100 mL IVPB  Status:  Discontinued     1.5 g 200 mL/hr over 30 Minutes Intravenous Once 08/28/17 1045 08/20/2017 0928   08/27/2017 2200  piperacillin-tazobactam (ZOSYN) IVPB 3.375  g    Note to Pharmacy:  Zosyn 3.375 g IV q12h in ESRD on HD   3.375 g 12.5 mL/hr over 240 Minutes Intravenous Every 12 hours 08/23/2017 1155 08/25/17 1335   08/22/17 1000  piperacillin-tazobactam (ZOSYN) IVPB 3.375 g  Status:  Discontinued    Note to Pharmacy:  Zosyn 3.375 g IV q12h in ESRD on HD   3.375 g 12.5 mL/hr over 240 Minutes Intravenous Every 12 hours 08/22/17 0128 09/04/2017 1155   08/22/17 0045  piperacillin-tazobactam (ZOSYN) IVPB 3.375 g     3.375 g 12.5 mL/hr over 240 Minutes Intravenous  Once 08/22/17 0038 08/22/17 0145        Note: Portions of this report may have been transcribed using voice recognition software. Every effort was made to ensure accuracy; however, inadvertent computerized transcription errors may be present.   Any transcriptional errors that result from this process are unintentional.     Adin Hector, M.D., F.A.C.S. Gastrointestinal and Minimally Invasive Surgery [facility] [address] [place], [place] [address] [phone] Main / Paging   09/21/2017

## 2017-09-21 NOTE — Progress Notes (Signed)
[facility] P[facility]M  Lengthy conversation with the sons Mateo Flow and Milbert Coulter today about her condition.  They understand the severity of her illness well and her current severe problems.  We discussed that with her advanced age and multi-organ failure (lung, kidney, heart, liver, gut) it is unlikely to improve and she will most likely die from this illness.  They are reluctant to withdraw care but would prefer to continue our current level of care and not perform additional procedures.  They don't want her to go through CPR.  OK to continue vasopressors, vent, CVVHD for now.  Roselie Awkward, MD [facility] P[facility]M Pager: [phone] Cell: [phone] After 3pm or if no response, call [phone]

## 2017-09-22 DIAGNOSIS — K625 Hemorrhage of anus and rectum: Secondary | ICD-10-CM

## 2017-09-22 DIAGNOSIS — Z885 Allergy status to narcotic agent status: Secondary | ICD-10-CM

## 2017-09-22 DIAGNOSIS — E440 Moderate protein-calorie malnutrition: Secondary | ICD-10-CM

## 2017-09-22 DIAGNOSIS — Z881 Allergy status to other antibiotic agents status: Secondary | ICD-10-CM

## 2017-09-22 DIAGNOSIS — Z978 Presence of other specified devices: Secondary | ICD-10-CM

## 2017-09-22 DIAGNOSIS — Z9911 Dependence on respirator [ventilator] status: Secondary | ICD-10-CM

## 2017-09-22 DIAGNOSIS — B3789 Other sites of candidiasis: Secondary | ICD-10-CM

## 2017-09-22 DIAGNOSIS — R945 Abnormal results of liver function studies: Secondary | ICD-10-CM

## 2017-09-22 DIAGNOSIS — Z930 Tracheostomy status: Secondary | ICD-10-CM

## 2017-09-22 DIAGNOSIS — L02211 Cutaneous abscess of abdominal wall: Secondary | ICD-10-CM

## 2017-09-22 LAB — RENAL FUNCTION PANEL
ANION GAP: 12 (ref 5–15)
Albumin: 1.7 g/dL — ABNORMAL LOW (ref 3.5–5.0)
BUN: 20 mg/dL (ref 6–20)
CO2: 21 mmol/L — ABNORMAL LOW (ref 22–32)
Calcium: 8.8 mg/dL — ABNORMAL LOW (ref 8.9–10.3)
Chloride: 101 mmol/L (ref 101–111)
Creatinine, Ser: 0.64 mg/dL (ref 0.44–1.00)
GFR calc non Af Amer: >60 mL/min (ref >60)
GLUCOSE: 193 mg/dL — AB (ref 65–99)
PHOSPHORUS: 4.4 mg/dL (ref 2.5–4.6)
POTASSIUM: 4.5 mmol/L (ref 3.5–5.1)
Sodium: 134 mmol/L — ABNORMAL LOW (ref 135–145)

## 2017-09-22 LAB — COMPREHENSIVE METABOLIC PANEL
ALT: 249 U/L — AB (ref 14–54)
AST: 266 U/L — AB (ref 15–41)
Albumin: 1.8 g/dL — ABNORMAL LOW (ref 3.5–5.0)
Alkaline Phosphatase: 378 U/L — ABNORMAL HIGH (ref 38–126)
Anion gap: 8 (ref 5–15)
BUN: 23 mg/dL — AB (ref 6–20)
CHLORIDE: 100 mmol/L — AB (ref 101–111)
CO2: 24 mmol/L (ref 22–32)
CREATININE: 0.64 mg/dL (ref 0.44–1.00)
Calcium: 8.6 mg/dL — ABNORMAL LOW (ref 8.9–10.3)
GFR calc Af Amer: >60 mL/min (ref >60)
GFR calc non Af Amer: >60 mL/min (ref >60)
Glucose, Bld: 133 mg/dL — ABNORMAL HIGH (ref 65–99)
POTASSIUM: 4.3 mmol/L (ref 3.5–5.1)
SODIUM: 132 mmol/L — AB (ref 135–145)
Total Bilirubin: 24.2 mg/dL (ref 0.3–1.2)
Total Protein: 5.9 g/dL — ABNORMAL LOW (ref 6.5–8.1)

## 2017-09-22 LAB — MAGNESIUM: MAGNESIUM: 2.3 mg/dL (ref 1.7–2.4)

## 2017-09-22 LAB — GLUCOSE, CAPILLARY
GLUCOSE-CAPILLARY: 130 mg/dL — AB (ref 65–99)
GLUCOSE-CAPILLARY: 117 mg/dL — AB (ref 65–99)
GLUCOSE-CAPILLARY: 159 mg/dL — AB (ref 65–99)
GLUCOSE-CAPILLARY: 139 mg/dL — AB (ref 65–99)
Glucose-Capillary: 126 mg/dL — ABNORMAL HIGH (ref 65–99)
Glucose-Capillary: 176 mg/dL — ABNORMAL HIGH (ref 65–99)

## 2017-09-22 LAB — CULTURE, BLOOD (ROUTINE X 2): SPECIAL REQUESTS: ADEQUATE

## 2017-09-22 LAB — CBC
HEMATOCRIT: 29.9 % — AB (ref 36.0–46.0)
HEMOGLOBIN: 9.6 g/dL — AB (ref 12.0–15.0)
MCH: 31.8 pg (ref 26.0–34.0)
MCHC: 32.1 g/dL (ref 30.0–36.0)
MCV: 99 fL (ref 78.0–100.0)
Platelets: 66 10*3/uL — ABNORMAL LOW (ref 150–400)
RBC: 3.02 MIL/uL — AB (ref 3.87–5.11)
WBC: 21.1 10*3/uL — ABNORMAL HIGH (ref 4.0–10.5)

## 2017-09-22 LAB — PHOSPHORUS: Phosphorus: 3.8 mg/dL (ref 2.5–4.6)

## 2017-09-22 MED ORDER — VITAL HIGH PROTEIN PO LIQD
1000.0000 mL | ORAL | Status: DC
  Administered 2017-09-22: 1000 mL

## 2017-09-22 NOTE — Progress Notes (Signed)
CRITICAL VALUE ALERT  Critical Value:  Bilirubin 24.2   Date & Time Notied:  09/22/17 0508  Provider Notified: Borkowski Lacy MD   Orders Received/Actions taken: none

## 2017-09-22 NOTE — Progress Notes (Addendum)
Patient ID: Pam Fuller, female   DOB: 11-06-1932, 84 y.o.   MRN: [id number]         [facility]  Date of Admission:  08/19/2017           Day 6 anidulafungin ASSESSMENT: A discussion about goals of care and treatment plans was conducted yesterday with her 2 sons.  They want to continue medical therapy for now but avoid any further procedures.  Her positive blood culture grew Candida lusitaniae, a species that is normally sensitive to fluconazole and anidulafungin.  Her liver enzymes remain elevated so I will continue anidulafungin.  PLAN: 1. Continue anidulafungin  Principal Problem:   Bile leak, delayed, s/p ERCP/stent 08/27/2017 Active Problems:   Fungemia   Essential hypertension   GERD (gastroesophageal reflux disease)   Acute cholecystitis s/p lap cholecystectomy 08/28/2017   Acute respiratory failure (HCC)   Aspiration into airway   ESRD on hemodialysis (HCC)   Protein-calorie malnutrition, severe (HCC)   Septic shock (HCC)   NSTEMI (non-ST elevated myocardial infarction) ([place])   Shock liver   Coagulopathy (HCC)   Bile peritonitis (HCC)   UGIB (upper gastrointestinal bleed)   Pressure injury of skin   Abdominal wall abscess   Generalized abdominal pain   Goals of care, counseling/discussion   Palliative care by specialist   Malnutrition of moderate degree   Scheduled Meds: . chlorhexidine gluconate (MEDLINE KIT)  15 mL Mouth Rinse BID  . Chlorhexidine Gluconate Cloth  6 each Topical Daily  . darbepoetin (ARANESP) injection - DIALYSIS  150 mcg Subcutaneous Q Mon-1800  . doxercalciferol  2 mcg Intravenous Q M,W,F-1800  . feeding supplement (VITAL HIGH PROTEIN)  1,000 mL Per Tube Q24H  . hydrocortisone sod succinate (SOLU-CORTEF) inj  50 mg Intravenous Q6H  . insulin aspart  0-15 Units Subcutaneous Q4H  . mouth rinse  15 mL Mouth Rinse QID  . pantoprazole (PROTONIX) IV  40 mg Intravenous Q12H  . sodium chloride flush  10-40 mL  Intracatheter Q12H   Continuous Infusions: . sodium chloride Stopped (09/20/17 1703)  . anidulafungin Stopped (09/22/17 1059)  . dextrose 50 mL/hr at 09/22/17 1300  . fentaNYL infusion INTRAVENOUS 125 mcg/hr (09/22/17 1300)  . norepinephrine (LEVOPHED) Adult infusion 2.027 mcg/min (09/22/17 1200)  . dialysis replacement fluid (prismasate) 400 mL/hr at 09/22/17 1219  . dialysis replacement fluid (prismasate) 200 mL/hr at 09/21/17 2327  . dialysate (PRISMASATE) 1,300 mL/hr at 09/22/17 1059  . sodium chloride 999 mL/hr at 09/15/17 0119  . vasopressin (PITRESSIN) infusion - *FOR SHOCK* Stopped (09/17/17 1500)   PRN Meds:.albuterol, alteplase, docusate, fentaNYL (SUBLIMAZE) injection, heparin, midazolam, sodium chloride  Review of Systems: Review of Systems  Unable to perform ROS: Intubated    Allergies  Allergen Reactions  . Erythromycin Nausea And Vomiting  . Vicodin [Hydrocodone-Acetaminophen]     unknown    OBJECTIVE: Vitals:   09/22/17 1155 09/22/17 1200 09/22/17 1221 09/22/17 1300  BP:  (!) 106/51  (!) 99/48  Pulse:    65  Resp:  20  (!) 27  Temp:   97.6 F (36.4 C)   TempSrc:   Oral   SpO2: 100%   100%  Weight:      Height:       Body mass index is 26.9 kg/m.  Physical Exam  Constitutional:  She remains on the ventilator via tracheostomy.  She is under a warming blanket.  Cardiovascular: Regular rhythm.  No murmur heard. She is tachycardic.  Pulmonary/Chest:  Breath sounds normal.  Abdominal:  Minimal drain output.  She is still having rectal bleeding with clots.    Lab Results Lab Results  Component Value Date   WBC 21.1 (H) 09/22/2017   HGB 9.6 (L) 09/22/2017   HCT 29.9 (L) 09/22/2017   MCV 99.0 09/22/2017   PLT 66 (L) 09/22/2017    Lab Results  Component Value Date   CREATININE 0.64 09/22/2017   BUN 23 (H) 09/22/2017   NA 132 (L) 09/22/2017   K 4.3 09/22/2017   CL 100 (L) 09/22/2017   CO2 24 09/22/2017    Lab Results  Component Value Date     ALT 249 (H) 09/22/2017   AST 266 (H) 09/22/2017   ALKPHOS 378 (H) 09/22/2017   BILITOT 24.2 ([place]) 09/22/2017     Microbiology: Recent Results (from the past 240 hour(s))  Culture, blood (routine x 2)     Status: None (Preliminary result)   Collection Time: 09/17/17 11:30 AM  Result Value Ref Range Status   Specimen Description BLOOD RIGHT ANTECUBITAL  Final   Special Requests   Final    BOTTLES DRAWN AEROBIC AND ANAEROBIC Blood Culture adequate volume   Culture  Setup Time   Final    AEROBIC BOTTLE ONLY YEAST CRITICAL VALUE NOTED.  VALUE IS CONSISTENT WITH PREVIOUSLY REPORTED AND CALLED VALUE.    Culture   Final    YEAST IDENTIFICATION TO FOLLOW Performed at [facility], [place] [address], [place], [place] [address]    Report Status PENDING  Incomplete  Culture, blood (routine x 2)     Status: Abnormal   Collection Time: 09/17/17 11:40 AM  Result Value Ref Range Status   Specimen Description BLOOD RIGHT FOREARM  Final   Special Requests   Final    BOTTLES DRAWN AEROBIC AND ANAEROBIC Blood Culture adequate volume   Culture  Setup Time   Final    YEAST AEROBIC BOTTLE ONLY CRITICAL RESULT CALLED TO, READ BACK BY AND VERIFIED WITH: Shellee Milo [facility] 09/19/17 2343 JDW Performed at [facility], [place] [address] [place], [place] [address]    Culture CANDIDA LUSITANIAE (A)  Final   Report Status 09/22/2017 FINAL  Final  Blood Culture ID Panel (Reflexed)     Status: None   Collection Time: 09/17/17 11:40 AM  Result Value Ref Range Status   Enterococcus species NOT DETECTED NOT DETECTED Final    Comment: CRITICAL RESULT CALLED TO, READ BACK BY AND VERIFIED WITH: L SEAY PHARMD 09/19/17 2343 JDW    Listeria monocytogenes NOT DETECTED NOT DETECTED Final   Staphylococcus species NOT DETECTED NOT DETECTED Final   Staphylococcus aureus NOT DETECTED NOT DETECTED Final   Streptococcus species NOT DETECTED NOT DETECTED Final   Streptococcus agalactiae NOT DETECTED NOT DETECTED Final    Streptococcus pneumoniae NOT DETECTED NOT DETECTED Final   Streptococcus pyogenes NOT DETECTED NOT DETECTED Final   Acinetobacter baumannii NOT DETECTED NOT DETECTED Final   Enterobacteriaceae species NOT DETECTED NOT DETECTED Final   Enterobacter cloacae complex NOT DETECTED NOT DETECTED Final   Escherichia coli NOT DETECTED NOT DETECTED Final   Klebsiella oxytoca NOT DETECTED NOT DETECTED Final   Klebsiella pneumoniae NOT DETECTED NOT DETECTED Final   Proteus species NOT DETECTED NOT DETECTED Final   Serratia marcescens NOT DETECTED NOT DETECTED Final   Haemophilus influenzae NOT DETECTED NOT DETECTED Final   Neisseria meningitidis NOT DETECTED NOT DETECTED Final   Pseudomonas aeruginosa NOT DETECTED NOT DETECTED Final  Candida albicans NOT DETECTED NOT DETECTED Final   Candida glabrata NOT DETECTED NOT DETECTED Final   Candida krusei NOT DETECTED NOT DETECTED Final   Candida parapsilosis NOT DETECTED NOT DETECTED Final   Candida tropicalis NOT DETECTED NOT DETECTED Final    Comment: Performed at [facility], [place] [address], [place], [place] [address]  Culture, blood (Routine X 2) w Reflex to ID Panel     Status: None (Preliminary result)   Collection Time: 09/21/17  6:55 AM  Result Value Ref Range Status   Specimen Description BLOOD RIGHT HAND  Final   Special Requests   Final    BOTTLES DRAWN AEROBIC ONLY Blood Culture results may not be optimal due to an inadequate volume of blood received in culture bottles   Culture   Final    NO GROWTH 1 DAY Performed at [facility], [place] [address], [place], [place] [address]    Report Status PENDING  Incomplete    Michel Bickers, MD [facility] [place] [phone] pager   [phone] cell 09/22/2017, 1:45 PM

## 2017-09-22 NOTE — Progress Notes (Signed)
[facility]  [address] [place], [place] [address] Phone: [phone]  FAX: [phone]      Pam Fuller [id number] 02/03/1933  CARE TEAM:  PCP: No primary care provider on file.  Outpatient Care Team: Patient Care Team: Belva Crome, MD as PCP - Cardiology (Cardiology)  Inpatient Treatment Team: Treatment Team: Attending Provider: Juanito Doom, MD; Consulting Physician: Edison Pace, Md, MD; Consulting Physician: Roney Jaffe, MD; Registered Nurse: Kennith Center, RN; Rounding Team: Dorthy Cooler Radiology, MD; Rounding Team: Pccm, Md, MD; Registered Nurse: Ernestene Kiel, RN; Technician: [place], [place], [place]; Physician Assistant: Alric Seton, PA-C; Registered Nurse: Illene Regulus, RN; Registered Nurse: Owens Shark, RN; Registered Nurse: Elveria Royals, RN; Nurse Practitioner: Philis Pique, NP; Registered Nurse: Rowe Pavy, RN   Problem List:   Principal Problem:   Bile leak, delayed, s/p ERCP/stent 09/06/2017 Active Problems:   ESRD on hemodialysis [facility])   Essential hypertension   GERD (gastroesophageal reflux disease)   Acute cholecystitis s/p lap cholecystectomy 08/20/2017   Acute respiratory failure (HCC)   Aspiration into airway   Protein-calorie malnutrition, severe ([place])   Septic shock ([place])   NSTEMI (non-ST elevated myocardial infarction) ([place])   Shock liver   Coagulopathy ([place])   Bile peritonitis ([place])   UGIB (upper gastrointestinal bleed)   Pressure injury of skin   Fungemia   Abdominal wall abscess   Generalized abdominal pain   Goals of care, counseling/discussion   Palliative care by specialist   12 Days Post-Op  09/08/2017 - 08/23/2017  Procedure(s): ENDOSCOPIC RETROGRADE CHOLANGIOPANCREATOGRAPHY (ERCP)   Assessment/Plan:  [place] given MSOF, FTT  Septic shock Acute cholecystitis s/p lap chole4/6/19 by Dr. Redmond Pulling - CT scan 4/8 w/  small amount fluid around liver and layering in pelvis ; HIDA 4/9 positive for bile leak - CT 4/21 with multiple fluid collections -additional drain placed 4/24; Cx negative -ERCP 4/23 w stent placement (Dr Fuller Plan); erosive gastritis noted - continue PPI - continue drains -consider perc drain of R anterior fluid collection & restart IV ABx  if staying aggressive - d/w Dr Lake Bells with CCM - family wishes to do no more procedures & follow  Shock liver Hyperbilirubinemia - drains stable, suspect 2/2 prolonged TNA/cholestasis.   Leukocytosis- worsening - consider placing drain Severe protein-calorie malnutrition- TNA   VDRF secondary to aspiration/PNA-PNA resolved andCCMfollowing - appreciate assistance.  - s/p trach 4/28on full vent support.  weaning  NSTEMI4/12 LUE DVT4/17  ABL anemia- hgb/hct9.3/29.7; s/p 1 unit PRBC and 1 unit platelets 4/30 Thrombocytopenia -40,000   ID -zosyn 4/4-4/7,Unasyn 4/11-4/12, Zosyn 4/12 -4/19, Zosyn 4/21-4/29, Eraxis 4/30 >>.  Consider restarting Zosyn  FEN -NPO/NGT to LIWS.  Try trophic TFs (on Levophed = do not escalate rate >20) VTE -SCDs Foley -none, anuric Follow up -Dr. Redmond Pulling  Dispo: Prognosis grim.  Trophic TFs  TNA Worse off IV ABx - consider restart & drain last undrainned collection if family wishes to be aggressive still - they do not for now Agree w Pall eval & meeting to discuss goals of care Continue abdominal drains. No acute surgical intervention indicated.  Agree w palliative care eval.  Since family does not want to be more aggressive, CCS will follow more peripherally for now      Adin Hector, M.D., F.A.C.S. Gastrointestinal and Minimally Invasive Surgery [facility] [address] [place], [place] [address] [phone] Main / Paging  09/22/2017    Subjective: (Chief complaint)  Intubated/sedated ICU RN in room Dr Lake Bells outside  door  Objective:  Vital signs:  Vitals:   09/22/17 0600 09/22/17 0700 09/22/17 0751 09/22/17 0800  BP: (!) 97/48 (!) 85/47  100/61  Pulse: 74 76    Resp: (!) 21 17  17   Temp:   (!) 97.4 F (36.3 C)   TempSrc:   Oral   SpO2: 100% 95%    Weight:      Height:        Last BM Date: 09/21/17  Intake/Output   Yesterday:  05/04 0701 - 05/05 0700 In: 1809.9 [I.V.:1633.9; IV Piggyback:156] Out: 764 [Emesis/NG output:140; Drains:125] This shift:  Total I/O In: 65.3 [I.V.:65.3] Out: 10 [Other:10]  Bowel function:  Flatus: YES  BM:  YES  Drain: Bilious   Physical Exam:  General: Pt intubated/sedated.  Not interactive.  Was following some commands w ICU RN earlier Eyes: PERRL - weak  Sclera bilious Neuro: w/o focal sensory/motor deficits. Lymph: No head/neck/groin lymphadenopathy Psych:  No delerium/psychosis/paranoia HENT: Normocephalic, Mucus membranes moist.  No thrush Neck: Supple, No tracheal deviation.  Trach clean - no signif secreations Chest: No chest wall pain w good excursion CV:  Pulses intact.  Regular rhythm MS: Normal AROM mjr joints.  No obvious deformity  Abdomen: Somewhat firm.  Mildy distended.  Nontender.  No evidence of peritonitis.  No incarcerated hernias.  Bilious perc drains  Ext:  No deformity.  No mjr edema.  No cyanosis Skin: No petechiae / purpura  Results:   Labs: Results for orders placed or performed during the hospital encounter of 08/22/2017 (from the past 48 hour(s))  Glucose, capillary     Status: Abnormal   Collection Time: 09/20/17 12:13 PM  Result Value Ref Range   Glucose-Capillary 196 (H) 65 - 99 mg/dL   Comment 1 Capillary Specimen    Comment 2 Notify RN   Glucose, capillary     Status: Abnormal   Collection Time: 09/20/17  4:13 PM  Result Value Ref Range   Glucose-Capillary 217 (H) 65 - 99 mg/dL   Comment 1 Capillary Specimen    Comment 2 Notify RN   Renal function panel (daily at 1600)     Status: Abnormal    Collection Time: 09/20/17  4:45 PM  Result Value Ref Range   Sodium 135 135 - 145 mmol/L   Potassium 4.1 3.5 - 5.1 mmol/L   Chloride 102 101 - 111 mmol/L   CO2 25 22 - 32 mmol/L   Glucose, Bld 255 (H) 65 - 99 mg/dL   BUN 40 (H) 6 - 20 mg/dL   Creatinine, Ser 0.62 0.44 - 1.00 mg/dL   Calcium 8.9 8.9 - 10.3 mg/dL   Phosphorus 3.7 2.5 - 4.6 mg/dL   Albumin 2.0 (L) 3.5 - 5.0 g/dL   GFR calc non Af Amer >60 >60 mL/min   GFR calc Af Amer >60 >60 mL/min    Comment: (NOTE) The eGFR has been calculated using the CKD EPI equation. This calculation has not been validated in all clinical situations. eGFR's persistently <60 mL/min signify possible Chronic Kidney Disease.    Anion gap 8 5 - 15    Comment: Performed at [facility] [address], [place], [place] [address]  Glucose, capillary     Status: Abnormal   Collection Time: 09/20/17  7:34 PM  Result Value Ref Range   Glucose-Capillary 210 (H) 65 - 99 mg/dL  Comment 1 Capillary Specimen    Comment 2 Notify RN   Glucose, capillary     Status: Abnormal   Collection Time: 09/20/17 11:33 PM  Result Value Ref Range   Glucose-Capillary 105 (H) 65 - 99 mg/dL   Comment 1 Capillary Specimen    Comment 2 Notify RN   Glucose, capillary     Status: Abnormal   Collection Time: 09/21/17  3:18 AM  Result Value Ref Range   Glucose-Capillary 109 (H) 65 - 99 mg/dL   Comment 1 Capillary Specimen    Comment 2 Notify RN   Magnesium     Status: None   Collection Time: 09/21/17  4:09 AM  Result Value Ref Range   Magnesium 2.2 1.7 - 2.4 mg/dL    Comment: Performed at [facility], [place] [address], [place], [place] [address]  Comprehensive metabolic panel     Status: Abnormal   Collection Time: 09/21/17  4:09 AM  Result Value Ref Range   Sodium 134 (L) 135 - 145 mmol/L   Potassium 4.2 3.5 - 5.1 mmol/L   Chloride 101 101 - 111 mmol/L   CO2 26 22 - 32 mmol/L   Glucose, Bld 135 (H) 65 - 99 mg/dL   BUN 31 (H) 6 - 20 mg/dL   Creatinine,  Ser 0.58 0.44 - 1.00 mg/dL   Calcium 9.1 8.9 - 10.3 mg/dL   Total Protein 6.2 (L) 6.5 - 8.1 g/dL   Albumin 1.9 (L) 3.5 - 5.0 g/dL   AST 226 (H) 15 - 41 U/L   ALT 197 (H) 14 - 54 U/L   Alkaline Phosphatase 351 (H) 38 - 126 U/L   Total Bilirubin 23.8 (HH) 0.3 - 1.2 mg/dL    Comment: CRITICAL RESULT CALLED TO, READ BACK BY AND VERIFIED WITH: CLARK,A RN 09/21/2017 0505 JORDANS    GFR calc non Af Amer >60 >60 mL/min   GFR calc Af Amer >60 >60 mL/min    Comment: (NOTE) The eGFR has been calculated using the CKD EPI equation. This calculation has not been validated in all clinical situations. eGFR's persistently <60 mL/min signify possible Chronic Kidney Disease.    Anion gap 7 5 - 15    Comment: Performed at [facility] [address] [place], [place] [address]  Phosphorus     Status: None   Collection Time: 09/21/17  4:09 AM  Result Value Ref Range   Phosphorus 3.6 2.5 - 4.6 mg/dL    Comment: Performed at [facility] [address] [place], [place] [address]  CBC     Status: Abnormal   Collection Time: 09/21/17  4:09 AM  Result Value Ref Range   WBC 19.8 (H) 4.0 - 10.5 K/uL   RBC 2.98 (L) 3.87 - 5.11 MIL/uL   Hemoglobin 9.7 (L) 12.0 - 15.0 g/dL   HCT 29.8 (L) 36.0 - 46.0 %   MCV 100.0 78.0 - 100.0 fL   MCH 32.6 26.0 - 34.0 pg   MCHC 32.6 30.0 - 36.0 g/dL   RDW 27.5 (H) 11.5 - 15.5 %   Platelets 50 (L) 150 - 400 K/uL    Comment: CONSISTENT WITH PREVIOUS RESULT Performed at [facility], [place] [address], [place], [place] [address]   Glucose, capillary     Status: Abnormal   Collection Time: 09/21/17  7:41 AM  Result Value Ref Range   Glucose-Capillary 152 (H) 65 - 99 mg/dL   Comment 1 Capillary Specimen  Comment 2 Notify RN   Glucose, capillary     Status: Abnormal   Collection Time: 09/21/17 11:44 AM  Result Value Ref Range   Glucose-Capillary 105 (H) 65 - 99 mg/dL   Comment 1 Capillary Specimen    Comment 2 Notify RN   Renal function panel  (daily at 1600)     Status: Abnormal   Collection Time: 09/21/17  3:15 PM  Result Value Ref Range   Sodium 133 (L) 135 - 145 mmol/L   Potassium 4.3 3.5 - 5.1 mmol/L   Chloride 100 (L) 101 - 111 mmol/L   CO2 25 22 - 32 mmol/L   Glucose, Bld 144 (H) 65 - 99 mg/dL   BUN 27 (H) 6 - 20 mg/dL   Creatinine, Ser 0.62 0.44 - 1.00 mg/dL   Calcium 8.9 8.9 - 10.3 mg/dL   Phosphorus 4.0 2.5 - 4.6 mg/dL   Albumin 1.8 (L) 3.5 - 5.0 g/dL   GFR calc non Af Amer >60 >60 mL/min   GFR calc Af Amer >60 >60 mL/min    Comment: (NOTE) The eGFR has been calculated using the CKD EPI equation. This calculation has not been validated in all clinical situations. eGFR's persistently <60 mL/min signify possible Chronic Kidney Disease.    Anion gap 8 5 - 15    Comment: Performed at [facility] [address] [place], [place] [address]  Glucose, capillary     Status: Abnormal   Collection Time: 09/21/17  3:22 PM  Result Value Ref Range   Glucose-Capillary 155 (H) 65 - 99 mg/dL   Comment 1 Capillary Specimen    Comment 2 Notify RN   Glucose, capillary     Status: Abnormal   Collection Time: 09/21/17  7:40 PM  Result Value Ref Range   Glucose-Capillary 125 (H) 65 - 99 mg/dL   Comment 1 Notify RN   Glucose, capillary     Status: None   Collection Time: 09/21/17 11:45 PM  Result Value Ref Range   Glucose-Capillary 90 65 - 99 mg/dL   Comment 1 Notify RN   Magnesium     Status: None   Collection Time: 09/22/17  3:24 AM  Result Value Ref Range   Magnesium 2.3 1.7 - 2.4 mg/dL    Comment: Performed at [facility], [place] [address] [place], [place] [address]  Comprehensive metabolic panel     Status: Abnormal   Collection Time: 09/22/17  3:24 AM  Result Value Ref Range   Sodium 132 (L) 135 - 145 mmol/L   Potassium 4.3 3.5 - 5.1 mmol/L   Chloride 100 (L) 101 - 111 mmol/L   CO2 24 22 - 32 mmol/L   Glucose, Bld 133 (H) 65 - 99 mg/dL   BUN 23 (H) 6 - 20 mg/dL   Creatinine, Ser 0.64 0.44 - 1.00  mg/dL   Calcium 8.6 (L) 8.9 - 10.3 mg/dL   Total Protein 5.9 (L) 6.5 - 8.1 g/dL   Albumin 1.8 (L) 3.5 - 5.0 g/dL   AST 266 (H) 15 - 41 U/L   ALT 249 (H) 14 - 54 U/L   Alkaline Phosphatase 378 (H) 38 - 126 U/L   Total Bilirubin 24.2 (HH) 0.3 - 1.2 mg/dL    Comment: CRITICAL RESULT CALLED TO, READ BACK BY AND VERIFIED WITH: DADAMO,H RN 09/22/2017 0506 JORDANS    GFR calc non Af Amer >60 >60 mL/min   GFR calc Af Amer >60 >60 mL/min  Comment: (NOTE) The eGFR has been calculated using the CKD EPI equation. This calculation has not been validated in all clinical situations. eGFR's persistently <60 mL/min signify possible Chronic Kidney Disease.    Anion gap 8 5 - 15    Comment: Performed at [facility] [address], [place], [place] [address]  Phosphorus     Status: None   Collection Time: 09/22/17  3:24 AM  Result Value Ref Range   Phosphorus 3.8 2.5 - 4.6 mg/dL    Comment: Performed at [facility] [address], [place], [place] [address]  CBC     Status: Abnormal   Collection Time: 09/22/17  3:24 AM  Result Value Ref Range   WBC 21.1 (H) 4.0 - 10.5 K/uL    Comment: WHITE COUNT CONFIRMED ON SMEAR   RBC 3.02 (L) 3.87 - 5.11 MIL/uL   Hemoglobin 9.6 (L) 12.0 - 15.0 g/dL   HCT 29.9 (L) 36.0 - 46.0 %   MCV 99.0 78.0 - 100.0 fL   MCH 31.8 26.0 - 34.0 pg   MCHC 32.1 30.0 - 36.0 g/dL   RDW NOT CALCULATED 11.5 - 15.5 %   Platelets 66 (L) 150 - 400 K/uL    Comment: PLATELET COUNT CONFIRMED BY SMEAR Performed at [facility], [place] [address] [place], [place] [address]   Glucose, capillary     Status: Abnormal   Collection Time: 09/22/17  3:28 AM  Result Value Ref Range   Glucose-Capillary 130 (H) 65 - 99 mg/dL   Comment 1 Notify RN   Glucose, capillary     Status: Abnormal   Collection Time: 09/22/17  7:53 AM  Result Value Ref Range   Glucose-Capillary 117 (H) 65 - 99 mg/dL   Comment 1 Capillary Specimen    Comment 2 Notify RN     Imaging /  Studies: No results found.  Medications / Allergies: per chart  Antibiotics: Anti-infectives (From admission, onward)   Start     Dose/Rate Route Frequency Ordered Stop   09/20/17 1000  anidulafungin (ERAXIS) 100 mg in sodium chloride 0.9 % 100 mL IVPB     100 mg 78 mL/hr over 100 Minutes Intravenous Every 24 hours 09/20/17 0845     09/18/17 1100  anidulafungin (ERAXIS) 100 mg in sodium chloride 0.9 % 100 mL IVPB  Status:  Discontinued     100 mg 78 mL/hr over 100 Minutes Intravenous Every 24 hours 09/17/17 0858 09/19/17 1130   09/17/17 1400  meropenem (MERREM) 1 g in sodium chloride 0.9 % 100 mL IVPB  Status:  Discontinued     1 g 200 mL/hr over 30 Minutes Intravenous Every 12 hours 09/17/17 1344 09/19/17 1130   09/17/17 1100  anidulafungin (ERAXIS) 200 mg in sodium chloride 0.9 % 200 mL IVPB     200 mg 78 mL/hr over 200 Minutes Intravenous  Once 09/17/17 0858 09/17/17 1329   09/08/17 2000  piperacillin-tazobactam (ZOSYN) IVPB 3.375 g  Status:  Discontinued     3.375 g 100 mL/hr over 30 Minutes Intravenous Every 6 hours 09/08/17 1946 09/16/17 1151   09/05/17 2000  piperacillin-tazobactam (ZOSYN) IVPB 3.375 g     3.375 g 12.5 mL/hr over 240 Minutes Intravenous Every 12 hours 09/05/17 1126 09/07/17 0154   08/31/17 0800  piperacillin-tazobactam (ZOSYN) IVPB 3.375 g  Status:  Discontinued     3.375 g 100 mL/hr over 30 Minutes Intravenous Every 6 hours 08/31/17 0739 09/05/17 1126   08/30/17  2000  Ampicillin-Sulbactam (UNASYN) 3 g in sodium chloride 0.9 % 100 mL IVPB  Status:  Discontinued     3 g 200 mL/hr over 30 Minutes Intravenous Every 24 hours 08/22/2017 0953 08/30/17 0945   08/30/17 1230  piperacillin-tazobactam (ZOSYN) IVPB 3.375 g  Status:  Discontinued     3.375 g 12.5 mL/hr over 240 Minutes Intravenous Every 12 hours 08/30/17 1134 08/31/17 0738   08/20/2017 1030  Ampicillin-Sulbactam (UNASYN) 3 g in sodium chloride 0.9 % 100 mL IVPB     3 g 200 mL/hr over 30 Minutes Intravenous   Once 09/11/2017 0953 08/20/2017 1246   08/27/2017 0000  ampicillin-sulbactam (UNASYN) 1.5 g in sodium chloride 0.9 % 100 mL IVPB  Status:  Discontinued     1.5 g 200 mL/hr over 30 Minutes Intravenous Once 08/28/17 1045 09/04/2017 0928   09/13/2017 2200  piperacillin-tazobactam (ZOSYN) IVPB 3.375 g    Note to Pharmacy:  Zosyn 3.375 g IV q12h in ESRD on HD   3.375 g 12.5 mL/hr over 240 Minutes Intravenous Every 12 hours 09/06/2017 1155 08/25/17 1335   08/22/17 1000  piperacillin-tazobactam (ZOSYN) IVPB 3.375 g  Status:  Discontinued    Note to Pharmacy:  Zosyn 3.375 g IV q12h in ESRD on HD   3.375 g 12.5 mL/hr over 240 Minutes Intravenous Every 12 hours 08/22/17 0128 08/27/2017 1155   08/22/17 0045  piperacillin-tazobactam (ZOSYN) IVPB 3.375 g     3.375 g 12.5 mL/hr over 240 Minutes Intravenous  Once 08/22/17 0038 08/22/17 0145        Note: Portions of this report may have been transcribed using voice recognition software. Every effort was made to ensure accuracy; however, inadvertent computerized transcription errors may be present.   Any transcriptional errors that result from this process are unintentional.     Adin Hector, M.D., F.A.C.S. Gastrointestinal and Minimally Invasive Surgery [facility] [address], [place], [place] [address] [phone] Main / Paging   09/22/2017

## 2017-09-22 NOTE — Progress Notes (Signed)
[facility] Progress Note  Subjective: no changes overnight  Vitals:   09/22/17 0800 09/22/17 0835 09/22/17 0900 09/22/17 1000  BP: 100/61  (!) 95/40 (!) 125/53  Pulse:      Resp: 17  (!) 23 13  Temp: (!) 97.4 F (36.3 C)     TempSrc: Oral     SpO2:  100%    Weight:      Height:        Inpatient medications: . chlorhexidine gluconate (MEDLINE KIT)  15 mL Mouth Rinse BID  . Chlorhexidine Gluconate Cloth  6 each Topical Daily  . darbepoetin (ARANESP) injection - DIALYSIS  150 mcg Subcutaneous Q Mon-1800  . doxercalciferol  2 mcg Intravenous Q M,W,F-1800  . feeding supplement (VITAL HIGH PROTEIN)  1,000 mL Per Tube Q24H  . hydrocortisone sod succinate (SOLU-CORTEF) inj  50 mg Intravenous Q6H  . insulin aspart  0-15 Units Subcutaneous Q4H  . mouth rinse  15 mL Mouth Rinse QID  . pantoprazole (PROTONIX) IV  40 mg Intravenous Q12H  . sodium chloride flush  10-40 mL Intracatheter Q12H   . sodium chloride Stopped (09/20/17 1703)  . anidulafungin Stopped (09/22/17 1059)  . dextrose 50 mL/hr at 09/22/17 1100  . fentaNYL infusion INTRAVENOUS 125 mcg/hr (09/22/17 1100)  . norepinephrine (LEVOPHED) Adult infusion 2.027 mcg/min (09/22/17 1100)  . dialysis replacement fluid (prismasate) 400 mL/hr at 09/21/17 2326  . dialysis replacement fluid (prismasate) 200 mL/hr at 09/21/17 2327  . dialysate (PRISMASATE) 1,300 mL/hr at 09/22/17 1059  . sodium chloride 999 mL/hr at 09/15/17 0119  . vasopressin (PITRESSIN) infusion - *FOR SHOCK* Stopped (09/17/17 1500)   albuterol, alteplase, docusate, fentaNYL (SUBLIMAZE) injection, heparin, midazolam, sodium chloride  Exam: On vent, not responsive trach in place No jvd, +scleral icterus Chest coarse bilat RRR no mrg abd soft ntnd  abd drains in place Ext 1-2+ edema upper extremities nf not following commands eyes open lua avg w bruit   [facility] 63mn  61.5kg   2/2.25 bath p4  AVG  Heparin 4000 - hect 2 ug q hd  iv - mircera 30 ug every 2 wks iv at dialysis       Impression: 1 cholecystitis: sp lap chole w bile leak 2 jaundice w liver failure / underlying cirrhosis 3 biloma/intraperitoneal abscesses - drains placed 4 +yeast in blood culture per id 5 esrd - cont crrt as tolerated, net neg 50 cc/hr for now 6 vdrf - sp trach 7 anemia ckd/ abl - on darbe 150 ug weekly, hb 9- 10 8 gi bleed gastritis seen at [facility], sp multiple prbc's and ffp / plts 9 poor prognosis - pall care and ccm meeting w/ family   Plan - as above   RKelly SplinterMD [facility] pager [phone]  09/22/2017, 11:19 AM   Recent Labs  Lab 09/21/17 0409 09/21/17 1515 09/22/17 0324  NA 134* 133* 132*  K 4.2 4.3 4.3  CL 101 100* 100*  CO2 26 25 24   GLUCOSE 135* 144* 133*  BUN 31* 27* 23*  CREATININE 0.58 0.62 0.64  CALCIUM 9.1 8.9 8.6*  PHOS 3.6 4.0 3.8   Recent Labs  Lab 09/20/17 0417  09/21/17 0409 09/21/17 1515 09/22/17 0324  AST 245*  --  226*  --  266*  ALT 190*  --  197*  --  249*  ALKPHOS 358*  --  351*  --  378*  BILITOT 24.4*  --  23.8*  --  24.2*  PROT 6.1*  --  6.2*  --  5.9*  ALBUMIN 2.2*   < > 1.9* 1.8* 1.8*   < > = values in this interval not displayed.   Recent Labs  Lab 09/16/17 0510  09/19/17 0355 09/20/17 0417 09/21/17 0409 09/22/17 0324  WBC 16.5*   < > 15.5* 16.9* 19.8* 21.1*  NEUTROABS 14.4*  --  14.4*  --   --   --   HGB 10.1*   < > 9.3* 9.5* 9.7* 9.6*  HCT 31.3*   < > 29.7* 29.8* 29.8* 29.9*  MCV 97.2   < > 99.7 100.0 100.0 99.0  PLT 33*   < > 40* 40* 50* 66*   < > = values in this interval not displayed.   Iron/TIBC/Ferritin/ %Sat    Component Value Date/Time   IRON 84 04/15/2010 1030   TIBC 178 (L) 04/15/2010 1030   FERRITIN 784 (H) 04/15/2010 1030   IRONPCTSAT 47 04/15/2010 1030

## 2017-09-22 NOTE — Progress Notes (Signed)
PULMONARY / CRITICAL CARE MEDICINE   Name: Pam Fuller MRN: [id number] DOB: 18-Aug-1932    ADMISSION DATE:  09/09/2017 CONSULTATION DATE:  4/11  REFERRING MD:  Maudie Mercury  CHIEF COMPLAINT:  Respiratory failure  HISTORY OF PRESENT ILLNESS:   84 y/o female with a prolonged hospitalization post lap chole who had a bile leak, multiple abdominal fluid collections, aspiration pneumonia, respiratory failure and ERSD on CVVHD.  SUBJECTIVE:  Family conversation yesterday: continue full support, limited DNR, no further conversations  VITAL SIGNS: BP 100/61   Pulse 76   Temp (!) 97.4 F (36.3 C) (Oral)   Resp 17   Ht 5\' 2"  (1.575 m)   Wt 147 lb 0.8 oz (66.7 kg)   SpO2 95%   BMI 26.90 kg/m   HEMODYNAMICS:    VENTILATOR SETTINGS: Vent Mode: PRVC FiO2 (%):  [30 %] 30 % Set Rate:  [14 bmp] 14 bmp Vt Set:  [400 mL] 400 mL PEEP:  [5 cmH20] 5 cmH20 Pressure Support:  [10 cmH20] 10 cmH20 Plateau Pressure:  [11 cmH20-15 cmH20] 15 cmH20  INTAKE / OUTPUT: I/O last 3 completed shifts: In: 2648.1 [I.V.:2472.1; Other:20; IV Piggyback:156] Out: 1914 [Emesis/NG output:265; Drains:195; Other:774]  PHYSICAL EXAMINATION:   General:  In bed on vent HENT: NCAT Trach in place PULM: CTA B, vent supported breathing CV: RRR, no mgr GI: minimal bowel sounds, two drains in place MSK: normal bulk and tone Neuro: sedated on vent     LABS:  BMET Recent Labs  Lab 09/21/17 0409 09/21/17 1515 09/22/17 0324  NA 134* 133* 132*  K 4.2 4.3 4.3  CL 101 100* 100*  CO2 26 25 24   BUN 31* 27* 23*  CREATININE 0.58 0.62 0.64  GLUCOSE 135* 144* 133*    Electrolytes Recent Labs  Lab 09/20/17 0417  09/21/17 0409 09/21/17 1515 09/22/17 0324  CALCIUM 9.1   < > 9.1 8.9 8.6*  MG 2.2  --  2.2  --  2.3  PHOS 3.7   < > 3.6 4.0 3.8   < > = values in this interval not displayed.    CBC Recent Labs  Lab 09/20/17 0417 09/21/17 0409 09/22/17 0324  WBC 16.9* 19.8* 21.1*  HGB 9.5* 9.7* 9.6*   HCT 29.8* 29.8* 29.9*  PLT 40* 50* 66*    Coag's Recent Labs  Lab 09/18/17 0446 09/19/17 0355 09/20/17 0417  INR 2.12 1.90 1.88    Sepsis Markers Recent Labs  Lab 09/17/17 0905 09/18/17 0446 09/19/17 0355  PROCALCITON 4.28 4.10 3.64    ABG No results for input(s): PHART, PCO2ART, PO2ART in the last 168 hours.  Liver Enzymes Recent Labs  Lab 09/20/17 0417  09/21/17 0409 09/21/17 1515 09/22/17 0324  AST 245*  --  226*  --  266*  ALT 190*  --  197*  --  249*  ALKPHOS 358*  --  351*  --  378*  BILITOT 24.4*  --  23.8*  --  24.2*  ALBUMIN 2.2*   < > 1.9* 1.8* 1.8*   < > = values in this interval not displayed.    Cardiac Enzymes Recent Labs  Lab 09/17/17 0905  TROPONINI 0.16*    Glucose Recent Labs  Lab 09/21/17 1144 09/21/17 1522 09/21/17 1940 09/21/17 2345 09/22/17 0328 09/22/17 0753  GLUCAP 105* 155* 125* 90 130* 117*    Imaging No results found.   STUDIES:  CT abd/pelv 4/4 >> acute cholecystitis, gallstone in GB neck, CBD 10 mm  CT Head:  no acute changes, chronic small vessel ischemic changes  CT abd/pelvis 4/8 >> small-mod fluid in abd/pelvis  Hepatobiliary scan 4/9 >> bile leak  TTE 4/12 >> EF 65-70%, G1DD, narrow LVOT, mild TR, mild pHTN  U/S Abdomen 4/14 >> small fluid near cholecystectomy site  Hepatobiliary scan 4/14 >> Rt subhepatic fluid collection consistent w/ biloma; increased in size from prior  CT abd/pelvis w/ oral contrast 4/16 >> drainage catheter at inferior liver margin with decreased perihepatic fluid; low-density fluid at left subdiaphragmatic area, questionable colonic wall thickening throughout, multifocal airspace disease of lung bases  CT abdomen.pelvis 4/21: interval growth of large perihepatic and perisplenic fluid collection with multiple, new intraperitoneal fluid collections. Resolving multifocal pneumonia.   Echo 5/1 LVEF WNL, no other acute abnormalities, mild AS, LA midly dilated  CT  abdom/pelvis 5/1 circumferential wall thickening splenic flexure likely ischemic injury to colon, fluid collections around liver decreased to unchanged, splenic infarcts, loculated fluid in anterior abdomen is smaller  CULTURES: Tracheal aspirate 4/11 >> no growth Abd fluid 4/11 >> no growth 5 days Abd fluid 4/15 >> no growth 3 days Abd fluid 4/25 > no growth 4/30 blood culture > 1/4 yeast 5/4 blood culture >   ANTIBIOTICS: Zosyn 4/3 >> 4/7 Unasyn 4/11 Zosyn 4/12 - 4/19 Zosyn 4/21-4/29 09/17/2017 meropenem > 5/2 09/17/2017 eraxis > 5/2  Eraxis 5/3 >    LINES/TUBES: ETT 4/11>4/28 OGT 4/11>4/28 PIV RUE 4/9 RLQ Biliary Drain 4/11 HD Cath LIJ 4/12 >> 4/19 L femoral trialysis catheter 4/21> RIJ cath 4/23>  Tracheostomy 4/28>  SIGNIFICANT EVENTS: 4/4 - admitted w/ acute cholecystitis 4/6 - lap chole 4/9 - bile leak seen on hepatobiliary scan 4/11 - Aspirated during prep for ERCP, intubated and transferred to ICU 4/11 - Rt abd fluid drain placed by IR 4/12- Heparin gtt started for NSTEMI; held after OG bleeding and trop peak 4/13 - worsening liver enzyymes. ESRD - Anuric. CRRT started. On levophed 29mcg and fent 164mcg 4/14 - Hgb 6.0, transfused 2 units PRBCs 4/17 - DVT LUE - started bivalirudin for suspected HIT 4/18 - CVVH d/c'ed, return to intermittent HD.  4/19 - Restarted HD. HIT negative  4/20 - Bleeding from GB drain, OGT, and stools. Received 1u pRBC. Heparin gtt held. 4/21 - Multiple, new fluid collections found on CT abd/pelvis   4/23 - ERCP with cystic duct bile leak s/p stent placement, erosive gastritis, and CBD dilation  4/24 - Mid-abdomen perc drain placed by IR  4/26 - Coffee-ground emesis after restarting tube feeds  4/28 - Tracheostomy  4/29 - Significant bloody output from NGT with drop in Hgb 10->7 09/18/2017 CT of the abdomen > fluid collection 09/18/2017 palliative care consult placed 09/20/2017 yeast in blood culture  5/4 long family conversation with  both brothers> limited code, no more procedures, continue medical support as we are doing   DISCUSSION:  84 y/o female with ESRD on HD, who is here with anemia, breast cancer here with acute cholecystitis.  S/p lap chole complicated by bile leak.  Prolonged ICU stay.  Developed fungemia 5/1  ASSESSMENT / PLAN:  PULMONARY A: Acute respiratory failure with hypoxemia Aspiration pneumonia> treated Tracheostomy in place P:   Pressure support as able Trach care per routine  CARDIOVASCULAR A:  Shock: multi-factorial, most recently related to adrenal insufficiency> improved, minimal levophed requirement 5/5 NSTEMI (demand ischemia) Prolonged QTc LUE DVT P:  Tele Continue stress dose steroids Continue levophed as needed for MAP > 65  RENAL A:   ESRD  on HD baseline AV fistula in LUE P:   Continue CVVHD Monitor/replace electrolytes  GASTROINTESTINAL A:   Severe protein calorie malnutrition Acute cholecystitis s/p lap chole 4/6 Bile leak s/p biliary stent via ERCP Biloma Erosive gastritis noted on ERCP 4/23 Hyper bilirubinemia likely due to intrahepatic cholestasis from TPN Ischemic change in colon (splenic flexure) Intermittent upper GI bleeding while on heparin P:   Hold TPN for fungemia Holding enteral nutrition in setting of likely ischemic colon change while still on vasopressors Continue PPI  HEMATOLOGIC A:   Anemia, no bleeding  Thrombocytopenia > worsening L IJ and upper extremity clot P:  Monitor for bleeding Holding heparin as she has had intermittent gi bleeding on this  INFECTIOUS A:   Peritonitis Aspiration pneumonia s/p treatment Biloma/intraperitoneal abscess Multiple drains placed in abscesses/biloma (intrahepatic) Fungemia P:   Continue antifungals Family does not desire more procedures (line changes, etc)  ENDOCRINE A:   Intermittent hypoglycemia P:   Monitor glucose  NEUROLOGIC A:   Deconditioning Pain P:   RASS goal 0 to -1   Fentanyl infusion for comfort   FAMILY  - Updates: 5/4 lengthy family conversation, see note; need to continue to converse with family regularly, advised we should have another goals of care conversation by 5/7  My cc time 31 minutes  Roselie Awkward, MD [facility] Pager: [phone] Cell: [phone] After 3pm or if no response, call [phone]   09/22/2017, 8:18 AM

## 2017-09-23 DIAGNOSIS — Z930 Tracheostomy status: Secondary | ICD-10-CM

## 2017-09-23 DIAGNOSIS — J9601 Acute respiratory failure with hypoxia: Secondary | ICD-10-CM

## 2017-09-23 DIAGNOSIS — E872 Acidosis: Secondary | ICD-10-CM

## 2017-09-23 DIAGNOSIS — Z515 Encounter for palliative care: Secondary | ICD-10-CM

## 2017-09-23 DIAGNOSIS — R601 Generalized edema: Secondary | ICD-10-CM

## 2017-09-23 DIAGNOSIS — I959 Hypotension, unspecified: Secondary | ICD-10-CM

## 2017-09-23 DIAGNOSIS — Z66 Do not resuscitate: Secondary | ICD-10-CM

## 2017-09-23 DIAGNOSIS — R001 Bradycardia, unspecified: Secondary | ICD-10-CM

## 2017-09-23 LAB — BLOOD GAS, ARTERIAL
Acid-base deficit: 16 mmol/L — ABNORMAL HIGH (ref 0.0–2.0)
Bicarbonate: 10.5 mmol/L — ABNORMAL LOW (ref 20.0–28.0)
DRAWN BY: 517021
FIO2: 30
MECHVT: 400 mL
O2 SAT: 97.2 %
PCO2 ART: 27.2 mmHg — AB (ref 32.0–48.0)
PEEP: 5 cmH2O
PH ART: 7.206 — AB (ref 7.350–7.450)
Patient temperature: 97.2
RATE: 14 resp/min
pO2, Arterial: 104 mmHg (ref 83.0–108.0)

## 2017-09-23 LAB — CBC
HCT: 32.4 % — ABNORMAL LOW (ref 36.0–46.0)
Hemoglobin: 10 g/dL — ABNORMAL LOW (ref 12.0–15.0)
MCH: 32.1 pg (ref 26.0–34.0)
MCHC: 30.9 g/dL (ref 30.0–36.0)
MCV: 103.8 fL — ABNORMAL HIGH (ref 78.0–100.0)
PLATELETS: 64 10*3/uL — AB (ref 150–400)
RBC: 3.12 MIL/uL — ABNORMAL LOW (ref 3.87–5.11)
WBC: 18.7 10*3/uL — ABNORMAL HIGH (ref 4.0–10.5)

## 2017-09-23 LAB — COMPREHENSIVE METABOLIC PANEL
ALBUMIN: 1.7 g/dL — AB (ref 3.5–5.0)
ALK PHOS: 414 U/L — AB (ref 38–126)
ALT: 322 U/L — ABNORMAL HIGH (ref 14–54)
AST: 398 U/L — AB (ref 15–41)
Anion gap: 15 (ref 5–15)
BILIRUBIN TOTAL: 24.2 mg/dL — AB (ref 0.3–1.2)
BUN: 15 mg/dL (ref 6–20)
CALCIUM: 8.6 mg/dL — AB (ref 8.9–10.3)
CO2: 15 mmol/L — ABNORMAL LOW (ref 22–32)
Chloride: 103 mmol/L (ref 101–111)
Creatinine, Ser: 0.46 mg/dL (ref 0.44–1.00)
GFR calc Af Amer: >60 mL/min (ref >60)
GFR calc non Af Amer: >60 mL/min (ref >60)
GLUCOSE: 91 mg/dL (ref 65–99)
Potassium: 5.9 mmol/L — ABNORMAL HIGH (ref 3.5–5.1)
Sodium: 133 mmol/L — ABNORMAL LOW (ref 135–145)
TOTAL PROTEIN: 5.9 g/dL — AB (ref 6.5–8.1)

## 2017-09-23 LAB — TROPONIN I
TROPONIN I: 0.08 ng/mL — AB (ref <0.03)
TROPONIN I: 0.24 ng/mL — AB (ref <0.03)

## 2017-09-23 LAB — GLUCOSE, CAPILLARY
GLUCOSE-CAPILLARY: 46 mg/dL — AB (ref 65–99)
GLUCOSE-CAPILLARY: 82 mg/dL (ref 65–99)
GLUCOSE-CAPILLARY: 27 mg/dL — AB (ref 65–99)
Glucose-Capillary: 109 mg/dL — ABNORMAL HIGH (ref 65–99)
Glucose-Capillary: 416 mg/dL — ABNORMAL HIGH (ref 65–99)

## 2017-09-23 LAB — CULTURE, BLOOD (ROUTINE X 2): Special Requests: ADEQUATE

## 2017-09-23 LAB — LACTIC ACID, PLASMA: LACTIC ACID, VENOUS: 12.5 mmol/L — AB (ref 0.5–1.9)

## 2017-09-23 LAB — PHOSPHORUS: Phosphorus: 5.4 mg/dL — ABNORMAL HIGH (ref 2.5–4.6)

## 2017-09-23 LAB — MAGNESIUM: Magnesium: 2.6 mg/dL — ABNORMAL HIGH (ref 1.7–2.4)

## 2017-09-23 MED ORDER — MORPHINE 100MG IN NS 100ML (1MG/ML) PREMIX INFUSION
1.0000 mg/h | INTRAVENOUS | Status: DC
Start: 2017-09-23 — End: 2017-09-23
  Filled 2017-09-23: qty 100

## 2017-09-23 MED ORDER — DEXTROSE 50 % IV SOLN
25.0000 g | Freq: Once | INTRAVENOUS | Status: AC
  Administered 2017-09-23: 25 g via INTRAVENOUS

## 2017-09-23 MED ORDER — STERILE WATER FOR INJECTION IV SOLN
INTRAVENOUS | Status: DC
  Administered 2017-09-23 (×2): via INTRAVENOUS_CENTRAL
  Filled 2017-09-23 (×4): qty 150

## 2017-09-23 MED ORDER — SODIUM CHLORIDE 0.9 % IV BOLUS
1000.0000 mL | Freq: Once | INTRAVENOUS | Status: AC
  Administered 2017-09-23: 1000 mL via INTRAVENOUS

## 2017-09-23 MED ORDER — INSULIN ASPART 100 UNIT/ML ~~LOC~~ SOLN: 0.0000 [IU] | SUBCUTANEOUS | Status: DC

## 2017-09-23 MED ORDER — DEXTROSE 50 % IV SOLN
INTRAVENOUS | Status: AC
  Administered 2017-09-23: 25 g via INTRAVENOUS
  Filled 2017-09-23: qty 50

## 2017-09-23 MED ORDER — SODIUM BICARBONATE 8.4 % IV SOLN
100.0000 meq | Freq: Once | INTRAVENOUS | Status: AC
  Administered 2017-09-23: 100 meq via INTRAVENOUS

## 2017-09-23 MED ORDER — SODIUM CHLORIDE 0.9 % IV BOLUS
500.0000 mL | Freq: Once | INTRAVENOUS | Status: AC
  Administered 2017-09-23: 500 mL via INTRAVENOUS

## 2017-09-23 MED ORDER — STERILE WATER FOR INJECTION IV SOLN
INTRAVENOUS | Status: DC
  Administered 2017-09-23: 05:00:00 via INTRAVENOUS
  Filled 2017-09-23 (×4): qty 850

## 2017-09-23 MED ORDER — ATROPINE SULFATE 1 MG/ML IJ SOLN
0.5000 mg | Freq: Once | INTRAMUSCULAR | Status: AC
  Administered 2017-09-23: 0.5 mg via INTRAVENOUS

## 2017-09-23 MED ORDER — POTASSIUM CHLORIDE 10 MEQ/50ML IV SOLN: 10.0000 meq | INTRAVENOUS | Status: DC

## 2017-09-23 MED ORDER — STERILE WATER FOR INJECTION IV SOLN
INTRAVENOUS | Status: DC
  Administered 2017-09-23 (×3): via INTRAVENOUS_CENTRAL
  Filled 2017-09-23 (×8): qty 150

## 2017-09-23 MED FILL — Medication: Qty: 1 | Status: AC

## 2017-09-25 ENCOUNTER — Telehealth: Payer: Self-pay

## 2017-09-25 NOTE — Telephone Encounter (Signed)
On 09/25/17 I received a d/c from [facility] (original). The d/c is for emtombment.  The patient is a patient of Doctor Nelda Marseille.  The d/c will be taken to [facility] ([address]) for signature.  On 09/30/2017 I received the d/c back from Doctor Nelda Marseille. I got the d/c ready and called the funeral home to let them know the d/c is ready for pickup.

## 2017-09-26 LAB — CULTURE, BLOOD (ROUTINE X 2)

## 2017-10-19 NOTE — Progress Notes (Addendum)
[facility] for Infectious Disease  Date of Admission:  09/17/2017           Day  7 Eraxis          ASSESSMENT: Ms. Deuser health continues to deteriorate. She became bradycardic, hypotensive, and severely acidotic overnight and required atropine and additional pressors. This morning she remains hypotensive despite addition of vasopressin and high dose levo. She is also on a bicarb infusion now. Bcx positive for C. lusitaniae. She is on day 7 of Eraxis.  TPN stopped 5/4. Repeat Bcx 5/4 negative to date.   PLAN: 1. Continue Eraxis  2. Watch repeat BCx 3. Await family meeting, further decisions re: comfort care.   Principal Problem:   Bile leak, delayed, s/p ERCP/stent 09/17/2017 Active Problems:   Essential hypertension   GERD (gastroesophageal reflux disease)   Acute cholecystitis s/p lap cholecystectomy 08/23/2017   Acute respiratory failure (HCC)   Aspiration into airway   ESRD on hemodialysis (HCC)   Protein-calorie malnutrition, severe (HCC)   Septic shock (HCC)   NSTEMI (non-ST elevated myocardial infarction) ([facility])   Shock liver   Coagulopathy (HCC)   Bile peritonitis (HCC)   UGIB (upper gastrointestinal bleed)   Pressure injury of skin   Fungemia   Abdominal wall abscess   Generalized abdominal pain   Goals of care, counseling/discussion   Palliative care by specialist   Malnutrition of moderate degree   Tracheostomy status (HCC)   Scheduled Meds: . chlorhexidine gluconate (MEDLINE KIT)  15 mL Mouth Rinse BID  . Chlorhexidine Gluconate Cloth  6 each Topical Daily  . darbepoetin (ARANESP) injection - DIALYSIS  150 mcg Subcutaneous Q Mon-1800  . doxercalciferol  2 mcg Intravenous Q M,W,F-1800  . feeding supplement (VITAL HIGH PROTEIN)  1,000 mL Per Tube Q24H  . hydrocortisone sod succinate (SOLU-CORTEF) inj  50 mg Intravenous Q6H  . insulin aspart  0-15 Units Subcutaneous Q4H  . mouth rinse  15 mL Mouth Rinse QID  . pantoprazole (PROTONIX) IV  40 mg  Intravenous Q12H  . sodium chloride flush  10-40 mL Intracatheter Q12H   Continuous Infusions: . sodium chloride Stopped (09/20/17 1703)  . anidulafungin Stopped (09/22/17 1059)  . dextrose 50 mL/hr at 2017/09/24 0000  . fentaNYL infusion INTRAVENOUS Stopped (2017/09/24 0410)  . norepinephrine (LEVOPHED) Adult infusion 30 mcg/min (Sep 24, 2017 0800)  . dialysate (PRISMASATE) 1,300 mL/hr at Sep 24, 2017 0659  .  sodium bicarbonate (isotonic) infusion in sterile water 125 mL/hr at 2017/09/24 0457  . sodium bicarbonate (isotonic) 1000 mL infusion 400 mL/hr at 09-24-2017 0744  . sodium bicarbonate (isotonic) 1000 mL infusion 200 mL/hr at 2017-09-24 0551  . sodium chloride 999 mL/hr at 09/15/17 0119  . vasopressin (PITRESSIN) infusion - *FOR SHOCK* 0.03 Units/min (09-24-17 0800)   PRN Meds:.albuterol, alteplase, docusate, fentaNYL (SUBLIMAZE) injection, heparin, midazolam, sodium chloride   SUBJECTIVE: Now hypotensive and acidotic requiring increasing doses of pressor. She is now DNR/DNI. Family does not wish to pursue further procedures but would like to continue medications.    Review of Systems: Unable to obtain as patient unresponsive and not following commands.    Allergies  Allergen Reactions  . Erythromycin Nausea And Vomiting  . Vicodin [Hydrocodone-Acetaminophen]     unknown    OBJECTIVE: Vitals:   09-24-2017 0745 2017-09-24 0748 09-24-2017 0755 Sep 24, 2017 0800  BP: 117/68  117/68 113/76  Pulse:   (!) 103   Resp: 18  19 20   Temp:  TempSrc:      SpO2:  (!) 88% (!) 88%   Weight:      Height:       Body mass index is 26.81 kg/m.  Physical Exam General: chronically-ill appearing elderly female in no acute distress  HENT: NCAT,neck supple and FROM, no LAD trach and NGT in place Eyes: scleral icterus noted  Cardiac: regular rate and rhythm, nl S1/S2, no murmurs, rubs or gallops  Pulm: CTAB, no wheezes or crackles, no increased work of breathing  Abd: soft, NTND, absent bowel sounds, NG  tube with bloody output  Neuro: eyes opened but unresponsive and not following commands  Ext: anasarcic    Lab Results Lab Results  Component Value Date   WBC 18.7 (H) 10/07/17   HGB 10.0 (L) 2017/10/07   HCT 32.4 (L) 10/07/2017   MCV 103.8 (H) 10-07-17   PLT 64 (L) Oct 07, 2017    Lab Results  Component Value Date   CREATININE 0.46 07-Oct-2017   BUN 15 10-07-17   NA 133 (L) 10/07/2017   K 5.9 (H) 10/07/2017   CL 103 Oct 07, 2017   CO2 15 (L) 10-07-2017    Lab Results  Component Value Date   ALT 322 (H) 2017-10-07   AST 398 (H) Oct 07, 2017   ALKPHOS 414 (H) 10/07/17   BILITOT 24.2 (HH) 10-07-17     Microbiology: Recent Results (from the past 240 hour(s))  Culture, blood (routine x 2)     Status: None (Preliminary result)   Collection Time: 09/17/17 11:30 AM  Result Value Ref Range Status   Specimen Description BLOOD RIGHT ANTECUBITAL  Final   Special Requests   Final    BOTTLES DRAWN AEROBIC AND ANAEROBIC Blood Culture adequate volume   Culture  Setup Time   Final    AEROBIC BOTTLE ONLY YEAST CRITICAL VALUE NOTED.  VALUE IS CONSISTENT WITH PREVIOUSLY REPORTED AND CALLED VALUE.    Culture   Final    YEAST IDENTIFICATION TO FOLLOW Performed at [facility], [place] [address], [place], [place] [address]    Report Status PENDING  Incomplete  Culture, blood (routine x 2)     Status: Abnormal   Collection Time: 09/17/17 11:40 AM  Result Value Ref Range Status   Specimen Description BLOOD RIGHT FOREARM  Final   Special Requests   Final    BOTTLES DRAWN AEROBIC AND ANAEROBIC Blood Culture adequate volume   Culture  Setup Time   Final    YEAST AEROBIC BOTTLE ONLY CRITICAL RESULT CALLED TO, READ BACK BY AND VERIFIED WITH: Shellee Milo [facility] 09/19/17 2343 JDW Performed at [facility], [place] [address] [place], [place] [address]    Culture CANDIDA LUSITANIAE (A)  Final   Report Status 09/22/2017 FINAL  Final  Blood Culture ID Panel (Reflexed)     Status: None     Collection Time: 09/17/17 11:40 AM  Result Value Ref Range Status   Enterococcus species NOT DETECTED NOT DETECTED Final    Comment: CRITICAL RESULT CALLED TO, READ BACK BY AND VERIFIED WITH: L SEAY PHARMD 09/19/17 2343 JDW    Listeria monocytogenes NOT DETECTED NOT DETECTED Final   Staphylococcus species NOT DETECTED NOT DETECTED Final   Staphylococcus aureus NOT DETECTED NOT DETECTED Final   Streptococcus species NOT DETECTED NOT DETECTED Final   Streptococcus agalactiae NOT DETECTED NOT DETECTED Final   Streptococcus pneumoniae NOT DETECTED NOT DETECTED Final   Streptococcus pyogenes NOT DETECTED NOT DETECTED Final   Acinetobacter baumannii NOT  DETECTED NOT DETECTED Final   Enterobacteriaceae species NOT DETECTED NOT DETECTED Final   Enterobacter cloacae complex NOT DETECTED NOT DETECTED Final   Escherichia coli NOT DETECTED NOT DETECTED Final   Klebsiella oxytoca NOT DETECTED NOT DETECTED Final   Klebsiella pneumoniae NOT DETECTED NOT DETECTED Final   Proteus species NOT DETECTED NOT DETECTED Final   Serratia marcescens NOT DETECTED NOT DETECTED Final   Haemophilus influenzae NOT DETECTED NOT DETECTED Final   Neisseria meningitidis NOT DETECTED NOT DETECTED Final   Pseudomonas aeruginosa NOT DETECTED NOT DETECTED Final   Candida albicans NOT DETECTED NOT DETECTED Final   Candida glabrata NOT DETECTED NOT DETECTED Final   Candida krusei NOT DETECTED NOT DETECTED Final   Candida parapsilosis NOT DETECTED NOT DETECTED Final   Candida tropicalis NOT DETECTED NOT DETECTED Final    Comment: Performed at [facility], [place] [address] [place], [place] [address]  Culture, blood (Routine X 2) w Reflex to ID Panel     Status: None (Preliminary result)   Collection Time: 09/21/17  6:55 AM  Result Value Ref Range Status   Specimen Description BLOOD RIGHT HAND  Final   Special Requests   Final    BOTTLES DRAWN AEROBIC ONLY Blood Culture results may not be optimal due to an inadequate  volume of blood received in culture bottles   Culture   Final    NO GROWTH 1 DAY Performed at [facility], [place] [address], [place], [place] [address]    Report Status PENDING  Incomplete    Welford Roche, [facility] [place] [phone] pager   [phone] cell 2017-10-17, 8:43 AM

## 2017-10-19 NOTE — Progress Notes (Signed)
CRITICAL VALUE ALERT  Critical Value:  Trop I=  0.08, Lactic acid = 12.5  Date & Time Notied:  2017/10/19 @ 0616  Provider Notified: E-link RN Gretchen  Orders Received/Actions taken: not at this time

## 2017-10-19 NOTE — Progress Notes (Signed)
Patient ID: Pam Fuller, female   DOB: 1933-04-12, 84 y.o.   MRN: [id number]    13 Days Post-Op  Subjective: Pt minimally to nonresponsive on vent with no sedation.  NGT hooked back to suction with bloody thick output.  Lactic acid up to 12.5 overnight.  Objective: Vital signs in last 24 hours: Temp:  [92.4 F (33.6 C)-97.6 F (36.4 C)] 97.3 F (36.3 C) (05/06 0725) Pulse Rate:  [61-180] 103 (05/06 0755) Resp:  [13-32] 20 (05/06 0800) BP: (47-178)/(27-108) 113/76 (05/06 0800) SpO2:  [40 %-100 %] 88 % (05/06 0755) FiO2 (%):  [30 %-60 %] 60 % (05/06 0800) Weight:  [66.5 kg (146 lb 9.7 oz)] 66.5 kg (146 lb 9.7 oz) (05/06 0400) Last BM Date: 09/22/17  Intake/Output from previous day: 05/05 0701 - 05/06 0700 In: 3182.2 [I.V.:1926.1; NG/GT:395.3; IV Piggyback:860.8] Out: 1550  Intake/Output this shift: Total I/O In: 854.4 [I.V.:214.4; IV Piggyback:640] Out: -2   PE: Gen: essentially nonresponsive on vent without sedation Abd: tense, 2 drains in place with bilious output.  Hypoactive BS.  NGT in place with bloody tinged old TFs type output.  Lab Results:  Recent Labs    09/22/17 0324 2017/10/01 0349  WBC 21.1* 18.7*  HGB 9.6* 10.0*  HCT 29.9* 32.4*  PLT 66* 64*   BMET Recent Labs    09/22/17 1600 Oct 01, 2017 0349  NA 134* 133*  K 4.5 5.9*  CL 101 103  CO2 21* 15*  GLUCOSE 193* 91  BUN 20 15  CREATININE 0.64 0.46  CALCIUM 8.8* 8.6*   PT/INR No results for input(s): LABPROT, INR in the last 72 hours. CMP     Component Value Date/Time   NA 133 (L) 10/01/17 0349   NA 142 02/12/2017 1400   K 5.9 (H) 10/01/2017 0349   K 3.9 02/12/2017 1400   CL 103 10-01-2017 0349   CO2 15 (L) 2017/10/01 0349   CO2 30 (H) 02/12/2017 1400   GLUCOSE 91 Oct 01, 2017 0349   GLUCOSE 88 02/12/2017 1400   BUN 15 Oct 01, 2017 0349   BUN 16.9 02/12/2017 1400   CREATININE 0.46 Oct 01, 2017 0349   CREATININE 6.2 (HH) 02/12/2017 1400   CALCIUM 8.6 (L) 10/01/17 0349   CALCIUM 9.2  02/12/2017 1400   PROT 5.9 (L) 10-01-17 0349   PROT 6.8 02/12/2017 1400   ALBUMIN 1.7 (L) 10/01/2017 0349   ALBUMIN 3.3 (L) 02/12/2017 1400   AST 398 (H) Oct 01, 2017 0349   AST 13 02/12/2017 1400   ALT 322 (H) 10-01-17 0349   ALT 7 02/12/2017 1400   ALKPHOS 414 (H) 10-01-17 0349   ALKPHOS 75 02/12/2017 1400   BILITOT 24.2 (HH) 01-Oct-2017 0349   BILITOT 0.68 02/12/2017 1400   GFRNONAA >60 10/01/2017 0349   GFRAA >60 10/01/2017 0349   Lipase     Component Value Date/Time   LIPASE 36 09/02/2017 1618       Studies/Results: No results found.  Anti-infectives: Anti-infectives (From admission, onward)   Start     Dose/Rate Route Frequency Ordered Stop   09/20/17 1000  anidulafungin (ERAXIS) 100 mg in sodium chloride 0.9 % 100 mL IVPB     100 mg 78 mL/hr over 100 Minutes Intravenous Every 24 hours 09/20/17 0845     09/18/17 1100  anidulafungin (ERAXIS) 100 mg in sodium chloride 0.9 % 100 mL IVPB  Status:  Discontinued     100 mg 78 mL/hr over 100 Minutes Intravenous Every 24 hours 09/17/17 0858 09/19/17 1130   09/17/17  1400  meropenem (MERREM) 1 g in sodium chloride 0.9 % 100 mL IVPB  Status:  Discontinued     1 g 200 mL/hr over 30 Minutes Intravenous Every 12 hours 09/17/17 1344 09/19/17 1130   09/17/17 1100  anidulafungin (ERAXIS) 200 mg in sodium chloride 0.9 % 200 mL IVPB     200 mg 78 mL/hr over 200 Minutes Intravenous  Once 09/17/17 0858 09/17/17 1329   09/08/17 2000  piperacillin-tazobactam (ZOSYN) IVPB 3.375 g  Status:  Discontinued     3.375 g 100 mL/hr over 30 Minutes Intravenous Every 6 hours 09/08/17 1946 09/16/17 1151   09/05/17 2000  piperacillin-tazobactam (ZOSYN) IVPB 3.375 g     3.375 g 12.5 mL/hr over 240 Minutes Intravenous Every 12 hours 09/05/17 1126 09/07/17 0154   08/31/17 0800  piperacillin-tazobactam (ZOSYN) IVPB 3.375 g  Status:  Discontinued     3.375 g 100 mL/hr over 30 Minutes Intravenous Every 6 hours 08/31/17 0739 09/05/17 1126    08/30/17 2000  Ampicillin-Sulbactam (UNASYN) 3 g in sodium chloride 0.9 % 100 mL IVPB  Status:  Discontinued     3 g 200 mL/hr over 30 Minutes Intravenous Every 24 hours 08/28/2017 0953 08/30/17 0945   08/30/17 1230  piperacillin-tazobactam (ZOSYN) IVPB 3.375 g  Status:  Discontinued     3.375 g 12.5 mL/hr over 240 Minutes Intravenous Every 12 hours 08/30/17 1134 08/31/17 0738   09/07/2017 1030  Ampicillin-Sulbactam (UNASYN) 3 g in sodium chloride 0.9 % 100 mL IVPB     3 g 200 mL/hr over 30 Minutes Intravenous  Once 09/13/2017 0953 08/25/2017 1246   08/30/2017 0000  ampicillin-sulbactam (UNASYN) 1.5 g in sodium chloride 0.9 % 100 mL IVPB  Status:  Discontinued     1.5 g 200 mL/hr over 30 Minutes Intravenous Once 08/28/17 1045 08/27/2017 0928   09/15/2017 2200  piperacillin-tazobactam (ZOSYN) IVPB 3.375 g    Note to Pharmacy:  Zosyn 3.375 g IV q12h in ESRD on HD   3.375 g 12.5 mL/hr over 240 Minutes Intravenous Every 12 hours 09/09/2017 1155 08/25/17 1335   08/22/17 1000  piperacillin-tazobactam (ZOSYN) IVPB 3.375 g  Status:  Discontinued    Note to Pharmacy:  Zosyn 3.375 g IV q12h in ESRD on HD   3.375 g 12.5 mL/hr over 240 Minutes Intravenous Every 12 hours 08/22/17 0128 09/15/2017 1155   08/22/17 0045  piperacillin-tazobactam (ZOSYN) IVPB 3.375 g     3.375 g 12.5 mL/hr over 240 Minutes Intravenous  Once 08/22/17 0038 08/22/17 0145       Assessment/Plan Septic shock -severely acidotic this morning and really not doing well.  Lactic acid up to 12.5.  K is 5.9 despite CRRT.  Patient is still a partial code, but family does not want to pursue further procedures.  I think this is appropriate, but patient has a grim prognosis.  D/W Dr. Nelda Marseille this morning.  He will evaluate the patient and discuss with family. Acute cholecystitis s/p lap chole4/6/19 by Dr. Redmond Pulling - CT scan 4/8 w/ small amount fluid around liver and layering in pelvis ; HIDA 4/9 positive for bile leak - CT 4/21 with multiple fluid  collections -additional drain placed 4/24; Cx negative -ERCP 4/23 w stent placement (Dr Fuller Plan); erosive gastritis noted - continue PPI - continue drains - Dr Lake Bells with CCM - family wishes to do no more procedures & follow  Shock liver Hyperbilirubinemia - drains stable, suspect 2/2 prolonged TNA/cholestasis.   Leukocytosis- down from 21 to 18 today  Severe protein-calorie malnutrition- TNA, tried TFs, but when she got very sick overnight, her NGT was placed to LIWS with blood tinged old TF type output.  Would not resume TFs and keep to suction for now  VDRF secondary to aspiration/PNA-PNA resolved andCCMfollowing - appreciate assistance.  - s/p trach 4/28on full vent support.  weaning  NSTEMI4/12 LUE DVT4/17  ABL anemia- hgb/hct9.3/29.7; s/p 1 unit PRBC and 1 unit platelets4/30 Thrombocytopenia -40,000   ID -zosyn 4/4-4/7,Unasyn 4/11-4/12, Zosyn 4/12 -4/19, Zosyn 4/21-4/29, Eraxis 4/30 >>.  Consider restarting Zosyn if family wants to pursue further care  FEN -NPO/NGT to LIWS.   VTE -SCDs Foley -none, anuric Follow up -Dr. Redmond Pulling    LOS: 32 days    Henreitta Cea , [facility] Oct 16, 2017, 8:28 AM Pager: [phone]

## 2017-10-19 NOTE — Progress Notes (Addendum)
[facility] Progress Note Patient Name: Pam Fuller DOB: [birth year] MRN: [id number]   Date of Service  10/19/2017  HPI/Events of Note  Bradycardia and Hypotension - BP = 47/35 and HR = 44.   eICU Interventions  Will order: 1. Atropine 0.5 mg IV X 1.  2. Cycle Troponin. 3. Vasopressin IV infusion at shock dose.  4. ABG STAT. 5. 12 Lead EKG STAT. 6. Ground team notified of acute decompensation.         Biff Rutigliano Cornelia Copa 2017-10-19, 4:27 AM

## 2017-10-19 NOTE — Progress Notes (Signed)
[facility] Progress Note    Subjective:   Overnight pt has become more acidotic despite CVVHD and IV bicarb as well as hypotensive requiring escalation of pressors. Family has discussed this with Dr. Nelda Marseille and do not want to pursue further procedures.  Will await family to arrive and will stop CVVHD at that time and transition to comfort measures.   Objective:   BP 96/74   Pulse (!) 103   Temp (!) 97.3 F (36.3 C) (Axillary)   Resp 15   Ht _0  (1.575 m)   Wt 66.5 kg (146 lb 9.7 oz)   SpO2 (!) 88% Comment: poor perfusion and waveform  BMI 26.81 kg/m   Intake/Output: I/O last 3 completed shifts: In: 4002.3 [I.V.:2746.2; NG/GT:395.3; IV Piggyback:860.8] Out: 1876 [Emesis/NG output:140; Drains:90; GTXMI:6803]   Intake/Output this shift:  Total I/O In: 1242.6 [I.V.:602.6; IV Piggyback:640] Out: 298 [Emesis/NG output:300] Weight change: -0.2 kg (-7.1 oz)  Physical Exam: On vent, not responsive trach in place No jvd, +scleral icterus Chest coarse bilat RRR no mrg abd soft ntnd  abd drains in place Ext 1-2+ edema upper extremities nf not following commands eyes open lua avg w bruit  Labs: BMET Recent Labs  Lab 09/20/17 0417 09/20/17 1645 09/21/17 0409 09/21/17 1515 09/22/17 0324 09/22/17 1600 10/23/17 0349  NA 137 135 134* 133* 132* 134* 133*  K 4.4 4.1 4.2 4.3 4.3 4.5 5.9*  CL 104 102 101 100* 100* 101 103  CO2 _1 21* 15*  GLUCOSE 174* 255* 135* 144* 133* 193* 91  BUN 42* 40* 31* 27* 23* 20 15  CREATININE 0.67 0.62 0.58 0.62 0.64 0.64 0.46  ALBUMIN 2.2* 2.0* 1.9* 1.8* 1.8* 1.7* 1.7*  CALCIUM 9.1 8.9 9.1 8.9 8.6* 8.8* 8.6*  PHOS 3.7 3.7 3.6 4.0 3.8 4.4 5.4*   CBC Recent Labs  Lab 09/19/17 0355 09/20/17 0417 09/21/17 0409 09/22/17 0324 2017/10/23 0349  WBC 15.5* 16.9* 19.8* 21.1* 18.7*  NEUTROABS 14.4*  --   --   --   --   HGB 9.3* 9.5* 9.7* 9.6* 10.0*  HCT 29.7* 29.8* 29.8* 29.9* 32.4*  MCV 99.7 100.0 100.0 99.0 103.8*   PLT 40* 40* 50* 66* 64*    _2 @ Medications:    . chlorhexidine gluconate (MEDLINE KIT)  15 mL Mouth Rinse BID  . Chlorhexidine Gluconate Cloth  6 each Topical Daily  . darbepoetin (ARANESP) injection - DIALYSIS  150 mcg Subcutaneous Q Mon-1800  . doxercalciferol  2 mcg Intravenous Q M,W,F-1800  . feeding supplement (VITAL HIGH PROTEIN)  1,000 mL Per Tube Q24H  . hydrocortisone sod succinate (SOLU-CORTEF) inj  50 mg Intravenous Q6H  . insulin aspart  0-9 Units Subcutaneous Q4H  . mouth rinse  15 mL Mouth Rinse QID  . pantoprazole (PROTONIX) IV  40 mg Intravenous Q12H  . sodium chloride flush  10-40 mL Intracatheter Q12H   [facility]h 87mn  61.5kg   2/2.25 bath p4  AVG  Heparin 4000 - hect 2 ug q hd iv - mircera 30 ug every 2 wks iv at dialysis     Assessment/ Plan:   1. Lactic acidosis with worsening acidemia despite CVVHD and IV Bicarb as well as more hemodynamically unstable.  Likely developed perf viscous.  Family not wanting to pursue further procedures. 2. Liver failure with rising tbili presumably shock liver with underlying cirrhosis. 3. Biloma/intraperitoneal abscesses following lap chole 08/28/2017 for acute cholecystitis. 4. VDRF s/p trach with ARDS  5. ESRD has been on CVVHD 6. 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 Hypotension/shock- on levophed, vasopressin drip c/b NSTEMI due to demand ischemia. 9. Disposition- poor overall prognosis and in light of worsening lactic acidosis, she likely sustained a catastrophic intra-abdominal event.  Agree with Dr. Nelda Marseille and family decision to stop CVVHD and transition to comfort measures so she can pass in peace.   Donetta Potts, MD [facility] Pager [phone] 2017/10/15, 10:16 AM

## 2017-10-19 NOTE — Progress Notes (Addendum)
[facility] Progress Note Patient Name: Pam Fuller DOB: 1933-05-02 MRN: [id number]   Date of Service  13-Oct-2017  HPI/Events of Note  Lactic Acid = 12.5 - BP = 85/59 with MAP = 68. Hgb = 10.0. CVP = ?. NaHCO3 added to CRRT fluid by Nephrology.   eICU Interventions  Will order: 1. CVP monitoring now and Q 4 hours.     Intervention Category Major Interventions: Acid-Base disturbance - evaluation and management  Ernie Kasler [place] 2017-10-13, 6:28 AM

## 2017-10-19 NOTE — Progress Notes (Signed)
Nutrition Follow-up   Nutrition Brief Note  RD consulted for TF management, TF stopped overnight due to bloody output. Per RN note pt actively dying. Per MD note family does not want any further procedures and would like to transition to comfort measures. No appropriate nutrition interventions at this time. If patient status changes re-consult RD.   Hope Budds, Dietetic Intern

## 2017-10-19 NOTE — Progress Notes (Addendum)
[facility] P[facility]M PROGRESS NOTE  S: 84 y/o female with ESRD on HD, with history of anemia, breast cancer here with acute cholecystitis.  S/p lap chole complicated by bile leak.  Prolonged ICU stay.  Now complicated by fungemia. She is on CRRT. Recent family discussions have changed code status to ACLS drugs only. In the early AM hours of 5/6 she developed bradycardia promptly followed by worsening shock. HR improved with atropine via Elink MD. I was called to bedside to assess. MAP in the 40s upon my arrival. On 39mcg of levophed. 2 amps bicarb ordered and BP improved. ABG confirmed profound respiratory acidosis.   O: BP (!) 47/35   Pulse 65   Temp (!) 97.2 F (36.2 C) (Oral)   Resp 20   Ht 5\' 2"  (1.575 m)   Wt 66.5 kg (146 lb 9.7 oz)   SpO2 100%   BMI 26.81 kg/m    ABG    Component Value Date/Time   PHART 7.206 (L) 09-25-17 0435   PCO2ART 27.2 (L) Sep 25, 2017 0435   PO2ART 104 2017-09-25 0435   HCO3 10.5 (L) Sep 25, 2017 0435   TCO2 16 (L) 09/09/2017 0952   ACIDBASEDEF 16.0 (H) 09-25-17 0435   O2SAT 97.2 09-25-17 0435     General:  Cachectic elderly female Neuro:  Comatose HEENT:  [place]/AT, PERRL, no JVD Cardiovascular:  RRR, no MRG Lungs:  Clear vent supported breaths Abdomen:  Soft, non-tender, non-distended. Hypoactive BS. Drains in place.  Musculoskeletal:  No acute deformity Skin:  Intact, MMM   A/P: Shock presumably septic in the setting of fungemia, peritonitis. Refractory - Add vasopressin shock dose - Small fluid challenge 500cc NS - Correct acid base  Metabolic acidosis - Bicarb amp bolus x 2.  - Start bicarb infusion - BMP, lactic pending.   Acute respiratory failure - continue current vent settings.  ESRD on CRRT - CRRT to keep even.   Global: family discussions ongoing.  Critical care time 31 mins  Georgann Housekeeper, ACNP [facility] Pulmonology/Critical Care Pager [phone] or [phone]  Addendum: BP improved after Bicarb administration. Now weaning  levophed.  Labs still pending to better understand acidosis. Suspect this will prove to be lactic acidosis.

## 2017-10-19 NOTE — Progress Notes (Signed)
100 ml fentanyl and one 166ml morphine drip wasted in sink. Witnessed by [employer]

## 2017-10-19 NOTE — Progress Notes (Signed)
Son brought his brother in, I again went through the whole case.  Explained that she likely has ischemic bowel and refractory shock at this point.  She is too ill to take to a CT scan let alone surgery.  They expressed they do not wish for further aggressive interventions but they are reluctant to stop active treatment.  After a long discussion, decision was made to keep patient on the vent but start morphine and d/c CRRT and levophed and allow her to pass in peace.  Also discussed code status, will make a full DNR but no disconnect from vent.  Bedside RN informed of plan, will start morphine now while rest of the family gets here then will begin the process.  The patient is critically ill with multiple organ systems failure and requires high complexity decision making for assessment and support, frequent evaluation and titration of therapies, application of advanced monitoring technologies and extensive interpretation of multiple databases.   Critical Care Time devoted to patient care services described in this note is  45  Minutes. This time reflects time of care of this signee Dr Jennet Maduro. This critical care time does not reflect procedure time, or teaching time or supervisory time of PA/NP/Med student/Med Resident etc but could involve care discussion time.  Rush Farmer, M.D. [facility] Pulmonary/Critical Care Medicine. Pager: [phone]. After hours pager: [phone].

## 2017-10-19 NOTE — Progress Notes (Signed)
[facility] Progress Note Patient Name: Pam Fuller DOB: 1933-04-27 MRN: [id number]   Date of Service  Sep 29, 2017  HPI/Events of Note  Hypotension and lactic acidosis - BP = 86/34 with MAP = 49. CVP = 2.   eICU Interventions  Will order: 1. Bolus with 0.9 NaCl 1 liter IV over 61 minutes now.      Intervention Category Major Interventions: Hypovolemia - evaluation and treatment with fluids;Hypotension - evaluation and management  Lysle Dingwall 2017-09-29, 6:36 AM

## 2017-10-19 NOTE — Death Summary Note (Signed)
DEATH SUMMARY   Patient Details  Name: Pam Fuller MRN: [id number] DOB: [birth year]  Admission/Discharge Information   Admit Date:  2017/09/20  Date of Death: Date of Death: 2017/10/23  Time of Death: Time of Death: 09-16-10  Length of Stay: 09/16/30  Referring Physician: No primary care provider on file.   Reason(s) for Hospitalization  84 y/o female with a prolonged hospitalization post lap chole who had a bile leak, multiple abdominal fluid collections, aspiration pneumonia, respiratory failure and ERSD on CVVHD.   Diagnoses  Preliminary cause of death:   Refractory septic shock  Secondary Diagnoses (including complications and co-morbidities):  Principal Problem:   Bile leak, delayed, s/p ERCP/stent 09/11/2017 Active Problems:   Essential hypertension   GERD (gastroesophageal reflux disease)   Acute cholecystitis s/p lap cholecystectomy 08/27/2017   Acute respiratory failure (HCC)   Aspiration into airway   ESRD on hemodialysis (HCC)   Protein-calorie malnutrition, severe (HCC)   Septic shock (HCC)   NSTEMI (non-ST elevated myocardial infarction) ([place])   Shock liver   Coagulopathy (HCC)   Bile peritonitis (HCC)   UGIB (upper gastrointestinal bleed)   Pressure injury of skin   Fungemia   Abdominal wall abscess   Generalized abdominal pain   Goals of care, counseling/discussion   Palliative care by specialist   Malnutrition of moderate degree   Tracheostomy status ([place])   Acute respiratory failure with hypoxemia [facility])   Palliative care encounter   Brief Hospital Course (including significant findings, care, treatment, and services provided and events leading to death)  84 y/o female with a prolonged hospitalization post lap chole who had a bile leak, multiple abdominal fluid collections, aspiration pneumonia, respiratory failure and ERSD on CVVHD.  Son brought his brother in, I again went through the whole case.  Explained that she likely has ischemic bowel and refractory  shock at this point.  She is too ill to take to a CT scan let alone surgery.  They expressed they do not wish for further aggressive interventions but they are reluctant to stop active treatment.  After a long discussion, decision was made to keep patient on the vent but start morphine and d/c CRRT and levophed and allow her to pass in peace.  Also discussed code status, will make a full DNR but no disconnect from vent.  Bedside RN informed of plan, will start morphine now while rest of the family gets here then will begin the process.  Patient was started and morphine, pressors stopped for her to expire shortly thereafter with the family bedside.   Pertinent Labs and Studies  Significant Diagnostic Studies Ct Abdomen Pelvis Wo Contrast  Result Date: 09/08/2017 CLINICAL DATA:  Elevated lactic acid. Bloody stools. Status post cholecystectomy August 24, 2017. History of breast cancer, end-stage renal disease on dialysis. EXAM: CT ABDOMEN AND PELVIS WITHOUT CONTRAST TECHNIQUE: Multidetector CT imaging of the abdomen and pelvis was performed following the standard protocol without IV contrast. COMPARISON:  CT abdomen and pelvis September 03, 2017. FINDINGS: LOWER CHEST: Multifocal lung base consolidation improved from prior examination. Heart size is normal. Moderate coronary artery calcifications. No pericardial effusion. HEPATOBILIARY: Nodular hepatic contour. Marked interval increase in size large perihepatic heterogeneous fluid collection largest component measuring 16.7 x 6.8 cm (24 Hounsfield units). Fluid collection is contiguous with gallbladder fossa, status post cholecystectomy. PANCREAS: Normal. SPLEEN: Large perisplenic fluid collection. ADRENALS/URINARY TRACT: Kidneys are orthotopic, extremely atrophic. Bilateral renal cysts measuring to 16 mm on the RIGHT. No nephrolithiasis, hydronephrosis; limited assessment  for renal masses on this nonenhanced examination. The unopacified ureters are normal in course  and caliber. Urinary bladder is partially distended and unremarkable. Normal adrenal glands. STOMACH/BOWEL: Nasogastric tube tip terminates in mid stomach. The stomach, small and large bowel are normal in course and caliber without inflammatory changes, sensitivity decreased by lack of enteric contrast. Moderate colonic diverticulosis. VASCULAR/LYMPHATIC: Aortoiliac vessels are normal in course and caliber. Mild calcific atherosclerosis. LEFT femoral central venous catheter placed. REPRODUCTIVE: Status post hysterectomy. 10 mm LEFT adnexal cyst, no routine indicated follow-up. OTHER: RIGHT upper quadrant pigtail drainage CT catheter posteriorly abutting and possibly within the hepatic fluid collection. Multiple heterogeneous intraperitoneal fluid collections along the anterior abdominal wall largest measuring 13.4 x 5.6 cm which appears to communicate with the hepatic collection. Small volume ascites within pelvis. MUSCULOSKELETAL: Non-acute. Osteopenia. Multiple old mild compression fractures and old Schmorl's nodes. Anterior abdominal wall scarring. IMPRESSION: 1. Marked interval growth of large perihepatic and perisplenic fluid collections with multiple new intraperitoneal fluid collections compatible with abscess, less likely hematoma. RIGHT pigtail drainage catheter abutting perihepatic fluid collection. 2. Status post cholecystectomy. 3. Resolving multifocal pneumonia. 4. These results will be called to the ordering clinician or representative by the Radiologist Assistant, and communication documented in the PACS or zVision Dashboard. Electronically Signed   By: Elon Alas M.D.   On: 09/08/2017 18:57   Ct Abdomen Pelvis Wo Contrast  Result Date: 09/03/2017 CLINICAL DATA:  84 year old female with a history of biloma. EXAM: CT ABDOMEN AND PELVIS WITHOUT CONTRAST TECHNIQUE: Multidetector CT imaging of the abdomen and pelvis was performed following the standard protocol without IV contrast. COMPARISON:   Chest x-ray 09/03/2017, nuclear medicine study 09/01/2017, prior CT 08/26/2017, 08/28/2017 FINDINGS: Lower chest: Nodular airspace opacity of right middle lobe, bilateral lower lobes, similar distribution to prior CT and plain film studies. No pleural effusion. Native coronary calcifications. Hepatobiliary: Liver similar to prior studies. There is trace fluid remaining on the dome of the liver, though significantly decreased in volume from the CT dated 08/26/2017. Pigtail drainage catheter terminates adjacent to segment 6 of the liver. Residual thin fluid within the gallbladder fossa with surgical clips from prior cholecystectomy. Fluid measures 7.2 cm transversely and approximately 17 mm cranial caudal. No intrahepatic or extrahepatic biliary ductal dilatation. Pancreas: Unremarkable pancreas. There is rounded tissue inseparable from the distal pancreatic tail, adjacent to the spleen. The tissue is similar density of spleen tissue. Overall, this focus of tissue is unchanged dating to 2011, and is most likely accessory splenic tissue/splenule. Spleen: Unremarkable spleen Adrenals/Urinary Tract: Unremarkable adrenal glands. Bilateral kidneys atrophic. Low-density lesions, unchanged over the course of several CT studies dating to 2011. None of these lesions are completely characterized on the current CT. Stomach/Bowel: Enteric contrast within stomach, small bowel, extending to the colon. No extraluminal contrast identified. No transition point. No dilated small bowel or colon. Questionable wall thickening of the length of the colon, poorly characterized with the exclusion of IV contrast. Colonic diverticular change without definite associated inflammatory changes. Gastric tube terminates within the stomach. Vascular/Lymphatic: Calcifications of the abdominal aorta and bilateral iliac arteries. Periaortic lymph nodes, none of which are enlarged. No pelvic adenopathy. No mesenteric adenopathy. Reproductive: Hysterectomy  Other: Dependent low-density fluid within the pelvis without evidence of loculation. Volume is unchanged from the comparison CT. Interval resolution of peritoneal air. Stranding of the mesenteric fat associated with small bowel and colon. Stranding of the abdominal wall fat/edema. Musculoskeletal: No acute displaced fracture. Multilevel degenerative changes of the visualized thoracolumbar  spine. Degenerative changes of the hips. IMPRESSION: Pigtail drainage catheter at the inferior liver margin, with overall decreased volume of perihepatic fluid in this patient with a history of biloma. There is trace retained fluid in the gallbladder fossa, status post cholecystectomy. Similar volume of left subdiaphragmatic low-density fluid, as well as low-density fluid layered within the dependent pelvis. Neither of these collections appear loculated, potentially representing reactive ascites or additional sites of bile accumulation, less likely abscess. Questionable colonic wall thickening throughout the length of the colon. This may be seen with anasarca/edema and a positive fluid status, or alternatively could represent inflammatory/infectious colitis. No evidence of bowel obstruction. Multifocal airspace disease of the lung bases, concerning for infection. Mesenteric and body wall anasarca/edema. Aortic Atherosclerosis (ICD10-I70.0). Electronically Signed   By: Corrie Mckusick D.O.   On: 09/03/2017 17:36   Ct Abdomen Pelvis W Contrast  Result Date: 09/18/2017 CLINICAL DATA:  Abdominal distention.  Infection. EXAM: CT ABDOMEN AND PELVIS WITH CONTRAST TECHNIQUE: Multidetector CT imaging of the abdomen and pelvis was performed using the standard protocol following bolus administration of intravenous contrast. CONTRAST:  132mL OMNIPAQUE IOHEXOL 300 MG/ML  SOLN COMPARISON:  09/08/2017 FINDINGS: Lower chest: Patchy airspace disease in the lung bases is similar to prior. Hepatobiliary: Interval evolution of the large subcapsular  fluid collection around the liver. A dominant component along the lateral segment left liver has decreased in the interval with a percutaneous pigtail catheter coiled in the region on today's study, new in the interval. Mild periportal edema without gross intra hepatic biliary duct dilatation. A common bile duct stent is identified with mild distention of the common duct up to about 7 mm. Pancreas: No focal mass lesion. No dilatation of the main duct. No intraparenchymal cyst. No peripancreatic edema. Spleen: Interval development of segmental areas of subcapsular hypo perfusion in the spleen, mainly anteriorly and inferiorly suggesting infarcts. Small perisplenic fluid collection persists. Adrenals/Urinary Tract: No adrenal nodule or mass. Kidneys are atrophic bilaterally with small bilateral hypoattenuating renal lesions, likely cysts. No hydroureteronephrosis. Bladder is decompressed. Stomach/Bowel: NG tube is identified in the stomach with the tip in the antral region. Stomach is decompressed. Duodenum is normally positioned as is the ligament of Treitz. No small bowel wall thickening. No small bowel dilatation. The terminal ileum is normal. The appendix is not visualized, but there is no edema or inflammation in the region of the cecum. Diverticular changes are seen diffusely along the colon with circumferential low-attenuation wall thickening in the splenic flexure (see image 32/series 3 and image 40/series 3). Colon is diffusely decompressed. Vascular/Lymphatic: There is abdominal aortic atherosclerosis without aneurysm. Portal vein and superior mesenteric vein are patent. There is no gastrohepatic or hepatoduodenal ligament lymphadenopathy. No intraperitoneal or retroperitoneal lymphadenopathy. No pelvic sidewall lymphadenopathy. Reproductive: There is no adnexal mass. Other: Small volume fluid identified in the para colic gutters bilaterally. Anterior peritoneal collection measured previously at 13.4 x 5.6  cm is not substantially changed, measuring 12.4 x 5.0 cm today. Free fluid is identified in the cul-de-sac were there is subtle associated peritoneal enhancement. As above, right paramidline sure cutaneous pigtail catheter has the distal tip coiled adjacent to the lateral segment of the left liver, just anterior to the stomach. Fluid collection in this region is decreased in the interval. A second percutaneous catheter on the right with the tip coiled posterior to the right lobe of the liver is in stable position with no substantial change in the adjacent fluid collection. Small volume of gas in this  fluid collection is new in the interval in compatible with the presence of the drain. Musculoskeletal: Bone windows reveal no worrisome lytic or sclerotic osseous lesions. Superior endplate compression deformity at L1 and L2 is similar to prior. IMPRESSION: 1. Circumferential wall thickening noted in the splenic flexure. Given watershed distribution and the reported clinical history of hypotension, ischemia is a consideration. 2. Interval evolution of the large perihepatic fluid collections. The fluid around the lateral segment left liver has decreased with interval percutaneous drain placement. Collection posterior to the right liver is not substantially changed in the interval with stable position of the drainage catheter. 3. Interval development of subcapsular areas of segmental hypoperfusion in the spleen, suggesting infarcts. Similar volume of perisplenic fluid evident. 4. Dominant apparently loculated fluid collection in the anterior abdomen measures just very minimally smaller in the interval. Electronically Signed   By: Misty Stanley M.D.   On: 09/18/2017 12:48   Dg Chest Port 1 View  Result Date: 09/18/2017 CLINICAL DATA:  Tracheostomy patient, dialysis dependent renal failure, septic shock EXAM: PORTABLE CHEST 1 VIEW COMPARISON:  Portable chest x-ray of September 17, 2017 FINDINGS: The lungs remain borderline  hypoinflated. The interstitial markings are coarse but stable. There is no alveolar infiltrate. The heart and pulmonary vascularity are normal. There is calcification in the wall of the aortic arch. The tracheostomy tube tip projects at the inferior margin of the clavicular heads. The right internal jugular venous catheter tip projects over the midportion of the SVC. The esophagogastric tube tip in proximal port project in below the GE junction. There a pigtail catheters bilaterally in the abdomen which are stable. IMPRESSION: Stable appearance of the chest.  No pneumonia nor CHF. Thoracic aortic atherosclerosis. Electronically Signed   By: David  Martinique M.D.   On: 09/18/2017 09:42   Dg Chest Port 1 View  Result Date: 09/17/2017 CLINICAL DATA:  Tracheostomy tube. EXAM: PORTABLE CHEST 1 VIEW COMPARISON:  09/16/2017. FINDINGS: Tracheostomy tube, right IJ line, NG to in stable position. Ureteral stents appear to be in stable position. Left mid lung field and bibasilar subsegmental atelectasis and/or scarring. No pleural effusion or pneumothorax. Heart size normal. Degenerative change thoracic spine. Surgical clips left chest. IMPRESSION: 1.  Lines and tubes in stable position. 2. Mild left mid lung field and bibasilar subsegmental atelectasis and/or scarring. Electronically Signed   By: Marcello Moores  Register   On: 09/17/2017 09:24   Dg Chest Port 1 View  Result Date: 09/16/2017 CLINICAL DATA:  Tracheostomy. EXAM: PORTABLE CHEST 1 VIEW COMPARISON:  Radiograph of September 15, 2017. FINDINGS: The heart size and mediastinal contours are within normal limits. Atherosclerosis of thoracic aorta is noted. Tracheostomy tube is in grossly good position. Right internal jugular catheter is again noted with tip in expected position of the SVC. Nasogastric tube is seen entering the stomach. No pneumothorax is noted. Minimal bibasilar subsegmental atelectasis is noted. The visualized skeletal structures are unremarkable. IMPRESSION:  Tracheostomy tube in good position. Stable minimal bibasilar subsegmental atelectasis. Aortic Atherosclerosis (ICD10-I70.0). Electronically Signed   By: Marijo Conception, M.D.   On: 09/16/2017 07:32   Dg Chest Port 1 View  Result Date: 09/15/2017 CLINICAL DATA:  Tracheostomy status EXAM: PORTABLE CHEST 1 VIEW COMPARISON:  09/15/2017 600 hours FINDINGS: The endotracheal tube has been exchanged for a tracheostomy tube. The tip is 5.1 cm from the carina. Low lung volumes. Normal heart size. Bibasilar atelectasis increased. Bilateral nephroureteral catheter is in place. Stable right jugular central venous catheter. NG tube removed.  IMPRESSION: Tracheostomy tube is in place. Bibasilar atelectasis increased Electronically Signed   By: Marybelle Killings M.D.   On: 09/15/2017 12:58   Dg Chest Port 1 View  Result Date: 09/15/2017 CLINICAL DATA:  Endotracheal tube present.  Respiratory failure. EXAM: PORTABLE CHEST 1 VIEW COMPARISON:  09/14/2017 FINDINGS: Endotracheal tube tip has been advanced, now 4.1 cm above the carina. Right jugular central venous catheter and nasogastric tube remain in appropriate position. Heart size is within normal limits. Aortic atherosclerosis. Stable subsegmental atelectasis in right lung base. No evidence of pulmonary consolidation or pleural effusion. IMPRESSION: Endotracheal tube in appropriate position. Stable mild right basilar atelectasis. Electronically Signed   By: Earle Gell M.D.   On: 09/15/2017 09:53   Dg Chest Port 1 View  Result Date: 09/14/2017 CLINICAL DATA:  84 year old female status post intubation EXAM: PORTABLE CHEST 1 VIEW COMPARISON:  Prior chest x-ray 09/13/2017 FINDINGS: The patient remains intubated. The tip of the endotracheal tube is at the level of the clavicles approximately 7 cm above the carina. A nasogastric tube is present. The tip lies off the field of view, presumably in the stomach. A right IJ central venous catheter remains present. The tip overlies the  distal SVC. A pigtail drainage catheter projects over the left upper quadrant. Slightly improved aeration with decreasing atelectasis in the lung bases. Bilateral interstitial airspace opacities persist. Residual linear atelectasis versus scarring in the right middle lobe. No pulmonary edema, pneumothorax or pleural effusion. No new airspace opacity. IMPRESSION: 1. The tip of the endotracheal tube is now 7 cm above the carina. 2. Other support apparatus in stable and satisfactory position. 3. Improved aeration overall secondary to decreasing bibasilar atelectasis. 4. No new acute cardiopulmonary process. Electronically Signed   By: Jacqulynn Cadet M.D.   On: 09/14/2017 09:10   Dg Chest Port 1 View  Result Date: 09/13/2017 CLINICAL DATA:  Airway intubation. EXAM: PORTABLE CHEST 1 VIEW COMPARISON:  09/12/2017. FINDINGS: ETT 2.8 cm above carina. Nasogastric tube is in the stomach. Low lung volumes. Cardiomegaly. Perihilar opacities appear improved. IMPRESSION: Support tubes and apparatus are stable. Slight improvement aeration. Electronically Signed   By: Staci Righter M.D.   On: 09/13/2017 09:00   Dg Chest Port 1 View  Result Date: 09/12/2017 CLINICAL DATA:  Intubation. EXAM: PORTABLE CHEST 1 VIEW COMPARISON:  09/11/2017. FINDINGS: Endotracheal tube, NG tube, right IJ line noted stable position. Heart size normal. Bibasilar atelectasis/infiltrates. Small bilateral pleural effusion scratched it no pleural effusion or pneumothorax. Pigtail catheter is noted over the upper abdomen bilaterally. IMPRESSION: 1. Chest lines and tubes in stable position. Pigtail catheters noted in the upper abdomen abdomen. 2.  Bibasilar atelectasis and/or infiltrates. Electronically Signed   By: Marcello Moores  Register   On: 09/12/2017 09:41   Dg Chest Port 1 View  Result Date: 09/11/2017 CLINICAL DATA:  Endotracheal tube.  Respiratory failure EXAM: PORTABLE CHEST 1 VIEW COMPARISON:  09/07/2017 FINDINGS: Endotracheal tube in good  position. NG tube in the stomach. Right jugular central venous catheter tip cavoatrial junction unchanged. No pneumothorax Improved aeration of the lung. Slightly improved lung volume with improvement in diffuse bilateral airspace disease. No pleural effusion. IMPRESSION: Improved lung volume and improvement in diffuse bilateral airspace disease. Electronically Signed   By: Franchot Gallo M.D.   On: 09/11/2017 11:05   Dg Chest Port 1 View  Result Date: 09/09/2017 CLINICAL DATA:  Status post central line placement EXAM: PORTABLE CHEST 1 VIEW COMPARISON:  Portable chest x-ray of today's date at 5:08  a.m. FINDINGS: The patient has undergone placement of a right internal jugular venous catheter. The tip projects over the junction of the middle and distal thirds of the SVC. The lung volumes remain low. There is no pneumothorax or pleural effusion. The interstitial markings are coarse throughout the right lung and at the left lung base. The endotracheal tube tip projects 3.6 cm above the carina. The esophagogastric tube tip and proximal port project below the inferior margin of the image. IMPRESSION: No postprocedure complication following right internal jugular venous catheter placement. The other support tubes are in reasonable position. Electronically Signed   By: David  Martinique M.D.   On: 08/28/2017 12:10   Dg Chest Port 1 View  Result Date: 09/15/2017 CLINICAL DATA:  Endotracheal tube EXAM: PORTABLE CHEST 1 VIEW COMPARISON:  09/08/2017 FINDINGS: Endotracheal tube and NG tube are unchanged. Low lung volumes, bibasilar atelectasis and vascular congestion. Heart is normal size. No visible effusions. IMPRESSION: Low lung volumes with vascular congestion and bibasilar atelectasis. Electronically Signed   By: Rolm Baptise M.D.   On: 08/31/2017 07:48   Dg Chest Port 1 View  Result Date: 09/08/2017 CLINICAL DATA:  Acute respiratory failure. EXAM: PORTABLE CHEST 1 VIEW COMPARISON:  One day prior FINDINGS:  Endotracheal tube terminates 2.5 cm above carina.Nasogastric tube extends beyond the inferior aspect of the film. Right upper quadrant surgical clips. Normal heart size for level of inspiration. Atherosclerosis in the transverse aorta. No pleural effusion or pneumothorax. Right mid lung and left base airspace disease is not significantly changed. No new pulmonary opacities. IMPRESSION: No change in multifocal bilateral airspace disease, most consistent with pneumonia. Aortic Atherosclerosis (ICD10-I70.0). Electronically Signed   By: Abigail Miyamoto M.D.   On: 09/08/2017 09:03   Dg Chest Port 1 View  Result Date: 09/07/2017 CLINICAL DATA:  Intubated. EXAM: PORTABLE CHEST 1 VIEW COMPARISON:  09/05/2017 FINDINGS: Endotracheal tube in satisfactory position. Nasogastric to side hole in the mid stomach, tip not included. The left jugular catheter has been removed. Normal sized heart. Mild patchy opacity in both lungs with improvement. Left breast surgical clips and cholecystectomy clips. Thoracic spine degenerative changes. IMPRESSION: Improving bilateral pneumonia. Electronically Signed   By: Claudie Revering M.D.   On: 09/07/2017 08:48   Dg Chest Port 1 View  Result Date: 09/05/2017 CLINICAL DATA:  84 year old with endotracheal intubation. EXAM: PORTABLE CHEST 1 VIEW COMPARISON:  09/04/2017 FINDINGS: Endotracheal tube is 4.3 cm above the carina. Nasogastric tube extends into the abdomen but the tip is beyond the image. Patchy airspace disease in the right lung with slightly improved aeration. Persistent patchy densities at the left lung base. Heart size is within normal limits and stable. Atherosclerotic calcifications at the aortic arch. There is a left jugular dialysis catheter and the tip is near the junction of the SVC and left innominate vein. Again noted is a drainage catheter in the right upper abdomen. IMPRESSION: Improved aeration in the right lung with residual airspace disease. Persistent densities at the  left lung base. Dialysis catheter tip is near the junction of the left innominate vein and SVC. Electronically Signed   By: Markus Daft M.D.   On: 09/05/2017 09:09   Dg Chest Port 1 View  Result Date: 09/04/2017 CLINICAL DATA:  Shortness of breath EXAM: PORTABLE CHEST 1 VIEW COMPARISON:  09/03/2017 FINDINGS: Cardiac shadow is stable. Endotracheal tube and nasogastric catheter is well as a left jugular central line are again seen and stable. Diffuse infiltrates are noted bilaterally right greater than  left without significant change. No new focal abnormality is noted. IMPRESSION: Stable bilateral infiltrates. Electronically Signed   By: Inez Catalina M.D.   On: 09/04/2017 08:10   Dg Ercp  Result Date: 09/08/2017 CLINICAL DATA:  84 year old female undergoing ERCP for biliary leak EXAM: ERCP TECHNIQUE: Multiple spot images obtained with the fluoroscopic device and submitted for interpretation post-procedure. FLUOROSCOPY TIME:  Fluoroscopy Time:  0 minutes 45 seconds COMPARISON:  Most recent CT scan of the abdomen and pelvis 09/08/2017 FINDINGS: A total of 3 intraoperative images in a cine clip are submitted for interpretation. The images demonstrate a flexible endoscope in the descending duodenum with deep wire cannulation of the hepatic ducts. Cholangiogram demonstrates a mildly dilated common bile duct with subtle filling defects in the distal common duct. There is extravasation of injected contrast material from the cystic duct remnant. The subsequent images demonstrate placement of plastic biliary stent. A percutaneous drainage catheter is present in the right upper quadrant. IMPRESSION: 1. Biliary leak emanating from the cystic duct. 2. Filling defects within the distal common duct may represent stones and/or sludge. 3. Placement of a plastic biliary stent. These images were submitted for radiologic interpretation only. Please see the procedural report for the amount of contrast and the fluoroscopy time  utilized. Electronically Signed   By: Jacqulynn Cadet M.D.   On: 09/17/2017 14:58   Dg Abd Portable 1v  Result Date: 09/15/2017 CLINICAL DATA:  Feeding tube placement. EXAM: PORTABLE ABDOMEN - 1 VIEW COMPARISON:  CT, 09/08/2017. FINDINGS: Nasogastric tube passes well below the diaphragm. Tip projects in the distal stomach or first portion of the duodenum. Percutaneous pigtail catheters are noted, 1 projecting in the epigastric region the other in the right upper quadrant along the inferior margin of the liver. Biliary stent is noted. IMPRESSION: Well-positioned nasogastric tube. Electronically Signed   By: Lajean Manes M.D.   On: 09/15/2017 18:11   Ir Image Guided Drainage Percut Cath  Peritoneal Retroperit  Result Date: 09/11/2017 INDICATION: Biloma.  Bile leak. EXAM: ABDOMINAL DRAIN PLACEMENT MEDICATIONS: The patient is currently admitted to the hospital and receiving intravenous antibiotics. The antibiotics were administered within an appropriate time frame prior to the initiation of the procedure. ANESTHESIA/SEDATION: Intubated COMPLICATIONS: None immediate. PROCEDURE: Informed written consent was obtained from the patient after a thorough discussion of the procedural risks, benefits and alternatives. All questions were addressed. Maximal Sterile Barrier Technique was utilized including caps, mask, sterile gowns, sterile gloves, sterile drape, hand hygiene and skin antiseptic. A timeout was performed prior to the initiation of the procedure. The epigastric region was prepped and draped in a sterile fashion. 1% lidocaine was utilized for local anesthesia. Under sonographic guidance, a micropuncture needle was inserted into the epigastric biloma. This was removed over a 018 wire which was up sized to an Amplatz. Ten [place] dilator followed by a 10 [place] drain were inserted. It was looped and string fixed. Bilious fluid was aspirated. FINDINGS: Images document 54 French drain placement into an upper  abdominal biloma. IMPRESSION: Successful 10 French biloma drain placement. Electronically Signed   By: Marybelle Killings M.D.   On: 09/11/2017 12:59    Microbiology No results found for this or any previous visit (from the past 240 hour(s)).  Lab Basic Metabolic Panel: No results for input(s): NA, K, CL, CO2, GLUCOSE, BUN, CREATININE, CALCIUM, MG, PHOS in the last 168 hours. Liver Function Tests: No results for input(s): AST, ALT, ALKPHOS, BILITOT, PROT, ALBUMIN in the last 168 hours. No results  for input(s): LIPASE, AMYLASE in the last 168 hours. No results for input(s): AMMONIA in the last 168 hours. CBC: No results for input(s): WBC, NEUTROABS, HGB, HCT, MCV, PLT in the last 168 hours. Cardiac Enzymes: No results for input(s): CKTOTAL, CKMB, CKMBINDEX, TROPONINI in the last 168 hours. Sepsis Labs: No results for input(s): PROCALCITON, WBC, LATICACIDVEN in the last 168 hours.  Procedures/Operations    Donald Jacque 10/03/2017, 1:39 PM

## 2017-10-19 NOTE — Progress Notes (Signed)
   Oct 18, 2017 1300  Clinical Encounter Type  Visited With Patient and family together  Visit Type Death  Referral From Nurse  Consult/Referral To Chaplain  Spiritual Encounters  Spiritual Needs Prayer;Emotional  Stress Factors  Patient Stress Factors Major life changes  Family Stress Factors Exhausted  Chaplain was present with the family as their loved on died.  Chaplain provided funeral home information to staff.

## 2017-10-19 NOTE — Progress Notes (Signed)
Pt became hypoglycemic at 1200 CBG check. 25g dextrose given. Explained to son, Mateo Flow, that with patient bring on a dextrose drip through a CVL, that the patient is likely showing signs of active death.   Approximately 15 minutes after dextrose administration, the patient became bradycardic in 30-40's. The rhythm was irregular and had the appearance of a bundle branch block. She is sustaining in this rhythm. Support provided for sons, and re-iterated that their mom was dying.   The sons declined for me to start the morphine drip, fearing it was hasten death. The patient is on a fentanyl drip at this time and is not in signs of distress. Will monitor and intervene if this changes.   Lorre Munroe

## 2017-10-19 NOTE — Progress Notes (Signed)
Patient aystole on monitor. Absent heart tones. Pupils fixed and dilated. Verified by Leanord Hawking, RN. Time of [id number].  Family at bedside. Emotional support given. Chaplain paged. MD aware.

## 2017-10-19 NOTE — Progress Notes (Signed)
PULMONARY / CRITICAL CARE MEDICINE   Name: Pam Fuller MRN: [id number] DOB: Aug 10, 1932    ADMISSION DATE:  09/11/2017 CONSULTATION DATE:  4/11  REFERRING MD:  Maudie Mercury  CHIEF COMPLAINT:  Respiratory failure  HISTORY OF PRESENT ILLNESS:   84 y/o female with a prolonged hospitalization post lap chole who had a bile leak, multiple abdominal fluid collections, aspiration pneumonia, respiratory failure and ERSD on CVVHD.  SUBJECTIVE:  Bradycardia, near arrest, atropine given and son called in but feel patient is fine, lactic acid and K rising Now partial code with no CPR/cardioversion IR can place one additional drain in a fluid collection but reluctant given overnight events  VITAL SIGNS: BP 113/76   Pulse (!) 103   Temp (!) 97.3 F (36.3 C) (Axillary)   Resp 20   Ht 5\' 2"  (1.575 m)   Wt 146 lb 9.7 oz (66.5 kg)   SpO2 (!) 88% Comment: poor perfusion and waveform  BMI 26.81 kg/m   HEMODYNAMICS: CVP:  [0 mmHg-3 mmHg] 0 mmHg  VENTILATOR SETTINGS: Vent Mode: PRVC FiO2 (%):  [30 %-60 %] 60 % Set Rate:  [14 bmp] 14 bmp Vt Set:  [400 mL] 400 mL PEEP:  [5 cmH20] 5 cmH20 Pressure Support:  [12 cmH20] 12 cmH20 Plateau Pressure:  [18 cmH20-22 cmH20] 22 cmH20  INTAKE / OUTPUT: I/O last 3 completed shifts: In: 4002.3 [I.V.:2746.2; NG/GT:395.3; IV Piggyback:860.8] Out: 1876 [Emesis/NG output:140; Drains:90; BDZHG:9924]  PHYSICAL EXAMINATION:  General:  Acutely ill appearing overnight HENT: [place]/AT, PERRL, EOM-I and DMM PULM: Coarse BS diffusely CV: RRR, Nl S1/S2 and -M/R/G. GI: No BS audible, 2 drains in place, rigid abdomen MSK: Emaciated Neuro: Sedated on vent, eye open, not withdrawing to pain  LABS:  BMET Recent Labs  Lab 09/22/17 0324 09/22/17 1600 10/02/17 0349  NA 132* 134* 133*  K 4.3 4.5 5.9*  CL 100* 101 103  CO2 24 21* 15*  BUN 23* 20 15  CREATININE 0.64 0.64 0.46  GLUCOSE 133* 193* 91   Electrolytes Recent Labs  Lab 09/21/17 0409  09/22/17 0324  09/22/17 1600 10-02-2017 0349  CALCIUM 9.1   < > 8.6* 8.8* 8.6*  MG 2.2  --  2.3  --  2.6*  PHOS 3.6   < > 3.8 4.4 5.4*   < > = values in this interval not displayed.   CBC Recent Labs  Lab 09/21/17 0409 09/22/17 0324 02-Oct-2017 0349  WBC 19.8* 21.1* 18.7*  HGB 9.7* 9.6* 10.0*  HCT 29.8* 29.9* 32.4*  PLT 50* 66* 64*   Coag's Recent Labs  Lab 09/18/17 0446 09/19/17 0355 09/20/17 0417  INR 2.12 1.90 1.88   Sepsis Markers Recent Labs  Lab 09/17/17 0905 09/18/17 0446 09/19/17 0355 2017-10-02 0501  LATICACIDVEN  --   --   --  12.5*  PROCALCITON 4.28 4.10 3.64  --    ABG Recent Labs  Lab 10/02/17 0435  PHART 7.206*  PCO2ART 27.2*  PO2ART 104   Liver Enzymes Recent Labs  Lab 09/21/17 0409  09/22/17 0324 09/22/17 1600 2017/10/02 0349  AST 226*  --  266*  --  398*  ALT 197*  --  249*  --  322*  ALKPHOS 351*  --  378*  --  414*  BILITOT 23.8*  --  24.2*  --  24.2*  ALBUMIN 1.9*   < > 1.8* 1.7* 1.7*   < > = values in this interval not displayed.   Cardiac Enzymes Recent Labs  Lab 09/17/17 [phone]  Sep 24, 2017 0501  TROPONINI 0.16* 0.08*   Glucose Recent Labs  Lab 09/22/17 1536 09/22/17 1920 09/22/17 2315 2017-09-24 0341 Sep 24, 2017 0352 09-24-2017 0723  GLUCAP 176* 159* 139* 46* 82 109*   Imaging I reviewed CXR myself, ETT is in good position  STUDIES:  CT abd/pelv 4/4 >> acute cholecystitis, gallstone in GB neck, CBD 10 mm  CT Head: no acute changes, chronic small vessel ischemic changes  CT abd/pelvis 4/8 >> small-mod fluid in abd/pelvis  Hepatobiliary scan 4/9 >> bile leak  TTE 4/12 >> EF 65-70%, G1DD, narrow LVOT, mild TR, mild pHTN  U/S Abdomen 4/14 >> small fluid near cholecystectomy site  Hepatobiliary scan 4/14 >> Rt subhepatic fluid collection consistent w/ biloma; increased in size from prior  CT abd/pelvis w/ oral contrast 4/16 >> drainage catheter at inferior liver margin with decreased perihepatic fluid; low-density fluid at left  subdiaphragmatic area, questionable colonic wall thickening throughout, multifocal airspace disease of lung bases  CT abdomen.pelvis 4/21: interval growth of large perihepatic and perisplenic fluid collection with multiple, new intraperitoneal fluid collections. Resolving multifocal pneumonia.   Echo 5/1 LVEF WNL, no other acute abnormalities, mild AS, LA midly dilated  CT abdom/pelvis 5/1 circumferential wall thickening splenic flexure likely ischemic injury to colon, fluid collections around liver decreased to unchanged, splenic infarcts, loculated fluid in anterior abdomen is smaller  CULTURES: Tracheal aspirate 4/11 >> no growth Abd fluid 4/11 >> no growth 5 days Abd fluid 4/15 >> no growth 3 days Abd fluid 4/25 > no growth 4/30 blood culture > 1/4 yeast 5/4 blood culture >   ANTIBIOTICS: Zosyn 4/3 >> 4/7 Unasyn 4/11 Zosyn 4/12 - 4/19 Zosyn 4/21-4/29 09/17/2017 meropenem > 5/2 09/17/2017 eraxis > 5/2  Eraxis 5/3 >    LINES/TUBES: ETT 4/11>4/28 OGT 4/11>4/28 PIV RUE 4/9 RLQ Biliary Drain 4/11 HD Cath LIJ 4/12 >> 4/19 L femoral trialysis catheter 4/21> RIJ cath 4/23>  Tracheostomy 4/28>  SIGNIFICANT EVENTS: 4/4 - admitted w/ acute cholecystitis 4/6 - lap chole 4/9 - bile leak seen on hepatobiliary scan 4/11 - Aspirated during prep for ERCP, intubated and transferred to ICU 4/11 - Rt abd fluid drain placed by IR 4/12- Heparin gtt started for NSTEMI; held after OG bleeding and trop peak 4/13 - worsening liver enzyymes. ESRD - Anuric. CRRT started. On levophed 30mcg and fent 188mcg 4/14 - Hgb 6.0, transfused 2 units PRBCs 4/17 - DVT LUE - started bivalirudin for suspected HIT 4/18 - CVVH d/c'ed, return to intermittent HD.  4/19 - Restarted HD. HIT negative  4/20 - Bleeding from GB drain, OGT, and stools. Received 1u pRBC. Heparin gtt held. 4/21 - Multiple, new fluid collections found on CT abd/pelvis   4/23 - ERCP with cystic duct bile leak s/p stent placement,  erosive gastritis, and CBD dilation  4/24 - Mid-abdomen perc drain placed by IR  4/26 - Coffee-ground emesis after restarting tube feeds  4/28 - Tracheostomy  4/29 - Significant bloody output from NGT with drop in Hgb 10->7 09/18/2017 CT of the abdomen > fluid collection 09/18/2017 palliative care consult placed 09/20/2017 yeast in blood culture  5/4 long family conversation with both brothers> limited code, no more procedures, continue medical support as we are doing 5/6 profound metabolic acidosis and circulatory failure   DISCUSSION:  84 y/o female with ESRD on HD, who is here with anemia, breast cancer here with acute cholecystitis.  S/p lap chole complicated by bile leak.  Prolonged ICU stay.  Developed fungemia 5/1 on eraxis  ASSESSMENT / PLAN:  PULMONARY A: Acute respiratory failure with hypoxemia Aspiration pneumonia> treated Tracheostomy in place P:   Full vent support Hold PS trials Trach care per routine Titrate O2 for sat of 88-92%  CARDIOVASCULAR A:  Shock: multi-factorial, most recently related to adrenal insufficiency> improved, minimal levophed requirement 5/5 NSTEMI (demand ischemia) Prolonged QTc LUE DVT P:  Tele Continue stress dose steroids Continue levophed as needed for MAP > 65 Vasopressin 0.03  RENAL A:   ESRD on HD baseline AV fistula in LUE Hyperkalemia P:   Continue CVVHD Monitor/replace electrolytes Bicarb drip Bicarb pre and post CRRT to address hyperkalemia  GASTROINTESTINAL A:   Severe protein calorie malnutrition Acute cholecystitis s/p lap chole 4/6 Bile leak s/p biliary stent via ERCP Biloma Erosive gastritis noted on ERCP 4/23 Hyper bilirubinemia likely due to intrahepatic cholestasis from TPN Ischemic change in colon (splenic flexure) Intermittent upper GI bleeding while on heparin P:   Hold TPN for fungemia Hold TF given high chance of necrotic bowel at this point given overnight events Continue PPI  HEMATOLOGIC A:    Anemia, no bleeding  Thrombocytopenia > worsening L IJ and upper extremity clot P:  Monitor for bleeding Holding heparin as she has had intermittent gi bleeding on this  INFECTIOUS A:   Peritonitis Aspiration pneumonia s/p treatment Biloma/intraperitoneal abscess Multiple drains placed in abscesses/biloma (intrahepatic) Fungemia P:   Continue antifungals No antibacterial  ENDOCRINE A:   Intermittent hypoglycemia P:   Monitor glucose  NEUROLOGIC A:   Deconditioning Pain P:   RASS goal 0 to -1  Fentanyl infusion for comfort  FAMILY  - Updates: Spoke with son at length, explained that patient is essentially dying at this point.  He is reluctant to make the decision to comfort care and is consulting with the rest of the family.  They are to recontact me after their discussion.  The patient is critically ill with multiple organ systems failure and requires high complexity decision making for assessment and support, frequent evaluation and titration of therapies, application of advanced monitoring technologies and extensive interpretation of multiple databases.   Critical Care Time devoted to patient care services described in this note is  51  Minutes. This time reflects time of care of this signee Dr Jennet Maduro. This critical care time does not reflect procedure time, or teaching time or supervisory time of PA/NP/Med student/Med Resident etc but could involve care discussion time.  Rush Farmer, M.D. [facility] Pulmonary/Critical Care Medicine. Pager: [phone]. After hours pager: [phone].  Sep 26, 2017, 8:52 AM

## 2017-10-19 DEATH — deceased

## 2019-07-12 IMAGING — DX DG CHEST 1V PORT
1 series · 1 of 1 positions shown · non-contrast
Comparison: Chest x-rays dated 08/31/2017 and 08/30/2017.

CLINICAL DATA: Endotracheal tube present

EXAM:
PORTABLE CHEST 1 VIEW

[chest]
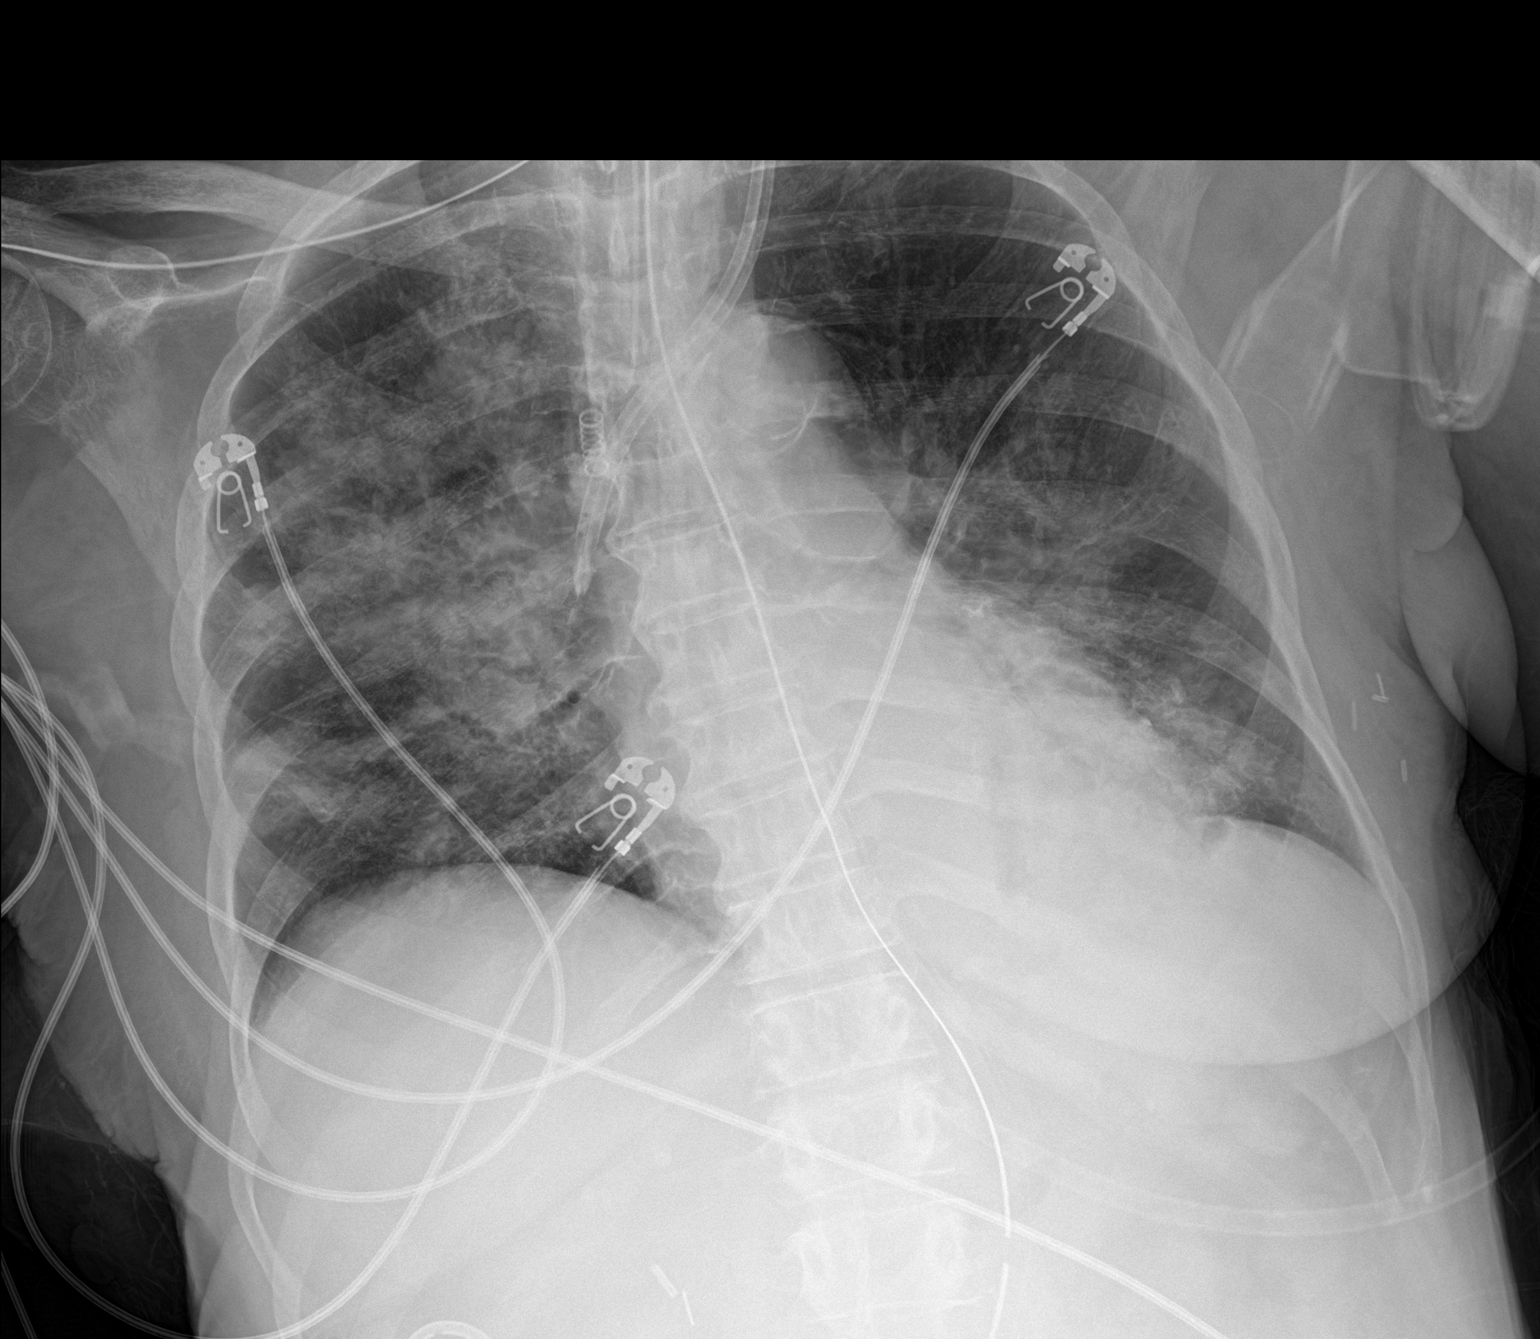

[1 of 1 positions shown; findings below may reference images not displayed]

FINDINGS: Endotracheal tube remains well positioned with tip approximately 2-3
cm above the carina. LEFT IJ central line is stable in position with
tip at the level of the mid SVC. Enteric tube passes below the
diaphragm.

The bilateral airspace opacities are not significantly changed
compared to yesterday's exam, improved compared to the earlier study
of 08/30/2017. No new lung findings. No pneumothorax seen.
IMPRESSION: 1. Bilateral airspace opacities, pulmonary edema versus pneumonia,
not significantly changed compared to yesterday's exam, but improved
compared to the earlier study of 08/30/2017.
2. Support apparatus remains appropriately positioned.

## 2019-07-12 IMAGING — NM NM HEPATOBILIARY IMAGE, INC GB
2 series · 12 of 12 positions shown · non-contrast
Comparison: Prior hepatobiliary imaging on 08/27/2017

CLINICAL DATA: Prior cholecystectomy. Postop bile leak. Previous
percutaneous drainage catheter placement. Evaluate for persistent
bile leak.

EXAM:
NUCLEAR MEDICINE HEPATOBILIARY IMAGING
TECHNIQUE: Sequential images of the abdomen were obtained [DATE] minutes
following intravenous administration of radiopharmaceutical.
RADIOPHARMACEUTICALS:  5.5 mCi 6c-22m  Choletec IV

[he hepatobiliary · 3.10mm/px · 6 of 53 frames shown (1 of 2)]
[frame 5/53]
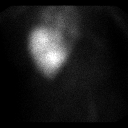
[frame 13/53]
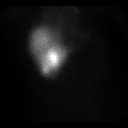
[frame 22/53]
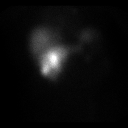
[frame 31/53]
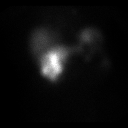
[frame 40/53]
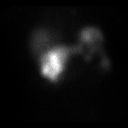
[frame 49/53]
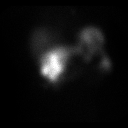

[he hepatobiliary · 3.10mm/px · 6 of 14 frames shown (2 of 2)]
[frame 2/14]
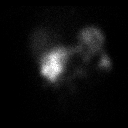
[frame 4/14]
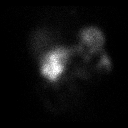
[frame 6/14]
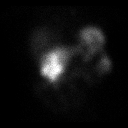
[frame 9/14]
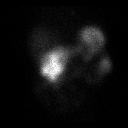
[frame 11/14]
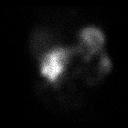
[frame 13/14]
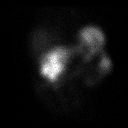

[12 of 12 positions shown; findings below may reference images not displayed]

FINDINGS: Prompt uptake and excretion of activity by the liver is seen.
Gallbladder activity is absent, consistent with prior
cholecystectomy. Biliary activity initially accumulates and persists
in a rounded collection in the right subhepatic space, which appears
increased in size compared to previous study. There is no evidence
of free intraperitoneal leakage of biliary activity. Subsequent
images show passage of some biliary contrast into the small bowel.
IMPRESSION: Excreted biliary activity accumulates in a right subhepatic fluid
collection, consistent with biloma. This appears increased in size
compared to previous study. Consider abdomen pelvis CT for further
evaluation.
